# Patient Record
Sex: Male | Born: 1955 | Race: White | Hispanic: No | Smoking: Former smoker
Health system: Southern US, Community
[De-identification: ages and names within clinical notes are randomized; demographics above are authoritative.]

## PROBLEM LIST (undated history)

## (undated) DIAGNOSIS — F419 Anxiety disorder, unspecified: Secondary | ICD-10-CM

## (undated) DIAGNOSIS — I10 Essential (primary) hypertension: Secondary | ICD-10-CM

## (undated) DIAGNOSIS — I82409 Acute embolism and thrombosis of unspecified deep veins of unspecified lower extremity: Secondary | ICD-10-CM

## (undated) DIAGNOSIS — J439 Emphysema, unspecified: Secondary | ICD-10-CM

## (undated) DIAGNOSIS — D696 Thrombocytopenia, unspecified: Secondary | ICD-10-CM

## (undated) DIAGNOSIS — E119 Type 2 diabetes mellitus without complications: Secondary | ICD-10-CM

## (undated) DIAGNOSIS — G629 Polyneuropathy, unspecified: Secondary | ICD-10-CM

## (undated) DIAGNOSIS — R600 Localized edema: Secondary | ICD-10-CM

## (undated) DIAGNOSIS — J449 Chronic obstructive pulmonary disease, unspecified: Secondary | ICD-10-CM

## (undated) DIAGNOSIS — M1990 Unspecified osteoarthritis, unspecified site: Secondary | ICD-10-CM

## (undated) DIAGNOSIS — K529 Noninfective gastroenteritis and colitis, unspecified: Secondary | ICD-10-CM

## (undated) DIAGNOSIS — S5292XA Unspecified fracture of left forearm, initial encounter for closed fracture: Secondary | ICD-10-CM

## (undated) DIAGNOSIS — R918 Other nonspecific abnormal finding of lung field: Secondary | ICD-10-CM

## (undated) DIAGNOSIS — R5383 Other fatigue: Secondary | ICD-10-CM

## (undated) DIAGNOSIS — J40 Bronchitis, not specified as acute or chronic: Secondary | ICD-10-CM

## (undated) HISTORY — DX: Acute embolism and thrombosis of unspecified deep veins of unspecified lower extremity: I82.409

## (undated) HISTORY — DX: Essential (primary) hypertension: I10

## (undated) HISTORY — DX: Type 2 diabetes mellitus without complications: E11.9

## (undated) HISTORY — PX: MOUTH SURGERY: SHX715

## (undated) HISTORY — DX: Other fatigue: R53.83

## (undated) HISTORY — DX: Emphysema, unspecified: J43.9

## (undated) HISTORY — DX: Localized edema: R60.0

## (undated) HISTORY — PX: CHOLECYSTECTOMY: SHX55

## (undated) HISTORY — DX: Thrombocytopenia, unspecified: D69.6

## (undated) HISTORY — DX: Other nonspecific abnormal finding of lung field: R91.8

---

## 2000-11-25 ENCOUNTER — Encounter: Payer: Self-pay | Admitting: Family Medicine

## 2000-11-25 ENCOUNTER — Ambulatory Visit (HOSPITAL_COMMUNITY): Admission: RE | Admit: 2000-11-25 | Discharge: 2000-11-25 | Payer: Self-pay | Admitting: Family Medicine

## 2004-07-18 ENCOUNTER — Ambulatory Visit (HOSPITAL_COMMUNITY): Admission: RE | Admit: 2004-07-18 | Discharge: 2004-07-18 | Payer: Self-pay | Admitting: Family Medicine

## 2006-07-24 ENCOUNTER — Inpatient Hospital Stay (HOSPITAL_COMMUNITY): Admission: RE | Admit: 2006-07-24 | Discharge: 2006-07-25 | Payer: Self-pay | Admitting: Family Medicine

## 2006-07-24 ENCOUNTER — Ambulatory Visit: Payer: Self-pay | Admitting: Gastroenterology

## 2006-07-25 ENCOUNTER — Encounter (INDEPENDENT_AMBULATORY_CARE_PROVIDER_SITE_OTHER): Payer: Self-pay | Admitting: General Surgery

## 2007-07-04 ENCOUNTER — Inpatient Hospital Stay (HOSPITAL_COMMUNITY): Admission: EM | Admit: 2007-07-04 | Discharge: 2007-07-07 | Payer: Self-pay | Admitting: Emergency Medicine

## 2007-07-05 ENCOUNTER — Ambulatory Visit: Payer: Self-pay | Admitting: Internal Medicine

## 2008-11-25 ENCOUNTER — Ambulatory Visit (HOSPITAL_COMMUNITY): Admission: RE | Admit: 2008-11-25 | Discharge: 2008-11-25 | Payer: Self-pay | Admitting: Family Medicine

## 2009-02-18 ENCOUNTER — Ambulatory Visit (HOSPITAL_COMMUNITY): Admission: RE | Admit: 2009-02-18 | Discharge: 2009-02-18 | Payer: Self-pay | Admitting: General Surgery

## 2009-02-24 ENCOUNTER — Ambulatory Visit (HOSPITAL_COMMUNITY): Admission: RE | Admit: 2009-02-24 | Discharge: 2009-02-24 | Payer: Self-pay | Admitting: Internal Medicine

## 2009-02-24 ENCOUNTER — Inpatient Hospital Stay (HOSPITAL_COMMUNITY): Admission: EM | Admit: 2009-02-24 | Discharge: 2009-02-25 | Payer: Self-pay | Admitting: Emergency Medicine

## 2009-06-18 ENCOUNTER — Emergency Department (HOSPITAL_COMMUNITY): Admission: EM | Admit: 2009-06-18 | Discharge: 2009-06-18 | Payer: Self-pay | Admitting: Emergency Medicine

## 2009-08-02 ENCOUNTER — Encounter (HOSPITAL_COMMUNITY): Admission: RE | Admit: 2009-08-02 | Discharge: 2009-09-01 | Payer: Self-pay | Admitting: Oncology

## 2009-08-02 ENCOUNTER — Ambulatory Visit (HOSPITAL_COMMUNITY): Payer: Self-pay | Admitting: Oncology

## 2009-09-12 ENCOUNTER — Encounter (HOSPITAL_COMMUNITY): Admission: RE | Admit: 2009-09-12 | Discharge: 2009-10-12 | Payer: Self-pay | Admitting: Oncology

## 2009-09-19 ENCOUNTER — Ambulatory Visit (HOSPITAL_COMMUNITY): Payer: Self-pay | Admitting: Oncology

## 2009-10-12 ENCOUNTER — Encounter (HOSPITAL_COMMUNITY): Admission: RE | Admit: 2009-10-12 | Discharge: 2009-11-11 | Payer: Self-pay | Admitting: Oncology

## 2009-11-15 ENCOUNTER — Encounter (HOSPITAL_COMMUNITY): Admission: RE | Admit: 2009-11-15 | Discharge: 2009-12-02 | Payer: Self-pay | Admitting: Oncology

## 2009-11-15 ENCOUNTER — Ambulatory Visit (HOSPITAL_COMMUNITY): Payer: Self-pay | Admitting: Oncology

## 2009-12-05 ENCOUNTER — Encounter (HOSPITAL_COMMUNITY)
Admission: RE | Admit: 2009-12-05 | Discharge: 2010-01-04 | Payer: Self-pay | Source: Home / Self Care | Admitting: Oncology

## 2010-01-02 ENCOUNTER — Ambulatory Visit (HOSPITAL_COMMUNITY): Payer: Self-pay | Admitting: Oncology

## 2010-01-06 ENCOUNTER — Encounter (HOSPITAL_COMMUNITY)
Admission: RE | Admit: 2010-01-06 | Discharge: 2010-02-05 | Payer: Self-pay | Source: Home / Self Care | Admitting: Oncology

## 2010-02-07 ENCOUNTER — Encounter (HOSPITAL_COMMUNITY)
Admission: RE | Admit: 2010-02-07 | Discharge: 2010-03-09 | Payer: Self-pay | Source: Home / Self Care | Attending: Oncology | Admitting: Oncology

## 2010-03-14 ENCOUNTER — Ambulatory Visit (HOSPITAL_COMMUNITY)
Admission: RE | Admit: 2010-03-14 | Discharge: 2010-04-04 | Payer: Self-pay | Source: Home / Self Care | Attending: Oncology | Admitting: Oncology

## 2010-03-14 ENCOUNTER — Encounter (HOSPITAL_COMMUNITY)
Admission: RE | Admit: 2010-03-14 | Discharge: 2010-04-04 | Payer: Self-pay | Source: Home / Self Care | Attending: Oncology | Admitting: Oncology

## 2010-03-20 LAB — PROTIME-INR
INR: 1.5 — ABNORMAL HIGH (ref 0.00–1.49)
Prothrombin Time: 18.3 seconds — ABNORMAL HIGH (ref 11.6–15.2)

## 2010-03-26 ENCOUNTER — Encounter (HOSPITAL_COMMUNITY): Payer: Self-pay | Admitting: Oncology

## 2010-04-04 LAB — PROTIME-INR
INR: 2.66 — ABNORMAL HIGH (ref 0.00–1.49)
Prothrombin Time: 28.4 seconds — ABNORMAL HIGH (ref 11.6–15.2)

## 2010-04-05 ENCOUNTER — Encounter (HOSPITAL_COMMUNITY): Payer: Self-pay | Admitting: Oncology

## 2010-04-25 ENCOUNTER — Encounter (HOSPITAL_COMMUNITY): Payer: Medicaid Other | Attending: Oncology

## 2010-04-25 ENCOUNTER — Other Ambulatory Visit (HOSPITAL_COMMUNITY): Payer: Medicaid Other

## 2010-04-25 DIAGNOSIS — Z7901 Long term (current) use of anticoagulants: Secondary | ICD-10-CM | POA: Insufficient documentation

## 2010-04-25 DIAGNOSIS — Z86718 Personal history of other venous thrombosis and embolism: Secondary | ICD-10-CM | POA: Insufficient documentation

## 2010-04-25 DIAGNOSIS — I82409 Acute embolism and thrombosis of unspecified deep veins of unspecified lower extremity: Secondary | ICD-10-CM

## 2010-05-16 LAB — PROTIME-INR
INR: 2.01 — ABNORMAL HIGH (ref 0.00–1.49)
INR: 2.08 — ABNORMAL HIGH (ref 0.00–1.49)
INR: 2.53 — ABNORMAL HIGH (ref 0.00–1.49)
INR: 2.73 — ABNORMAL HIGH (ref 0.00–1.49)
INR: 3.18 — ABNORMAL HIGH (ref 0.00–1.49)
Prothrombin Time: 22.9 seconds — ABNORMAL HIGH (ref 11.6–15.2)
Prothrombin Time: 23.5 seconds — ABNORMAL HIGH (ref 11.6–15.2)
Prothrombin Time: 27.4 seconds — ABNORMAL HIGH (ref 11.6–15.2)
Prothrombin Time: 29 seconds — ABNORMAL HIGH (ref 11.6–15.2)
Prothrombin Time: 32.6 seconds — ABNORMAL HIGH (ref 11.6–15.2)

## 2010-05-17 LAB — PROTIME-INR
INR: 1.82 — ABNORMAL HIGH (ref 0.00–1.49)
INR: 2.5 — ABNORMAL HIGH (ref 0.00–1.49)
INR: 3.64 — ABNORMAL HIGH (ref 0.00–1.49)
Prothrombin Time: 21.2 seconds — ABNORMAL HIGH (ref 11.6–15.2)
Prothrombin Time: 27.1 seconds — ABNORMAL HIGH (ref 11.6–15.2)
Prothrombin Time: 36.2 seconds — ABNORMAL HIGH (ref 11.6–15.2)

## 2010-05-18 LAB — COMPREHENSIVE METABOLIC PANEL
ALT: 17 U/L (ref 0–53)
Albumin: 3.3 g/dL — ABNORMAL LOW (ref 3.5–5.2)
Alkaline Phosphatase: 40 U/L (ref 39–117)
Potassium: 3.7 mEq/L (ref 3.5–5.1)
Sodium: 135 mEq/L (ref 135–145)
Total Protein: 7 g/dL (ref 6.0–8.3)

## 2010-05-18 LAB — PROTIME-INR
INR: 1.55 — ABNORMAL HIGH (ref 0.00–1.49)
INR: 1.62 — ABNORMAL HIGH (ref 0.00–1.49)
INR: 2.46 — ABNORMAL HIGH (ref 0.00–1.49)
Prothrombin Time: 18.8 seconds — ABNORMAL HIGH (ref 11.6–15.2)
Prothrombin Time: 19.4 seconds — ABNORMAL HIGH (ref 11.6–15.2)
Prothrombin Time: 34.8 seconds — ABNORMAL HIGH (ref 11.6–15.2)

## 2010-05-18 LAB — CBC
HCT: 43.7 % (ref 39.0–52.0)
Platelets: 176 10*3/uL (ref 150–400)
RDW: 13.8 % (ref 11.5–15.5)
WBC: 7.1 10*3/uL (ref 4.0–10.5)

## 2010-05-19 LAB — PROTIME-INR
INR: 2.94 — ABNORMAL HIGH (ref 0.00–1.49)
Prothrombin Time: 30.7 seconds — ABNORMAL HIGH (ref 11.6–15.2)

## 2010-05-20 LAB — PROTIME-INR: INR: 1.74 — ABNORMAL HIGH (ref 0.00–1.49)

## 2010-05-21 LAB — PROTIME-INR
INR: 4.03 — ABNORMAL HIGH (ref 0.00–1.49)
Prothrombin Time: 25.3 seconds — ABNORMAL HIGH (ref 11.6–15.2)

## 2010-05-22 ENCOUNTER — Other Ambulatory Visit (HOSPITAL_COMMUNITY): Payer: Medicaid Other

## 2010-05-22 ENCOUNTER — Encounter (HOSPITAL_COMMUNITY): Payer: Medicaid Other | Attending: Oncology

## 2010-05-22 DIAGNOSIS — I82409 Acute embolism and thrombosis of unspecified deep veins of unspecified lower extremity: Secondary | ICD-10-CM

## 2010-05-22 DIAGNOSIS — Z86718 Personal history of other venous thrombosis and embolism: Secondary | ICD-10-CM | POA: Insufficient documentation

## 2010-05-22 DIAGNOSIS — Z7901 Long term (current) use of anticoagulants: Secondary | ICD-10-CM | POA: Insufficient documentation

## 2010-05-22 LAB — LUPUS ANTICOAGULANT PANEL
DRVVT: 58.8 secs — ABNORMAL HIGH (ref 36.2–44.3)
Lupus Anticoagulant: NOT DETECTED

## 2010-05-22 LAB — CARDIOLIPIN ANTIBODIES, IGG, IGM, IGA
Anticardiolipin IgA: 10 APL U/mL — ABNORMAL LOW (ref <22)
Anticardiolipin IgG: 4 GPL U/mL — ABNORMAL LOW (ref <23)

## 2010-05-22 LAB — DIFFERENTIAL
Eosinophils Absolute: 0.1 10*3/uL (ref 0.0–0.7)
Lymphocytes Relative: 20 % (ref 12–46)
Lymphs Abs: 2.9 10*3/uL (ref 0.7–4.0)
Monocytes Relative: 5 % (ref 3–12)
Neutrophils Relative %: 73 % (ref 43–77)

## 2010-05-22 LAB — BETA-2-GLYCOPROTEIN I ABS, IGG/M/A: Beta-2 Glyco I IgG: 0 G Units (ref <20)

## 2010-05-22 LAB — PROTIME-INR: Prothrombin Time: 20.6 seconds — ABNORMAL HIGH (ref 11.6–15.2)

## 2010-05-22 LAB — RETICULOCYTES
RBC.: 4.02 MIL/uL — ABNORMAL LOW (ref 4.22–5.81)
Retic Ct Pct: 2.4 % (ref 0.4–3.1)

## 2010-05-22 LAB — CBC
HCT: 44.1 % (ref 39.0–52.0)
MCV: 109.7 fL — ABNORMAL HIGH (ref 78.0–100.0)
RBC: 4.02 MIL/uL — ABNORMAL LOW (ref 4.22–5.81)
WBC: 14.5 10*3/uL — ABNORMAL HIGH (ref 4.0–10.5)

## 2010-05-22 LAB — IRON AND TIBC
Iron: 116 ug/dL (ref 42–135)
Saturation Ratios: 36 % (ref 20–55)
UIBC: 207 ug/dL

## 2010-05-22 LAB — FERRITIN: Ferritin: 455 ng/mL — ABNORMAL HIGH (ref 22–322)

## 2010-05-22 LAB — PROTEIN S, TOTAL: Protein S Ag, Total: 87 % (ref 70–140)

## 2010-05-22 LAB — PROTEIN C, TOTAL: Protein C, Total: 63 % — ABNORMAL LOW (ref 70–140)

## 2010-05-24 ENCOUNTER — Encounter (HOSPITAL_COMMUNITY): Payer: Medicaid Other

## 2010-05-24 ENCOUNTER — Other Ambulatory Visit (HOSPITAL_COMMUNITY): Payer: Medicaid Other

## 2010-05-24 DIAGNOSIS — I82409 Acute embolism and thrombosis of unspecified deep veins of unspecified lower extremity: Secondary | ICD-10-CM

## 2010-05-30 ENCOUNTER — Other Ambulatory Visit (HOSPITAL_COMMUNITY): Payer: Self-pay | Admitting: Oncology

## 2010-05-30 ENCOUNTER — Encounter (HOSPITAL_COMMUNITY): Payer: Medicaid Other

## 2010-05-30 ENCOUNTER — Ambulatory Visit (HOSPITAL_COMMUNITY): Payer: Medicaid Other | Admitting: Oncology

## 2010-05-30 ENCOUNTER — Other Ambulatory Visit (HOSPITAL_COMMUNITY): Payer: Medicaid Other

## 2010-05-30 DIAGNOSIS — I82409 Acute embolism and thrombosis of unspecified deep veins of unspecified lower extremity: Secondary | ICD-10-CM

## 2010-05-30 DIAGNOSIS — R918 Other nonspecific abnormal finding of lung field: Secondary | ICD-10-CM

## 2010-06-05 ENCOUNTER — Encounter (HOSPITAL_COMMUNITY): Payer: Medicaid Other

## 2010-06-05 ENCOUNTER — Ambulatory Visit (HOSPITAL_COMMUNITY)
Admission: RE | Admit: 2010-06-05 | Discharge: 2010-06-05 | Disposition: A | Discharge status: Home / Self Care | Payer: Medicaid Other | Source: Ambulatory Visit | Attending: Oncology | Admitting: Oncology

## 2010-06-05 ENCOUNTER — Encounter (HOSPITAL_COMMUNITY): Payer: Medicaid Other | Attending: Oncology

## 2010-06-05 DIAGNOSIS — R918 Other nonspecific abnormal finding of lung field: Secondary | ICD-10-CM

## 2010-06-05 DIAGNOSIS — I82409 Acute embolism and thrombosis of unspecified deep veins of unspecified lower extremity: Secondary | ICD-10-CM

## 2010-06-05 DIAGNOSIS — F17200 Nicotine dependence, unspecified, uncomplicated: Secondary | ICD-10-CM | POA: Insufficient documentation

## 2010-06-05 DIAGNOSIS — Z86718 Personal history of other venous thrombosis and embolism: Secondary | ICD-10-CM | POA: Insufficient documentation

## 2010-06-05 DIAGNOSIS — J984 Other disorders of lung: Secondary | ICD-10-CM | POA: Insufficient documentation

## 2010-06-05 DIAGNOSIS — Z7901 Long term (current) use of anticoagulants: Secondary | ICD-10-CM | POA: Insufficient documentation

## 2010-06-05 LAB — DIFFERENTIAL
Basophils Absolute: 0 10*3/uL (ref 0.0–0.1)
Basophils Relative: 0 % (ref 0–1)
Basophils Relative: 1 % (ref 0–1)
Eosinophils Relative: 2 % (ref 0–5)
Lymphocytes Relative: 23 % (ref 12–46)
Lymphs Abs: 2.7 10*3/uL (ref 0.7–4.0)
Monocytes Absolute: 0.5 10*3/uL (ref 0.1–1.0)
Monocytes Relative: 6 % (ref 3–12)
Monocytes Relative: 6 % (ref 3–12)
Neutro Abs: 5.1 10*3/uL (ref 1.7–7.7)
Neutro Abs: 5.1 10*3/uL (ref 1.7–7.7)
Neutrophils Relative %: 68 % (ref 43–77)
Neutrophils Relative %: 72 % (ref 43–77)

## 2010-06-05 LAB — CBC
HCT: 36.8 % — ABNORMAL LOW (ref 39.0–52.0)
Hemoglobin: 15.3 g/dL (ref 13.0–17.0)
MCHC: 32.9 g/dL (ref 30.0–36.0)
Platelets: 125 10*3/uL — ABNORMAL LOW (ref 150–400)
RBC: 4.34 MIL/uL (ref 4.22–5.81)
RBC: 3.6 MIL/uL — ABNORMAL LOW (ref 4.22–5.81)
RDW: 17.5 % — ABNORMAL HIGH (ref 11.5–15.5)
WBC: 7.5 10*3/uL (ref 4.0–10.5)
WBC: 9.1 10*3/uL (ref 4.0–10.5)

## 2010-06-05 LAB — BASIC METABOLIC PANEL
Calcium: 8.8 mg/dL (ref 8.4–10.5)
Chloride: 97 mEq/L (ref 96–112)
Creatinine, Ser: 0.81 mg/dL (ref 0.4–1.5)
GFR calc Af Amer: >60 mL/min (ref >60)

## 2010-06-05 LAB — STOOL CULTURE

## 2010-06-05 LAB — FECAL LACTOFERRIN, QUANT

## 2010-06-05 LAB — COMPREHENSIVE METABOLIC PANEL
AST: 28 U/L (ref 0–37)
Albumin: 3 g/dL — ABNORMAL LOW (ref 3.5–5.2)
Albumin: 2.8 g/dL — ABNORMAL LOW (ref 3.5–5.2)
Alkaline Phosphatase: 39 U/L (ref 39–117)
Alkaline Phosphatase: 36 U/L — ABNORMAL LOW (ref 39–117)
BUN: 5 mg/dL — ABNORMAL LOW (ref 6–23)
BUN: 6 mg/dL (ref 6–23)
GFR calc Af Amer: >60 mL/min (ref >60)
Potassium: 4.1 mEq/L (ref 3.5–5.1)
Potassium: 4 mEq/L (ref 3.5–5.1)
Sodium: 133 mEq/L — ABNORMAL LOW (ref 135–145)
Sodium: 139 mEq/L (ref 135–145)
Total Protein: 6.7 g/dL (ref 6.0–8.3)
Total Protein: 6.1 g/dL (ref 6.0–8.3)

## 2010-06-05 LAB — APTT
aPTT: 29 seconds (ref 24–37)
aPTT: 35 seconds (ref 24–37)

## 2010-06-05 LAB — PROTIME-INR
INR: 2.37 — ABNORMAL HIGH (ref 0.00–1.49)
INR: 0.95 (ref 0.00–1.49)
INR: 0.97 (ref 0.00–1.49)
Prothrombin Time: 26 seconds — ABNORMAL HIGH (ref 11.6–15.2)

## 2010-06-05 LAB — CLOSTRIDIUM DIFFICILE EIA

## 2010-06-05 LAB — IRON AND TIBC: UIBC: 239 ug/dL

## 2010-06-05 LAB — OVA AND PARASITE EXAMINATION

## 2010-06-08 ENCOUNTER — Other Ambulatory Visit (HOSPITAL_COMMUNITY): Payer: Self-pay | Admitting: Oncology

## 2010-06-08 DIAGNOSIS — F17200 Nicotine dependence, unspecified, uncomplicated: Secondary | ICD-10-CM

## 2010-06-08 DIAGNOSIS — R911 Solitary pulmonary nodule: Secondary | ICD-10-CM

## 2010-06-16 ENCOUNTER — Encounter (HOSPITAL_COMMUNITY): Payer: Medicaid Other

## 2010-06-16 DIAGNOSIS — I82409 Acute embolism and thrombosis of unspecified deep veins of unspecified lower extremity: Secondary | ICD-10-CM

## 2010-07-04 ENCOUNTER — Encounter (HOSPITAL_COMMUNITY): Payer: Medicaid Other | Attending: Oncology

## 2010-07-04 ENCOUNTER — Encounter (HOSPITAL_COMMUNITY): Payer: Medicaid Other | Attending: Oncology | Admitting: Oncology

## 2010-07-04 DIAGNOSIS — R911 Solitary pulmonary nodule: Secondary | ICD-10-CM

## 2010-07-04 DIAGNOSIS — Z7901 Long term (current) use of anticoagulants: Secondary | ICD-10-CM | POA: Insufficient documentation

## 2010-07-04 DIAGNOSIS — Z86718 Personal history of other venous thrombosis and embolism: Secondary | ICD-10-CM | POA: Insufficient documentation

## 2010-07-04 DIAGNOSIS — I82409 Acute embolism and thrombosis of unspecified deep veins of unspecified lower extremity: Secondary | ICD-10-CM

## 2010-07-04 DIAGNOSIS — J449 Chronic obstructive pulmonary disease, unspecified: Secondary | ICD-10-CM

## 2010-07-18 ENCOUNTER — Other Ambulatory Visit (HOSPITAL_COMMUNITY): Payer: Self-pay | Admitting: Oncology

## 2010-07-18 ENCOUNTER — Encounter (HOSPITAL_COMMUNITY): Payer: Medicaid Other

## 2010-07-18 DIAGNOSIS — I82409 Acute embolism and thrombosis of unspecified deep veins of unspecified lower extremity: Secondary | ICD-10-CM

## 2010-07-18 LAB — PROTIME-INR: INR: 2.78 — ABNORMAL HIGH (ref 0.00–1.49)

## 2010-07-18 NOTE — Op Note (Signed)
NAME:  Wesley Ho, Wesley Ho NO.:  [id number]   MEDICAL RECORD NO.:  [id number]          PATIENT TYPE:  INP   LOCATION:  A327                          FACILITY:  [facility]   PHYSICIAN:  Dalia Heading, M.D.  DATE OF BIRTH:  06/03/1955   DATE OF PROCEDURE:  DATE OF DISCHARGE:                               OPERATIVE REPORT   PREOPERATIVE DIAGNOSIS:  Cholecystitis, cholelithiasis.   POSTOPERATIVE DIAGNOSIS:  Cholecystitis, cholelithiasis,  choledocholithiasis.   OPERATION PERFORMED:  Laparoscopic cholecystectomy with common bile duct  exploration.   SURGEON:  Dalia Heading, M.D.   ANESTHESIA:  General endotracheal.   INDICATIONS:  The patient is a 55 year old white male who presents with  cholecystitis secondary to cholelithiasis.  He was also noted to have  elevated liver enzyme testes.  The risks and benefits of the procedure  including, bleeding, infection, hepatobiliary injury, the possibility of  an open procedure were fully explained to the patient.  He gave informed  consent.   PROCEDURE:  The patient was placed in supine position.  After induction  of general endotracheal anesthesia, the abdomen was prepped and draped  using the usual sterile technique with  Betadine.  Surgical site  confirmation was performed.   A supraumbilical incision was made down to the fascia.  A Veress needle  was introduced into the abdominal cavity and confirmation of placement  was done using the saline drop test.  The abdomen was then insufflated  to 16 mmHg pressure.  An 11 mm trocar was introduced into the abdominal  cavity under direct visualization without difficulty.  The patient was  placed in reverse Trendelenburg position and additional 11 mm trocar was  placed to the epigastric region. 5 mm trocars were placed in the right  upper quadrant, right flank regions.  The liver was inspected and noted  to be within normal limits.  The gallbladder was retracted superiorly  and  laterally. The dissection was begun around the infundibulum of the  gallbladder.  The cystic duct was first identified.  The junction to the  infundibulum was fully identified.  A single endo cup was placed  proximally on the cystic duct.  An incision was made into the cystic  duct and a cholangiogram was performed under digital fluoroscopy.  The  patient was noted to have a distal common bile duct filling defect.  Laparoscopically, a basket was placed multiple times past this region.  Completion cholangiograms reveals the filling defect to be gone.  Dye  flowed freely into the duodenum.  The system was flushed with saline and  the cholangio catheter removed.  Multiple endoclips were placed distally  on the cystic duct. The cystic duct was divided.  The cystic artery was  likewise ligated and divided.  The gallbladder was freed away from the  gallbladder fossa using Bovie electrocautery.  The gallbladder was  delivered through the upper gastric trocar site using EndoCatch bag.  The gallbladder fossa was inspected and no abnormal bleeding or bile  leakage was noted.  Surgicel was placed in the gallbladder fossa.  All  fluid and  air were then evacuated from the abdominal cavity prior to  removal of the trocars.  All wounds were irrigated with saline.  All  wounds were injected with 0.5% Sensorcaine.  The supraumbilical fascia  as well as epigastric fascia was reapparoximated using 0-Vicryl  interrupted sutures.  All skin incisions were closed using staples.  Betadine ointment dry sterile dressings were applied.  All tape and  needle counts were correct at the end of the procedure.  The patient was  extubated in the operating room and went back to recovery room awake and  in stable condition.   COMPLICATIONS:  None.   SPECIMENS:  Gallbladder with stones.   ESTIMATED BLOOD LOSS:  Minimal.      Dalia Heading, M.D.  Electronically Signed     MAJ/MEDQ  D:  07/25/2006  T:   07/25/2006  Job:  [id number]   cc:   Dalia Heading, M.D.  Fax: [phone]   Kassie Mends, M.D.  [address]  [place] , [place] [address]   Patrica Duel, M.D.  Fax: [phone]

## 2010-07-18 NOTE — Discharge Summary (Signed)
NAMENICOLI, NARDOZZI NO.:  [id number]   MEDICAL RECORD NO.:  [id number]          PATIENT TYPE:  INP   LOCATION:  A202                          FACILITY:  [facility]   PHYSICIAN:  Dorris Singh, DO    DATE OF BIRTH:  [birth year]   DATE OF ADMISSION:  07/04/2007  DATE OF DISCHARGE:  05/04/2009LH                               DISCHARGE SUMMARY   ADMISSION DIAGNOSES:  1. Abdominal pain.  2. Acute pancreatitis.  3. Leukocytosis.  4. Hyperglycemia.   DISCHARGE DIAGNOSES:  1. Acute pancreatitis.  2. Leukocytosis.   PRIMARY CARE PHYSICIAN:  Patrica Duel, M.D.   CONSULTS THAT WERE MADE:  [facility], Dr.  Jena Gauss.   TESTING THAT WAS DONE:  1. On Jul 04, 2007, he had a CT of the abdomen which demonstrated:  1.      Suspect acute pancreatitis involving the head and uncinate process      of the pancreas, no evidence of pancreatic sclerosis, pseudocyst      formation, or abscess.  2.  Status post cholecystectomy.  3.      Pulmonary nodule in the right lung.  Follow-up CT of the chest as      recommended in 3 months.  2. He also had a CT of the pelvis, which demonstrated no acute      findings.   His H&P was done by Dr. Skeet Latch.  Please refer.  To summarize,  the patient is a 55 year old Caucasian male who presented with abdominal  pain.  He was found to have pancreatitis.  He was admitted to the  service of [facility]. The patient was made n.p.o., given IV hydration as  well as IV antibiotics.  He continued to progress.  He was also placed  on pain management.  The patient continued to do well after 24 hours of  this therapy.  At that point in time, it was determined that he could  probably try some clear liquids.  He advanced with clear liquids without  any problem.  He was able then to advance from clear liquids to a solid  diet by Jul 07, 2007.  At that point in time, he has had no abdominal  pain.  We will go ahead and discharge the  patient to home.   MEDICATIONS HE WILL BE DISCHARGED ON:  1. Kapidex 60 mg 1 p.o. daily.  2. Lisinopril, there is no dose, p.o. daily.  3. Xanax 1 mg 3 times a day.   NEW MEDICATIONS:  1. Darvocet-N 100, 1 p.o. q. 4-6 h.  2. Phenergan 12.5 mg p.o. t.i.d.   He will be recommended to stay on a lowfat diet and low-cholesterol diet  as well.  He will be recommended to follow up with Dr. Jena Gauss in 4 weeks,  and also with Dr. Nobie Putnam in 1 week.  He is recommend to have an EUS  in 3 months with Dr. Jena Gauss, and based on a result from his chest x-ray  it is recommend that he has a follow-up chest x-ray for nodules seen.  Recommend a 26-month followup,  to have the PCP to schedule.   CONDITION:  Stable.   DISPOSITION:  Will be to home.      Dorris Singh, DO  Electronically Signed     CB/MEDQ  D:  07/07/2007  T:  07/07/2007  Job:  [id number]

## 2010-07-18 NOTE — Consult Note (Signed)
NAMEYANG, RACK NO.:  [id number]   MEDICAL RECORD NO.:  [id number]          PATIENT TYPE:  INP   LOCATION:  A327                          FACILITY:  [facility]   PHYSICIAN:  Kassie Mends, M.D.      DATE OF BIRTH:  23-May-1955   DATE OF CONSULTATION:  07/24/2006  DATE OF DISCHARGE:                                 CONSULTATION   REASON FOR CONSULTATION:  Right upper quadrant abdominal pain.   HISTORY OF PRESENT ILLNESS:  Mr. Beale is a 55 year old male with right  upper quadrant pain that began on Saturday.  He was working in the yard  and sat down to eat salad and chips.  The abdominal pain persisted on  Sunday but he was able to go to church.  On Monday he went to work but  had to leave because he was having abdominal pain.  On Tuesday he left  work because he felt like he had a fist in his abdomen and he could not  get it out.  On Wednesday he was seen in the office by Dr. Nobie Putnam  because he could not get any relief from his pain.  He tried to burp and  he could not get any relief.  He tried to pass gas from below and he  could not get any relief.  He eventually would not eat anything because  of this feeling of fullness in his abdomen.  He felt like he was  bloated.  He was not having any vomiting.  He was nauseated a couple of  times.  The highest his temperature got was 100.5.  He is not having any  difficulty swallowing.  He is not a person who has a lot of heartburn  and indigestion.  He denies any diarrhea.  He does not have any problem  with constipation or blood in his stool.  The last time he saw any  black, tarry stools was about six months ago.  He does use Alka Seltzer  Plus regularly for sinus problems.  He uses Meloxicam on a regular basis  for osteoarthritis.  He denies any use of aspirin or BC's or Goody  powders.  Currently, he feels only half inflated as compared to before.  He is now starting to feel more hungry.  He tolerated his dinner on  today.  He was seen in the office today and his bilirubin was elevated  and his alkaline phosphatase was normal.   PAST MEDICAL HISTORY:  1. Hypertension.  2. Anxiety.  3. Sinus trouble.  4. Osteoarthritis.   PAST SURGICAL HISTORY:  None.   ALLERGIES:  No known drug allergies.   MEDICATIONS:  1. Xanax.  2. Allegra.  3. Lisinopril.   FAMILY HISTORY:  He denies any history of colon cancer or colon polyps.   SOCIAL HISTORY:  He is married and drinks alcohol two times a month.  He  smokes.  He works for a [employer].   REVIEW OF SYSTEMS:  Per the HPI; otherwise, all systems are negative.   PHYSICAL EXAMINATION:  Temperature 97.8, blood  pressure 131/90, pulse  90, respiratory rate 20.  Generally, he is in no apparent distress,  alert and oriented x4.  His HEENT exam is atraumatic, normocephalic.  Pupils equal and reactive to light.  Mouth:  No oral lesions.  Posterior  pharynx without erythema or exudate.  His neck has full range of motion.  No lymphadenopathy.  His lungs are clear to auscultation bilaterally.  His cardiovascular exam is regular rhythm.  No murmur.  Normal S1 and  S2.  Abdomen:  Bowel sounds are present.  Soft.  Mild tenderness to  palpation in the epigastrium without rebound or guarding.  Other  quadrants are nontender.  His extremities are without cyanosis,  clubbing, or edema.  Neuro:  He has no focal neurologic deficits.   RADIOGRAPHIC STUDIES:  Ultrasound on May 21st reveals gallstones with  tenderness over the gallbladder but no evidence of gallbladder wall  thickening and mild intrahepatic biliary duct dilation with a common  bile duct of 5.5 mm.   ASSESSMENT:  Mr. Iten is a 55 year old male with biliary colic and no  evidence of distal common bile duct stone on ultrasound but with  reportedly mildly elevated bilirubin and transaminases.  It appears that  he probably passed a gallstone.  Thank you for allowing me to see Mr.  Carolyne Fiscal in consultation.   My recommendations follow.   RECOMMENDATIONS:  1. Agree with checking hepatic function panel in the a.m.  2. If his hepatic function panel indicates evidence of biliary      obstruction, then an ERCP will be performed prior to his      laparoscopic cholecystectomy.  Otherwise, agree with laparoscopic      cholecystectomy with intraoperative  cholangiogram if indicated.  3. Agree with continued antibiotics until the results of his morning      hepatic function panel are known.  4. Agree with IV pain control and antiemetics.      Kassie Mends, M.D.  Electronically Signed     SM/MEDQ  D:  07/24/2006  T:  07/25/2006  Job:  [id number]

## 2010-07-18 NOTE — Consult Note (Signed)
NAMEWALLACE, GAPPA                ACCOUNT NO.:  [id number]   MEDICAL RECORD NO.:  [id number]          PATIENT TYPE:  INP   LOCATION:  A202                          FACILITY:  [facility]   PHYSICIAN:  R. Roetta Sessions, M.D. DATE OF BIRTH:  [birth year]   DATE OF CONSULTATION:  07/05/2007  DATE OF DISCHARGE:                                 CONSULTATION   REASON FOR CONSULTATION:  Acute pancreatitis.   HISTORY OF PRESENT ILLNESS:  Mr. Tigges is a 55 year old Caucasian male.  He was admitted with acute pancreatitis.  This is believed to be his  second episode.  He tells me approximately 3 weeks ago he had been out  with his daughter.  He awakened the next morning with having significant  epigastric pain.  This lasted for a couple of days.  He had nausea as  well and was unable to eat just about anything except for some soup.  He  complained of fatigue.  He was then seen as an outpatient by Dr.  Regino Schultze.  He was put on Kapidex for presumed gastritis which did seem to  help.  He gradually recovered until about 3 days ago.  He then developed  severe epigastric pain which was throbbing in nature and did radiate  through to his back.  He describes the pain as 7/10 on pain scale.  He  did have nausea as well as vomiting with the pain as well as low-grade  fever and chills.  He notes that his weight has remained stable.  His  bowel movements have been regular.  He denies any rectal bleeding or  melena.  He denies any previous history of GERD.  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  More recently he only consumes alcohol 4-5 times per year.  He has not  consumed any recently.  He denies any drug use.  He denies any new  medications except for starting lisinopril about 6 months ago and had  his dose increased about 4 months ago.  He had a white blood cell count  of 20.6 upon admission and it is down to 16.9 today.  Amylase is 340 and  lipase 142.  He  had a CT scan of the abdomen and pelvis with contrast  yesterday which showed edema and peripancreatic inflammatory changes  involving the head and uncinate process of the pancreas.  There was no  evidence of necrosis, pseudocyst or bile duct dilatation.  His previous  bout of pancreatitis was in May of 2008 felt to be due to  choledocholithiasis.  He had a laparoscopic cholecystectomy with CBD  exploration which revealed a single stone in the distal CBD which was  removed via laparoscopy.   He has normal LFTs except for an albumin of 2.8 and has a normal met-7.  Hemoglobin is normal.  Hematocrit 37.9.  He denies any history of  hypertriglyceridemia.   PAST MEDICAL AND SURGICAL HISTORY:  See HPI for history of  cholelithiasis, gallstone pancreatitis and laparoscopic cholecystectomy,  hypertension, anxiety, chronic sinus  headaches, osteoarthritis.   MEDICATIONS PRIOR TO ADMISSION:  1. Xanax 1 mg t.i.d.  2. Kapidex 60 mg daily.  3. Lisinopril unknown dose daily.  4. Alka-Seltzer t.i.d. p.r.n.  5. Allegra p.r.n.   ALLERGIES:  No known drug allergies.   FAMILY HISTORY:  There is no known family history of pancreatitis or  colorectal carcinoma or chronic GI problems.  Mother age 57 has history  of diabetes mellitus.  He is unaware of his father's history.  He has  one healthy half-brother.   SOCIAL HISTORY:  Mr. Poythress has been married for 21 years.  He has two  healthy children.  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  He  consumes alcohol about 4-5 times per year in small quantities, previous  history of heavy alcohol as stated above.  He has been a smoker with an  approximate 30+ pack-year history of tobacco abuse.  He denies any drug  use.   REVIEW OF SYSTEMS:  See HPI.  PULMONARY:  He has had a nonproductive  cough which he describes as a smoker's cough.  No acute shortness of  breath.  He also notes a history of possible pulmonary nodule which has  been followed over  many years ago.  Otherwise negative review of  systems, see HPI.   PHYSICAL EXAM:  VITAL SIGNS:  Temperature 98 degrees, pulse 99,  respirations 20, blood pressure 141/80, 88.3 kg, 69 inches, O2 sat 88%  on 2 liters via nasal cannula.  GENERAL:  Mr. Bart is a well-developed, well-nourished Caucasian male  in no acute distress.  He is alert, oriented, pleasant and cooperative.  HEENT.  Sclerae clear, nonicteric.  Conjunctivae pink.  Oropharynx pink  and moist without any lesions.  NECK:  Supple without any mass, thyromegaly.  CHEST:  Heart regular rate and rhythm.  Normal S1-S2.  No murmurs,  clicks, rubs or gallops.  LUNGS:  With emphysematous changes bilaterally but no acute adventitious  sounds.  ABDOMEN:  Abdomen has positive bowel sounds x4.  There are no bruits  auscultated.  His abdomen is relatively soft.  He has very mild  epigastric and upper abdominal tenderness on deep palpation.  There is  no rebound tenderness or guarding.  Unable to palpate  hepatosplenomegaly.  EXTREMITIES:  He does have clubbing.  There is no edema or cyanosis.   LABORATORY STUDIES:  See HPI.  INR is 1, platelet count 215.   IMPRESSION:  Mr. Gunkel is a 55 year old male with a second episode of  pancreatitis.  Previous episode was May of 2008 and felt to be due to  choledocholithiasis.  He is status post laparoscopic cholecystectomy.  He denies any alcohol use, therefore, I do not feel this is the culprit.  There is no given history of hypertriglyceridemia.  He has been started  on lisinopril which rarely can be linked to pancreatitis, has been on  this for about 6 months.  The etiology of his pancreatitis could include  the above as well as a small malignancy or bile duct stricture or  idiopathic pancreatitis.   PLAN:  1. Rest.  2. Aggressive IV fluids, pain control and antiemetics.  3. N.p.o.  4. Will allow his pancreas to cool off then he is going to need      further imaging via either EUS  or pancreatic protocol CT in about      10 weeks.  5. He is going to need followup on his right middle lobe  pulmonary      nodule per primary care physician.  6. He should remain on proton pump inhibitor.   Thank you for allowing [place] to participate in the care of Mr. Carolyne Fiscal.      Lorenza Burton, N.P.      Jonathon Bellows, M.D.  Electronically Signed    KJ/MEDQ  D:  07/05/2007  T:  07/05/2007  Job:  [id number]   cc:   [facility]

## 2010-07-18 NOTE — Group Therapy Note (Signed)
NAMELONDON, TARNOWSKI NO.:  [id number]   MEDICAL RECORD NO.:  [id number]          PATIENT TYPE:  INP   LOCATION:  A202                          FACILITY:  [facility]   PHYSICIAN:  Dorris Singh, DO    DATE OF BIRTH:  1955-04-13   DATE OF PROCEDURE:  07/05/2007  DATE OF DISCHARGE:                                 PROGRESS NOTE   Patient seen, doing well today.  He does not have any problems.  He  states that his pain is actually better.  I explained to him the process  of pancreatitis and answered any questions that he had.   PHYSICAL EXAMINATION:  VITAL SIGNS:  Temperature 98, pulse 91,  respirations 20, blood pressure 141/80.  GENERAL:  The patient is a 55 year old Caucasian male who is well  developed, well nourished, in no acute distress.  HEART:  Regular rate and rhythm.  LUNGS:  Clear to auscultation bilaterally.  ABDOMEN:  There is some epigastric tenderness.  However, it is mild.  EXTREMITIES:  Positive pulses.  No ecchymosis, edema, or cyanosis.   LABORATORIES FOR TODAY:  His white count is 16.9, hemoglobin 13,  hematocrit 37.9, and platelet count of 215.  His INR is 1.  Sodium is  135, potassium is 4, chloride is 100, CO2 is 28, glucose is 107, BUN is  5.  His lipase is 142, and his amylase is 340.   ASSESSMENT AND PLAN:  1. Acute pancreatitis.  2. Leukocytosis, which is resolving.   PLAN:  Will be to continue with current management GI has seen him.  Will go ahead and keep him n.p.o., as well as continue with IV hydration  and pain management and antiemetics, and await for any further  recommendations that GI may have. Will continue to monitor the patient.      Dorris Singh, DO  Electronically Signed     CB/MEDQ  D:  07/05/2007  T:  07/05/2007  Job:  [id number]

## 2010-07-18 NOTE — Discharge Summary (Signed)
NAME:  Wesley Ho, HOEFLE NO.:  [id number]   MEDICAL RECORD NO.:  [id number]          PATIENT TYPE:  INP   LOCATION:  A327                          FACILITY:  [facility]   PHYSICIAN:  Dalia Heading, M.D.  DATE OF BIRTH:  10-22-1955   DATE OF ADMISSION:  07/24/2006  DATE OF DISCHARGE:  05/22/2008LH                               DISCHARGE SUMMARY   HOSPITAL COURSE:  The patient is a 55 year old white male who was  admitted by Dr. Patrica Duel for workup of right upper quadrant  abdominal pain, nausea, vomiting.  Ultrasound of the gallbladder  revealed cholelithiasis.  Liver enzyme tests were elevated, suggestive  of probable or possible choledocholithiasis.  Surgery consultation was  obtained, and the patient went to the operating room on Jul 25, 2006,  and underwent laparoscopic cholecystectomy with common bile duct  exploration.  A single stone was noted in the distal common bile duct  and this was removed laparoscopically.  He tolerated the procedure well.  Postoperative course has been unremarkable.  His diet was advanced  without difficulty.  The patient is being discharged home on Jul 25, 2006, in good improving condition.   DISCHARGE INSTRUCTIONS:  The patient is to follow up Dr. Franky Macho on  Aug 01, 2006.   DISCHARGE MEDICATIONS:  1. Minoxidil for arthritis.  2. Lisinopril 20 mg p.o. daily.  3. Xanax 1 mg p.o. q.6 h p.r.n.  4. Vicodin 1-2 tablets p.o. q.4 h p.r.n. pain   PRINCIPAL DIAGNOSES:  1. Cholecystitis, cholelithiasis.  2. Choledocholithiasis.  3. Hypertension.  4. Arthritis.   PRINCIPAL PROCEDURE:  Laparoscopic cholecystectomy with common bile duct  exploration on Jul 25, 2006.      Dalia Heading, M.D.  Electronically Signed     MAJ/MEDQ  D:  07/25/2006  T:  07/26/2006  Job:  [id number]   cc:   Patrica Duel, M.D.  Fax: [phone]

## 2010-07-18 NOTE — H&P (Signed)
NAMEPATTON, RABINOVICH NO.:  [id number]   MEDICAL RECORD NO.:  [id number]          PATIENT TYPE:  INP   LOCATION:  A327                          FACILITY:  [facility]   PHYSICIAN:  Patrica Duel, M.D.    DATE OF BIRTH:  Jul 25, 1955   DATE OF ADMISSION:  07/24/2006  DATE OF DISCHARGE:  LH                              HISTORY & PHYSICAL   CHIEF COMPLAINT:  Abdominal pain and bloating.   HISTORY OF PRESENT ILLNESS:  This is a 55 year old male with a  longstanding history of hypertension, mild anxiety disorder and tobacco  use.  He has enjoyed good health all his life.   The patient presented to the office with a 4-day history of postprandial  epigastric and right upper quadrant abdominal pain associated with  bloating.  He had no precedent symptoms whatsoever.   PHYSICAL EXAMINATION:  Remarkable for some epigastric and right upper  quadrant tenderness.  There was no icterus apparent.   The patient was sent for a STAT ultrasound of the abdomen as well as lab  work.  He was found to have multiple gallstones and intrahepatic biliary  ductal dilatation.  His lab review was significant for leukocytosis with  a white count of 15,000 with left shift.  Chemistries revealing  hyperbilirubinemia, as well as mild elevation of his transaminases.   Given the above information, it has become apparent the patient has an  acute biliary process, possibly cholecystitis versus a common duct  stone.  He is admitted for further evaluation and probable surgical  intervention of acute gallbladder/biliary disease.   There is no history of headache, neurologic deficits, chest pain,  shortness of breath, nausea, vomiting, diarrhea or vomiting.  He has had  some intermittent nausea.  There has been no melena, hematemesis,  hematochezia or genitourinary symptoms.   CURRENT MEDICATIONS:  1. Xanax 1 mg q.i.d. p.r.n.  2. Allegra p.r.n.  3. Lisinopril 20 mg p.o. daily.   ALLERGIES:  None  known.   PAST HISTORY:  As noted above.   PAST SURGERIES:  No previous surgeries.   FAMILY HISTORY:  Significant for fibromyalgia in his mother and  prostatism in his dad.  No other significant history documented.   SOCIAL HISTORY:  Smokes approximately two packs of cigarettes per day  and rarely uses alcohol.  There is no history of substance abuse.   PHYSICAL EXAMINATION:  GENERAL:  This is a very pleasant alert male who  is in no acute distress at this time.  He is well hydrated.  VITAL SIGNS:  Temperature 98.2, pulse 96 and regular, BP 120/78,  respirations 18 and unlabored.  HEENT:  Normocephalic and atraumatic.  There is no scleral icterus.  Mucous membranes are moist.  NECK:  Supple.  No bruits, thyromegaly or lymphadenopathy.  LUNGS:  Clear to auscultation and percussion.  HEART:  Heart sounds are normal without murmurs, rubs or gallops.  ABDOMEN:  The abdomen reveals mild hypertympani in the epigastrium.  There is right upper quadrant tenderness present.  Murphy sign is  borderline.  Bowel sounds are intact.  EXTREMITIES:  No clubbing, cyanosis or edema.  NEUROLOGIC:  Within normal limits.   ASSESSMENT:  Cholelithiasis with gallbladder syndrome, possible common  duct involvement.   PLAN:  Surgical consultation as soon as possible.  Consider ERCP  preoperatively per Dr. Lovell Sheehan.  Will follow and treat expectantly.      Patrica Duel, M.D.  Electronically Signed     MC/MEDQ  D:  07/24/2006  T:  07/24/2006  Job:  [id number]

## 2010-07-18 NOTE — H&P (Signed)
NAMEMARKEY, DEADY NO.:  [id number]   MEDICAL RECORD NO.:  [id number]          PATIENT TYPE:  INP   LOCATION:  A202                          FACILITY:  [facility]   PHYSICIAN:  Skeet Latch, DO    DATE OF BIRTH:  1955/08/31   DATE OF ADMISSION:  07/04/2007  DATE OF DISCHARGE:  LH                              HISTORY & PHYSICAL   PRIMARY CARE PHYSICIAN:  Dr. Nobie Putnam.   CHIEF COMPLAINT:  Abdominal pain.   HISTORY OF PRESENT ILLNESS:  This is a 55 year old Caucasian male who  presented with abdominal pain.  The patient states that he was out a few  weeks prior with his daughter, the next morning he had significant  abdominal pain.  This pain lasted for a few days, it was associated with  nausea and unable to keep any foods down.  Patient is also complaining  of fatigue.  He went to see his primary care physician who placed him on  medication for gastritis but did not seem to help.  The patient states  that he improved for a few days then began again to have severe  abdominal pain.  He states it was throbbing in nature with no radiation.  Patient denied any hematemesis or bleeding or melena.  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  Patient  was started on lisinopril approximately 6 months ago and had his dose  increased approximately 4 months ago.   PAST MEDICAL HISTORY:  1. Cholelithiasis.  2. Gallstone pancreatitis.  3. Hypertension.  4. Anxiety.  5. Chronic sinusitis.  6. Osteoarthritis.   PAST SURGICAL HISTORY:  Laparoscopic cholecystectomy.   MEDICATIONS:  1. Xanax 1 mg three times a day.  2. Adipex 60mg  daily.  3. Lisinopril unknown dose.  4. Alka Seltzer t.i.d. p.r.n.  5. Allegra p.r.n.   ALLERGIES:  No known drug allergies.   FAMILY HISTORY:  Diabetes mellitus.   SOCIAL HISTORY:  1. He has been married for 21 years.  2. He has 2 children.  3. He states that he drinks approximately 4 to 5 times a year.  4. __________ was a previous heavy drinker.  5. He has a 30-pack year history of tobacco abuse.  6. Denies any illicit drug use.   REVIEW OF SYSTEMS:  RESPIRATORY:  Nonproductive cough, no shortness of  breath.  CARDIOVASCULAR:  No chest pain or palpitations.  GI:  Positive  for abdominal pain.  Otherwise, all other systems were negative.   PHYSICAL EXAM:  GENERAL:  He is well-developed, well-hydrated, well-  nourished, no acute distress.  VITAL SIGNS:  Temperature 98.0, pulse 96, respirations 20, blood  pressure 149/85, he is sating 92% on room air.  HEART:  Regular rate and rhythm, no murmurs, rubs or gallops.  LUNGS:  Clear to auscultation bilaterally, no rales, rhonchi or  wheezing.  ABDOMEN:  He has some epigastric tenderness with mild positive bowel  sounds, no rigidity or guarding.  EXTREMITIES:  No clubbing, cyanosis or edema, positive pulses.   LABS:  White count  of 16.9, hemoglobin 13, hematocrit 37.9, platelet  count 615,000, INR 1.  Sodium 135, potassium 4, chloride 100, CO2 28,  glucose is 107, BUN is 5, lipase is 142, amylase 340.  A CT of his  abdomen suspect for acute pancreatitis all in the head and also in the  process of the pancreas.  No evidence of pancreatic necrosis or  pseudocyst or abscess.  Next, pulmonary nodule of the right middle lobe.  Followup CT of his chest is recommended in 3 months.  A CT of his pelvis  showed no acute findings.   ASSESSMENT:  1. Abdominal pain.  2. Acute pancreatitis.  3. Leukocytosis.  4. Hyperglycemia.   PLAN:  1. The patient will be admitted to the service of [facility].  2. For his abdominal pain, IV pain medication and IV antiemetics will      be instituted.  3. For his pancreatitis, Gastroenterology has been consulted, I      appreciate their recommendations.  Patient will be kept n.p.o. and      his diet will be increased as tolerated.  4. Patient will be continued on IV fluids.  It appears that the      patient  might need EUS, __________ CT in about 2 weeks per      Gastroenterology.  5. Leukocytosis.  Patient is on Unasyn, will continue that at this      time and continue to follow his white blood cell count.  6. Patient will be continued on GI and DVT prophylaxis at this time.      Skeet Latch, DO  Electronically Signed    SM/MEDQ  D:  07/05/2007  T:  07/05/2007  Job:  [id number]   cc:   Patrica Duel, M.D.  Fax: [phone]

## 2010-08-01 ENCOUNTER — Encounter (HOSPITAL_COMMUNITY): Payer: Medicaid Other

## 2010-08-01 ENCOUNTER — Other Ambulatory Visit (HOSPITAL_COMMUNITY): Payer: Self-pay | Admitting: Oncology

## 2010-08-01 DIAGNOSIS — I82409 Acute embolism and thrombosis of unspecified deep veins of unspecified lower extremity: Secondary | ICD-10-CM

## 2010-08-01 LAB — PROTIME-INR: Prothrombin Time: 20.4 seconds — ABNORMAL HIGH (ref 11.6–15.2)

## 2010-08-08 ENCOUNTER — Other Ambulatory Visit (HOSPITAL_COMMUNITY): Payer: Self-pay | Admitting: Oncology

## 2010-08-08 ENCOUNTER — Encounter (HOSPITAL_COMMUNITY): Payer: Medicaid Other | Attending: Oncology

## 2010-08-08 DIAGNOSIS — Z86718 Personal history of other venous thrombosis and embolism: Secondary | ICD-10-CM | POA: Insufficient documentation

## 2010-08-08 DIAGNOSIS — I82409 Acute embolism and thrombosis of unspecified deep veins of unspecified lower extremity: Secondary | ICD-10-CM

## 2010-08-08 DIAGNOSIS — Z7901 Long term (current) use of anticoagulants: Secondary | ICD-10-CM | POA: Insufficient documentation

## 2010-08-08 LAB — PROTIME-INR
INR: 1.37 (ref 0.00–1.49)
Prothrombin Time: 17.1 seconds — ABNORMAL HIGH (ref 11.6–15.2)

## 2010-08-15 ENCOUNTER — Encounter (HOSPITAL_COMMUNITY): Payer: Medicaid Other

## 2010-08-15 ENCOUNTER — Other Ambulatory Visit (HOSPITAL_COMMUNITY): Payer: Self-pay | Admitting: Oncology

## 2010-08-15 DIAGNOSIS — I82409 Acute embolism and thrombosis of unspecified deep veins of unspecified lower extremity: Secondary | ICD-10-CM

## 2010-08-18 ENCOUNTER — Encounter (HOSPITAL_COMMUNITY): Payer: Medicaid Other

## 2010-08-18 ENCOUNTER — Other Ambulatory Visit (HOSPITAL_COMMUNITY): Payer: Self-pay | Admitting: Oncology

## 2010-08-18 DIAGNOSIS — I82409 Acute embolism and thrombosis of unspecified deep veins of unspecified lower extremity: Secondary | ICD-10-CM

## 2010-08-18 LAB — BASIC METABOLIC PANEL
CO2: 26 mEq/L (ref 19–32)
Chloride: 90 mEq/L — ABNORMAL LOW (ref 96–112)
Creatinine, Ser: 0.71 mg/dL (ref 0.50–1.35)
Potassium: 3.7 mEq/L (ref 3.5–5.1)

## 2010-08-18 LAB — PROTIME-INR: INR: 3.33 — ABNORMAL HIGH (ref 0.00–1.49)

## 2010-08-21 ENCOUNTER — Other Ambulatory Visit (HOSPITAL_COMMUNITY): Payer: Medicaid Other

## 2010-09-07 ENCOUNTER — Encounter (HOSPITAL_COMMUNITY): Payer: Medicaid Other | Attending: Oncology

## 2010-09-07 ENCOUNTER — Other Ambulatory Visit (HOSPITAL_COMMUNITY): Payer: Self-pay | Admitting: Oncology

## 2010-09-07 DIAGNOSIS — Z7901 Long term (current) use of anticoagulants: Secondary | ICD-10-CM | POA: Insufficient documentation

## 2010-09-07 DIAGNOSIS — Z86718 Personal history of other venous thrombosis and embolism: Secondary | ICD-10-CM | POA: Insufficient documentation

## 2010-09-07 DIAGNOSIS — I82409 Acute embolism and thrombosis of unspecified deep veins of unspecified lower extremity: Secondary | ICD-10-CM

## 2010-09-07 LAB — BASIC METABOLIC PANEL
BUN: 9 mg/dL (ref 6–23)
CO2: 25 mEq/L (ref 19–32)
Chloride: 90 mEq/L — ABNORMAL LOW (ref 96–112)
GFR calc Af Amer: >60 mL/min (ref >60)
Glucose, Bld: 135 mg/dL — ABNORMAL HIGH (ref 70–99)
Potassium: 4 mEq/L (ref 3.5–5.1)
Sodium: 128 mEq/L — ABNORMAL LOW (ref 135–145)

## 2010-09-07 LAB — PROTIME-INR: Prothrombin Time: 27.2 seconds — ABNORMAL HIGH (ref 11.6–15.2)

## 2010-09-28 ENCOUNTER — Encounter (HOSPITAL_BASED_OUTPATIENT_CLINIC_OR_DEPARTMENT_OTHER): Payer: Medicaid Other | Admitting: Oncology

## 2010-09-28 ENCOUNTER — Encounter (HOSPITAL_COMMUNITY): Payer: Self-pay | Admitting: Oncology

## 2010-09-28 ENCOUNTER — Other Ambulatory Visit (HOSPITAL_COMMUNITY): Payer: Self-pay | Admitting: Oncology

## 2010-09-28 ENCOUNTER — Encounter (HOSPITAL_COMMUNITY): Payer: Medicaid Other

## 2010-09-28 DIAGNOSIS — I82409 Acute embolism and thrombosis of unspecified deep veins of unspecified lower extremity: Secondary | ICD-10-CM

## 2010-09-28 DIAGNOSIS — D696 Thrombocytopenia, unspecified: Secondary | ICD-10-CM

## 2010-09-28 DIAGNOSIS — R600 Localized edema: Secondary | ICD-10-CM

## 2010-09-28 DIAGNOSIS — R609 Edema, unspecified: Secondary | ICD-10-CM

## 2010-09-28 DIAGNOSIS — R918 Other nonspecific abnormal finding of lung field: Secondary | ICD-10-CM

## 2010-09-28 HISTORY — DX: Acute embolism and thrombosis of unspecified deep veins of unspecified lower extremity: I82.409

## 2010-09-28 HISTORY — DX: Other nonspecific abnormal finding of lung field: R91.8

## 2010-09-28 HISTORY — DX: Thrombocytopenia, unspecified: D69.6

## 2010-09-28 HISTORY — DX: Localized edema: R60.0

## 2010-09-28 LAB — PROTIME-INR
INR: 1.91 — ABNORMAL HIGH (ref 0.00–1.49)
Prothrombin Time: 22.2 seconds — ABNORMAL HIGH (ref 11.6–15.2)

## 2010-09-28 MED ORDER — SPIRONOLACTONE 25 MG PO TABS: 25.0000 mg | ORAL_TABLET | Freq: Every day | ORAL | Status: DC

## 2010-09-28 MED ORDER — FUROSEMIDE 20 MG PO TABS: 20.0000 mg | ORAL_TABLET | Freq: Every day | ORAL | Status: DC

## 2010-09-28 NOTE — Progress Notes (Signed)
Labs drawn today for Pt.  Patient is on 4mg  coud.  Call patient at [phone]

## 2010-09-28 NOTE — Progress Notes (Signed)
Left msg on patient's home phone stating that he is to continue same dose coumadin and gave him his next appt time. The first time I called someone hung up on me.

## 2010-09-28 NOTE — Progress Notes (Signed)
Patient called and stated that he wants to come every 4 weeks due to not being able to pay out of pocket for medical expenses. He is only approved for 22 visits per year. Tom notified and said ok.

## 2010-09-28 NOTE — Progress Notes (Signed)
Wesley Ruths, MD [address] [place] [address]  1. Pedal edema   2. DVT (deep venous thrombosis)   3. H/O Thrombocytopenia   4. Chronic edema of lower extremity   5. Multiple lung nodules     CURRENT THERAPY: On Coumadin anticoagulation  INTERVAL HISTORY: Wesley Ho 55 y.o. male seen as a walk-in today. He is here today for a PT/INR level. In the past he has been complaining about lower extremity edema. As a result, in the past we gave him Dyazide which did not seem to help. Therefore we have tried Lasix and Aldactone which has provided good results. In the past, the patient continued to ask for these 2 prescriptions. However I felt uncomfortable continuing these medications without seeing him physically. As result, I asked the patient to be seen on his next PT/INR level. In examination room, the patient showed me his pedal edema and edema of the lower extremities. Obvious pitting edema was identified. The patient reports that his edema is resolved in the morning when he awakes. As the day continues his pedal edema it gets worse. He does have compression stockings at home but he says he refuses to wear them because they cause him discomfort.  Past Medical History  Diagnosis Date  . DVT (deep venous thrombosis) 09/28/2010  . H/O Thrombocytopenia 09/28/2010  . Chronic edema of lower extremity 09/28/2010  . Multiple lung nodules 09/28/2010    has DVT (deep venous thrombosis); H/O Thrombocytopenia; Chronic edema of lower extremity; and Multiple lung nodules on his problem list.      has no known allergies.  Wesley Ho does not currently have medications on file.  No past surgical history on file.    PHYSICAL EXAMINATION  There were no vitals filed for this visit.  GENERAL:alert, healthy, no distress, well nourished, well developed, anxious, comfortable and cooperative SKIN: skin color, texture, turgor are normal, no rashes or significant  lesions HEAD: Normocephalic, No masses, lesions, tenderness or abnormalities EYES: normal EARS: External ears normal OROPHARYNX:not examined  NECK: trachea midline LYMPH:  not examined BREAST:not examined LUNGS: not examined HEART: not examined ABDOMEN:not examied BACK: not examined EXTREMITIES:less then 2 second capillary refill, no joint deformities, effusion, or inflammation, no skin discoloration, no cyanosis, positive findings:  edema of the lower extremities B/L.  2-3+ pre tibial pitting edema B/L  NEURO: alert & oriented x 3 with fluent speech, no focal motor/sensory deficits, gait normal    LABORATORY DATA: Lab Results  Component Value Date   INR 1.91* 09/28/2010   INR 2.47* 09/07/2010   INR 3.33* 08/18/2010      ASSESSMENT:  1. 2-3+ B/L LE pitting edema 2. DVT 3. Emphysema 4. Multiple lung nodules, being followed by CT scans   PLAN:  1. Lasix 20 mg #15 and Spironolactone 25 mg #15 e-prescribed to the patient without refills 2. Patient education regarding the pathophysiology of his LE edema. 3. Patient education about keeping his LE elevated when at home resting. 4. Return as scheduled.   All questions were answered. The patient knows to call the clinic with any problems, questions or concerns. We can certainly see the patient much sooner if necessary.  I spent 10 minutes counseling the patient face to face. The total time spent in the appointment was 15 minutes.  Wesley Ho

## 2010-10-12 ENCOUNTER — Other Ambulatory Visit (HOSPITAL_COMMUNITY): Payer: Medicaid Other

## 2010-10-16 ENCOUNTER — Other Ambulatory Visit (HOSPITAL_COMMUNITY): Payer: Self-pay | Admitting: Oncology

## 2010-10-16 DIAGNOSIS — I82409 Acute embolism and thrombosis of unspecified deep veins of unspecified lower extremity: Secondary | ICD-10-CM

## 2010-10-16 MED ORDER — WARFARIN SODIUM 4 MG PO TABS: 4.0000 mg | ORAL_TABLET | Freq: Every day | ORAL | Status: DC

## 2010-10-19 ENCOUNTER — Other Ambulatory Visit (HOSPITAL_COMMUNITY): Payer: Medicaid Other

## 2010-10-26 ENCOUNTER — Encounter (HOSPITAL_COMMUNITY): Payer: Medicaid Other | Attending: Oncology

## 2010-10-26 DIAGNOSIS — I82409 Acute embolism and thrombosis of unspecified deep veins of unspecified lower extremity: Secondary | ICD-10-CM | POA: Insufficient documentation

## 2010-10-26 LAB — PROTIME-INR
INR: 1.58 — ABNORMAL HIGH (ref 0.00–1.49)
Prothrombin Time: 19.2 seconds — ABNORMAL HIGH (ref 11.6–15.2)

## 2010-10-26 NOTE — Progress Notes (Signed)
Labs drawn today for pt.  Patient takes 4mg . Call patient at [phone]

## 2010-10-27 ENCOUNTER — Telehealth (HOSPITAL_COMMUNITY): Payer: Self-pay

## 2010-10-27 NOTE — Progress Notes (Signed)
Lasix 20 mg po daily # 30 and Spironolactone 50 mg 1 bid # 60 called in to [facility] per request of Dr. Mariel Sleet.  Patient notified.  Patient to call us in 2 weeks to let us know how the swelling in his legs is doing.

## 2010-10-27 NOTE — Telephone Encounter (Signed)
PT/INR IN 10 DAYS AND INCREASE COUMADIN TO 5MG  DAILY.

## 2010-10-31 ENCOUNTER — Other Ambulatory Visit (HOSPITAL_COMMUNITY): Payer: Self-pay | Admitting: Oncology

## 2010-10-31 DIAGNOSIS — R600 Localized edema: Secondary | ICD-10-CM

## 2010-10-31 MED ORDER — SPIRONOLACTONE 25 MG PO TABS: 50.0000 mg | ORAL_TABLET | Freq: Two times a day (BID) | ORAL | Status: DC

## 2010-10-31 MED ORDER — FUROSEMIDE 20 MG PO TABS: 20.0000 mg | ORAL_TABLET | Freq: Every day | ORAL | Status: DC

## 2010-11-07 ENCOUNTER — Other Ambulatory Visit (HOSPITAL_COMMUNITY): Payer: Medicaid Other

## 2010-11-08 ENCOUNTER — Encounter (HOSPITAL_COMMUNITY): Payer: Medicaid Other | Attending: Oncology

## 2010-11-08 DIAGNOSIS — I82409 Acute embolism and thrombosis of unspecified deep veins of unspecified lower extremity: Secondary | ICD-10-CM | POA: Insufficient documentation

## 2010-11-08 NOTE — Progress Notes (Signed)
Labs drawn today for pt.  Patient on 5mg  coud a day.  Call patient at [phone]

## 2010-11-09 ENCOUNTER — Other Ambulatory Visit (HOSPITAL_COMMUNITY): Payer: Self-pay | Admitting: *Deleted

## 2010-11-09 ENCOUNTER — Telehealth (HOSPITAL_COMMUNITY): Payer: Self-pay

## 2010-11-09 DIAGNOSIS — I82409 Acute embolism and thrombosis of unspecified deep veins of unspecified lower extremity: Secondary | ICD-10-CM

## 2010-11-09 NOTE — Progress Notes (Signed)
Pt. Called to hold coumadin on 9/5 and will start coumadin 4 mg on 9/6. PT level on 9/11

## 2010-11-09 NOTE — Telephone Encounter (Signed)
Spoke with Mr. Carolyne Fiscal.  To start coumadin 4mg  daily and RTC on 9/11 for PT/INR.  Waiting on appeal to insurance to allow for more frequent visits.  Encouraged to talk to Artist regarding  financial assistance programs through [employer].

## 2010-11-14 ENCOUNTER — Other Ambulatory Visit (HOSPITAL_COMMUNITY): Payer: Self-pay | Admitting: Oncology

## 2010-11-14 ENCOUNTER — Encounter (HOSPITAL_COMMUNITY): Payer: Medicaid Other

## 2010-11-14 DIAGNOSIS — I82409 Acute embolism and thrombosis of unspecified deep veins of unspecified lower extremity: Secondary | ICD-10-CM

## 2010-11-14 LAB — PROTIME-INR: Prothrombin Time: 23.7 seconds — ABNORMAL HIGH (ref 11.6–15.2)

## 2010-11-14 NOTE — Progress Notes (Signed)
Labs drawn today for pt.  Patient on 4mg  coud.  Call patient at [phone]

## 2010-11-15 ENCOUNTER — Telehealth (HOSPITAL_COMMUNITY): Payer: Self-pay

## 2010-11-15 NOTE — Telephone Encounter (Signed)
Patient notified plan of care by Edythe Lynn, RN.

## 2010-11-22 ENCOUNTER — Ambulatory Visit (HOSPITAL_COMMUNITY): Payer: Medicaid Other | Admitting: Oncology

## 2010-11-28 ENCOUNTER — Other Ambulatory Visit (HOSPITAL_COMMUNITY): Payer: Self-pay | Admitting: Oncology

## 2010-11-28 ENCOUNTER — Telehealth (HOSPITAL_COMMUNITY): Payer: Self-pay | Admitting: *Deleted

## 2010-11-28 ENCOUNTER — Encounter (HOSPITAL_BASED_OUTPATIENT_CLINIC_OR_DEPARTMENT_OTHER): Payer: Medicaid Other

## 2010-11-28 DIAGNOSIS — I82409 Acute embolism and thrombosis of unspecified deep veins of unspecified lower extremity: Secondary | ICD-10-CM

## 2010-11-28 NOTE — Progress Notes (Signed)
Labs drawn today for pt.  Patient on 4mg   Of coud. Call patient at [phone]

## 2010-11-28 NOTE — Telephone Encounter (Signed)
Message copied by Adelene Amas on Tue Nov 28, 2010  3:50 PM ------      Message from: Ellouise Newer III      Created: Tue Nov 28, 2010  1:01 PM       Same dose            PT/INR in 2 weeks

## 2010-11-28 NOTE — Telephone Encounter (Signed)
Pt notified to continue same dose coumadin. PT level in 2 weeks. He was advised that he can go to [facility] to have PT levels done and not count against his visits. He wants to know how much the visits there will cost and if he is ok with this he will go there in 2 weeks.   Will ask financial counselor to contact [facility] to investigate cost.

## 2010-12-12 ENCOUNTER — Other Ambulatory Visit (HOSPITAL_COMMUNITY): Payer: Medicaid Other

## 2010-12-12 LAB — PROTIME-INR

## 2010-12-14 ENCOUNTER — Telehealth (HOSPITAL_COMMUNITY): Payer: Self-pay | Admitting: *Deleted

## 2010-12-14 NOTE — Telephone Encounter (Signed)
Patient notified to continue same dose of Coumadin. Patient will be going to [facility] for future labs. I am faxing the order to [facility] now for PT/INR in 4 weeks. Patient instructed to go Nov 6, 7, or 8th for INR. He verbalized understanding of all instructions.

## 2011-01-09 ENCOUNTER — Encounter (HOSPITAL_COMMUNITY): Payer: Medicaid Other | Attending: Oncology | Admitting: Oncology

## 2011-01-09 ENCOUNTER — Encounter (HOSPITAL_COMMUNITY): Payer: Self-pay | Admitting: Oncology

## 2011-01-09 ENCOUNTER — Telehealth (HOSPITAL_COMMUNITY): Payer: Self-pay

## 2011-01-09 DIAGNOSIS — I82409 Acute embolism and thrombosis of unspecified deep veins of unspecified lower extremity: Secondary | ICD-10-CM | POA: Insufficient documentation

## 2011-01-09 DIAGNOSIS — Z7901 Long term (current) use of anticoagulants: Secondary | ICD-10-CM

## 2011-01-09 LAB — PROTIME-INR
INR: 3.03 — ABNORMAL HIGH (ref 0.00–1.49)
Prothrombin Time: 31.9 seconds — ABNORMAL HIGH (ref 11.6–15.2)

## 2011-01-09 NOTE — Telephone Encounter (Signed)
Notes Recorded by Randall An, MD on 01/09/2011 at 11:33 AM Change to 2mg (1/2 tab) alternating with 4 mg until pills all gone.   1627 Patient notified and verbalizes understanding.  Terisa Starr, RN

## 2011-01-09 NOTE — Progress Notes (Signed)
CC:   Wesley Ho, M.D.  DIAGNOSES: 1. Deep venous thrombosis of the left leg diagnosed in 02/2009 with a     negative hypercoagulable workup.  His symptoms however date back to     07/2008.  He has been on Coumadin and has been more therapeutic in     the last year than before but he is doing well and I plan to stop     the Coumadin as of the end of this month which is when his     prescription runs out. 2. Thrombocytopenia 02/2009 which resolved. 3. Chronic lower leg edema, now 1+ on the left, 1+ on the right, and     it is clearly better on his Dyazide Mondays, Wednesdays and     Fridays. 4. Longstanding smoking history with chronic obstructive pulmonary     disease and emphysema with pulmonary nodules seen on his chest CT     and a followup one in December is scheduled. 5. Obesity. 6. Pancreatitis in 2009 thought to be due to a stone. 7. Colonoscopy in 2011 by Dr. Lovell Sheehan. 8. Cholecystectomy in 2008. 9. Bilateral gynecomastia that is asymptomatic.  Wesley Ho is not wearing his support hose which has been recommended to him.  He elevates his legs on the couch usually above his belly button but when he is in a chair they are not elevated that much at all.  He does not have a recliner.  He is not active.  He is still very sedentary, not walking as recommended.  He is still smoking a pack a day at least.  He does not want to go smoking cessation classes but we are going to give him some dates.  He is having his blood work at [facility] across the street and an INR in October was perfect.  His Coumadin dose; he states he is taking 4 mg a day.  He is also on spironolactone 25 mg b.i.d., and he takes Lasix 20 mg once a day on those dates.  He is on Xanax, he states, as well.  We are not typically refilling that, that is from Dr. Regino Schultze.  His Dyazide is actually not listed though that is what we have refilled in the past.  He has just 1+ edema on the legs today in the  pretibial areas.  The ankles are 1+ at best as well.  He is not having any change in his breathing.  He just is very apathetic looking; he does not admit to depression very much, he has a little bit he states, does not want to see a counselor even though that is offered to him today as well.  PHYSICAL EXAMINATION:  Vital signs:  His vital signs are basically unchanged compared to May. Blood pressure is 110/76, weight is 211 pounds.  He is 5 feet 9 inches tall.  They did not get a height on him today so BMI is not calculated but he is definitely overweight for his height.  His pulse is right around 100-112 according to the nurse, respirations 18 and unlabored.  General:  He is certainly in no acute distress and does not look acutely ill.  What we are going to do is see him in December after the CT scan but I do not think I can truly justify lifelong Coumadin just with 1 episode of a DVT.  I think his leg swelling is a result of possible valve damage from the DVT but I think we need  to treat that independent.  So we are going to stop the Coumadin and I talked to him about the fact that we are not going to go with lifelong Coumadin, unless he has a recurrence or relapse and he understands that.  I wish he would do the things that are best for him such as quitting smoking, walking more, exercising more, elevating his legs and wearing the stockings but he just does not want to participate in his care at times.  So we will see him back in December and we will go from there. We will have to see him problem on a regular basis, perhaps every 6 months after that.    ______________________________ Ladona Horns. Mariel Sleet, MD ESN/MEDQ  D:  01/09/2011  T:  01/09/2011  Job:  [id number]

## 2011-01-09 NOTE — Progress Notes (Signed)
This office note has been dictated.

## 2011-01-09 NOTE — Patient Instructions (Signed)
[facility]  Discharge Instructions  RECOMMENDATIONS MADE BY THE CONSULTANT AND ANY TEST RESULTS WILL BE SENT TO YOUR REFERRING DOCTOR.   EXAM FINDINGS BY MD TODAY AND SIGNS AND SYMPTOMS TO REPORT TO CLINIC OR PRIMARY MD: Will stop coumadin after you finish the bottle that you have. Will do PT level today.  Need to attend smoking cessation classes and you need to stop smoking.  MEDICATIONS PRESCRIBED: none    INSTRUCTIONS GIVEN AND DISCUSSED: Report any new swelling of extremities.  SPECIAL INSTRUCTIONS/FOLLOW-UP: Xray Studies Needed CT of chest in December and Return to Clinic after CT Scans to see Tom.   I acknowledge that I have been informed and understand all the instructions given to me and received a copy. I do not have any more questions at this time, but understand that I may call the Specialty Clinic at [facility] at [phone] during business hours should I have any further questions or need assistance in obtaining follow-up care.    __________________________________________  _____________  __________ Signature of Patient or Authorized Representative            Date                   Time    __________________________________________ Nurse's Signature

## 2011-01-15 ENCOUNTER — Encounter: Payer: Self-pay | Admitting: Oncology

## 2011-01-23 ENCOUNTER — Telehealth (HOSPITAL_COMMUNITY): Payer: Self-pay | Admitting: *Deleted

## 2011-01-23 NOTE — Telephone Encounter (Signed)
Message copied by Dennie Maizes on Tue Jan 23, 2011  4:49 PM ------      Message from: Mariel Sleet, ERIC S      Created: Tue Jan 23, 2011  1:20 PM       Aldactone and lasix if I remember the note well??!!!      ----- Message -----         From: Valentina Shaggy Zacherie Honeyman, RN         Sent: 01/23/2011   1:05 PM           To: Randall An, MD            Need more information, what medication to refill?      ----- Message -----         From: Randall An, MD         Sent: 01/23/2011  12:23 PM           To: Nanci Pina Nurse Ap            Okay to refill X 3 but check my note to see if I changed the recommended directions.

## 2011-01-23 NOTE — Telephone Encounter (Signed)
Refills x 3 for Aldactone and Lasix called to [facility]. Pt is aware.

## 2011-02-06 ENCOUNTER — Other Ambulatory Visit (HOSPITAL_COMMUNITY): Payer: Self-pay | Admitting: Oncology

## 2011-02-06 DIAGNOSIS — R918 Other nonspecific abnormal finding of lung field: Secondary | ICD-10-CM

## 2011-02-07 ENCOUNTER — Other Ambulatory Visit (HOSPITAL_COMMUNITY): Payer: Self-pay | Admitting: Oncology

## 2011-02-07 ENCOUNTER — Ambulatory Visit (HOSPITAL_COMMUNITY)
Admission: RE | Admit: 2011-02-07 | Discharge: 2011-02-07 | Disposition: A | Discharge status: Home / Self Care | Payer: Medicaid Other | Source: Ambulatory Visit | Attending: Oncology | Admitting: Oncology

## 2011-02-07 ENCOUNTER — Ambulatory Visit (HOSPITAL_COMMUNITY): Payer: Medicaid Other

## 2011-02-07 DIAGNOSIS — R918 Other nonspecific abnormal finding of lung field: Secondary | ICD-10-CM

## 2011-02-07 DIAGNOSIS — J984 Other disorders of lung: Secondary | ICD-10-CM | POA: Insufficient documentation

## 2011-03-09 ENCOUNTER — Ambulatory Visit (HOSPITAL_COMMUNITY): Payer: Medicaid Other | Admitting: Oncology

## 2011-08-23 ENCOUNTER — Telehealth (HOSPITAL_COMMUNITY): Payer: Self-pay | Admitting: Oncology

## 2012-02-06 ENCOUNTER — Telehealth (HOSPITAL_COMMUNITY): Payer: Self-pay | Admitting: Oncology

## 2012-02-06 NOTE — Telephone Encounter (Signed)
I spoke with Mr Wesley Ho at [place].  I informed him that Mr. Wesley Ho was being followed for pulmonary nodules.  I will print out all of his CT of chest reports and fax to Mr. Wesley Ho, [place].    Wesley Ho is due for a CT of chest tomorrow, but he contacted the clinic and reported he cannot go to that appointment.   Since we has released Mr. Wesley Ho from the clinic and he only needs to return on as needed basis, I will defer rescheduling of this and follow-up to his PCP.   Wesley Ho

## 2012-02-07 ENCOUNTER — Ambulatory Visit (HOSPITAL_COMMUNITY): Payer: Medicaid Other

## 2012-03-16 ENCOUNTER — Inpatient Hospital Stay (HOSPITAL_COMMUNITY)
Admission: EM | Admit: 2012-03-16 | Discharge: 2012-03-18 | DRG: 191 | Disposition: A | Discharge status: Home / Self Care | Payer: Medicaid Other | Attending: Internal Medicine | Admitting: Internal Medicine

## 2012-03-16 ENCOUNTER — Emergency Department (HOSPITAL_COMMUNITY): Payer: Medicaid Other

## 2012-03-16 ENCOUNTER — Encounter (HOSPITAL_COMMUNITY): Payer: Self-pay | Admitting: *Deleted

## 2012-03-16 DIAGNOSIS — F1010 Alcohol abuse, uncomplicated: Secondary | ICD-10-CM

## 2012-03-16 DIAGNOSIS — J019 Acute sinusitis, unspecified: Secondary | ICD-10-CM | POA: Diagnosis present

## 2012-03-16 DIAGNOSIS — F17200 Nicotine dependence, unspecified, uncomplicated: Secondary | ICD-10-CM | POA: Diagnosis present

## 2012-03-16 DIAGNOSIS — Z789 Other specified health status: Secondary | ICD-10-CM | POA: Diagnosis present

## 2012-03-16 DIAGNOSIS — R51 Headache: Secondary | ICD-10-CM | POA: Diagnosis present

## 2012-03-16 DIAGNOSIS — J441 Chronic obstructive pulmonary disease with (acute) exacerbation: Secondary | ICD-10-CM | POA: Diagnosis present

## 2012-03-16 DIAGNOSIS — J440 Chronic obstructive pulmonary disease with acute lower respiratory infection: Principal | ICD-10-CM | POA: Diagnosis present

## 2012-03-16 DIAGNOSIS — M79605 Pain in left leg: Secondary | ICD-10-CM | POA: Diagnosis present

## 2012-03-16 DIAGNOSIS — M79609 Pain in unspecified limb: Secondary | ICD-10-CM | POA: Diagnosis present

## 2012-03-16 DIAGNOSIS — Z86718 Personal history of other venous thrombosis and embolism: Secondary | ICD-10-CM

## 2012-03-16 DIAGNOSIS — E871 Hypo-osmolality and hyponatremia: Secondary | ICD-10-CM | POA: Diagnosis present

## 2012-03-16 DIAGNOSIS — J209 Acute bronchitis, unspecified: Principal | ICD-10-CM | POA: Diagnosis present

## 2012-03-16 DIAGNOSIS — Z720 Tobacco use: Secondary | ICD-10-CM | POA: Diagnosis present

## 2012-03-16 DIAGNOSIS — R609 Edema, unspecified: Secondary | ICD-10-CM | POA: Diagnosis present

## 2012-03-16 DIAGNOSIS — D7589 Other specified diseases of blood and blood-forming organs: Secondary | ICD-10-CM | POA: Diagnosis present

## 2012-03-16 DIAGNOSIS — M79604 Pain in right leg: Secondary | ICD-10-CM | POA: Diagnosis present

## 2012-03-16 DIAGNOSIS — R600 Localized edema: Secondary | ICD-10-CM | POA: Diagnosis present

## 2012-03-16 DIAGNOSIS — R918 Other nonspecific abnormal finding of lung field: Secondary | ICD-10-CM | POA: Diagnosis present

## 2012-03-16 HISTORY — DX: Bronchitis, not specified as acute or chronic: J40

## 2012-03-16 LAB — COMPREHENSIVE METABOLIC PANEL
Albumin: 3.4 g/dL — ABNORMAL LOW (ref 3.5–5.2)
BUN: 7 mg/dL (ref 6–23)
Chloride: 91 mEq/L — ABNORMAL LOW (ref 96–112)
Creatinine, Ser: 0.67 mg/dL (ref 0.50–1.35)
GFR calc Af Amer: >90 mL/min (ref >90)
GFR calc non Af Amer: >90 mL/min (ref >90)
Glucose, Bld: 151 mg/dL — ABNORMAL HIGH (ref 70–99)
Total Bilirubin: 0.3 mg/dL (ref 0.3–1.2)

## 2012-03-16 LAB — CBC WITH DIFFERENTIAL/PLATELET
Basophils Relative: 0 % (ref 0–1)
HCT: 44.3 % (ref 39.0–52.0)
Hemoglobin: 15 g/dL (ref 13.0–17.0)
MCH: 36.4 pg — ABNORMAL HIGH (ref 26.0–34.0)
MCHC: 33.9 g/dL (ref 30.0–36.0)
Monocytes Absolute: 0.6 10*3/uL (ref 0.1–1.0)
Monocytes Relative: 7 % (ref 3–12)
Neutro Abs: 6.3 10*3/uL (ref 1.7–7.7)

## 2012-03-16 LAB — TROPONIN I: Troponin I: <0.3 ng/mL (ref <0.30)

## 2012-03-16 MED ORDER — VITAMIN B-1 100 MG PO TABS
100.0000 mg | ORAL_TABLET | Freq: Every day | ORAL | Status: DC
  Administered 2012-03-16 – 2012-03-18 (×3): 100 mg via ORAL
  Filled 2012-03-16 (×3): qty 1

## 2012-03-16 MED ORDER — DM-GUAIFENESIN ER 30-600 MG PO TB12
1.0000 | ORAL_TABLET | Freq: Two times a day (BID) | ORAL | Status: DC
  Administered 2012-03-16 – 2012-03-17 (×2): 1 via ORAL
  Filled 2012-03-16 (×2): qty 1

## 2012-03-16 MED ORDER — ALBUTEROL SULFATE (5 MG/ML) 0.5% IN NEBU
2.5000 mg | INHALATION_SOLUTION | Freq: Once | RESPIRATORY_TRACT | Status: AC
  Administered 2012-03-16: 2.5 mg via RESPIRATORY_TRACT
  Filled 2012-03-16: qty 0.5

## 2012-03-16 MED ORDER — ALUM & MAG HYDROXIDE-SIMETH 200-200-20 MG/5ML PO SUSP: 30.0000 mL | Freq: Four times a day (QID) | ORAL | Status: DC | PRN

## 2012-03-16 MED ORDER — SODIUM CHLORIDE 0.9 % IV BOLUS (SEPSIS)
500.0000 mL | Freq: Once | INTRAVENOUS | Status: AC
  Administered 2012-03-16: 500 mL via INTRAVENOUS

## 2012-03-16 MED ORDER — ALBUTEROL SULFATE (5 MG/ML) 0.5% IN NEBU
2.5000 mg | INHALATION_SOLUTION | RESPIRATORY_TRACT | Status: DC
  Administered 2012-03-16 – 2012-03-17 (×3): 2.5 mg via RESPIRATORY_TRACT
  Filled 2012-03-16 (×3): qty 0.5

## 2012-03-16 MED ORDER — ALBUTEROL SULFATE (5 MG/ML) 0.5% IN NEBU
5.0000 mg | INHALATION_SOLUTION | Freq: Once | RESPIRATORY_TRACT | Status: AC
  Administered 2012-03-16: 5 mg via RESPIRATORY_TRACT
  Filled 2012-03-16: qty 1

## 2012-03-16 MED ORDER — POTASSIUM CHLORIDE CRYS ER 20 MEQ PO TBCR
40.0000 meq | EXTENDED_RELEASE_TABLET | Freq: Once | ORAL | Status: AC
  Administered 2012-03-16: 40 meq via ORAL
  Filled 2012-03-16: qty 2

## 2012-03-16 MED ORDER — PREDNISONE 20 MG PO TABS
40.0000 mg | ORAL_TABLET | Freq: Two times a day (BID) | ORAL | Status: DC
  Administered 2012-03-16 – 2012-03-17 (×2): 40 mg via ORAL
  Filled 2012-03-16 (×2): qty 2

## 2012-03-16 MED ORDER — IPRATROPIUM BROMIDE 0.02 % IN SOLN
0.5000 mg | Freq: Once | RESPIRATORY_TRACT | Status: AC
Start: 2012-03-16 — End: 2012-03-16
  Administered 2012-03-16: 0.5 mg via RESPIRATORY_TRACT
  Filled 2012-03-16: qty 2.5

## 2012-03-16 MED ORDER — HYDROCODONE-ACETAMINOPHEN 5-325 MG PO TABS
1.0000 | ORAL_TABLET | ORAL | Status: DC | PRN
  Administered 2012-03-16 – 2012-03-18 (×6): 2 via ORAL
  Filled 2012-03-16 (×6): qty 2

## 2012-03-16 MED ORDER — NICOTINE 21 MG/24HR TD PT24
21.0000 mg | MEDICATED_PATCH | Freq: Every day | TRANSDERMAL | Status: DC
  Administered 2012-03-16 – 2012-03-18 (×3): 21 mg via TRANSDERMAL
  Filled 2012-03-16 (×3): qty 1

## 2012-03-16 MED ORDER — DEXTROSE 5 % IV SOLN
500.0000 mg | Freq: Once | INTRAVENOUS | Status: AC
  Administered 2012-03-16: 500 mg via INTRAVENOUS
  Filled 2012-03-16 (×2): qty 500

## 2012-03-16 MED ORDER — ALPRAZOLAM 1 MG PO TABS
2.0000 mg | ORAL_TABLET | Freq: Four times a day (QID) | ORAL | Status: DC | PRN
  Administered 2012-03-16 – 2012-03-17 (×2): 2 mg via ORAL
  Filled 2012-03-16 (×2): qty 2

## 2012-03-16 MED ORDER — ACETAMINOPHEN 325 MG PO TABS: 650.0000 mg | ORAL_TABLET | Freq: Four times a day (QID) | ORAL | Status: DC | PRN

## 2012-03-16 MED ORDER — ACETAMINOPHEN 650 MG RE SUPP: 650.0000 mg | Freq: Four times a day (QID) | RECTAL | Status: DC | PRN

## 2012-03-16 MED ORDER — IPRATROPIUM BROMIDE 0.02 % IN SOLN
0.5000 mg | Freq: Four times a day (QID) | RESPIRATORY_TRACT | Status: DC
  Administered 2012-03-16 – 2012-03-18 (×6): 0.5 mg via RESPIRATORY_TRACT
  Filled 2012-03-16 (×7): qty 2.5

## 2012-03-16 MED ORDER — METHYLPREDNISOLONE SODIUM SUCC 125 MG IJ SOLR
125.0000 mg | Freq: Once | INTRAMUSCULAR | Status: AC
  Administered 2012-03-16: 125 mg via INTRAVENOUS
  Filled 2012-03-16: qty 2

## 2012-03-16 MED ORDER — IPRATROPIUM BROMIDE 0.02 % IN SOLN
0.5000 mg | Freq: Once | RESPIRATORY_TRACT | Status: AC
  Administered 2012-03-16: 0.5 mg via RESPIRATORY_TRACT
  Filled 2012-03-16: qty 2.5

## 2012-03-16 MED ORDER — ONDANSETRON HCL 4 MG PO TABS: 4.0000 mg | ORAL_TABLET | Freq: Four times a day (QID) | ORAL | Status: DC | PRN

## 2012-03-16 MED ORDER — OXYMETAZOLINE HCL 0.05 % NA SOLN
2.0000 | Freq: Two times a day (BID) | NASAL | Status: DC
  Administered 2012-03-16 – 2012-03-18 (×3): 2 via NASAL
  Filled 2012-03-16: qty 15

## 2012-03-16 MED ORDER — ALBUTEROL SULFATE (5 MG/ML) 0.5% IN NEBU
5.0000 mg | INHALATION_SOLUTION | Freq: Once | RESPIRATORY_TRACT | Status: AC
Start: 2012-03-16 — End: 2012-03-16
  Administered 2012-03-16: 5 mg via RESPIRATORY_TRACT
  Filled 2012-03-16: qty 1

## 2012-03-16 MED ORDER — ENOXAPARIN SODIUM 40 MG/0.4ML ~~LOC~~ SOLN
40.0000 mg | SUBCUTANEOUS | Status: DC
  Administered 2012-03-16 – 2012-03-17 (×2): 40 mg via SUBCUTANEOUS
  Filled 2012-03-16 (×2): qty 0.4

## 2012-03-16 MED ORDER — LEVOFLOXACIN 500 MG PO TABS
500.0000 mg | ORAL_TABLET | Freq: Every day | ORAL | Status: DC
  Administered 2012-03-16 – 2012-03-18 (×3): 500 mg via ORAL
  Filled 2012-03-16 (×3): qty 1

## 2012-03-16 MED ORDER — ONDANSETRON HCL 4 MG/2ML IJ SOLN: 4.0000 mg | Freq: Four times a day (QID) | INTRAMUSCULAR | Status: DC | PRN

## 2012-03-16 MED ORDER — DEXTROSE 5 % IV SOLN
1.0000 g | Freq: Once | INTRAVENOUS | Status: AC
  Administered 2012-03-16: 1 g via INTRAVENOUS
  Filled 2012-03-16 (×2): qty 10

## 2012-03-16 NOTE — ED Notes (Signed)
Room air sats when patient arrived to room were 80%. Placed on 2 L [place] and sats increased to 88%. RT called for stat breathing treatment. Expiratory wheezing noted in all lung fields.

## 2012-03-16 NOTE — ED Notes (Signed)
Pt c/o sob that has increased over the past three days, cough that is productive with clear sputum production, denies any fever or chills.

## 2012-03-16 NOTE — ED Provider Notes (Addendum)
History     CSN: [id number]  Arrival date & time 03/16/12  1225   First MD Initiated Contact with Patient 03/16/12 1255      Chief Complaint  Patient presents with  . Shortness of Breath    (Consider location/radiation/quality/duration/timing/severity/associated sxs/prior treatment) HPI 57 y.o. Male with history of copd with increased cough and increased sputum for 2-3 days with fatigue, dyspnea, weakness, diarrhea, nausea, and some vomiting.  Patient with some headache, denies chest pain, or abdominal pain.  Patient with chronic leg pain.  Patient is smoker and continues to smoke.  NPO today.  PMD-McGough.   Past Medical History  Diagnosis Date  . DVT (deep venous thrombosis) 09/28/2010  . H/O Thrombocytopenia 09/28/2010  . Chronic edema of lower extremity 09/28/2010  . Multiple lung nodules 09/28/2010  . Fatigue   . Emphysema of lung   . Bronchitis     Past Surgical History  Procedure Date  . Cholecystectomy   . Mouth surgery     Family History  Problem Relation Age of Onset  . Diabetes Mother   . Clotting disorder Father     History  Substance Use Topics  . Smoking status: Current Every Day Smoker -- 1.0 packs/day for 40 years  . Smokeless tobacco: Not on file  . Alcohol Use: 14.4 oz/week    24 Cans of beer per week      Review of Systems  Constitutional: Positive for appetite change and fatigue. Negative for fever and chills.  HENT: Positive for congestion, rhinorrhea and sneezing.   Eyes: Negative.   Respiratory: Positive for cough, shortness of breath and wheezing.   Cardiovascular: Negative for chest pain, palpitations and leg swelling.  Gastrointestinal: Positive for vomiting and diarrhea.  Musculoskeletal: Negative.   Skin: Negative.   Neurological: Negative.   Hematological: Negative.   Psychiatric/Behavioral: Negative.     Allergies  Review of patient's allergies indicates no known allergies.  Home Medications   Current Outpatient Rx  Name   Route  Sig  Dispense  Refill  . ALPRAZOLAM 2 MG PO TABS   Oral   Take 2 mg by mouth 4 (four) times daily. Takes every 6 hours as needed for anxiety         . FUROSEMIDE 40 MG PO TABS   Oral   Take 40 mg by mouth 2 (two) times daily.         Marland Kitchen SPIRONOLACTONE 50 MG PO TABS   Oral   Take 50 mg by mouth daily.           BP 122/83  Pulse 117  Temp 98 F (36.7 C)  Resp 36  SpO2 94%  Physical Exam  Nursing note and vitals reviewed. Constitutional: He is oriented to person, place, and time. He appears well-developed and well-nourished.       Uncomfortable appearing  HENT:  Head: Normocephalic and atraumatic.  Right Ear: External ear normal.  Left Ear: External ear normal.       Mucous membranes dry  Eyes: Conjunctivae normal and EOM are normal. Pupils are equal, round, and reactive to light.  Neck: Normal range of motion.  Cardiovascular: Regular rhythm.  Tachycardia present.   Pulmonary/Chest:       Coughing on exam, decreased air movement throughout, tachypneic on oxygen.    Abdominal: Soft. Bowel sounds are normal.  Musculoskeletal: Normal range of motion.  Neurological: He is alert and oriented to person, place, and time.  Skin: Skin is warm and dry.  No pallor.  Psychiatric: He has a normal mood and affect. His behavior is normal. Judgment and thought content normal.    ED Course  Procedures (including critical care time)  Labs Reviewed - No data to display No results found.   No diagnosis found.    Date: 03/16/2012  Rate: 104  Rhythm: sinus tachycardia  QRS Axis: normal  Intervals: normal  ST/T Wave abnormalities: normal  Conduction Disutrbances: none  Narrative Interpretation: unremarkable Results for orders placed during the hospital encounter of 03/16/12  CBC WITH DIFFERENTIAL      Component Value Range   WBC 8.3  4.0 - 10.5 K/uL   RBC 4.12 (*) 4.22 - 5.81 MIL/uL   Hemoglobin 15.0  13.0 - 17.0 g/dL   HCT 16.1  09.6 - 04.5 %   MCV 107.5 (*)  78.0 - 100.0 fL   MCH 36.4 (*) 26.0 - 34.0 pg   MCHC 33.9  30.0 - 36.0 g/dL   RDW 40.9  81.1 - 91.4 %   Platelets 235  150 - 400 K/uL   Neutrophils Relative 76  43 - 77 %   Neutro Abs 6.3  1.7 - 7.7 K/uL   Lymphocytes Relative 17  12 - 46 %   Lymphs Abs 1.4  0.7 - 4.0 K/uL   Monocytes Relative 7  3 - 12 %   Monocytes Absolute 0.6  0.1 - 1.0 K/uL   Eosinophils Relative 0  0 - 5 %   Eosinophils Absolute 0.0  0.0 - 0.7 K/uL   Basophils Relative 0  0 - 1 %   Basophils Absolute 0.0  0.0 - 0.1 K/uL  COMPREHENSIVE METABOLIC PANEL      Component Value Range   Sodium 132 (*) 135 - 145 mEq/L   Potassium 3.4 (*) 3.5 - 5.1 mEq/L   Chloride 91 (*) 96 - 112 mEq/L   CO2 23  19 - 32 mEq/L   Glucose, Bld 151 (*) 70 - 99 mg/dL   BUN 7  6 - 23 mg/dL   Creatinine, Ser 7.82  0.50 - 1.35 mg/dL   Calcium 9.0  8.4 - 95.6 mg/dL   Total Protein 8.3  6.0 - 8.3 g/dL   Albumin 3.4 (*) 3.5 - 5.2 g/dL   AST 44 (*) 0 - 37 U/L   ALT 34  0 - 53 U/L   Alkaline Phosphatase 48  39 - 117 U/L   Total Bilirubin 0.3  0.3 - 1.2 mg/dL   GFR calc non Af Amer >90  >90 mL/min   GFR calc Af Amer >90  >90 mL/min  TROPONIN I      Component Value Range   Troponin I <0.30  <0.30 ng/mL    CRITICAL CARE Performed by: Hilario Quarry   Total critical care time: 35  Critical care time was exclusive of separately billable procedures and treating other patients.  Critical care was necessary to treat or prevent imminent or life-threatening deterioration.  Critical care was time spent personally by me on the following activities: development of treatment plan with patient and/or surrogate as well as nursing, discussions with consultants, evaluation of patient's response to treatment, examination of patient, obtaining history from patient or surrogate, ordering and performing treatments and interventions, ordering and review of laboratory studies, ordering and review of radiographic studies, pulse oximetry and re-evaluation of  patient's condition.    MDM  Patient with continued expiratory wheezing but feels somewhat improved.  Zithromax infusing.  Sats 97% on 2  liters n. C.  Repeat neb ordered.  HR decreased to 90s and continues sinus on monitor.      Discussed with Dr. Lendell Caprice.  Patient appears more stable from a respiratory stand point than on presentation and sats have been stable, appears improving, we agree that patient appears acceptable for med surg bed.    Hilario Quarry, MD 03/16/12 1525  Hilario Quarry, MD 03/16/12 [phone]

## 2012-03-16 NOTE — H&P (Signed)
Hospital Admission Note Date: 03/16/2012  Patient name: GERALD KUEHL Medical record number: [id number] Date of birth: 10-31-1955 Age: 57 y.o. Gender: male PCP: Kirk Ruths, MD  Attending physician: Christiane Ha, MD  Chief Complaint: Cough and shortness of breath  History of Present Illness:  FAITH BRANAN is an 57 y.o. male who presents with a five-day history of worsening cough and shortness of breath. The cough is nonproductive. She also has a headache and sinus congestion. Postnasal drip. Low-grade fever here in the emergency room. He has been unable to ambulate much due to to the shortness of breath. He smokes about 2 packs of cigarettes a day. He also drinks heavily. He has a history of DVT and is worried that he may have recurrence do to return of leg pains. Chest x-ray in the emergency room shows no pneumonia. Initial oxygen saturation on room air was 90. His respiratory rate initially was 36. After bronchodilators oxygen and steroids, his respiratory rate has come down and his oxygenation has improved. Initially, his heart rate also was 117. He had a flu vaccine this year.  Past Medical History  Diagnosis Date  . DVT (deep venous thrombosis) 09/28/2010  . H/O Thrombocytopenia 09/28/2010  . Chronic edema of lower extremity 09/28/2010  . Multiple lung nodules 09/28/2010  . Fatigue   . Emphysema of lung   . Bronchitis     Meds: See home medication list  Allergies: Review of patient's allergies indicates no known allergies. History   Social History  . Marital Status: Legally Separated    Spouse Name: N/A    Number of Children: N/A  . Years of Education: N/A   Occupational History  . Not on file.   Social History Main Topics  . Smoking status: Current Every Day Smoker -- 1.0 packs/day for 40 years  . Smokeless tobacco: Not on file  . Alcohol Use: 14.4 oz/week    24 Cans of beer per week  . Drug Use: No  . Sexually Active:    Other Topics Concern  . Not  on file   Social History Narrative  . No narrative on file   Family History  Problem Relation Age of Onset  . Diabetes Mother   . Clotting disorder Father    Past Surgical History  Procedure Date  . Cholecystectomy   . Mouth surgery     Review of Systems: Systems reviewed and as per HPI, otherwise negative.  Physical Exam: Blood pressure 127/87, pulse 94, temperature 100.1 F (37.8 C), temperature source Rectal, resp. rate 22, SpO2 100.00%. BP 132/86  Pulse 97  Temp 97.9 F (36.6 C) (Rectal)  Resp 20  SpO2 93%  General Appearance:    Alert, cooperative, no distress, appears stated age. unkempt   Head:    Normocephalic, without obvious abnormality, atraumatic  Eyes:    PERRL, conjunctiva/corneas clear, EOM's intact, fundi    benign, both eyes       Ears:    Normal TM's and external ear canals, both ears  Nose:   Nares normal, septum midline, mucosa normal, no drainage    mild frontal sinus tenderness   Throat:   moist mucous membranes. Erythema and mucous noted in the posterior oropharynx   Neck:   Supple, symmetrical, trachea midline, no adenopathy;       thyroid:  No enlargement/tenderness/nodules; no carotid   bruit or JVD  Back:     Symmetric, no curvature, ROM normal, no CVA tenderness  Lungs:  diminished throughout. Breathing nonlabored. No rhonchi or rales   Chest wall:    No tenderness or deformity  Heart:    Regular rate and rhythm, S1 and S2 normal, no murmur, rub   or gallop  Abdomen:     Soft, non-tender, bowel sounds active all four quadrants,    no masses, no organomegaly  Genitalia:   deferred   Rectal:   deferred  Extremities:   Extremities normal, atraumatic, no cyanosis or edema  Pulses:   2+ and symmetric all extremities  Skin:   Skin color, texture, turgor normal, no rashes or lesions  Lymph nodes:   Cervical, supraclavicular, and axillary nodes normal  Neurologic:   CNII-XII intact. Normal strength, sensation and reflexes      Throughout.   fine tremor present. No asterixis     Psychiatric: Normal affect. Calm and cooperative.  Lab results: Basic Metabolic Panel:  Basename 03/16/12 1327  NA 132*  K 3.4*  CL 91*  CO2 23  GLUCOSE 151*  BUN 7  CREATININE 0.67  CALCIUM 9.0  MG --  PHOS --   Liver Function Tests:  Basename 03/16/12 1327  AST 44*  ALT 34  ALKPHOS 48  BILITOT 0.3  PROT 8.3  ALBUMIN 3.4*   No results found for this basename: LIPASE:2,AMYLASE:2 in the last 72 hours No results found for this basename: AMMONIA:2 in the last 72 hours CBC:  Basename 03/16/12 1327  WBC 8.3  NEUTROABS 6.3  HGB 15.0  HCT 44.3  MCV 107.5*  PLT 235   Cardiac Enzymes:  Basename 03/16/12 1330  CKTOTAL --  CKMB --  CKMBINDEX --  TROPONINI <0.30   Imaging results:  Dg Chest 2 View  03/16/2012  *RADIOLOGY REPORT*  Clinical Data: Cough.  Shortness of breath.  Emphysema.  CHEST - 2 VIEW  Comparison: Chest x-ray dated 06/18/2009 and 07/24/2006  Findings: Heart size and pulmonary vascularity are normal.  No infiltrates or effusions.  Increased bronchitic changes bilaterally which could be acute or chronic.  No acute osseous abnormality.  IMPRESSION: Increased bronchitic changes.   Original Report Authenticated By: Francene Boyers, M.D.     Assessment & Plan: Active Problems:  Acute bronchitis  COPD exacerbation  Acute sinusitis  Alcohol consumption heavy  Leg pain, bilateral  Chronic edema of lower extremity  Multiple lung nodules  Tobacco abuse  Macrocytosis without anemia  Personal history of DVT (deep vein thrombosis)  Hyponatremia  Patient will get antibiotics, steroids, bronchodilators, nicotine replacement. Monitor for alcohol withdrawal. Give thiamine. Tobacco cessation counseling. Social work consult to evaluate alcohol use and provide information for treatment if appropriate. Check ultrasound oflegs for DVT. Afrin nasal spray. Check B12 and folate. Replete potassium. Admit to MedSurg.  Ayomikun Starling  L 03/16/2012, 4:11 PM

## 2012-03-16 NOTE — ED Notes (Signed)
Report given to Erica, RN unit 300. Ready to receive patient. 

## 2012-03-17 ENCOUNTER — Inpatient Hospital Stay (HOSPITAL_COMMUNITY): Payer: Medicaid Other

## 2012-03-17 LAB — FOLATE RBC: RBC Folate: 697 ng/mL — ABNORMAL HIGH (ref >=366)

## 2012-03-17 MED ORDER — PREDNISONE 20 MG PO TABS
40.0000 mg | ORAL_TABLET | Freq: Every day | ORAL | Status: DC
  Administered 2012-03-18: 40 mg via ORAL
  Filled 2012-03-17: qty 2

## 2012-03-17 MED ORDER — ALPRAZOLAM 1 MG PO TABS
2.0000 mg | ORAL_TABLET | Freq: Three times a day (TID) | ORAL | Status: DC
Start: 2012-03-17 — End: 2012-03-18
  Administered 2012-03-17 – 2012-03-18 (×5): 2 mg via ORAL
  Filled 2012-03-17 (×5): qty 2

## 2012-03-17 MED ORDER — LEVALBUTEROL HCL 0.63 MG/3ML IN NEBU
0.6300 mg | INHALATION_SOLUTION | Freq: Four times a day (QID) | RESPIRATORY_TRACT | Status: DC
  Administered 2012-03-17 – 2012-03-18 (×3): 0.63 mg via RESPIRATORY_TRACT
  Filled 2012-03-17 (×4): qty 3

## 2012-03-17 NOTE — Clinical Social Work Psychosocial (Signed)
    Clinical Social Work Department BRIEF PSYCHOSOCIAL ASSESSMENT 03/17/2012  Patient:  Wesley Ho, Wesley Ho     Account Number:  [id number]     Admit date:  03/16/2012  Clinical Social Worker:  Santa Genera, CLINICAL SOCIAL WORKER  Date/Time:  03/17/2012 12:00 N  Referred by:  Physician  Date Referred:  03/17/2012 Referred for  Substance Abuse   Other Referral:   Interview type:  Patient Other interview type:    PSYCHOSOCIAL DATA Living Status:  FAMILY Admitted from facility:   Level of care:   Primary support name:  [name] Primary support relationship to patient:  CHILD, ADULT Degree of support available:   Adequate    CURRENT CONCERNS Current Concerns  Substance Abuse   Other Concerns:    SOCIAL WORK ASSESSMENT / PLAN CSW reviewed chart, noted patient reported consumption of 24 cans beer/week.  Met w patient at bedside, patient alert, oriented and willing to discuss alcohol use. Completed SBIRT w patient.  Scored total of 9, w consumption score of 5, dependence score of 0 and alcohol related problem score of 4.    Patient states he drinks 2 -3 beers/day at his home.  Began drinking as a teenager, quit for 20+ years after birth of first child.  Resumed drinking after divorce in approx 2009.  Is currently single parent of14 year old high school senior son.  Has daughter who lives out of state.  Son lives in the home.  Patient unemployed and seeking disabliity due to physical limitations from COPD.  Has not worked in several years, severely fatigued.    Patient does not feel his alcohol use is problematic, has been told by PA that he should cut down but patient feels that his smoking is much more of a health problem and wants to address that first.  Worked w patient to discuss various alternatives for smoking cessation, encouraged patient to have realistic game plan for remaining abstinent from tobacco upon discharge.  Has not been smoking while hospitalized.  Patient wants  to try electronic cigarettes.    Patient given several alternatives for cutting down on alcohol use, including drink monitoring and drink spacing. Encouraged patient to cut down and follow medical advice regarding alcohol.  However, patient is clear that he wishes to address cigarette smoking first and sees quitting smoking as more clearly related to his health problems.   Assessment/plan status:  No Further Intervention Required Other assessment/ plan:   Information/referral to community resources:   Patient declined information on alcohol cessation and would prefer information on smoking cessation.    PATIENT'S/FAMILY'S RESPONSE TO PLAN OF CARE: Patient appreciative.   Santa Genera, LCSW Clinical Social Worker [phone])

## 2012-03-17 NOTE — Care Management Note (Signed)
    Page 1 of 1   03/18/2012     2:41:51 PM   CARE MANAGEMENT NOTE 03/18/2012  Patient:  Wesley Ho, Wesley Ho   Account Number:  [id number]  Date Initiated:  03/17/2012  Documentation initiated by:  Rosemary Holms  Subjective/Objective Assessment:   Pt states he lives at home with 59 yo son. Does not anticipate any HH/DME needs     Action/Plan:   Anticipated DC Date:  03/18/2012   Anticipated DC Plan:  HOME/SELF CARE      DC Planning Services  CM consult      Choice offered to / List presented to:     DME arranged  [facility]      DME agency  [facility]        Status of service:  Completed, signed off Medicare Important Message given?   (If response is "NO", the following Medicare IM given date fields will be blank) Date Medicare IM given:   Date Additional Medicare IM given:    Discharge Disposition:  HOME W HOME HEALTH SERVICES  Per UR Regulation:    If discussed at Long Length of Stay Meetings, dates discussed:    Comments:  03/18/12 Rosemary Holms RN BSN CM Pt needs to be DC'd with O2 due to COPD exacerbation. Pt sats dropped to 86% and rebounded to 96% when O2 reapplied. Son to pick pt up when dc'd  03/17/12 Rosemary Holms RN BSN CM Pt stated he is alone too much and may want a counselor. Per CSW, Santa Genera as already left pt resources.

## 2012-03-17 NOTE — Progress Notes (Signed)
Subjective: Breathing easier today.  Objective: Vital signs in last 24 hours: Filed Vitals:   03/17/12 0500 03/17/12 0710 03/17/12 0800 03/17/12 1139  BP: 154/85  140/80   Pulse: 79  80   Temp: 97.1 F (36.2 C)  97.1 F (36.2 C)   TempSrc: Oral  Oral   Resp: 18  24   Height:   5\' 9"  (1.753 m)   Weight:   102.059 kg (225 lb)   SpO2: 93% 92% 94% 97%   Weight change:   Intake/Output Summary (Last 24 hours) at 03/17/12 1357 Last data filed at 03/17/12 0800  Gross per 24 hour  Intake    240 ml  Output    850 ml  Net   -610 ml   General: Slightly more confused than yesterday. Cooperative. Very tremulous. Lungs: Clear to auscultation bilaterally without wheeze rhonchi or rales Cardiovascular regular rate rhythm without murmurs gallops rubs Abdomen soft nontender nondistended Extremities no clubbing cyanosis or edema  Lab Results: Basic Metabolic Panel:  Lab 03/16/12 1610  NA 132*  K 3.4*  CL 91*  CO2 23  GLUCOSE 151*  BUN 7  CREATININE 0.67  CALCIUM 9.0  MG --  PHOS --   Liver Function Tests:  Lab 03/16/12 1327  AST 44*  ALT 34  ALKPHOS 48  BILITOT 0.3  PROT 8.3  ALBUMIN 3.4*   No results found for this basename: LIPASE:2,AMYLASE:2 in the last 168 hours No results found for this basename: AMMONIA:2 in the last 168 hours CBC:  Lab 03/16/12 1327  WBC 8.3  NEUTROABS 6.3  HGB 15.0  HCT 44.3  MCV 107.5*  PLT 235   Cardiac Enzymes:  Lab 03/16/12 1330  CKTOTAL --  CKMB --  CKMBINDEX --  TROPONINI <0.30   BNP: No results found for this basename: PROBNP:3 in the last 168 hours D-Dimer: No results found for this basename: DDIMER:2 in the last 168 hours CBG: No results found for this basename: GLUCAP:6 in the last 168 hours Hemoglobin A1C: No results found for this basename: HGBA1C in the last 168 hours Fasting Lipid Panel: No results found for this basename: CHOL,HDL,LDLCALC,TRIG,CHOLHDL,LDLDIRECT in the last 960 hours Thyroid Function Tests: No  results found for this basename: TSH,T4TOTAL,FREET4,T3FREE,THYROIDAB in the last 168 hours Coagulation: No results found for this basename: LABPROT:4,INR:4 in the last 168 hours Anemia Panel:  Lab 03/16/12 1550  VITAMINB12 396  FOLATE --  FERRITIN --  TIBC --  IRON --  RETICCTPCT --   Micro Results: No results found for this or any previous visit (from the past 240 hour(s)). Studies/Results: Dg Chest 2 View  03/16/2012  *RADIOLOGY REPORT*  Clinical Data: Cough.  Shortness of breath.  Emphysema.  CHEST - 2 VIEW  Comparison: Chest x-ray dated 06/18/2009 and 07/24/2006  Findings: Heart size and pulmonary vascularity are normal.  No infiltrates or effusions.  Increased bronchitic changes bilaterally which could be acute or chronic.  No acute osseous abnormality.  IMPRESSION: Increased bronchitic changes.   Original Report Authenticated By: Francene Boyers, M.D.    Scheduled Meds:   . albuterol  2.5 mg Nebulization Q4H  . dextromethorphan-guaiFENesin  1 tablet Oral BID  . enoxaparin (LOVENOX) injection  40 mg Subcutaneous Q24H  . ipratropium  0.5 mg Nebulization QID  . levofloxacin  500 mg Oral Daily  . nicotine  21 mg Transdermal Daily  . oxymetazoline  2 spray Each Nare BID  . predniSONE  40 mg Oral BID  . thiamine  100  mg Oral Daily   Continuous Infusions:  PRN Meds:.acetaminophen, acetaminophen, alprazolam, alum & mag hydroxide-simeth, HYDROcodone-acetaminophen, ondansetron (ZOFRAN) IV, ondansetron Assessment/Plan: Active Problems:  Acute bronchitis  COPD exacerbation  Acute sinusitis  Alcohol consumption heavy  Leg pain, bilateral  Chronic edema of lower extremity  Multiple lung nodules  Tobacco abuse  Macrocytosis without anemia  Personal history of DVT (deep vein thrombosis)  Hyponatremia  Patient's breathing is improving, but appears very tremulous today. We'll schedule Xanax 4 times a day. Does not desire alcohol treatment or moderation. Doppler of the legs negative.  Will change albuterol to Xopenex and decrease frequency. Taper steroids. Home tomorrow if stable.   LOS: 1 day   Wesley Ho 03/17/2012, 1:57 PM

## 2012-03-18 MED ORDER — LEVOFLOXACIN 500 MG PO TABS: 500.0000 mg | ORAL_TABLET | Freq: Every day | ORAL | Status: DC

## 2012-03-18 MED ORDER — BENZONATATE 200 MG PO CAPS: 200.0000 mg | ORAL_CAPSULE | Freq: Three times a day (TID) | ORAL | Status: DC | PRN

## 2012-03-18 MED ORDER — IPRATROPIUM-ALBUTEROL 18-103 MCG/ACT IN AERO: 2.0000 | INHALATION_SPRAY | RESPIRATORY_TRACT | Status: DC | PRN

## 2012-03-18 MED ORDER — PREDNISONE 10 MG PO TABS: ORAL_TABLET | ORAL | Status: DC

## 2012-03-18 NOTE — Discharge Summary (Signed)
Physician Discharge Summary  Wesley Ho:[id number] DOB: 01/30/1956 DOA: 03/16/2012  PCP: Kirk Ruths, MD  Admit date: 03/16/2012 Discharge date: 03/18/2012  Time spent: 45 minutes  Recommendations for Outpatient Follow-up:  1. Patient will follow up with primary care doctor in 2 weeks 2. He will be discharged home on oxygen to be re evaluated by pcp  Discharge Diagnoses:  Active Problems:  Chronic edema of lower extremity  Multiple lung nodules  Acute bronchitis  Acute sinusitis  COPD exacerbation  Alcohol consumption heavy  Tobacco abuse  Macrocytosis without anemia  Leg pain, bilateral  Personal history of DVT (deep vein thrombosis)  Hyponatremia   Discharge Condition: improved  Diet recommendation: low salt  Filed Weights   03/17/12 0800  Weight: 102.059 kg (225 lb)    History of present illness:  Wesley Ho is an 57 y.o. male who presents with a five-day history of worsening cough and shortness of breath. The cough is nonproductive. She also has a headache and sinus congestion. Postnasal drip. Low-grade fever here in the emergency room. He has been unable to ambulate much due to to the shortness of breath. He smokes about 2 packs of cigarettes a day. He also drinks heavily. He has a history of DVT and is worried that he may have recurrence do to return of leg pains. Chest x-ray in the emergency room shows no pneumonia. Initial oxygen saturation on room air was 90. His respiratory rate initially was 36. After bronchodilators oxygen and steroids, his respiratory rate has come down and his oxygenation has improved. Initially, his heart rate also was 117. He had a flu vaccine this year.   Hospital Course:  This gentleman was admitted to the hospital with shortness of breath and cough. He was found to have an acute exacerbation of copd, likely due to acute bronchitis.  He was started on standard therapy with antibiotics, steroids and neb treatments.  He has  significantly improved and is only requiring minimal oxygen on ambulation.  He is no longer wheezing.  I had an extensive conversation regarding the importance of tobacco cessation. Patient is not forthcoming about alcohol consumption.  He did have some tremors in the hospital and was started on CIWA protocol.  They have since improved and he does not have any evidence of delirium tremens at this time.  He has been given information from social work regarding treatment options in the community for alcoholism.  He is felt to be approaching his baseline and is appropriate for discharge home today.  Procedures:  none  Consultations:  none  Discharge Exam: Filed Vitals:   03/17/12 2158 03/18/12 0605 03/18/12 0737 03/18/12 1208  BP: 131/98 102/65    Pulse: 79 92    Temp: 97.3 F (36.3 C) 97.6 F (36.4 C)    TempSrc: Oral Oral    Resp: 20 20    Height:      Weight:      SpO2: 96% 97% 99% 92%    General: NAD, very mild tremor in hands which patient reports as chronic Cardiovascular: S1, s2 RRR Respiratory: CTA B, without wheezes.  Diminished breath sounds b/l  Discharge Instructions  Discharge Orders    Future Orders Please Complete By Expires   Diet - low sodium heart healthy      Increase activity slowly      Call MD for:  temperature >100.4      Call MD for:  difficulty breathing, headache or visual disturbances  Medication List     As of 03/18/2012  3:03 PM    TAKE these medications         albuterol-ipratropium 18-103 MCG/ACT inhaler   Commonly known as: COMBIVENT   Inhale 2 puffs into the lungs every 4 (four) hours as needed for wheezing or shortness of breath.      alprazolam 2 MG tablet   Commonly known as: XANAX   Take 2 mg by mouth 4 (four) times daily. Takes every 6 hours as needed for anxiety      benzonatate 200 MG capsule   Commonly known as: TESSALON   Take 1 capsule (200 mg total) by mouth 3 (three) times daily as needed for cough.       furosemide 40 MG tablet   Commonly known as: LASIX   Take 40 mg by mouth 2 (two) times daily.      levofloxacin 500 MG tablet   Commonly known as: LEVAQUIN   Take 1 tablet (500 mg total) by mouth daily.      predniSONE 10 MG tablet   Commonly known as: DELTASONE   Take 40mg  po daily for 2 days then 30mg  po daily for 2 days then 20mg  po daily for 2 days, then 10mg  po daily for 2 days then stop      spironolactone 50 MG tablet   Commonly known as: ALDACTONE   Take 50 mg by mouth daily.           Follow-up Information    Follow up with Kirk Ruths, MD. Schedule an appointment as soon as possible for a visit in 2 weeks.   Contact information:   [address] [place] [address] [phone]           The results of significant diagnostics from this hospitalization (including imaging, microbiology, ancillary and laboratory) are listed below for reference.    Significant Diagnostic Studies: Dg Chest 2 View  03/16/2012  *RADIOLOGY REPORT*  Clinical Data: Cough.  Shortness of breath.  Emphysema.  CHEST - 2 VIEW  Comparison: Chest x-ray dated 06/18/2009 and 07/24/2006  Findings: Heart size and pulmonary vascularity are normal.  No infiltrates or effusions.  Increased bronchitic changes bilaterally which could be acute or chronic.  No acute osseous abnormality.  IMPRESSION: Increased bronchitic changes.   Original Report Authenticated By: Francene Boyers, M.D.    US Venous Img Lower Bilateral  03/17/2012  *RADIOLOGY REPORT*  Clinical Data: History of DVT, bilateral leg pain evaluate for recurrent DVT  BILATERAL LOWER EXTREMITY VENOUS DUPLEX ULTRASOUND  Technique:  Gray-scale sonography with graded compression, as well as color Doppler and duplex ultrasound, were performed to evaluate the deep venous system of both lower extremities from the level of the common femoral vein through the popliteal and proximal calf veins.  Spectral Doppler was utilized to  evaluate flow at rest and with distal augmentation maneuvers.  Comparison:  Prior duplex ultrasound 08/09/2009  Findings:  Normal compressibility of bilateral common femoral, superficial femoral, and popliteal veins is demonstrated, as well as the visualized proximal calf veins.  No filling defects to suggest DVT on grayscale or color Doppler imaging.  Doppler waveforms show normal direction of venous flow, normal respiratory phasicity and response to augmentation. The great saphenous veins are identified and are compressible with normal vascular flow.  No superficial vein thrombosis.  IMPRESSION: No evidence of deep vein thrombosis in either lower extremity.   Original Report Authenticated By: Malachy Moan, M.D.  Microbiology: No results found for this or any previous visit (from the past 240 hour(s)).   Labs: Basic Metabolic Panel:  Lab 03/16/12 1610  NA 132*  K 3.4*  CL 91*  CO2 23  GLUCOSE 151*  BUN 7  CREATININE 0.67  CALCIUM 9.0  MG --  PHOS --   Liver Function Tests:  Lab 03/16/12 1327  AST 44*  ALT 34  ALKPHOS 48  BILITOT 0.3  PROT 8.3  ALBUMIN 3.4*   No results found for this basename: LIPASE:5,AMYLASE:5 in the last 168 hours No results found for this basename: AMMONIA:5 in the last 168 hours CBC:  Lab 03/16/12 1327  WBC 8.3  NEUTROABS 6.3  HGB 15.0  HCT 44.3  MCV 107.5*  PLT 235   Cardiac Enzymes:  Lab 03/16/12 1330  CKTOTAL --  CKMB --  CKMBINDEX --  TROPONINI <0.30   BNP: BNP (last 3 results) No results found for this basename: PROBNP:3 in the last 8760 hours CBG: No results found for this basename: GLUCAP:5 in the last 168 hours     Signed:  Advait Buice  [facility] 03/18/2012, 3:03 PM

## 2012-03-18 NOTE — Clinical Social Work Note (Signed)
Per RN CM, patient requested information and referral to outpatient counseling.  CSW gave patient list of community resources in [facility] and highlighted resources for outpatient counseling.  Santa Genera, LCSW Clinical Social Worker [phone])

## 2012-03-18 NOTE — Progress Notes (Signed)
Corrected O2 level: SaO2 ambulating without O2-86%

## 2012-03-18 NOTE — Progress Notes (Signed)
Pt discharged via w/c to private vehicle driven by family member. O2 supply. Rx's, and discharge instructions with pt.  Recently medicated, and voices no c/o pain or discomfort.

## 2012-03-18 NOTE — Progress Notes (Signed)
O2 sat levels for oxygen qualification: Bedrest no O2- 92% Bedrest O2 2L/m via N/C- 92% Ambulating O2 2L/min via N/C 92-96% Ambulating without O2- 89%

## 2014-04-22 DIAGNOSIS — R609 Edema, unspecified: Secondary | ICD-10-CM | POA: Diagnosis not present

## 2014-04-22 DIAGNOSIS — F419 Anxiety disorder, unspecified: Secondary | ICD-10-CM | POA: Diagnosis not present

## 2014-04-22 DIAGNOSIS — I10 Essential (primary) hypertension: Secondary | ICD-10-CM | POA: Diagnosis not present

## 2014-04-22 DIAGNOSIS — Z6841 Body Mass Index (BMI) 40.0 and over, adult: Secondary | ICD-10-CM | POA: Diagnosis not present

## 2014-06-23 ENCOUNTER — Emergency Department (HOSPITAL_COMMUNITY): Payer: Medicare Other

## 2014-06-23 ENCOUNTER — Encounter (HOSPITAL_COMMUNITY): Payer: Self-pay

## 2014-06-23 ENCOUNTER — Emergency Department (HOSPITAL_COMMUNITY)
Admission: EM | Admit: 2014-06-23 | Discharge: 2014-06-23 | Disposition: A | Discharge status: Home / Self Care | Payer: Medicare Other | Attending: Emergency Medicine | Admitting: Emergency Medicine

## 2014-06-23 DIAGNOSIS — Y9289 Other specified places as the place of occurrence of the external cause: Secondary | ICD-10-CM | POA: Insufficient documentation

## 2014-06-23 DIAGNOSIS — Y998 Other external cause status: Secondary | ICD-10-CM | POA: Insufficient documentation

## 2014-06-23 DIAGNOSIS — Z87891 Personal history of nicotine dependence: Secondary | ICD-10-CM | POA: Insufficient documentation

## 2014-06-23 DIAGNOSIS — R296 Repeated falls: Secondary | ICD-10-CM | POA: Diagnosis not present

## 2014-06-23 DIAGNOSIS — W010XXA Fall on same level from slipping, tripping and stumbling without subsequent striking against object, initial encounter: Secondary | ICD-10-CM | POA: Insufficient documentation

## 2014-06-23 DIAGNOSIS — Z862 Personal history of diseases of the blood and blood-forming organs and certain disorders involving the immune mechanism: Secondary | ICD-10-CM | POA: Diagnosis not present

## 2014-06-23 DIAGNOSIS — S5292XA Unspecified fracture of left forearm, initial encounter for closed fracture: Secondary | ICD-10-CM

## 2014-06-23 DIAGNOSIS — S6992XA Unspecified injury of left wrist, hand and finger(s), initial encounter: Secondary | ICD-10-CM | POA: Diagnosis present

## 2014-06-23 DIAGNOSIS — S52592A Other fractures of lower end of left radius, initial encounter for closed fracture: Secondary | ICD-10-CM | POA: Insufficient documentation

## 2014-06-23 DIAGNOSIS — Z79899 Other long term (current) drug therapy: Secondary | ICD-10-CM | POA: Diagnosis not present

## 2014-06-23 DIAGNOSIS — Y9389 Activity, other specified: Secondary | ICD-10-CM | POA: Insufficient documentation

## 2014-06-23 DIAGNOSIS — J439 Emphysema, unspecified: Secondary | ICD-10-CM | POA: Diagnosis not present

## 2014-06-23 DIAGNOSIS — Z86718 Personal history of other venous thrombosis and embolism: Secondary | ICD-10-CM | POA: Diagnosis not present

## 2014-06-23 DIAGNOSIS — S52502A Unspecified fracture of the lower end of left radius, initial encounter for closed fracture: Secondary | ICD-10-CM | POA: Diagnosis not present

## 2014-06-23 LAB — CBC WITH DIFFERENTIAL/PLATELET
BASOS PCT: 0 % (ref 0–1)
Basophils Absolute: 0 10*3/uL (ref 0.0–0.1)
EOS ABS: 0.3 10*3/uL (ref 0.0–0.7)
Eosinophils Relative: 6 % — ABNORMAL HIGH (ref 0–5)
HEMATOCRIT: 36.2 % — AB (ref 39.0–52.0)
HEMOGLOBIN: 12.2 g/dL — AB (ref 13.0–17.0)
LYMPHS ABS: 1.8 10*3/uL (ref 0.7–4.0)
Lymphocytes Relative: 32 % (ref 12–46)
MCH: 37.4 pg — AB (ref 26.0–34.0)
MCHC: 33.7 g/dL (ref 30.0–36.0)
MCV: 111 fL — ABNORMAL HIGH (ref 78.0–100.0)
MONO ABS: 0.4 10*3/uL (ref 0.1–1.0)
MONOS PCT: 7 % (ref 3–12)
NEUTROS PCT: 55 % (ref 43–77)
Neutro Abs: 3.1 10*3/uL (ref 1.7–7.7)
Platelets: 111 10*3/uL — ABNORMAL LOW (ref 150–400)
RBC: 3.26 MIL/uL — AB (ref 4.22–5.81)
RDW: 14.5 % (ref 11.5–15.5)
Smear Review: DECREASED
WBC: 5.6 10*3/uL (ref 4.0–10.5)

## 2014-06-23 LAB — BASIC METABOLIC PANEL
Anion gap: 11 (ref 5–15)
BUN: 7 mg/dL (ref 6–23)
CALCIUM: 8.3 mg/dL — AB (ref 8.4–10.5)
CO2: 24 mmol/L (ref 19–32)
CREATININE: 0.65 mg/dL (ref 0.50–1.35)
Chloride: 104 mmol/L (ref 96–112)
GFR calc Af Amer: >90 mL/min (ref >90)
GFR calc non Af Amer: >90 mL/min (ref >90)
GLUCOSE: 136 mg/dL — AB (ref 70–99)
Potassium: 3.5 mmol/L (ref 3.5–5.1)
Sodium: 139 mmol/L (ref 135–145)

## 2014-06-23 MED ORDER — OXYCODONE-ACETAMINOPHEN 5-325 MG PO TABS
2.0000 | ORAL_TABLET | Freq: Once | ORAL | Status: AC
  Administered 2014-06-23: 2 via ORAL
  Filled 2014-06-23: qty 2

## 2014-06-23 MED ORDER — OXYCODONE-ACETAMINOPHEN 5-325 MG PO TABS: 1.0000 | ORAL_TABLET | ORAL | Status: DC | PRN

## 2014-06-23 NOTE — ED Notes (Signed)
Pt reports has had frequent falls for " some time now."  Pt says loses his balance easily.  Pt states " I lose my attention easily."  Pt reports tripped over something in the floor today and c/o pain to left hand.  Hand very swollen and painful to touch.  Pt can wiggle fingers but unable to make a fist.  Radial pulse present, hand warm to touch, capillary refill WNL.

## 2014-06-23 NOTE — Discharge Instructions (Signed)
You have fractured your left radius bone. Ice, elevate, wrist splint. Follow-up with orthopedic surgeon next week. Phone number given. Pain medication

## 2014-06-23 NOTE — ED Provider Notes (Signed)
CSN: [id number]     Arrival date & time 06/23/14  1658 History   First MD Initiated Contact with Patient 06/23/14 1807     Chief Complaint  Patient presents with  . frequent falls   . Hand Pain     (Consider location/radiation/quality/duration/timing/severity/associated sxs/prior Treatment) HPI...... patient reports intermittent falls secondary to loss of balance. Recently he fell and hyperextended his left wrist. Now complains of pain in same. No true syncope. No loss of consciousness or neurological deficits. No chest pain or dyspnea. Pain is moderate in intensity. Positioning and palpation make pain worse  Past Medical History  Diagnosis Date  . DVT (deep venous thrombosis) 09/28/2010  . H/O Thrombocytopenia 09/28/2010  . Chronic edema of lower extremity 09/28/2010  . Multiple lung nodules 09/28/2010  . Fatigue   . Emphysema of lung   . Bronchitis    Past Surgical History  Procedure Laterality Date  . Cholecystectomy    . Mouth surgery     Family History  Problem Relation Age of Onset  . Diabetes Mother   . Clotting disorder Father    History  Substance Use Topics  . Smoking status: Former Smoker -- 1.00 packs/day for 40 years  . Smokeless tobacco: Not on file  . Alcohol Use: 14.4 oz/week    24 Cans of beer per week     Comment: daily- 2 cans of beer    Review of Systems  All other systems reviewed and are negative.     Allergies  Review of patient's allergies indicates no known allergies.  Home Medications   Prior to Admission medications   Medication Sig Start Date End Date Taking? Authorizing Provider  lisinopril (PRINIVIL,ZESTRIL) 5 MG tablet Take 5 mg by mouth daily. 06/20/14  Yes Historical Provider, MD  albuterol-ipratropium (COMBIVENT) 18-103 MCG/ACT inhaler Inhale 2 puffs into the lungs every 4 (four) hours as needed for wheezing or shortness of breath. Patient not taking: Reported on 06/23/2014 03/18/12   Erick Blinks, MD  alprazolam Prudy Feeler) 2 MG  tablet Take 2 mg by mouth every 6 (six) hours as needed for anxiety.     Historical Provider, MD  benzonatate (TESSALON) 200 MG capsule Take 1 capsule (200 mg total) by mouth 3 (three) times daily as needed for cough. Patient not taking: Reported on 06/23/2014 03/18/12   Erick Blinks, MD  levofloxacin (LEVAQUIN) 500 MG tablet Take 1 tablet (500 mg total) by mouth daily. Patient not taking: Reported on 06/23/2014 03/18/12   Erick Blinks, MD  oxyCODONE-acetaminophen (PERCOCET) 5-325 MG per tablet Take 1-2 tablets by mouth every 4 (four) hours as needed. 06/23/14   Donnetta Hutching, MD  predniSONE (DELTASONE) 10 MG tablet Take  po daily for 2 days then  po daily for 2 days then  po daily for 2 days, then  po daily for 2 days then stop Patient not taking: Reported on 06/23/2014 03/18/12   Erick Blinks, MD   BP 143/79 mmHg  Pulse 80  Temp(Src) 97.8 F (36.6 C) (Oral)  Resp 11  Ht  (1.753 m)  Wt 235 lb (106.595 kg)  BMI 34.69 kg/m2  SpO2 95% Physical Exam  Constitutional: He is oriented to person, place, and time.  Obese  HENT:  Head: Normocephalic and atraumatic.  Eyes: Conjunctivae and EOM are normal. Pupils are equal, round, and reactive to light.  Neck: Normal range of motion. Neck supple.  Cardiovascular: Normal rate and regular rhythm.   Pulmonary/Chest: Effort normal and breath sounds normal.  Abdominal: Soft. Bowel sounds are normal.  Musculoskeletal:  Left upper extremity: Tenderness surrounding the left wrist with associated edema especially on radial side  Neurological: He is alert and oriented to person, place, and time.  Skin: Skin is warm and dry.  Psychiatric: He has a normal mood and affect. His behavior is normal.  Nursing note and vitals reviewed.   ED Course  Procedures (including critical care time) Labs Review Labs Reviewed  BASIC METABOLIC PANEL - Abnormal; Notable for the following:    Glucose, Bld 136 (*)    Calcium 8.3 (*)    All other  components within normal limits  CBC WITH DIFFERENTIAL/PLATELET - Abnormal; Notable for the following:    RBC 3.26 (*)    Hemoglobin 12.2 (*)    HCT 36.2 (*)    MCV 111.0 (*)    MCH 37.4 (*)    Platelets 111 (*)    Eosinophils Relative 6 (*)    All other components within normal limits    Imaging Review Dg Wrist Complete Left  06/23/2014   CLINICAL DATA:  59 year old male with left wrist pain and multiple recent falls  EXAM: LEFT WRIST - COMPLETE 3+ VIEW  COMPARISON:  Concurrently obtained radiographs of the left hand  FINDINGS: Impacted and mildly comminuted distal radius fracture with dorsal tilt of the distal fracture fragment. There is associated surrounding soft tissue swelling. The carpus remains intact and congruent. No evidence of scaphoid fracture. 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 Soft tissue swelling extends into the hand and up the forearm.  IMPRESSION: Impacted and mildly comminuted distal radius fracture with dorsal tilt of the fracture fragments.  Extensive associated soft tissue swelling.   Electronically Signed   By: Malachy MoanHeath  McCullough M.D.   On: 06/23/2014 19:54   Dg Hand Complete Left  06/23/2014   CLINICAL DATA:  Left hand pain following trip and fall with difficulty moving fingers, initial encounter  EXAM: LEFT HAND - COMPLETE 3+ VIEW  COMPARISON:  None.  FINDINGS: There is an impacted comminuted fracture of the distal radius identified. Considerable soft tissue swelling in the hand and wrist is noted. Mild posterior angulation at the fracture site is seen. No distal ulnar fracture is seen. No other fractures are noted.  IMPRESSION: Distal radial fracture with impaction and angulation at the fracture site.   Electronically Signed   By: Alcide CleverMark  Lukens M.D.   On: 06/23/2014 18:03     EKG Interpretation   Date/Time:  Wednesday June 23 2014 18:51:25 EDT Ventricular Rate:  86 PR Interval:  157 QRS Duration: 92 QT Interval:  401 QTC Calculation: 480 R  Axis:   78 Text Interpretation:  Sinus rhythm Low voltage, precordial leads  Borderline prolonged QT interval Baseline wander in lead(s) I II aVR  Confirmed by Adriana SimasOOK  MD, Mirella Gueye ([id number]) on 06/23/2014 9:02:00 PM      MDM   Final diagnoses:  Fracture of left radius, closed, initial encounter    Plain films show a distal radial fracture with impaction and angulation. I will mobilize wrist. Pain management. Follow-up with Dr. Hilda LiasKeeling. Discharge medications Percocet.  No evidence of stroke or MI    Donnetta HutchingBrian Abrielle Finck, MD 06/23/14 2118

## 2014-07-01 DIAGNOSIS — S52592A Other fractures of lower end of left radius, initial encounter for closed fracture: Secondary | ICD-10-CM | POA: Diagnosis not present

## 2014-07-01 DIAGNOSIS — Z6841 Body Mass Index (BMI) 40.0 and over, adult: Secondary | ICD-10-CM | POA: Diagnosis not present

## 2014-07-01 DIAGNOSIS — Z5181 Encounter for therapeutic drug level monitoring: Secondary | ICD-10-CM | POA: Diagnosis not present

## 2014-07-01 DIAGNOSIS — M25531 Pain in right wrist: Secondary | ICD-10-CM | POA: Diagnosis not present

## 2014-07-07 ENCOUNTER — Encounter (HOSPITAL_COMMUNITY): Payer: Self-pay

## 2014-07-07 ENCOUNTER — Observation Stay (HOSPITAL_COMMUNITY)
Admission: EM | Admit: 2014-07-07 | Discharge: 2014-07-09 | Disposition: A | Discharge status: Home / Self Care | Payer: Medicare Other | Attending: Internal Medicine | Admitting: Internal Medicine

## 2014-07-07 ENCOUNTER — Emergency Department (HOSPITAL_COMMUNITY): Payer: Medicare Other

## 2014-07-07 DIAGNOSIS — S5292XA Unspecified fracture of left forearm, initial encounter for closed fracture: Secondary | ICD-10-CM | POA: Diagnosis present

## 2014-07-07 DIAGNOSIS — R531 Weakness: Secondary | ICD-10-CM | POA: Diagnosis not present

## 2014-07-07 DIAGNOSIS — D696 Thrombocytopenia, unspecified: Secondary | ICD-10-CM | POA: Insufficient documentation

## 2014-07-07 DIAGNOSIS — Z87891 Personal history of nicotine dependence: Secondary | ICD-10-CM | POA: Insufficient documentation

## 2014-07-07 DIAGNOSIS — R42 Dizziness and giddiness: Secondary | ICD-10-CM | POA: Diagnosis not present

## 2014-07-07 DIAGNOSIS — R600 Localized edema: Secondary | ICD-10-CM | POA: Diagnosis present

## 2014-07-07 DIAGNOSIS — R110 Nausea: Secondary | ICD-10-CM | POA: Diagnosis not present

## 2014-07-07 DIAGNOSIS — R269 Unspecified abnormalities of gait and mobility: Principal | ICD-10-CM | POA: Insufficient documentation

## 2014-07-07 DIAGNOSIS — R296 Repeated falls: Secondary | ICD-10-CM | POA: Diagnosis not present

## 2014-07-07 DIAGNOSIS — F419 Anxiety disorder, unspecified: Secondary | ICD-10-CM | POA: Diagnosis not present

## 2014-07-07 DIAGNOSIS — R2681 Unsteadiness on feet: Secondary | ICD-10-CM

## 2014-07-07 DIAGNOSIS — Z86718 Personal history of other venous thrombosis and embolism: Secondary | ICD-10-CM | POA: Insufficient documentation

## 2014-07-07 DIAGNOSIS — Z9181 History of falling: Secondary | ICD-10-CM | POA: Diagnosis not present

## 2014-07-07 DIAGNOSIS — R197 Diarrhea, unspecified: Secondary | ICD-10-CM | POA: Insufficient documentation

## 2014-07-07 DIAGNOSIS — K529 Noninfective gastroenteritis and colitis, unspecified: Secondary | ICD-10-CM | POA: Insufficient documentation

## 2014-07-07 DIAGNOSIS — Z79899 Other long term (current) drug therapy: Secondary | ICD-10-CM | POA: Insufficient documentation

## 2014-07-07 DIAGNOSIS — R739 Hyperglycemia, unspecified: Secondary | ICD-10-CM | POA: Diagnosis present

## 2014-07-07 DIAGNOSIS — Z87828 Personal history of other (healed) physical injury and trauma: Secondary | ICD-10-CM | POA: Insufficient documentation

## 2014-07-07 DIAGNOSIS — J439 Emphysema, unspecified: Secondary | ICD-10-CM | POA: Diagnosis not present

## 2014-07-07 DIAGNOSIS — F1010 Alcohol abuse, uncomplicated: Secondary | ICD-10-CM | POA: Diagnosis present

## 2014-07-07 DIAGNOSIS — J449 Chronic obstructive pulmonary disease, unspecified: Secondary | ICD-10-CM | POA: Insufficient documentation

## 2014-07-07 DIAGNOSIS — Z792 Long term (current) use of antibiotics: Secondary | ICD-10-CM | POA: Insufficient documentation

## 2014-07-07 DIAGNOSIS — D539 Nutritional anemia, unspecified: Secondary | ICD-10-CM | POA: Diagnosis present

## 2014-07-07 DIAGNOSIS — R404 Transient alteration of awareness: Secondary | ICD-10-CM | POA: Diagnosis not present

## 2014-07-07 HISTORY — DX: Noninfective gastroenteritis and colitis, unspecified: K52.9

## 2014-07-07 HISTORY — DX: Unspecified fracture of left forearm, initial encounter for closed fracture: S52.92XA

## 2014-07-07 HISTORY — DX: Chronic obstructive pulmonary disease, unspecified: J44.9

## 2014-07-07 LAB — CBC WITH DIFFERENTIAL/PLATELET
BASOS ABS: 0 10*3/uL (ref 0.0–0.1)
BASOS PCT: 1 % (ref 0–1)
EOS PCT: 1 % (ref 0–5)
Eosinophils Absolute: 0.1 10*3/uL (ref 0.0–0.7)
HEMATOCRIT: 35 % — AB (ref 39.0–52.0)
Hemoglobin: 11.5 g/dL — ABNORMAL LOW (ref 13.0–17.0)
LYMPHS ABS: 1.1 10*3/uL (ref 0.7–4.0)
LYMPHS PCT: 18 % (ref 12–46)
MCH: 37 pg — ABNORMAL HIGH (ref 26.0–34.0)
MCHC: 32.9 g/dL (ref 30.0–36.0)
MCV: 112.5 fL — AB (ref 78.0–100.0)
MONO ABS: 0.6 10*3/uL (ref 0.1–1.0)
Monocytes Relative: 9 % (ref 3–12)
Neutro Abs: 4.4 10*3/uL (ref 1.7–7.7)
Neutrophils Relative %: 72 % (ref 43–77)
PLATELETS: 122 10*3/uL — AB (ref 150–400)
RBC: 3.11 MIL/uL — AB (ref 4.22–5.81)
RDW: 15.3 % (ref 11.5–15.5)
WBC: 6.1 10*3/uL (ref 4.0–10.5)

## 2014-07-07 LAB — PROTIME-INR
INR: 1.31 (ref 0.00–1.49)
PROTHROMBIN TIME: 16.4 s — AB (ref 11.6–15.2)

## 2014-07-07 LAB — COMPREHENSIVE METABOLIC PANEL
ALT: 40 U/L (ref 17–63)
ANION GAP: 10 (ref 5–15)
AST: 99 U/L — ABNORMAL HIGH (ref 15–41)
Albumin: 3.1 g/dL — ABNORMAL LOW (ref 3.5–5.0)
Alkaline Phosphatase: 73 U/L (ref 38–126)
BUN: <5 mg/dL — ABNORMAL LOW (ref 6–20)
CALCIUM: 8.3 mg/dL — AB (ref 8.9–10.3)
CO2: 28 mmol/L (ref 22–32)
CREATININE: 0.7 mg/dL (ref 0.61–1.24)
Chloride: 98 mmol/L — ABNORMAL LOW (ref 101–111)
GLUCOSE: 159 mg/dL — AB (ref 70–99)
Potassium: 3.8 mmol/L (ref 3.5–5.1)
SODIUM: 136 mmol/L (ref 135–145)
TOTAL PROTEIN: 7.2 g/dL (ref 6.5–8.1)
Total Bilirubin: 1.2 mg/dL (ref 0.3–1.2)

## 2014-07-07 LAB — VITAMIN B12: VITAMIN B 12: 236 pg/mL (ref 180–914)

## 2014-07-07 LAB — RETICULOCYTES
RBC.: 2.92 MIL/uL — ABNORMAL LOW (ref 4.22–5.81)
Retic Count, Absolute: 73 10*3/uL (ref 19.0–186.0)
Retic Ct Pct: 2.5 % (ref 0.4–3.1)

## 2014-07-07 LAB — FOLATE: FOLATE: 15.1 ng/mL (ref >5.9)

## 2014-07-07 LAB — AMMONIA: Ammonia: 43 umol/L — ABNORMAL HIGH (ref 9–35)

## 2014-07-07 LAB — IRON AND TIBC
IRON: 158 ug/dL (ref 45–182)
Saturation Ratios: 69 % — ABNORMAL HIGH (ref 17.9–39.5)
TIBC: 230 ug/dL — AB (ref 250–450)
UIBC: 72 ug/dL

## 2014-07-07 LAB — FERRITIN: FERRITIN: 581 ng/mL — AB (ref 24–336)

## 2014-07-07 MED ORDER — VITAMIN B-1 100 MG PO TABS
100.0000 mg | ORAL_TABLET | Freq: Once | ORAL | Status: AC
  Administered 2014-07-07: 100 mg via ORAL
  Filled 2014-07-07: qty 1

## 2014-07-07 MED ORDER — SODIUM CHLORIDE 0.9 % IV SOLN
INTRAVENOUS | Status: DC
  Administered 2014-07-07 – 2014-07-09 (×3): via INTRAVENOUS

## 2014-07-07 MED ORDER — ALPRAZOLAM 1 MG PO TABS: 2.0000 mg | ORAL_TABLET | Freq: Four times a day (QID) | ORAL | Status: DC | PRN

## 2014-07-07 MED ORDER — SODIUM CHLORIDE 0.9 % IV BOLUS (SEPSIS)
500.0000 mL | Freq: Once | INTRAVENOUS | Status: AC
  Administered 2014-07-07: 500 mL via INTRAVENOUS

## 2014-07-07 MED ORDER — FOLIC ACID 1 MG PO TABS
1.0000 mg | ORAL_TABLET | Freq: Once | ORAL | Status: AC
  Administered 2014-07-07: 1 mg via ORAL
  Filled 2014-07-07: qty 1

## 2014-07-07 MED ORDER — LORAZEPAM 1 MG PO TABS
1.0000 mg | ORAL_TABLET | Freq: Four times a day (QID) | ORAL | Status: DC | PRN
  Administered 2014-07-08: 1 mg via ORAL
  Filled 2014-07-07: qty 1

## 2014-07-07 MED ORDER — ONDANSETRON HCL 4 MG PO TABS
4.0000 mg | ORAL_TABLET | Freq: Four times a day (QID) | ORAL | Status: DC | PRN
  Administered 2014-07-08: 4 mg via ORAL
  Filled 2014-07-07: qty 1

## 2014-07-07 MED ORDER — LORAZEPAM 2 MG/ML IJ SOLN: 1.0000 mg | Freq: Four times a day (QID) | INTRAMUSCULAR | Status: DC | PRN

## 2014-07-07 MED ORDER — FOLIC ACID 1 MG PO TABS
1.0000 mg | ORAL_TABLET | Freq: Every day | ORAL | Status: DC
  Administered 2014-07-08 – 2014-07-09 (×2): 1 mg via ORAL
  Filled 2014-07-07 (×2): qty 1

## 2014-07-07 MED ORDER — THIAMINE HCL 100 MG/ML IJ SOLN: 100.0000 mg | Freq: Every day | INTRAMUSCULAR | Status: DC

## 2014-07-07 MED ORDER — OXYCODONE HCL 5 MG PO TABS
5.0000 mg | ORAL_TABLET | ORAL | Status: DC | PRN
  Administered 2014-07-07 – 2014-07-08 (×2): 5 mg via ORAL
  Filled 2014-07-07 (×2): qty 1

## 2014-07-07 MED ORDER — ALPRAZOLAM 1 MG PO TABS
2.0000 mg | ORAL_TABLET | Freq: Four times a day (QID) | ORAL | Status: DC | PRN
  Administered 2014-07-07 – 2014-07-08 (×2): 2 mg via ORAL
  Filled 2014-07-07 (×2): qty 2

## 2014-07-07 MED ORDER — HEPARIN SODIUM (PORCINE) 5000 UNIT/ML IJ SOLN
5000.0000 [IU] | Freq: Three times a day (TID) | INTRAMUSCULAR | Status: DC
  Administered 2014-07-07 – 2014-07-09 (×6): 5000 [IU] via SUBCUTANEOUS
  Filled 2014-07-07 (×6): qty 1

## 2014-07-07 MED ORDER — ALUM & MAG HYDROXIDE-SIMETH 200-200-20 MG/5ML PO SUSP: 30.0000 mL | Freq: Four times a day (QID) | ORAL | Status: DC | PRN

## 2014-07-07 MED ORDER — ADULT MULTIVITAMIN W/MINERALS CH
1.0000 | ORAL_TABLET | Freq: Every day | ORAL | Status: DC
  Administered 2014-07-08 – 2014-07-09 (×2): 1 via ORAL
  Filled 2014-07-07 (×2): qty 1

## 2014-07-07 MED ORDER — VITAMIN B-1 100 MG PO TABS
100.0000 mg | ORAL_TABLET | Freq: Every day | ORAL | Status: DC
  Administered 2014-07-08 – 2014-07-09 (×2): 100 mg via ORAL
  Filled 2014-07-07 (×2): qty 1

## 2014-07-07 MED ORDER — ONDANSETRON HCL 4 MG/2ML IJ SOLN
4.0000 mg | Freq: Four times a day (QID) | INTRAMUSCULAR | Status: DC | PRN
  Administered 2014-07-08 (×2): 4 mg via INTRAVENOUS
  Filled 2014-07-07: qty 2

## 2014-07-07 NOTE — ED Notes (Signed)
Unable to ambulate pt. Pt states, he is "too weak to stand on his feet"

## 2014-07-07 NOTE — ED Notes (Signed)
Unable to do standing portion of orthostatic VS, patient not bearing any weight.

## 2014-07-07 NOTE — ED Notes (Signed)
Patient c/o of diarrhea times one week. And frequent falls.

## 2014-07-07 NOTE — ED Notes (Signed)
Report given to Lida, all questions answerred.

## 2014-07-07 NOTE — H&P (Signed)
[facility] History and Physical  Wesley Ho ZOX:[id number] DOB: 06-Oct-1955 DOA: 07/07/2014  Referring physician: Dr. Pecola Leisure - [facility] PCP: Cassell Smiles., MD   Chief Complaint: generalized weakness  HPI: Wesley Ho is a 59 y.o. male  Progressive and constant generalized weakness. Pt reports multiple falls at home. Seen a few weeks ago in ED after falling and sustaining a L arm fracture. Lives at home by self.  Poor sleep at night. Denies unintentional weight loss, fevers, night sweats, chest pain, shortness of breath, palpitations, focal weakness. Diarrhea for past 1 week. Approximately 3-6 episodes daily. Chronic ongoing issue for pt. Episodes of diarrhea typically last 2-3 days. Associated w/ "dry heaving."  2-4 cans of beer daily.   LE swelling at baseline. No home O2  Disabled from COPD  Review of Systems:  Constitutional:  No weight loss, night sweats, Fevers, chills,  HEENT:  No headaches, Difficulty swallowing,Tooth/dental problems,Sore throat,  No sneezing, itching, ear ache, nasal congestion, post nasal drip,  Cardio-vascular:  No chest pain, Orthopnea, PND, anasarca, palpitations  GI: Per HPI Resp:   No shortness of breath with exertion or at rest. No excess mucus, no productive cough, No non-productive cough, No coughing up of blood.No change in color of mucus.No wheezing.No chest wall deformity  Skin:  no rash or lesions.  GU:  no dysuria, change in color of urine, no urgency or frequency. No flank pain.  Musculoskeletal:   No joint pain or swelling. No decreased range of motion. No back pain.  Psych:  No change in mood or affect. No depression or anxiety. No memory loss.   Past Medical History  Diagnosis Date  . DVT (deep venous thrombosis) 09/28/2010  . H/O Thrombocytopenia 09/28/2010  . Chronic edema of lower extremity 09/28/2010  . Multiple lung nodules 09/28/2010  . Fatigue   . Emphysema of lung   . Bronchitis   . COPD (chronic obstructive  pulmonary disease)    Past Surgical History  Procedure Laterality Date  . Cholecystectomy    . Mouth surgery     Social History:  reports that he has quit smoking. He does not have any smokeless tobacco history on file. He reports that he drinks about 14.4 oz of alcohol per week. He reports that he does not use illicit drugs.  No Known Allergies  Family History  Problem Relation Age of Onset  . Diabetes Mother   . Clotting disorder Father      Prior to Admission medications   Medication Sig Start Date End Date Taking? Authorizing Provider  acetaminophen (TYLENOL) 500 MG tablet Take 500-1,000 mg by mouth every 6 (six) hours as needed for mild pain.   Yes Historical Provider, MD  alprazolam Prudy Feeler) 2 MG tablet Take 2 mg by mouth every 6 (six) hours as needed for anxiety.    Yes Historical Provider, MD  oxyCODONE-acetaminophen (PERCOCET) 5-325 MG per tablet Take 1-2 tablets by mouth every 4 (four) hours as needed. 06/23/14  Yes Donnetta Hutching, MD  albuterol-ipratropium (COMBIVENT) 18-103 MCG/ACT inhaler Inhale 2 puffs into the lungs every 4 (four) hours as needed for wheezing or shortness of breath. Patient not taking: Reported on 06/23/2014 03/18/12   Erick Blinks, MD  benzonatate (TESSALON) 200 MG capsule Take 1 capsule (200 mg total) by mouth 3 (three) times daily as needed for cough. Patient not taking: Reported on 06/23/2014 03/18/12   Erick Blinks, MD  levofloxacin (LEVAQUIN) 500 MG tablet Take 1 tablet (500 mg total) by mouth daily.  Patient not taking: Reported on 06/23/2014 03/18/12   Erick BlinksJehanzeb Memon, MD  predniSONE (DELTASONE) 10 MG tablet Take 40mg  po daily for 2 days then 30mg  po daily for 2 days then 20mg  po daily for 2 days, then 10mg  po daily for 2 days then stop Patient not taking: Reported on 06/23/2014 03/18/12   Erick BlinksJehanzeb Memon, MD   Physical Exam: Filed Vitals:   07/07/14 1511 07/07/14 1530 07/07/14 1600 07/07/14 1652  BP: 139/76 134/61 144/73 141/74  Pulse: 100 97 97 97    Temp:    98.3 F (36.8 C)  TempSrc:    Oral  Resp: 18 16 18 18   Height:      Weight:      SpO2: 95% 94% 93% 95%    Wt Readings from Last 3 Encounters:  07/07/14 104.327 kg (230 lb)  06/23/14 106.595 kg (235 lb)  03/17/12 102.059 kg (225 lb)    General:  Appears calm and comfortable Eyes:  PERRL, normal lids, irises & conjunctiva ENT:  grossly normal hearing, lips & tongue Neck:  no LAD, masses or thyromegaly Cardiovascular:  RRR, no m/r/g. 2+ bilateral lotion edema. Telemetry:  SR, no arrhythmias  Respiratory:  CTA bilaterally, no w/r/r. Normal respiratory effort. Abdomen:  soft, nontender, distended but non-ascitic  Skin:  no rash or induration seen on limited exam Musculoskeletal:  grossly normal tone BUE/BLE Psychiatric:  grossly normal mood and affect, speech fluent and appropriate Neurologic:  Cranial nerves II through XII grossly intact, moves all charities and coronary fashion, no asterixis. Intermittent resting tremor.           Labs on Admission:  Basic Metabolic Panel:  Recent Labs Lab 07/07/14 1243  NA 136  K 3.8  CL 98*  CO2 28  GLUCOSE 159*  BUN <5*  CREATININE 0.70  CALCIUM 8.3*   Liver Function Tests:  Recent Labs Lab 07/07/14 1243  AST 99*  ALT 40  ALKPHOS 73  BILITOT 1.2  PROT 7.2  ALBUMIN 3.1*   No results for input(s): LIPASE, AMYLASE in the last 168 hours.  Recent Labs Lab 07/07/14 1351  AMMONIA 43*   CBC:  Recent Labs Lab 07/07/14 1243  WBC 6.1  NEUTROABS 4.4  HGB 11.5*  HCT 35.0*  MCV 112.5*  PLT 122*   Cardiac Enzymes: No results for input(s): CKTOTAL, CKMB, CKMBINDEX, TROPONINI in the last 168 hours.  BNP (last 3 results) No results for input(s): BNP in the last 8760 hours.  ProBNP (last 3 results) No results for input(s): PROBNP in the last 8760 hours.  CBG: No results for input(s): GLUCAP in the last 168 hours.  Radiological Exams on Admission: Dg Chest 1 View  07/07/2014   CLINICAL DATA:  Multiple  falls today.  Initial encounter.  EXAM: CHEST  1 VIEW  COMPARISON:  PA and lateral chest 03/16/2012 and CT chest 02/07/2011.  FINDINGS: The lungs are emphysematous but appear clear. Heart size is normal. No pneumothorax or pleural effusion is identified. No focal bony abnormality is seen.  IMPRESSION: Emphysema without acute disease.   Electronically Signed   By: Drusilla Kannerhomas  Dalessio M.D.   On: 07/07/2014 14:39   Ct Head Wo Contrast  07/07/2014   CLINICAL DATA:  59 year old male with diarrhea and frequent falls. Initial encounter.  EXAM: CT HEAD WITHOUT CONTRAST  TECHNIQUE: Contiguous axial images were obtained from the base of the skull through the vertex without intravenous contrast.  COMPARISON:  None.  FINDINGS: Visualized paranasal sinuses and mastoids are clear.  Visualized orbits and scalp soft tissues are within normal limits. No acute osseous abnormality identified.  Calcified atherosclerosis at the skull base. Cerebral volume is within normal limits for age. No suspicious intracranial vascular hyperdensity. No midline shift, ventriculomegaly, mass effect, evidence of mass lesion, intracranial hemorrhage or evidence of cortically based acute infarction. Gray-white matter differentiation is within normal limits throughout the brain.  IMPRESSION: Normal for age non contrast CT appearance of the brain.   Electronically Signed   By: Odessa FlemingH  Hall M.D.   On: 07/07/2014 14:34    EKG: Independently reviewed. Sinus, no sign of ACS  Assessment/Plan Principal Problem:   Generalized weakness Active Problems:   Thrombocytopenia   Chronic edema of lower extremity   Personal history of DVT (deep vein thrombosis)   ETOH abuse   Macrocytic anemia   Left radial fracture   Anxiety   Generalized Weakness: Likely multifactorial including malnutrition from recent GI illness, probable cirrhosis from alcohol abuse, physical deconditioning from decreased physical activity secondary to COPD. AST 99, ALT 40, albumin 3.1,  ammonia 43, PLT 122. - med surge - PT/OT - Hepatitis panel - Outpatient follow-up with GI - HIV - IVF - Cdiff - zofran - Clear liquid diet, ADAT  EtOH abuse: Baseline of 4 beers per day. Patient states that he doesn't feel like his drinking is out-of-control. Reviewed labs with patient and discussed the likelihood of chronic hepatic injury from alcohol use. - CIWA - Folate, Thiamine, IVF  Left radial fracture: Sustained injury on 06/23/2014 after fall. Being followed and managed by orthopedics outpatient. - Continue with current left arm cast and follow-up with or throat outpatient  COPD: At baseline. No home O2. Patient states that his COPD is the reason for his disability though condition does not seem to be terribly advanced. - Continue Combivent PRN - O2 PRN  Anemia and Thrombocytopenia: Hgb 11.5 Macrocytic. PLT 122. Likely secondary to EtOH use and cirrhosis. - Anemia panel  Anxiety: - Continue Xanax when necessary  Code Status: FULL  DVT Prophylaxis: Hep Family Communication: None Disposition Plan: Pending improvemetn   Karinna Beadles Shela CommonsJ, MD Family Medicine [facility] [email or web address] Password [email or web address]

## 2014-07-07 NOTE — ED Notes (Signed)
No episodes of diarrhea today, states hes "holding it",. Last BM last night.

## 2014-07-07 NOTE — ED Provider Notes (Signed)
CSN: [id number]     Arrival date & time 07/07/14  1119 History   First MD Initiated Contact with Patient 07/07/14 1248     Chief Complaint  Patient presents with  . Diarrhea      Patient is a 59 y.o. male presenting with diarrhea. The history is provided by the patient. No language interpreter was used.  Diarrhea  Wesley Ho presents for evaluation of dizziness.  He reports intermittent dizziness for the last few months with frequent falls.  He fell about 2 weeks ago and injured his hand at that time.  Today he felt too dizzy/off balance to even stand or walk. He reports nausea, decreased appetite recently.  He has chronic lower extremity edema - unchanged from baseline.  He reports one week of diarrhea, described as diffuse (more the 5 episodes daily), nonbloody, nonmelanotic.  He has a hx/o recurrent intermittent diarrhea for the last year.  Sxs are moderate, constant, worsening.  He is a regular drinker and consumes 4-5 beers nightly.  He denies any hx/o EtOH withdrawal.    Past Medical History  Diagnosis Date  . DVT (deep venous thrombosis) 09/28/2010  . H/O Thrombocytopenia 09/28/2010  . Chronic edema of lower extremity 09/28/2010  . Multiple lung nodules 09/28/2010  . Fatigue   . Emphysema of lung   . Bronchitis    Past Surgical History  Procedure Laterality Date  . Cholecystectomy    . Mouth surgery     Family History  Problem Relation Age of Onset  . Diabetes Mother   . Clotting disorder Father    History  Substance Use Topics  . Smoking status: Former Smoker -- 1.00 packs/day for 40 years  . Smokeless tobacco: Not on file  . Alcohol Use: 14.4 oz/week    24 Cans of beer per week     Comment: daily- 2 cans of beer    Review of Systems  Gastrointestinal: Positive for diarrhea.  All other systems reviewed and are negative.     Allergies  Review of patient's allergies indicates no known allergies.  Home Medications   Prior to Admission medications   Medication  Sig Start Date End Date Taking? Authorizing Provider  albuterol-ipratropium (COMBIVENT) 18-103 MCG/ACT inhaler Inhale 2 puffs into the lungs every 4 (four) hours as needed for wheezing or shortness of breath. Patient not taking: Reported on 06/23/2014 03/18/12   Erick Blinks, MD  alprazolam Prudy Feeler) 2 MG tablet Take 2 mg by mouth every 6 (six) hours as needed for anxiety.     Historical Provider, MD  benzonatate (TESSALON) 200 MG capsule Take 1 capsule (200 mg total) by mouth 3 (three) times daily as needed for cough. Patient not taking: Reported on 06/23/2014 03/18/12   Erick Blinks, MD  levofloxacin (LEVAQUIN) 500 MG tablet Take 1 tablet (500 mg total) by mouth daily. Patient not taking: Reported on 06/23/2014 03/18/12   Erick Blinks, MD  lisinopril (PRINIVIL,ZESTRIL) 5 MG tablet Take 5 mg by mouth daily. 06/20/14   Historical Provider, MD  oxyCODONE-acetaminophen (PERCOCET) 5-325 MG per tablet Take 1-2 tablets by mouth every 4 (four) hours as needed. 06/23/14   Donnetta Hutching, MD  predniSONE (DELTASONE) 10 MG tablet Take  po daily for 2 days then  po daily for 2 days then  po daily for 2 days, then  po daily for 2 days then stop Patient not taking: Reported on 06/23/2014 03/18/12   Erick Blinks, MD   BP 141/67 mmHg  Pulse 107  Temp(Src)  98.3 F (36.8 C) (Oral)  Resp 16  Ht 5\' 9"  (1.753 m)  Wt 230 lb (104.327 kg)  BMI 33.95 kg/m2 Physical Exam  Constitutional: He is oriented to person, place, and time. He appears well-developed and well-nourished.  HENT:  Head: Normocephalic and atraumatic.  Seborrhea of face  Cardiovascular: Normal rate and regular rhythm.   No murmur heard. Pulmonary/Chest: Effort normal and breath sounds normal. No respiratory distress.  Abdominal: Soft. There is no tenderness. There is no rebound and no guarding.  Musculoskeletal: He exhibits no tenderness.  Splint on left wrist with mild diffuse swelling of digits.  2+ pitting edema of BLE.  Palmar  erythema.    Neurological: He is alert and oriented to person, place, and time. No cranial nerve deficit.  Generalized weakness.  Resting tremor of RUE.  No asterixis.    Skin: Skin is warm and dry.  Psychiatric: He has a normal mood and affect. His behavior is normal.  Nursing note and vitals reviewed.   ED Course  Procedures (including critical care time) Labs Review Labs Reviewed  CBC WITH DIFFERENTIAL/PLATELET - Abnormal; Notable for the following:    RBC 3.11 (*)    Hemoglobin 11.5 (*)    HCT 35.0 (*)    MCV 112.5 (*)    MCH 37.0 (*)    Platelets 122 (*)    All other components within normal limits  COMPREHENSIVE METABOLIC PANEL - Abnormal; Notable for the following:    Chloride 98 (*)    Glucose, Bld 159 (*)    BUN <5 (*)    Calcium 8.3 (*)    Albumin 3.1 (*)    AST 99 (*)    All other components within normal limits  PROTIME-INR - Abnormal; Notable for the following:    Prothrombin Time 16.4 (*)    All other components within normal limits  AMMONIA - Abnormal; Notable for the following:    Ammonia 43 (*)    All other components within normal limits  URINALYSIS, ROUTINE W REFLEX MICROSCOPIC    Imaging Review Dg Chest 1 View  07/07/2014   CLINICAL DATA:  Multiple falls today.  Initial encounter.  EXAM: CHEST  1 VIEW  COMPARISON:  PA and lateral chest 03/16/2012 and CT chest 02/07/2011.  FINDINGS: The lungs are emphysematous but appear clear. Heart size is normal. No pneumothorax or pleural effusion is identified. No focal bony abnormality is seen.  IMPRESSION: Emphysema without acute disease.   Electronically Signed   By: Drusilla Kannerhomas  Dalessio M.D.   On: 07/07/2014 14:39   Ct Head Wo Contrast  07/07/2014   CLINICAL DATA:  59 year old male with diarrhea and frequent falls. Initial encounter.  EXAM: CT HEAD WITHOUT CONTRAST  TECHNIQUE: Contiguous axial images were obtained from the base of the skull through the vertex without intravenous contrast.  COMPARISON:  None.  FINDINGS:  Visualized paranasal sinuses and mastoids are clear. Visualized orbits and scalp soft tissues are within normal limits. No acute osseous abnormality identified.  Calcified atherosclerosis at the skull base. Cerebral volume is within normal limits for age. No suspicious intracranial vascular hyperdensity. No midline shift, ventriculomegaly, mass effect, evidence of mass lesion, intracranial hemorrhage or evidence of cortically based acute infarction. Gray-white matter differentiation is within normal limits throughout the brain.  IMPRESSION: Normal for age non contrast CT appearance of the brain.   Electronically Signed   By: Odessa FlemingH  Hall M.D.   On: 07/07/2014 14:34     EKG Interpretation   Date/Time:  Wednesday Jul 07 2014 13:30:52 EDT Ventricular Rate:  99 PR Interval:  143 QRS Duration: 92 QT Interval:  360 QTC Calculation: 462 R Axis:   72 Text Interpretation:  Sinus rhythm Baseline wander in lead(s) V3 V4  Confirmed by Wesley Ho, Wesley [phone]([id number]) on 07/07/2014 1:32:45 PM      MDM   Final diagnoses:  Weakness  Dizziness  Gait instability    Patient here for evaluation of dizziness and gait instability. Patient does have a history of EtOH abuse, frequent falls and lives alone. Patient does have a nonfocal neurologic examination but he does have a tremor at rest. CBC demonstrates a macrocytic anemia. Suspect some degree of vitamin deficiency that may be concerning symptoms giving his ongoing EtOH abuse. Patient is unable to stand in the emergency department. Discussed with hospitalist regarding observation admission.    Wesley FossaElizabeth Keashia Haskins, MD 07/07/14 1626

## 2014-07-08 ENCOUNTER — Observation Stay (HOSPITAL_COMMUNITY): Payer: Medicare Other

## 2014-07-08 ENCOUNTER — Encounter (HOSPITAL_COMMUNITY): Payer: Self-pay | Admitting: Internal Medicine

## 2014-07-08 DIAGNOSIS — J449 Chronic obstructive pulmonary disease, unspecified: Secondary | ICD-10-CM | POA: Diagnosis present

## 2014-07-08 DIAGNOSIS — R600 Localized edema: Secondary | ICD-10-CM | POA: Diagnosis not present

## 2014-07-08 DIAGNOSIS — R531 Weakness: Secondary | ICD-10-CM | POA: Diagnosis not present

## 2014-07-08 DIAGNOSIS — R197 Diarrhea, unspecified: Secondary | ICD-10-CM | POA: Diagnosis not present

## 2014-07-08 DIAGNOSIS — R739 Hyperglycemia, unspecified: Secondary | ICD-10-CM | POA: Diagnosis present

## 2014-07-08 LAB — HEPATITIS PANEL, ACUTE
HCV Ab: NEGATIVE
Hep A IgM: NONREACTIVE
Hep B C IgM: NONREACTIVE
Hepatitis B Surface Ag: NEGATIVE

## 2014-07-08 LAB — COMPREHENSIVE METABOLIC PANEL WITH GFR
ALT: 33 U/L (ref 17–63)
AST: 72 U/L — ABNORMAL HIGH (ref 15–41)
Albumin: 2.8 g/dL — ABNORMAL LOW (ref 3.5–5.0)
Alkaline Phosphatase: 63 U/L (ref 38–126)
Anion gap: 8 (ref 5–15)
BUN: <5 mg/dL — ABNORMAL LOW (ref 6–20)
CO2: 30 mmol/L (ref 22–32)
Calcium: 8 mg/dL — ABNORMAL LOW (ref 8.9–10.3)
Chloride: 101 mmol/L (ref 101–111)
Creatinine, Ser: 0.72 mg/dL (ref 0.61–1.24)
GFR calc Af Amer: >60 mL/min
GFR calc non Af Amer: >60 mL/min
Glucose, Bld: 156 mg/dL — ABNORMAL HIGH (ref 70–99)
Potassium: 3.2 mmol/L — ABNORMAL LOW (ref 3.5–5.1)
Sodium: 139 mmol/L (ref 135–145)
Total Bilirubin: 1.5 mg/dL — ABNORMAL HIGH (ref 0.3–1.2)
Total Protein: 6.7 g/dL (ref 6.5–8.1)

## 2014-07-08 LAB — URINALYSIS, ROUTINE W REFLEX MICROSCOPIC
GLUCOSE, UA: NEGATIVE mg/dL
HGB URINE DIPSTICK: NEGATIVE
LEUKOCYTES UA: NEGATIVE
Nitrite: POSITIVE — AB
Specific Gravity, Urine: 1.025 (ref 1.005–1.030)
Urobilinogen, UA: 0.2 mg/dL (ref 0.0–1.0)
pH: 6 (ref 5.0–8.0)

## 2014-07-08 LAB — URINE MICROSCOPIC-ADD ON

## 2014-07-08 LAB — GLUCOSE, CAPILLARY
Glucose-Capillary: 167 mg/dL — ABNORMAL HIGH (ref 70–99)
Glucose-Capillary: 110 mg/dL — ABNORMAL HIGH (ref 70–99)

## 2014-07-08 LAB — CBC
HCT: 33.4 % — ABNORMAL LOW (ref 39.0–52.0)
Hemoglobin: 11 g/dL — ABNORMAL LOW (ref 13.0–17.0)
MCH: 37.9 pg — ABNORMAL HIGH (ref 26.0–34.0)
MCHC: 32.9 g/dL (ref 30.0–36.0)
MCV: 115.2 fL — ABNORMAL HIGH (ref 78.0–100.0)
Platelets: 112 K/uL — ABNORMAL LOW (ref 150–400)
RBC: 2.9 MIL/uL — ABNORMAL LOW (ref 4.22–5.81)
RDW: 15.9 % — ABNORMAL HIGH (ref 11.5–15.5)
WBC: 4.5 K/uL (ref 4.0–10.5)

## 2014-07-08 LAB — CLOSTRIDIUM DIFFICILE BY PCR: Toxigenic C. Difficile by PCR: NEGATIVE

## 2014-07-08 LAB — TSH: TSH: 2.28 u[IU]/mL (ref 0.350–4.500)

## 2014-07-08 LAB — VITAMIN B12: Vitamin B-12: 227 pg/mL (ref 180–914)

## 2014-07-08 MED ORDER — INSULIN ASPART 100 UNIT/ML ~~LOC~~ SOLN
0.0000 [IU] | Freq: Three times a day (TID) | SUBCUTANEOUS | Status: DC
  Administered 2014-07-08: 2 [IU] via SUBCUTANEOUS
  Administered 2014-07-09: 1 [IU] via SUBCUTANEOUS
  Administered 2014-07-09: 2 [IU] via SUBCUTANEOUS

## 2014-07-08 MED ORDER — OXYCODONE HCL 5 MG PO TABS
5.0000 mg | ORAL_TABLET | Freq: Four times a day (QID) | ORAL | Status: DC | PRN
  Administered 2014-07-08 – 2014-07-09 (×4): 5 mg via ORAL
  Filled 2014-07-08 (×4): qty 1

## 2014-07-08 MED ORDER — INSULIN ASPART 100 UNIT/ML ~~LOC~~ SOLN: 0.0000 [IU] | Freq: Every day | SUBCUTANEOUS | Status: DC

## 2014-07-08 MED ORDER — ALPRAZOLAM 1 MG PO TABS
1.0000 mg | ORAL_TABLET | Freq: Four times a day (QID) | ORAL | Status: DC | PRN
Start: 2014-07-08 — End: 2014-07-09
  Administered 2014-07-08 – 2014-07-09 (×4): 1 mg via ORAL
  Filled 2014-07-08 (×4): qty 1

## 2014-07-08 MED ORDER — POTASSIUM CHLORIDE CRYS ER 20 MEQ PO TBCR
40.0000 meq | EXTENDED_RELEASE_TABLET | ORAL | Status: AC
  Administered 2014-07-08 (×2): 40 meq via ORAL
  Filled 2014-07-08 (×2): qty 2

## 2014-07-08 NOTE — Evaluation (Signed)
Physical Therapy Evaluation Patient Details Name: Wesley Ho MRN: [id number] DOB: 06-Jun-1955 Today's Date: 07/08/2014   History of Present Illness  Pt is a 59 y/o male progressive and constant generalized weakness. Pt reports multiple falls at home. Seen a few weeks ago in ED after falling and sustaining a Ho arm fracture. Lives at home by self. Poor sleep at night. Denies unintentional weight loss, fevers, night sweats, chest pain, shortness of breath, palpitations, focal weakness.Diarrhea for past 1 week. Approximately 3-6 episodes daily. Chronic ongoing issue for pt. Episodes of diarrhea typically last 2-3 days. Associated w/ "dry heaving." 2-4 cans of beer daily. LE swelling at baseline. No home O2. Disabled from COPD.  Clinical Impression   Pt was seen for the initiation of evaluation.  He reports severe weakness and now is unable to stand or walk.  He acknowledges being very sedentary at home "because I can be".  He is on disability but cannot remember the reason for this.  He normally ambulates with no assistive device but has recently had falls at home.  He states that his legs just won't hold him up.  Muscle test reveals strength to be  4-/5 but I was not able to test endurance.  He needed to have a BM.  I waited on him to complete this but after a while I could no longer wait due to a very heavy patient load.  I will try to return later today to assess mobility.  He will probably need to use a left platform walker.    Follow Up Recommendations  (to be determined)    Equipment Recommendations   (to be determined)    Recommendations for Other Services       Precautions / Restrictions Precautions Precautions: Fall Restrictions Weight Bearing Restrictions: No      Mobility  Bed Mobility               General bed mobility comments: not yet assessed...pt realized that he needed to have a BM and wanted the bedpan prior to getting up...  Transfers                     Ambulation/Gait                Stairs            Wheelchair Mobility    Modified Rankin (Stroke Patients Only)       Balance                                             Pertinent Vitals/Pain Pain Assessment: No/denies pain Pain Score: 5  Pain Location: left arm Pain Descriptors / Indicators: Aching Pain Intervention(s): Limited activity within patient's tolerance;Monitored during session    Home Living Family/patient expects to be discharged to:: Private residence Living Arrangements: Alone Available Help at Discharge: Family;Neighbor Type of Home: House       Home Layout: One level Home Equipment: None      Prior Function Level of Independence: Needs assistance      ADL's / Homemaking Assistance Needed: Pt reports his neighbor comes and helps him with some B/IADL tasks        Hand Dominance   Dominant Hand: Right    Extremity/Trunk Assessment   Upper Extremity Assessment: Defer to OT evaluation       LUE  Deficits / Details: left arm is casted/splinted from fx/break   Lower Extremity Assessment: Overall WFL for tasks assessed (pt describes weakness but is able to take resistance in all muscle groups--strength generally 4-/5)      Cervical / Trunk Assessment: Normal  Communication   Communication: No difficulties  Cognition Arousal/Alertness: Awake/alert Behavior During Therapy: WFL for tasks assessed/performed Overall Cognitive Status: Within Functional Limits for tasks assessed       Memory: Decreased short-term memory (per pt report)              General Comments      Exercises        Assessment/Plan    PT Assessment    PT Diagnosis Difficulty walking   PT Problem List    PT Treatment Interventions     PT Goals (Current goals can be found in the Care Plan section) Acute Rehab PT Goals Patient Stated Goal: To go home    Frequency     Barriers to discharge        Co-evaluation                End of Session   Activity Tolerance: Patient tolerated treatment well Patient left: in bed Nurse Communication: Mobility status         Time: 1030-1100 PT Time Calculation (min) (ACUTE ONLY): 30 min   Charges:   PT Evaluation $Initial PT Evaluation Tier I: 1 Procedure     PT G CodesMyrlene Ho:        Wesley Ho 07/08/2014, 12:26 PM

## 2014-07-08 NOTE — Progress Notes (Signed)
Physical Therapy Treatment Patient Details Name: Chuck Hintdward W Kuhlmann MRN: [id number] DOB: 1955/05/03 Today's Date: 07/08/2014    History of Present Illness Pt is a 59 y/o male progressive and constant generalized weakness. Pt reports multiple falls at home. Seen a few weeks ago in ED after falling and sustaining a L arm fracture. Lives at home by self. Poor sleep at night. Denies unintentional weight loss, fevers, night sweats, chest pain, shortness of breath, palpitations, focal weakness.Diarrhea for past 1 week. Approximately 3-6 episodes daily. Chronic ongoing issue for pt. Episodes of diarrhea typically last 2-3 days. Associated w/ "dry heaving." 2-4 cans of beer daily. LE swelling at baseline. No home O2. Disabled from COPD.    PT Comments    Pt was seen for evaluation of mobility, gait training.  He voiced no c/o this afternoon.   He was instructed in transfer OOB.  He was able to sit independently and able to come to standing with guarding.  Orthostatic vitals were taken and recorded in flowsheets.  He did not demonstrate any orthostatic hypotension and had no symptoms of such.  He was instructed in gait with a left platform walker and was able to ambulate 25' with no instability.  He is very afraid of falling and needed a lot of encouragement.  We discussed his situation and our concern about him being able to manage at home.  He now is in agreement to SN[facility]  Follow Up Recommendations  SNF (pt is now agreeable)     Equipment Recommendations  Rolling walker with 5" wheels (with left platform[facility]    Recommendations for Other Services  OT     Precautions / Restrictions Precautions Precautions: Fall Restrictions Weight Bearing Restrictions: No    Mobility  Bed Mobility Overal bed mobility: Modified Independent             General bed mobility comments: HOB flat  Transfers Overall transfer level: Needs assistance Equipment used: Left  platform walker Transfers: Sit to/from Stand Sit to Stand: Min guard         General transfer comment: instruction for hand placement  Ambulation/Gait Ambulation/Gait assistance: Min guard Ambulation Distance (Feet): 26 Feet Assistive device: Left platform walker Gait Pattern/deviations: WFL(Within Functional Limits)   Gait velocity interpretation: Below normal speed for age/gender General Gait Details: gait with walker is stable                       Balance Overall balance assessment: Needs assistance Sitting-balance support: No upper extremity supported;Feet supported Sitting balance-Leahy Scale: Good     Standing balance support: No upper extremity supported Standing balance-Leahy Scale: Fair Standing balance comment: pt very afraid of falling                    Cognition Arousal/Alertness: Awake/alert Behavior During Therapy: WFL for tasks assessed/performed Overall Cognitive Status: Within Functional Limits for tasks assessed                                    Pertinent Vitals/Pain Pain Assessment: No/denies pain                                      PT Goals (current goals can now be found in the care plan section) Progress  towards PT goals: Progressing toward goals    Frequency  Min 3X/week    PT Plan Current plan remains appropriate (plan to see pt min of 3x/week)                 End of Session Equipment Utilized During Treatment: Gait belt Activity Tolerance: Patient tolerated treatment well Patient left: in bed;with call bell/phone within reach;with nursing/sitter in room     Time: [id number] PT Time Calculation (min) (ACUTE ONLY): 29 min  Charges:  $Gait Training: 8-22 mins                    G Codes:      Konrad PentaBrown, Elissia Spiewak L 07/08/2014, 4:47 PM

## 2014-07-08 NOTE — Evaluation (Signed)
Occupational Therapy Evaluation Patient Details Name: Wesley Ho MRN: [id number] DOB: 1955-05-15 Today's Date: 07/08/2014    History of Present Illness Pt is a 59 y/o male progressive and constant generalized weakness. Pt reports multiple falls at home. Seen a few weeks ago in ED after falling and sustaining a L arm fracture. Lives at home by self. Poor sleep at night. Denies unintentional weight loss, fevers, night sweats, chest pain, shortness of breath, palpitations, focal weakness.Diarrhea for past 1 week. Approximately 3-6 episodes daily. Chronic ongoing issue for pt. Episodes of diarrhea typically last 2-3 days. Associated w/ "dry heaving." 2-4 cans of beer daily. LE swelling at baseline. No home O2. Disabled from COPD.   Clinical Impression   PTA pt lived at home alone and received assistance from neighbor "Boyd Kerbsenny" with dressing and housekeeping tasks. Pt reports he stays on the couch most of the time and doesn't move around much due to feeling dizzy, lightheaded, and falling frequently. Pt is unable to take a shower because he can't get to the bathroom, however is able to get to the bathroom most of the time for toileting needs. Pt is deconditioned and is not safe during functional mobility tasks at home. Strongly recommend SNF on discharge, however [facility]. Educated pt on benefits of SNF and 24 hr supervision provided at this type of rehabilitation facility. Pt reports his son can help him, however pt does not have his son's phone number. Pt will benefit from continued OT services to increase independence in B/IADL tasks and functional mobility tasks.     Follow Up Recommendations  SNF;Supervision/Assistance - 24 hour    Equipment Recommendations  3 in 1 bedside comode;Tub/shower seat       Precautions / Restrictions Precautions Precautions: Fall Restrictions Weight Bearing Restrictions: No              ADL Overall ADL's : Needs assistance/impaired                                        General ADL Comments: Pt requires assistance during lower body dressing, and reports that he is unable to shower but sponge bathes. Pt has neigbor that does housekeeping.      Vision Vision Assessment?: Yes Eye Alignment: Within Functional Limits Ocular Range of Motion: Within Functional Limits Alignment/Gaze Preference: Within Defined Limits Tracking/Visual Pursuits: Able to track stimulus in all quads without difficulty Saccades: Within functional limits Convergence: Within functional limits Visual Fields: No apparent deficits          Pertinent Vitals/Pain Pain Assessment: 0-10 Pain Score: 5  Pain Location: left arm Pain Descriptors / Indicators: Aching Pain Intervention(s): Limited activity within patient's tolerance;Monitored during session     Hand Dominance Right   Extremity/Trunk Assessment Upper Extremity Assessment Upper Extremity Assessment: LUE deficits/detail;Generalized weakness LUE Deficits / Details: left arm is casted/splinted from fx/break LUE: Unable to fully assess due to immobilization   Lower Extremity Assessment Lower Extremity Assessment: Defer to PT evaluation       Communication Communication Communication: No difficulties   Cognition Arousal/Alertness: Awake/alert Behavior During Therapy: WFL for tasks assessed/performed Overall Cognitive Status: Within Functional Limits for tasks assessed       Memory: Decreased short-term memory (per pt report)                        Home Living Family/patient  expects to be discharged to:: Private residence Living Arrangements: Alone Available Help at Discharge: Family;Neighbor Type of Home: House             Bathroom Shower/Tub: Walk-in Soil scientistshower   Bathroom Toilet: Standard     Home Equipment: None          Prior Functioning/Environment Level of Independence: Needs assistance    ADL's / Homemaking Assistance Needed: Pt reports  his neighbor comes and helps him with some B/IADL tasks        OT Diagnosis: Generalized weakness;Acute pain   OT Problem List: Decreased strength;Decreased activity tolerance;Impaired balance (sitting and/or standing);Decreased knowledge of use of DME or AE;Cardiopulmonary status limiting activity;Obesity;Impaired UE functional use;Pain   OT Treatment/Interventions: Self-care/ADL training;Therapeutic exercise;Energy conservation;Therapeutic activities;Patient/family education    OT Goals(Current goals can be found in the care plan section) Acute Rehab OT Goals Patient Stated Goal: To go home OT Goal Formulation: With patient Time For Goal Achievement: 07/22/14 Potential to Achieve Goals: Good  OT Frequency: Min 2X/week    End of Session    Activity Tolerance: Patient tolerated treatment well Patient left: in bed;with call bell/phone within reach;with bed alarm set   Time: 0820-0859 OT Time Calculation (min): 39 min Charges:  OT General Charges $OT Visit: 1 Procedure OT Evaluation $Initial OT Evaluation Tier I: 1 Procedure G-Codes: OT G-codes **NOT FOR INPATIENT CLASS** Functional Assessment Tool Used: Clinical judgement Functional Limitation: Self care Self Care Current Status (U7253(G8987): At least 60 percent but less than 80 percent impaired, limited or restricted Self Care Goal Status (G6440(G8988): At least 20 percent but less than 40 percent impaired, limited or restricted   Ezra SitesLeslie Troxler, OTR/L  [phone]  07/08/2014, 9:59 AM

## 2014-07-08 NOTE — Progress Notes (Signed)
[facility] PROGRESS NOTE  Wesley Ho ZOX:[id number] DOB: 03/14/1955 DOA: 07/07/2014 PCP: Wesley SmilesFUSCO,Wesley J., MD  Assessment/Plan: Generalized Weakness/ frequent fall: Likely multifactorial including malnutrition from recent GI illness and reported chronic diarrhea, physical deconditioning from decreased physical activity secondary to COPD, chronic IDA and polypharmacy. Stool negative cdiff, hepatitis panel pending, B12 within limits of normal, tsh 2.2, anemia panel with feritin 581, TIBC 230, retic 2.92 otherwise unremarkable. Will continue IV fluids and reduce narcotic dose. Advance diet. Unable to participate with PT today due to weakness, OT recommending snf.   EtOH abuse: reports 6 beers daily. Concern for chronic hepatic injury from alcohol use. am[place] level slightly elevated. ast and total bili somewhat elevated. Hep panel pending. Continue  CIWA. No sign withdrawals.   Left radial fracture: Sustained injury on 06/23/2014 after fall. Being followed and managed by orthopedics outpatient. - Continue with current left arm cast and follow-up with ortho outpatient  COPD: appears at  baseline. No home O2. Patient states that his COPD is the reason for his disability though condition does not seem to be terribly advanced. Denies using inhalers as they "make me jittery". sats 92% on room air.  Continue home O2 PRN  Anemia and Thrombocytopenia: Hgb 11.5 Macrocytic. PLT 122. Likely secondary to EtOH use and cirrhosis. Anemia panel as above.  Anxiety:appears stable at baseline.  Xanax when necessary at lower dose  Hyperglycemia: no hx diabetes. Will check A1c. Use SSI as needed. Chart review indicated 2 year hx hyperglycemia  Code Status: full Family Communication: none present Disposition Plan: home when ready   Consultants:  none  Procedures:  none  Antibiotics:  none  HPI/Subjective: Awake slightly lethargic denies pain  Objective: Filed Vitals:   07/08/14 0656  BP:  141/61  Pulse: 87  Temp: 98 F (36.7 C)  Resp: 20    Intake/Output Summary (Last 24 hours) at 07/08/14 1250 Last data filed at 07/08/14 1113  Gross per 24 hour  Intake    650 ml  Output    825 ml  Net   -175 ml   Filed Weights   07/07/14 1143 07/07/14 1828  Weight: 104.327 kg (230 lb) 110.133 kg (242 lb 12.8 oz)    Exam:   General:  Obese appears comfortable  Cardiovascular: RRR No MGR 1+ LE edema bilaterally   Respiratory: normal effort BS slightly distant but clear  Abdomen: obese soft +BS non-tender to palpation  Musculoskeletal: no clubbing or cyanosis   Data Reviewed: Basic Metabolic Panel:  Recent Labs Lab 07/07/14 1243 07/08/14 0709  NA 136 139  K 3.8 3.2*  CL 98* 101  CO2 28 30  GLUCOSE 159* 156*  BUN <5* <5*  CREATININE 0.70 0.72  CALCIUM 8.3* 8.0*   Liver Function Tests:  Recent Labs Lab 07/07/14 1243 07/08/14 0709  AST 99* 72*  ALT 40 33  ALKPHOS 73 63  BILITOT 1.2 1.5*  PROT 7.2 6.7  ALBUMIN 3.1* 2.8*   No results for input(s): LIPASE, AMYLASE in the last 168 hours.  Recent Labs Lab 07/07/14 1351  AMMONIA 43*   CBC:  Recent Labs Lab 07/07/14 1243 07/08/14 0709  WBC 6.1 4.5  NEUTROABS 4.4  --   HGB 11.5* 11.0*  HCT 35.0* 33.4*  MCV 112.5* 115.2*  PLT 122* 112*   Cardiac Enzymes: No results for input(s): CKTOTAL, CKMB, CKMBINDEX, TROPONINI in the last 168 hours. BNP (last 3 results) No results for input(s): BNP in the last 8760 hours.  ProBNP (last 3  results) No results for input(s): PROBNP in the last 8760 hours.  CBG: No results for input(s): GLUCAP in the last 168 hours.  Recent Results (from the past 240 hour(s))  Clostridium Difficile by PCR     Status: None   Collection Time: 07/07/14  9:45 PM  Result Value Ref Range Status   C difficile by pcr NEGATIVE NEGATIVE Final     Studies: Dg Chest 1 View  07/07/2014   CLINICAL DATA:  Multiple falls today.  Initial encounter.  EXAM: CHEST  1 VIEW  COMPARISON:   PA and lateral chest 03/16/2012 and CT chest 02/07/2011.  FINDINGS: The lungs are emphysematous but appear clear. Heart size is normal. No pneumothorax or pleural effusion is identified. No focal bony abnormality is seen.  IMPRESSION: Emphysema without acute disease.   Electronically Signed   By: Drusilla Kannerhomas  Dalessio M.D.   On: 07/07/2014 14:39   Ct Head Wo Contrast  07/07/2014   CLINICAL DATA:  59 year old male with diarrhea and frequent falls. Initial encounter.  EXAM: CT HEAD WITHOUT CONTRAST  TECHNIQUE: Contiguous axial images were obtained from the base of the skull through the vertex without intravenous contrast.  COMPARISON:  None.  FINDINGS: Visualized paranasal sinuses and mastoids are clear. Visualized orbits and scalp soft tissues are within normal limits. No acute osseous abnormality identified.  Calcified atherosclerosis at the skull base. Cerebral volume is within normal limits for age. No suspicious intracranial vascular hyperdensity. No midline shift, ventriculomegaly, mass effect, evidence of mass lesion, intracranial hemorrhage or evidence of cortically based acute infarction. Gray-white matter differentiation is within normal limits throughout the brain.  IMPRESSION: Normal for age non contrast CT appearance of the brain.   Electronically Signed   By: Odessa FlemingH  Hall M.D.   On: 07/07/2014 14:34    Scheduled Meds: . folic acid  1 mg Oral Daily  . heparin  5,000 Units Subcutaneous 3 times per day  . multivitamin with minerals  1 tablet Oral Daily  . potassium chloride  40 mEq Oral Q4H  . thiamine  100 mg Oral Daily   Or  . thiamine  100 mg Intravenous Daily   Continuous Infusions: . sodium chloride 125 mL/hr at 07/08/14 0703    Principal Problem:   Generalized weakness Active Problems:   Thrombocytopenia   Chronic edema of lower extremity   Personal history of DVT (deep vein thrombosis)   ETOH abuse   Macrocytic anemia   Left radial fracture   Anxiety   COPD (chronic obstructive  pulmonary disease)   Hyperglycemia    Time spent: 30 minutes    [facility] CenterBLACK,Wesley Gary M  [facility] Pager [phone]. If 7PM-7AM, please contact night-coverage at [email or web address], password [email or web address] 07/08/2014, 12:50 PM

## 2014-07-09 ENCOUNTER — Inpatient Hospital Stay
Admission: RE | Admit: 2014-07-09 | Discharge: 2014-07-28 | Disposition: A | Discharge status: Home / Self Care | Payer: Medicare Other | Source: Ambulatory Visit | Attending: Internal Medicine | Admitting: Internal Medicine

## 2014-07-09 DIAGNOSIS — R262 Difficulty in walking, not elsewhere classified: Secondary | ICD-10-CM | POA: Diagnosis not present

## 2014-07-09 DIAGNOSIS — Z87891 Personal history of nicotine dependence: Secondary | ICD-10-CM | POA: Diagnosis not present

## 2014-07-09 DIAGNOSIS — J441 Chronic obstructive pulmonary disease with (acute) exacerbation: Secondary | ICD-10-CM | POA: Diagnosis not present

## 2014-07-09 DIAGNOSIS — G609 Hereditary and idiopathic neuropathy, unspecified: Secondary | ICD-10-CM | POA: Diagnosis not present

## 2014-07-09 DIAGNOSIS — Z4789 Encounter for other orthopedic aftercare: Secondary | ICD-10-CM | POA: Diagnosis not present

## 2014-07-09 DIAGNOSIS — R600 Localized edema: Secondary | ICD-10-CM | POA: Diagnosis not present

## 2014-07-09 DIAGNOSIS — R531 Weakness: Secondary | ICD-10-CM | POA: Diagnosis not present

## 2014-07-09 DIAGNOSIS — R269 Unspecified abnormalities of gait and mobility: Secondary | ICD-10-CM | POA: Diagnosis not present

## 2014-07-09 DIAGNOSIS — R110 Nausea: Secondary | ICD-10-CM | POA: Diagnosis not present

## 2014-07-09 DIAGNOSIS — S52502A Unspecified fracture of the lower end of left radius, initial encounter for closed fracture: Secondary | ICD-10-CM | POA: Diagnosis not present

## 2014-07-09 DIAGNOSIS — J449 Chronic obstructive pulmonary disease, unspecified: Secondary | ICD-10-CM | POA: Diagnosis not present

## 2014-07-09 DIAGNOSIS — D649 Anemia, unspecified: Secondary | ICD-10-CM | POA: Diagnosis not present

## 2014-07-09 DIAGNOSIS — R296 Repeated falls: Secondary | ICD-10-CM | POA: Diagnosis not present

## 2014-07-09 DIAGNOSIS — S5292XD Unspecified fracture of left forearm, subsequent encounter for closed fracture with routine healing: Secondary | ICD-10-CM | POA: Diagnosis not present

## 2014-07-09 DIAGNOSIS — Z86718 Personal history of other venous thrombosis and embolism: Secondary | ICD-10-CM | POA: Diagnosis not present

## 2014-07-09 DIAGNOSIS — E1142 Type 2 diabetes mellitus with diabetic polyneuropathy: Secondary | ICD-10-CM | POA: Diagnosis not present

## 2014-07-09 DIAGNOSIS — F419 Anxiety disorder, unspecified: Secondary | ICD-10-CM | POA: Diagnosis not present

## 2014-07-09 DIAGNOSIS — D696 Thrombocytopenia, unspecified: Secondary | ICD-10-CM | POA: Diagnosis not present

## 2014-07-09 DIAGNOSIS — R42 Dizziness and giddiness: Secondary | ICD-10-CM | POA: Diagnosis not present

## 2014-07-09 DIAGNOSIS — D539 Nutritional anemia, unspecified: Secondary | ICD-10-CM | POA: Diagnosis not present

## 2014-07-09 DIAGNOSIS — Z720 Tobacco use: Secondary | ICD-10-CM | POA: Diagnosis not present

## 2014-07-09 DIAGNOSIS — J439 Emphysema, unspecified: Secondary | ICD-10-CM | POA: Diagnosis not present

## 2014-07-09 DIAGNOSIS — F1010 Alcohol abuse, uncomplicated: Secondary | ICD-10-CM | POA: Diagnosis not present

## 2014-07-09 DIAGNOSIS — Z79899 Other long term (current) drug therapy: Secondary | ICD-10-CM | POA: Diagnosis not present

## 2014-07-09 DIAGNOSIS — M6281 Muscle weakness (generalized): Secondary | ICD-10-CM | POA: Diagnosis not present

## 2014-07-09 DIAGNOSIS — Z9181 History of falling: Secondary | ICD-10-CM | POA: Diagnosis not present

## 2014-07-09 DIAGNOSIS — R488 Other symbolic dysfunctions: Secondary | ICD-10-CM | POA: Diagnosis not present

## 2014-07-09 DIAGNOSIS — R270 Ataxia, unspecified: Secondary | ICD-10-CM | POA: Diagnosis not present

## 2014-07-09 DIAGNOSIS — E46 Unspecified protein-calorie malnutrition: Secondary | ICD-10-CM | POA: Diagnosis not present

## 2014-07-09 DIAGNOSIS — Z87828 Personal history of other (healed) physical injury and trauma: Secondary | ICD-10-CM | POA: Diagnosis not present

## 2014-07-09 DIAGNOSIS — K529 Noninfective gastroenteritis and colitis, unspecified: Secondary | ICD-10-CM | POA: Diagnosis not present

## 2014-07-09 DIAGNOSIS — R278 Other lack of coordination: Secondary | ICD-10-CM | POA: Diagnosis not present

## 2014-07-09 DIAGNOSIS — Z792 Long term (current) use of antibiotics: Secondary | ICD-10-CM | POA: Diagnosis not present

## 2014-07-09 LAB — HEMOGLOBIN A1C
HEMOGLOBIN A1C: 6.1 % — AB (ref 4.8–5.6)
MEAN PLASMA GLUCOSE: 128 mg/dL

## 2014-07-09 LAB — CBC
HCT: 33.4 % — ABNORMAL LOW (ref 39.0–52.0)
HEMOGLOBIN: 10.9 g/dL — AB (ref 13.0–17.0)
MCH: 38.5 pg — AB (ref 26.0–34.0)
MCHC: 32.6 g/dL (ref 30.0–36.0)
MCV: 118 fL — ABNORMAL HIGH (ref 78.0–100.0)
PLATELETS: 118 10*3/uL — AB (ref 150–400)
RBC: 2.83 MIL/uL — AB (ref 4.22–5.81)
RDW: 16.1 % — ABNORMAL HIGH (ref 11.5–15.5)
WBC: 5.7 10*3/uL (ref 4.0–10.5)

## 2014-07-09 LAB — BASIC METABOLIC PANEL
Anion gap: 7 (ref 5–15)
CALCIUM: 7.7 mg/dL — AB (ref 8.9–10.3)
CHLORIDE: 102 mmol/L (ref 101–111)
CO2: 28 mmol/L (ref 22–32)
Creatinine, Ser: 0.73 mg/dL (ref 0.61–1.24)
GFR calc Af Amer: >60 mL/min (ref >60)
GFR calc non Af Amer: >60 mL/min (ref >60)
Glucose, Bld: 130 mg/dL — ABNORMAL HIGH (ref 70–99)
Potassium: 3.7 mmol/L (ref 3.5–5.1)
Sodium: 137 mmol/L (ref 135–145)

## 2014-07-09 LAB — GLUCOSE, CAPILLARY
GLUCOSE-CAPILLARY: 156 mg/dL — AB (ref 70–99)
Glucose-Capillary: 128 mg/dL — ABNORMAL HIGH (ref 70–99)

## 2014-07-09 LAB — HIV ANTIBODY (ROUTINE TESTING W REFLEX): HIV Screen 4th Generation wRfx: NONREACTIVE

## 2014-07-09 MED ORDER — FOLIC ACID 1 MG PO TABS: 1.0000 mg | ORAL_TABLET | Freq: Every day | ORAL | Status: DC

## 2014-07-09 MED ORDER — OXYCODONE HCL 5 MG PO TABS: 5.0000 mg | ORAL_TABLET | Freq: Four times a day (QID) | ORAL | Status: DC | PRN

## 2014-07-09 MED ORDER — ADULT MULTIVITAMIN W/MINERALS CH: 1.0000 | ORAL_TABLET | Freq: Every day | ORAL | Status: DC

## 2014-07-09 MED ORDER — ALPRAZOLAM 1 MG PO TABS: 1.0000 mg | ORAL_TABLET | Freq: Four times a day (QID) | ORAL | Status: DC | PRN

## 2014-07-09 MED ORDER — ALUM & MAG HYDROXIDE-SIMETH 200-200-20 MG/5ML PO SUSP: 30.0000 mL | Freq: Four times a day (QID) | ORAL | Status: DC | PRN

## 2014-07-09 NOTE — Clinical Social Work Placement (Signed)
   CLINICAL SOCIAL WORK PLACEMENT  NOTE  Date:  07/09/2014  Patient Details  Name: Wesley Ho MRN: [id number] Date of Birth: 1956/01/14  Clinical Social Work is seeking post-discharge placement for this patient at the Skilled  Nursing Facility level of care (*CSW will initial, date and re-position this form in  chart as items are completed):  Yes   Patient/family provided with [facility] Clinical Social Work Department's list of facilities offering this level of care within the geographic area requested by the patient (or if unable, by the patient's family).  Yes   Patient/family informed of their freedom to choose among providers that offer the needed level of care, that participate in Medicare, Medicaid or managed care program needed by the patient, have an available bed and are willing to accept the patient.  Yes   Patient/family informed of [facility]'s ownership interest in [facility] and [facility], as well as of the fact that they are under no obligation to receive care at these facilities.  PASRR submitted to EDS on 07/09/14     PASRR number received on 07/09/14     Existing PASRR number confirmed on       FL2 transmitted to all facilities in geographic area requested by pt/family on 07/09/14     FL2 transmitted to all facilities within larger geographic area on       Patient informed that his/her managed care company has contracts with or will negotiate with certain facilities, including the following:            Patient/family informed of bed offers received.  Patient chooses bed at       Physician recommends and patient chooses bed at      Patient to be transferred to   on  .  Patient to be transferred to facility by       Patient family notified on   of transfer.  Name of family member notified:        PHYSICIAN Please sign FL2     Additional Comment:    _______________________________________________ Liliana Clinealdwell, Jeannetta Cerutti Parks, LCSW 07/09/2014,  11:00 AM

## 2014-07-09 NOTE — Progress Notes (Signed)
[facility]  She verbalized understanding and was told to call me for any questions once the patient arrives.  The patient was transferred via stretcher with packet in stable condition.

## 2014-07-09 NOTE — Clinical Social Work Note (Signed)
Clinical Social Work Assessment  Patient Details  Name: Wesley Ho MRN: [id number] Date of Birth: 18-Nov-1955  Date of referral:  07/09/14               Reason for consult:  Discharge Planning, Facility Placement                Permission sought to share information with:  Family Supports, Chartered certified accountant granted to share information::  Yes, Verbal Permission Granted  Name::      (son and daughter)  Chief Strategy Officer::   (SNF list)  Relationship::     Contact Information:     Housing/Transportation Living arrangements for the past 2 months:  Single Family Home Source of Information:  Patient Patient Interpreter Needed:  None Criminal Activity/Legal Involvement Pertinent to Current Situation/Hospitalization:  No - Comment as needed Significant Relationships:  Adult Children Lives with:    Do you feel safe going back to the place where you live?  Yes Need for family participation in patient care:  No (Coment)  Care giving concerns:  PT recommending SNF at dc.   Social Worker assessment / plan:  CSW met with patient and introduced self and role. Patient sitting in bed; states, "I've had some falls at home". He reports having a "son who is local, a daughter in [place] and and ex in [place]". He appears to understand the SNF option and is agreeable- wants to stay in [place] if possible.  CSW advised him it will determine on availability and insurance.    Employment status:  Disabled (Comment on whether or not currently receiving Disability) Insurance information:  Managed Medicare PT Recommendations:  [place] / Referral to community resources:  [place]  Patient/Family's Response to care:  Patient is concerned about falling- "I'm afraid to go home with all the falling". CSW educated him   Patient/Family's Understanding of and Emotional Response to Diagnosis, Current Treatment, and Prognosis:  Patient understands and  agrees with SNF search and that he will likely be ready today. He is anxious to get rehabilitated and home but also is anxious about going home and falling. He is optimistic and appreciative of CSW assistance.  Emotional Assessment Appearance:  Appears older than stated age Attitude/Demeanor/Rapport:  Other (appropriate) Affect (typically observed):  Accepting, Calm, Appropriate Orientation:  Oriented to Situation, Oriented to  Time, Oriented to Place, Oriented to Self Alcohol / Substance use:  Alcohol Use Psych involvement (Current and /or in the community):     Discharge Needs  Concerns to be addressed:  Discharge Planning Concerns Readmission within the last 30 days:  No Current discharge risk:  Physical Impairment Barriers to Discharge:  Other (awaiting SNF offers)   Ludwig Clarks, LCSW 07/09/2014, 10:48 AM

## 2014-07-09 NOTE — Discharge Summary (Signed)
Physician Discharge Summary  Wesley Ho MVH:[id number] DOB: 09/15/1955 DOA: 07/07/2014  PCP: Cassell SmilesFUSCO,LAWRENCE J., MD  Admit date: 07/07/2014 Discharge date: 07/09/2014  Time spent: 40 minutes  Recommendations for Outpatient Follow-up:  1. Follow up with PCP 2 weeks for evaluation of polypharmacy, ETOH use and hyperglycemia. Recommend LFT 2. Being discharged to [facility]  Discharge Diagnoses:  Principal Problem:   Generalized weakness Active Problems:   Thrombocytopenia   Chronic edema of lower extremity   Personal history of DVT (deep vein thrombosis)   ETOH abuse   Macrocytic anemia   Left radial fracture   Anxiety   COPD (chronic obstructive pulmonary disease)   Hyperglycemia   Discharge Condition: stable  Diet recommendation: regular  Filed Weights   07/07/14 1143 07/07/14 1828  Weight: 104.327 kg (230 lb) 110.133 kg (242 lb 12.8 oz)    History of present illness:   Presented to ED on 07/07/14 with progressive and constant generalized weakness. Pt reported multiple falls at home. Seen a few weeks prior in ED after falling and sustaining a L arm fracture. Lives at home by self.  Poor sleep at night. Denied unintentional weight loss, fevers, night sweats, chest pain, shortness of breath, palpitations, focal weakness. Diarrhea for previous 1 week. Approximately 3-6 episodes daily. Chronic ongoing issue for pt. Episodes of diarrhea typically last 2-3 days. Associated w/ "dry heaving."  2-4 cans of beer daily.   LE swelling at baseline. No home O2  Disabled from COPD   Hospital Course:  Generalized Weakness/ frequent fall: Likely multifactorial including malnutrition from recent GI illness and reported chronic diarrhea, physical deconditioning from decreased physical activity secondary to COPD, chronic IDA and polypharmacy. No diarrhea during hospitalization.  Stool negative cdiff, hepatitis panel non-reactive, B12 within limits of normal, tsh 2.2, anemia panel  with feritin 581, TIBC 230, retic 2.92 otherwise unremarkable. He was provided with IV fluids and slight decrease in home benzos and narcotics. Evaluated by PT who recommend snf   EtOH abuse: reports 6 beers daily. Concern for chronic hepatic injury from alcohol use. amonia level only slightly elevated. ast and total bili somewhat elevated. Hepatitis panel non-reactive. Recommend OP follow up for trending of LFT.  No sign withdrawals.   Left radial fracture: Sustained injury on 06/23/2014 after fall. Being followed and managed by orthopedics outpatient. - Continue with current left arm cast and follow-up with ortho outpatient  COPD: stable at baseline during hospitalizatoin. No home O2. Patient stated that his COPD is the reason for his disability though condition does not seem to be terribly advanced. Denied using inhalers as they "make me jittery". sats 93% on room air.  Anemia and Thrombocytopenia: Hgb 11.5 Macrocytic. PLT 122. Likely secondary to EtOH use and cirrhosis. Anemia panel as above. Recommend OP tracking  Anxiety:remained stable at baseline. Xanax when necessary at lower dose  Hyperglycemia: no hx diabetes. A1c 6.1.  hx hyperglycemia. Recommend OP follow up  Procedures:  none  Consultations:  none  Discharge Exam: Filed Vitals:   07/09/14 1100  BP: 114/62  Pulse: 84  Temp: 98.4 F (36.9 C)  Resp: 20    General: well nourished appears comfortable Cardiovascular: RRR no MGR trace LE edema Respiratory: normal effort BS diminished but clear MS cast left arm intact. Fingers left hand warm and dry  Discharge Instructions    Current Discharge Medication List    START taking these medications   Details  alum & mag hydroxide-simeth (MAALOX/MYLANTA) 200-200-20 MG/5ML suspension Take 30 mLs  by mouth every 6 (six) hours as needed for indigestion or heartburn (dyspepsia). Qty: 355 mL, Refills: 0    folic acid (FOLVITE) 1 MG tablet Take 1 tablet (1 mg total) by  mouth daily.    Multiple Vitamin (MULTIVITAMIN WITH MINERALS) TABS tablet Take 1 tablet by mouth daily.    oxyCODONE (OXY IR/ROXICODONE) 5 MG immediate release tablet Take 1 tablet (5 mg total) by mouth every 6 (six) hours as needed for moderate pain. Qty: 30 tablet, Refills: 0      CONTINUE these medications which have CHANGED   Details  ALPRAZolam (XANAX) 1 MG tablet Take 1 tablet (1 mg total) by mouth every 6 (six) hours as needed for anxiety. Qty: 30 tablet, Refills: 0      CONTINUE these medications which have NOT CHANGED   Details  acetaminophen (TYLENOL) 500 MG tablet Take 500-1,000 mg by mouth every 6 (six) hours as needed for mild pain.    albuterol-ipratropium (COMBIVENT) 18-103 MCG/ACT inhaler Inhale 2 puffs into the lungs every 4 (four) hours as needed for wheezing or shortness of breath. Qty: 1 Inhaler, Refills: 1      STOP taking these medications     oxyCODONE-acetaminophen (PERCOCET) 5-325 MG per tablet      benzonatate (TESSALON) 200 MG capsule      levofloxacin (LEVAQUIN) 500 MG tablet      predniSONE (DELTASONE) 10 MG tablet        No Known Allergies Follow-up Information    Follow up with Cassell SmilesFUSCO,LAWRENCE J., MD. Schedule an appointment as soon as possible for a visit in 2 weeks.   Specialty:  Internal Medicine   Why:  for evaluation of medications for polypharmacy and ETOH consumption   Contact information:   [address] [place] [address] [phone]        The results of significant diagnostics from this hospitalization (including imaging, microbiology, ancillary and laboratory) are listed below for reference.    Significant Diagnostic Studies: Dg Chest 1 View  07/07/2014   CLINICAL DATA:  Multiple falls today.  Initial encounter.  EXAM: CHEST  1 VIEW  COMPARISON:  PA and lateral chest 03/16/2012 and CT chest 02/07/2011.  FINDINGS: The lungs are emphysematous but appear clear. Heart size is normal. No pneumothorax or pleural effusion  is identified. No focal bony abnormality is seen.  IMPRESSION: Emphysema without acute disease.   Electronically Signed   By: Drusilla Kannerhomas  Dalessio M.D.   On: 07/07/2014 14:39   Dg Wrist Complete Left  06/23/2014   CLINICAL DATA:  59 year old male with left wrist pain and multiple recent falls  EXAM: LEFT WRIST - COMPLETE 3+ VIEW  COMPARISON:  Concurrently obtained radiographs of the left hand  FINDINGS: Impacted and mildly comminuted distal radius fracture with dorsal tilt of the distal fracture fragment. There is associated surrounding soft tissue swelling. The carpus remains intact and congruent. No evidence of scaphoid fracture. 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 Soft tissue swelling extends into the hand and up the forearm.  IMPRESSION: Impacted and mildly comminuted distal radius fracture with dorsal tilt of the fracture fragments.  Extensive associated soft tissue swelling.   Electronically Signed   By: Malachy MoanHeath  McCullough M.D.   On: 06/23/2014 19:54   Ct Head Wo Contrast  07/07/2014   CLINICAL DATA:  59 year old male with diarrhea and frequent falls. Initial encounter.  EXAM: CT HEAD WITHOUT CONTRAST  TECHNIQUE: Contiguous axial images were obtained from the base of the skull through the  vertex without intravenous contrast.  COMPARISON:  None.  FINDINGS: Visualized paranasal sinuses and mastoids are clear. Visualized orbits and scalp soft tissues are within normal limits. No acute osseous abnormality identified.  Calcified atherosclerosis at the skull base. Cerebral volume is within normal limits for age. No suspicious intracranial vascular hyperdensity. No midline shift, ventriculomegaly, mass effect, evidence of mass lesion, intracranial hemorrhage or evidence of cortically based acute infarction. Gray-white matter differentiation is within normal limits throughout the brain.  IMPRESSION: Normal for age non contrast CT appearance of the brain.   Electronically Signed   By: Odessa Fleming M.D.    On: 07/07/2014 14:34   Dg Hand Complete Left  06/23/2014   CLINICAL DATA:  Left hand pain following trip and fall with difficulty moving fingers, initial encounter  EXAM: LEFT HAND - COMPLETE 3+ VIEW  COMPARISON:  None.  FINDINGS: There is an impacted comminuted fracture of the distal radius identified. Considerable soft tissue swelling in the hand and wrist is noted. Mild posterior angulation at the fracture site is seen. No distal ulnar fracture is seen. No other fractures are noted.  IMPRESSION: Distal radial fracture with impaction and angulation at the fracture site.   Electronically Signed   By: Alcide Clever M.D.   On: 06/23/2014 18:03    Microbiology: Recent Results (from the past 240 hour(s))  Clostridium Difficile by PCR     Status: None   Collection Time: 07/07/14  9:45 PM  Result Value Ref Range Status   C difficile by pcr NEGATIVE NEGATIVE Final     Labs: Basic Metabolic Panel:  Recent Labs Lab 07/07/14 1243 07/08/14 0709 07/09/14 0619  NA 136 139 137  K 3.8 3.2* 3.7  CL 98* 101 102  CO2 GLUCOSE 159* 156* 130*  BUN <5* <5* <5*  CREATININE 0.70 0.72 0.73  CALCIUM 8.3* 8.0* 7.7*   Liver Function Tests:  Recent Labs Lab 07/07/14 1243 07/08/14 0709  AST 99* 72*  ALT 40 33  ALKPHOS 73 63  BILITOT 1.2 1.5*  PROT 7.2 6.7  ALBUMIN 3.1* 2.8*   No results for input(s): LIPASE, AMYLASE in the last 168 hours.  Recent Labs Lab 07/07/14 1351  AMMONIA 43*   CBC:  Recent Labs Lab 07/07/14 1243 07/08/14 0709 07/09/14 0619  WBC 6.1 4.5 5.7  NEUTROABS 4.4  --   --   HGB 11.5* 11.0* 10.9*  HCT 35.0* 33.4* 33.4*  MCV 112.5* 115.2* 118.0*  PLT 122* 112* 118*   Cardiac Enzymes: No results for input(s): CKTOTAL, CKMB, CKMBINDEX, TROPONINI in the last 168 hours. BNP: BNP (last 3 results) No results for input(s): BNP in the last 8760 hours.  ProBNP (last 3 results) No results for input(s): PROBNP in the last 8760 hours.  CBG:  Recent Labs Lab  07/08/14 1719 07/08/14 2146 07/09/14 0808  GLUCAP 167* 110* 128*       Signed:  Toya Smothers M  [facility] 07/09/2014, 11:33 AM

## 2014-07-09 NOTE — Care Management Note (Signed)
Case Management Note  Patient Details  Name: Wesley Ho MRN: [id number] Date of Birth: 06-19-1955  Subjective/Objective:                  Pt admitted from home with weakness and diarrhea. Pt having frequent falls and failure to thrive at home.  Action/Plan: PT recommends SNF at discharge. [facility]. CSW to arrange discharge to facility today.  Expected Discharge Date:  07/11/14               Expected Discharge Plan:  Skilled Nursing Facility  In-House Referral:  Clinical Social Work  Discharge planning Services  CM Consult  Post Acute Care Choice:  NA Choice offered to:  NA  DME Arranged:    DME Agency:     HH Arranged:    HH Agency:     Status of Service:  Completed, signed off  Medicare Important Message Given:  No Date Medicare IM Given:    Medicare IM give by:    Date Additional Medicare IM Given:    Additional Medicare Important Message give by:     If discussed at Long Length of Stay Meetings, dates discussed:    Additional Comments:  Cheryl FlashBlackwell, Graceanne Guin Crowder, RN 07/09/2014, 12:25 PM

## 2014-07-09 NOTE — Clinical Social Work Placement (Signed)
   CLINICAL SOCIAL WORK PLACEMENT  NOTE  Date:  07/09/2014  Patient Details  Name: Wesley Ho MRN: [id number] Date of Birth: 1955/12/12  Clinical Social Work is seeking post-discharge placement for this patient at the Skilled  Nursing Facility level of care (*CSW will initial, date and re-position this form in  chart as items are completed):  Yes   Patient/family provided with [place] Clinical Social Work Department's list of facilities offering this level of care within the geographic area requested by the patient (or if unable, by the patient's family).  Yes   Patient/family informed of their freedom to choose among providers that offer the needed level of care, that participate in Medicare, Medicaid or managed care program needed by the patient, have an available bed and are willing to accept the patient.  Yes   Patient/family informed of [facility]'s ownership interest in [facility] and [facility], as well as of the fact that they are under no obligation to receive care at these facilities.  PASRR submitted to EDS on 07/09/14     PASRR number received on 07/09/14     Existing PASRR number confirmed on       FL2 transmitted to all facilities in geographic area requested by pt/family on 07/09/14     FL2 transmitted to all facilities within larger geographic area on       Patient informed that his/her managed care company has contracts with or will negotiate with certain facilities, including the following:        Yes   Patient/family informed of bed offers received.  [facility]     Physician recommends and patient chooses bed at      [facility] to be [facility]  [facility] on 07[facility].  Patient to be transferred to facility by  (RN)     Patient family notified on 07/09/14 of transfer.  Name of family member notified:   (daughter Chari ManningBecca)     PHYSICIAN Please sign FL2     Additional Comment:     _______________________________________________ Liliana Clinealdwell, Ayshia Gramlich Parks, LCSW 07/09/2014, 3:01 PM

## 2014-07-11 ENCOUNTER — Non-Acute Institutional Stay (SKILLED_NURSING_FACILITY): Payer: Medicare Other | Admitting: Internal Medicine

## 2014-07-11 DIAGNOSIS — J449 Chronic obstructive pulmonary disease, unspecified: Secondary | ICD-10-CM

## 2014-07-11 DIAGNOSIS — R270 Ataxia, unspecified: Secondary | ICD-10-CM | POA: Diagnosis not present

## 2014-07-11 DIAGNOSIS — R296 Repeated falls: Secondary | ICD-10-CM | POA: Diagnosis not present

## 2014-07-11 DIAGNOSIS — G609 Hereditary and idiopathic neuropathy, unspecified: Secondary | ICD-10-CM | POA: Diagnosis not present

## 2014-07-11 DIAGNOSIS — D539 Nutritional anemia, unspecified: Secondary | ICD-10-CM

## 2014-07-11 DIAGNOSIS — J4489 Other specified chronic obstructive pulmonary disease: Secondary | ICD-10-CM

## 2014-07-12 ENCOUNTER — Non-Acute Institutional Stay (SKILLED_NURSING_FACILITY): Payer: Medicare Other | Admitting: Internal Medicine

## 2014-07-12 ENCOUNTER — Other Ambulatory Visit: Payer: Self-pay | Admitting: *Deleted

## 2014-07-12 ENCOUNTER — Encounter (HOSPITAL_COMMUNITY)
Admission: RE | Admit: 2014-07-12 | Discharge: 2014-07-12 | Disposition: A | Discharge status: Home / Self Care | Payer: Medicare Other | Source: Skilled Nursing Facility | Attending: Internal Medicine | Admitting: Internal Medicine

## 2014-07-12 DIAGNOSIS — J449 Chronic obstructive pulmonary disease, unspecified: Secondary | ICD-10-CM | POA: Diagnosis not present

## 2014-07-12 DIAGNOSIS — R296 Repeated falls: Secondary | ICD-10-CM

## 2014-07-12 DIAGNOSIS — D539 Nutritional anemia, unspecified: Secondary | ICD-10-CM

## 2014-07-12 DIAGNOSIS — E1142 Type 2 diabetes mellitus with diabetic polyneuropathy: Secondary | ICD-10-CM

## 2014-07-12 DIAGNOSIS — D696 Thrombocytopenia, unspecified: Secondary | ICD-10-CM | POA: Diagnosis not present

## 2014-07-12 LAB — CK: CK TOTAL: 133 U/L (ref 49–397)

## 2014-07-12 MED ORDER — OXYCODONE HCL 5 MG PO TABS: 5.0000 mg | ORAL_TABLET | Freq: Four times a day (QID) | ORAL | Status: DC | PRN

## 2014-07-12 MED ORDER — ALPRAZOLAM 1 MG PO TABS: ORAL_TABLET | ORAL | Status: DC

## 2014-07-12 NOTE — Telephone Encounter (Signed)
[facility] 

## 2014-07-12 NOTE — Progress Notes (Addendum)
Patient ID: Wesley Ho, male   DOB: 07/28/1955, 59 y.o.   MRN: [id number]                 HISTORY & PHYSICAL  DATE:  07/11/2014         FACILITY: [facility]                     LEVEL OF CARE:   SNF   CHIEF COMPLAINT:  Admission to SNF, post stay at [facility], 07/07/2014 through 07/09/2014.        HISTORY OF PRESENT ILLNESS:  This is a 59 year-old man who lives [place] own [place].      He was admitted to hospital with complaints of persistent progressive generalized weakness and multiple falls at home, perhaps 5-10 in the last 2-3 months.     He also complains of a 2-3 week history of disabling liquid stools. He drinks between four and six cans of beer a day.    It was felt that his generalized weakness was likely multifactorial secondary to chronic diarrhea, physical deconditioning, COPD.  He did not have any diarrhea during the hospitalization.  Stool for C.diff was negative.    B12 level was borderline at 236.    I note he has an MCV of 115.2, a hemoglobin of 11.  RDW was not particularly impressive at 15.9.  His reticulocyte percentage was only 2.5.    His hemoglobin A1c was 6.1.      There were concerns for alcohol toxicity/chronic hepatic injury.  His ammonia level was only slightly elevated.    He had sustained a left radial fracture after a fall and had been to see Dr. Hilda LiasKeeling on 06/23/2014.  He is in a left arm cast.    LABORATORY DATA:  Relevant lab work includes an anemia panel showing an iron of 158, TIBC of 230, saturation of 69%, ferritin of 581, folate level of 15.1, and a B12 level of 236.   Hemoglobin was 11, MCV of 115.2, MCH of 37.9, and a platelet count of 112,000.  I note that he had B12 levels done in 2014, as well.    Potassium was 3.2.  This was corrected to 3.7.    His albumin was 2.8.    Liver function tests were essentially normal other than a total bilirubin of 1.5.    PAST MEDICAL HISTORY/PROBLEM LIST:                Gait ataxia with multiple falls.     Macrocytic anemia plus thrombocytopenia.    Chronic edema of the lower extremities on the background of a history of DVT.   The patient states he has only had one DVT.    History of alcohol use.  Labeled as abuse in the hospital.     Macrocytic anemia.    Recent left radial fracture.    COPD.  Not on home oxygen.    Hyperglycemia, with a hemoglobin A1c of 6.1.    CURRENT MEDICATIONS:  Discharge medications include:    Folic acid 1 mg a day.    Multivitamin daily.    Oxycodone 5 mg q.6 p.r.n.      Xanax 1 mg every 6 hours as needed for anxiety.    Tylenol [phone] q.6 p.r.n.      Combivent inhaler 2 puffs q.4 p.r.n.         SOCIAL HISTORY:  HOUSING:  The patient tells me he lives on his own.   Has two steps to get into his house.  Otherwise, he is on a single level.     FUNCTIONAL STATUS:  He is independent.  Does not use ambulatory assist devices.   ALCOHOL:  Drinks 4-6 beers per day.   No other alcohol history.   ILLICIT DRUGS:  No drug use history.    FAMILY HISTORY:                 MOTHER:  Diabetes in his mother.   FATHER:  Clotting disorder in his father.    REVIEW OF SYSTEMS:            CHEST/RESPIRATORY:  No shortness of breath.     CARDIAC:  No chest pain.    GI:  Diarrhea for 2-3 weeks prior to his hospitalization, although this has stopped.  No abdominal pain.   MUSCULOSKELETAL:  Extremities:  Pain in his left arm and a hematoma over the triceps area of his right arm near the elbow.      PHYSICAL EXAMINATION:   VITAL SIGNS:     PULSE:  86 and regular.       RESPIRATIONS:  18 and unlabored.   02 SATURATIONS:  96% on room air.     GENERAL APPEARANCE:  The patient is not in any distress.   HEENT:   MOUTH/THROAT:    Oral exam is normal.   CHEST/RESPIRATORY:  Shallow, but otherwise clear air entry.      CARDIOVASCULAR:   CARDIAC:  Heart sounds are normal.  There are no murmurs.  He appears to be  euvolemic.   GASTROINTESTINAL:   ABDOMEN:  Distended.     LIVER/SPLEEN/KIDNEY:   No liver, no spleen.  No stigmata.  No shifting dullness is noted.      GENITOURINARY:   BLADDER:  No bladder distention.  No CVA tenderness.   CIRCULATION:   EDEMA/VARICOSITIES:  Extremities:  Mild edema to just above the knee.   VASCULAR:   ARTERIAL:  Peripheral pulses are palpable.   NEUROLOGICAL:   CRANIAL NERVES:   SENSATION/STRENGTH:  He has antigravity strength in his lower extremities at 4/5.  Position sense is intact.    DEEP TENDON REFLEXES:  Reflexes are normal at the knee jerks, absent at the ankle jerks.  There was no response at the plantar to the Babinski test.  Reflexes are normal in his right arm.  Left arm is in a cast.   CEREBELLAR:  Normal finger to nose.     BALANCE/GAIT:  He is able to bring himself to a standing position with assistance.  However, his gait is wide-based and unsteady.  He is markedly fearful of falling.   PSYCHIATRIC:   MENTAL STATUS:    I see no cognitive issues here.    ASSESSMENT/PLAN:                     Gait ataxia with multiple falls.  This is going to be problematic.  He has had a recent left radial fracture and his arm is in a cast.  The patient states he has numbness in his feet and he may have a peripheral neuropathy.  He has not been diagnosed with diabetes.  He will need an aggressive attempt at physical therapy and occupational therapy.  I would wonder about a work-up for peripheral neuropathy.      Macrocytic anemia with thrombocytopenia and a borderline  B12 level.   I think this should be further evaluated with a methylmalonic acid level.  I note that he had a B12 level done two years ago, which suggests this is not new; and at least the macrocytosis could be due to alcohol consumption.   Nevertheless, with his constellation of findings, I think excluding B12 deficiency as best as possible is in order.    History of COPD.  This does not seem all that bad at  the bedside.  Nevertheless, he is mostly disabled from this.  He does not require oxygen.    History of multiple lung nodules, which I see in [facility] diagnosis [facility] from [facility].  I will need to look through this.    Profound diarrhea before he came into hospital, although this seems to have abated since his admission.  We will be vigilant about this for now.

## 2014-07-13 LAB — PATHOLOGIST SMEAR REVIEW

## 2014-07-14 LAB — METHYLMALONIC ACID, SERUM: METHYLMALONIC ACID, QUANTITATIVE: 232 nmol/L (ref 0–378)

## 2014-07-14 NOTE — Progress Notes (Addendum)
Patient ID: Wesley Ho, male   DOB: 24-Apr-1955, 59 y.o.   MRN: [id number]                PROGRESS NOTE  DATE:  07/12/2014            FACILITY: [facility]                         LEVEL OF CARE:   SNF   Acute Visit                        CHIEF COMPLAINT:  Gait analysis, review of medical records.      HISTORY OF PRESENT ILLNESS:  I admitted this patient to the building yesterday.    He has had a series of falls with complaints of generalized weakness.  A recent fall resulted in a left radial fracture for which he is in a cast.  He was actually admitted to hospital with generalized weakness.  It was felt that alcohol abuse played a role in a lot of the medical issues.    The patient is not a known diabetic.  His hemoglobin A1c was 6.1, although looking back through his lab work shows glucoses recently that consistently are above 126.  Dating as far back as April 2012, he had lab work showing a glucose of 130.    Also of interest, he has been fairly consistently macrocytic with both an elevated MCV and an elevated MCH dating back to at least 2012.  He has had two different B12 levels that I can see which were both in the 300 range.  In January 2014, this was 396, at which time his hemoglobin was not low.  More recently, he has had anemia with hemoglobins in the 11 range and more recently in the 10.9 range.  His MCV is very high with an MCV of 18, an MCH also elevated at 38.5.  His platelet count has been low in the 1-teens range.  However, that has only been in recent months.    Finally, some of his lab work seems to relate to having a history of HIV.  This was checked with a fourth generation test during this hospitalization and this was negative.  I think this is probably a misprint.    PHYSICAL EXAMINATION:   NEUROLOGICAL:    DEEP TENDON REFLEXES:  His knee jerks are actually quite brisk, right greater than left.  His ankle jerks are absent.  His Babinski response is  almost unobtainable.  Reflexes are present in his hands and arms, especially on the right (cast on the left).     BALANCE/GAIT:  He is able to bring himself up with some assistance.  His gait is wide-based and he almost feels for the floor.    ASSESSMENT/PLAN:                             Gait ataxia.  By definition, this man is a diabetic.  He complains of a numb feeling in his ankles and feet, and he probably has a neuropathy.    Macrocytic anemia with low platelets.  Although his B12 is in the normal range, most recently at 236, his folic acid level is 15.1.  I would classify his B12 level as "borderline".  I have, therefore, done a methylmalonic acid level on him.    HIV status.  This was tested and is negative.  I am not sure why this appears on some parts of this man's record.  I do not believe he has HIV.    History of a DVT.  He is not on anticoagulants.    History of COPD.  I do not see this as a major issue, either; certainly not that limiting in terms of his rehab.  History of alcohol abuse.  This certainly could be accounting for a lot of the issues here.  He has not had a CT scan of the liver since 2011, at which time he had minimal fatty infiltration.    Type 2 diabetes, by definition.  This man is diabetic and, simply based on likelihood, he probably has diabetic neuropathy.  I am not exactly sure why his hemoglobin A1c is discordant, although he was unwell recently with complaints of diarrhea which seems to have settled down.

## 2014-07-27 ENCOUNTER — Non-Acute Institutional Stay (SKILLED_NURSING_FACILITY): Payer: Medicare Other | Admitting: Internal Medicine

## 2014-07-27 ENCOUNTER — Encounter: Payer: Self-pay | Admitting: Internal Medicine

## 2014-07-27 DIAGNOSIS — R269 Unspecified abnormalities of gait and mobility: Secondary | ICD-10-CM

## 2014-07-27 DIAGNOSIS — J441 Chronic obstructive pulmonary disease with (acute) exacerbation: Secondary | ICD-10-CM

## 2014-07-27 DIAGNOSIS — S5292XD Unspecified fracture of left forearm, subsequent encounter for closed fracture with routine healing: Secondary | ICD-10-CM

## 2014-07-27 DIAGNOSIS — D649 Anemia, unspecified: Secondary | ICD-10-CM | POA: Diagnosis not present

## 2014-07-27 NOTE — Progress Notes (Signed)
Patient ID: Wesley Ho, male   DOB: Sep 28, 1955, 59 y.o.   MRN: [id number]   ,    ,              L This is a discharge note         FACILITY: [facility]                     LEVEL OF CARE:   SNF   CHIEF COMPLAINT: Discharge note        HISTORY OF PRESENT ILLNESS:  This is a 59 year-old man who lives in his own home in [place].      He was admitted to hospital with complaints of persistent progressive generalized weakness and multiple falls at home, perhaps 5-10 in the last 2-3 months.     He also complained of a 2-3 week history of disabling liquid stools. He drankbetween four and six cans of beer a day.    It was felt that his generalized weakness was likely multifactorial secondary to chronic diarrhea, physical deconditioning, COPD.  He did not have any diarrhea during the hospitalization.  Stool for C.diff was negative.    B12 level was borderline at 236.--The methylmalonic acid level was normal this was obtained in the facility       His hemoglobin A1c was 6.1.      There were concerns for alcohol toxicity/chronic hepatic injury.  His ammonia level was only slightly elevated.    He had sustained a left radial fracture after a fall and had been to see Dr. Hilda Lias on 06/23/2014.  He is in a left arm cast.  His stay here actually is been quite unremarkable he will be going home by himself he will need nursing support-as well as continued PT and OT with his history of fracture-he is using a walker with a left platform and appears to be doing fairly well with this.  Today he has no acute complaints.  He does have a history of diabetes but his blood sugars a been quite stable ranging from 98-2 171 area  He has lost weight about 25 pounds since admission although I suspect this is probably desired.      LABORATORY DATA: 07/09/2014 the VBAC was 5.7 hemoglobin 10.9 platelets 118. Sodium was 137 potassium 3.7 BUN 25 creatinine 0.73.       Relevant  lab work includes an anemia panel showing an iron of 158, TIBC of 230, saturation of 69%, ferritin of 581, folate level of 15.1, and a B12 level of 236.   Hemoglobin was 11, MCV of 115.2, MCH of 37.9, and a platelet count of 112,000.  I note that he had B12 levels done in 2014, as well.    Potassium was 3.2.  This was corrected to 3.7.    His albumin was 2.8.    Liver function tests were essentially normal other than a total bilirubin of 1.5.    PAST MEDICAL HISTORY/PROBLEM LIST:               Gait ataxia with multiple falls.     Macrocytic anemia plus thrombocytopenia.    Chronic edema of the lower extremities on the background of a history of DVT.   The patient states he has only had one DVT.    History of alcohol use.  Labeled as abuse in the hospital.     Macrocytic anemia.    Recent left radial fracture.    COPD.  Not on home oxygen.    Hyperglycemia, with a hemoglobin A1c of 6.1.    CURRENT MEDICATIONS:  Discharge medications include:    Folic acid 1 mg a day.    Multivitamin daily.    Oxycodone 5 mg q.6 p.r.n.      Xanax 1 mg every 6 hours as needed for anxiety.    Tylenol [id number] q.6 p.r.n.      Combivent inhaler 2 puffs q.4 p.r.n.         SOCIAL HISTORY:                   HOUSING:  The patient tells me he lives on his own.   Has two steps to get into his house.  Otherwise, he is on a single level.     FUNCTIONAL STATUS:  He is independent.  Does not use ambulatory assist devices.   ALCOHOL:  Drank 4-6 beers per day.   No other alcohol history.   ILLICIT DRUGS:  No drug use history.    FAMILY HISTORY:                 MOTHER:  Diabetes in his mother.   FATHER:  Clotting disorder in his father.    REVIEW OF SYSTEMS: Neuro no complaints of fever or chills.  Skin does not complain of any rashes or itching.  Head ears eyes nose mouth and throat does not clear visual changes or sore throat            CHEST/RESPIRATORY:  No shortness of breath.     CARDIAC:   No chest pain.    GI:  Diarrhea for 2-3 weeks prior to his hospitalization, although this has stopped.  No abdominal pain.   MUSCULOSKELETAL:  Extremities:  Pain in his left arm peers to be improved he is receiving oxycodone 5 mg every hours when necessary.      PHYSICAL EXAMINATION:   VITAL SIGNS:      Temperature is 98.1 pulse 68 respirations 20 blood pressures are somewhat variable running from 116-160 systolically 60s to 80s diastolically reduced to 33.6 this is down about 25 pounds since admission earlier this month.     GENERAL APPEARANCE:  The patient is not in any distress Skin is warm and dry.   HEENT:   MOUTH/THROAT:    Oral exam is normal.   CHEST/RESPIRATORY:  Shallow, but otherwise clear air entry.--No labored breathing      CARDIOVASCULAR:   CARDIAC:  Heart sounds are normal.  There are no murmurs. Has chronic lower extremity edema compression hose are in place bilaterally.   GASTROINTESTINAL:   ABDOMEN:  Distended. Onontender with positive bowel sounds      .   CIRCULATION:   EDEMA/VARICOSITIES:  Extremities:  Mild edema to just above the knee. Has compression hose on   VASCULAR:   ARTERIAL:  Peripheral pulses are palpable.   NEUROLOGICAL:   CRANIAL NERVES:    1 are grossly intact to speech is clear.     BALANCE/GAIT:  He is able to bring himself to a standing position and ambulate with his walker this appears to have improved fairly significantly during his stay here    PSYCHIATRIC:   MENTAL STATUS:    I see no cognitive issues here.    ASSESSMENT/PLAN:                     Gait ataxia with multiple falls--history of left arm fracture.  He appears to have improved here is ambulating better with his walker there is some suspicion he may have peripheral neuropathy-this would warrant follow up by primary care provider-he would benefit from continued PT and OT   So would benefit from nursing support for his multiple medical issues he does live alone-.                                                                                         He will need a rolling walker with liftt platform for ambulation significant fall risk     Macrocytic anemia with thrombocytopenia and a borderline B12 level.--Will update a CBC before discharge  Again methylmalonic acid level was 232-blood smear showed mild thrombocytopenia.    History of COPD.  T  His has not really been an issue during his stay here he is not oxygen dependent.    History of multiple lung nodules,--blood primary care provider.    Profound diarrhea   This appears to have resolved --will update a metabolic panel before discharge.    [id number] note greater than 30 minutes spent on this discharge summary

## 2014-07-28 ENCOUNTER — Encounter (HOSPITAL_COMMUNITY)
Admission: AD | Admit: 2014-07-28 | Discharge: 2014-07-28 | Disposition: A | Discharge status: Home / Self Care | Payer: Medicare Other | Source: Skilled Nursing Facility | Attending: Internal Medicine | Admitting: Internal Medicine

## 2014-07-28 LAB — COMPREHENSIVE METABOLIC PANEL
ALK PHOS: 55 U/L (ref 38–126)
ALT: 45 U/L (ref 17–63)
AST: 49 U/L — ABNORMAL HIGH (ref 15–41)
Albumin: 3.6 g/dL (ref 3.5–5.0)
Anion gap: 9 (ref 5–15)
BUN: 10 mg/dL (ref 6–20)
CO2: 29 mmol/L (ref 22–32)
Calcium: 9.2 mg/dL (ref 8.9–10.3)
Chloride: 99 mmol/L — ABNORMAL LOW (ref 101–111)
Creatinine, Ser: 0.72 mg/dL (ref 0.61–1.24)
GFR calc non Af Amer: >60 mL/min (ref >60)
Glucose, Bld: 149 mg/dL — ABNORMAL HIGH (ref 65–99)
Potassium: 3.7 mmol/L (ref 3.5–5.1)
Sodium: 137 mmol/L (ref 135–145)
TOTAL PROTEIN: 8.4 g/dL — AB (ref 6.5–8.1)
Total Bilirubin: 0.8 mg/dL (ref 0.3–1.2)

## 2014-07-28 LAB — CBC
HCT: 38 % — ABNORMAL LOW (ref 39.0–52.0)
Hemoglobin: 12.2 g/dL — ABNORMAL LOW (ref 13.0–17.0)
MCH: 36.3 pg — ABNORMAL HIGH (ref 26.0–34.0)
MCHC: 32.1 g/dL (ref 30.0–36.0)
MCV: 113.1 fL — ABNORMAL HIGH (ref 78.0–100.0)
Platelets: 217 10*3/uL (ref 150–400)
RBC: 3.36 MIL/uL — ABNORMAL LOW (ref 4.22–5.81)
RDW: 14.4 % (ref 11.5–15.5)
WBC: 5.8 10*3/uL (ref 4.0–10.5)

## 2014-07-29 DIAGNOSIS — F1010 Alcohol abuse, uncomplicated: Secondary | ICD-10-CM | POA: Diagnosis not present

## 2014-07-29 DIAGNOSIS — R600 Localized edema: Secondary | ICD-10-CM | POA: Diagnosis not present

## 2014-07-29 DIAGNOSIS — F419 Anxiety disorder, unspecified: Secondary | ICD-10-CM | POA: Diagnosis not present

## 2014-07-29 DIAGNOSIS — E46 Unspecified protein-calorie malnutrition: Secondary | ICD-10-CM | POA: Diagnosis not present

## 2014-07-29 DIAGNOSIS — S6292XD Unspecified fracture of left wrist and hand, subsequent encounter for fracture with routine healing: Secondary | ICD-10-CM | POA: Diagnosis not present

## 2014-07-29 DIAGNOSIS — Z9181 History of falling: Secondary | ICD-10-CM | POA: Diagnosis not present

## 2014-07-29 DIAGNOSIS — J449 Chronic obstructive pulmonary disease, unspecified: Secondary | ICD-10-CM | POA: Diagnosis not present

## 2014-07-29 DIAGNOSIS — Z720 Tobacco use: Secondary | ICD-10-CM | POA: Diagnosis not present

## 2014-07-30 DIAGNOSIS — E46 Unspecified protein-calorie malnutrition: Secondary | ICD-10-CM | POA: Diagnosis not present

## 2014-07-30 DIAGNOSIS — Z9181 History of falling: Secondary | ICD-10-CM | POA: Diagnosis not present

## 2014-07-30 DIAGNOSIS — Z720 Tobacco use: Secondary | ICD-10-CM | POA: Diagnosis not present

## 2014-07-30 DIAGNOSIS — F1010 Alcohol abuse, uncomplicated: Secondary | ICD-10-CM | POA: Diagnosis not present

## 2014-07-30 DIAGNOSIS — F419 Anxiety disorder, unspecified: Secondary | ICD-10-CM | POA: Diagnosis not present

## 2014-07-30 DIAGNOSIS — J449 Chronic obstructive pulmonary disease, unspecified: Secondary | ICD-10-CM | POA: Diagnosis not present

## 2014-07-30 DIAGNOSIS — S6292XD Unspecified fracture of left wrist and hand, subsequent encounter for fracture with routine healing: Secondary | ICD-10-CM | POA: Diagnosis not present

## 2014-07-30 DIAGNOSIS — R600 Localized edema: Secondary | ICD-10-CM | POA: Diagnosis not present

## 2014-08-05 DIAGNOSIS — R600 Localized edema: Secondary | ICD-10-CM | POA: Diagnosis not present

## 2014-08-05 DIAGNOSIS — Z720 Tobacco use: Secondary | ICD-10-CM | POA: Diagnosis not present

## 2014-08-05 DIAGNOSIS — Z9181 History of falling: Secondary | ICD-10-CM | POA: Diagnosis not present

## 2014-08-05 DIAGNOSIS — E46 Unspecified protein-calorie malnutrition: Secondary | ICD-10-CM | POA: Diagnosis not present

## 2014-08-05 DIAGNOSIS — J449 Chronic obstructive pulmonary disease, unspecified: Secondary | ICD-10-CM | POA: Diagnosis not present

## 2014-08-05 DIAGNOSIS — S6292XD Unspecified fracture of left wrist and hand, subsequent encounter for fracture with routine healing: Secondary | ICD-10-CM | POA: Diagnosis not present

## 2014-08-05 DIAGNOSIS — F419 Anxiety disorder, unspecified: Secondary | ICD-10-CM | POA: Diagnosis not present

## 2014-08-05 DIAGNOSIS — F1010 Alcohol abuse, uncomplicated: Secondary | ICD-10-CM | POA: Diagnosis not present

## 2014-08-11 DIAGNOSIS — S52599A Other fractures of lower end of unspecified radius, initial encounter for closed fracture: Secondary | ICD-10-CM | POA: Diagnosis not present

## 2014-08-12 DIAGNOSIS — Z9181 History of falling: Secondary | ICD-10-CM | POA: Diagnosis not present

## 2014-08-12 DIAGNOSIS — Z6838 Body mass index (BMI) 38.0-38.9, adult: Secondary | ICD-10-CM | POA: Diagnosis not present

## 2014-08-12 DIAGNOSIS — J449 Chronic obstructive pulmonary disease, unspecified: Secondary | ICD-10-CM | POA: Diagnosis not present

## 2014-08-12 DIAGNOSIS — S6292XD Unspecified fracture of left wrist and hand, subsequent encounter for fracture with routine healing: Secondary | ICD-10-CM | POA: Diagnosis not present

## 2014-08-12 DIAGNOSIS — F419 Anxiety disorder, unspecified: Secondary | ICD-10-CM | POA: Diagnosis not present

## 2014-08-12 DIAGNOSIS — L608 Other nail disorders: Secondary | ICD-10-CM | POA: Diagnosis not present

## 2014-08-12 DIAGNOSIS — Z720 Tobacco use: Secondary | ICD-10-CM | POA: Diagnosis not present

## 2014-08-12 DIAGNOSIS — F1010 Alcohol abuse, uncomplicated: Secondary | ICD-10-CM | POA: Diagnosis not present

## 2014-08-12 DIAGNOSIS — B351 Tinea unguium: Secondary | ICD-10-CM | POA: Diagnosis not present

## 2014-08-12 DIAGNOSIS — R600 Localized edema: Secondary | ICD-10-CM | POA: Diagnosis not present

## 2014-08-12 DIAGNOSIS — E46 Unspecified protein-calorie malnutrition: Secondary | ICD-10-CM | POA: Diagnosis not present

## 2014-08-13 DIAGNOSIS — F419 Anxiety disorder, unspecified: Secondary | ICD-10-CM | POA: Diagnosis not present

## 2014-08-13 DIAGNOSIS — J449 Chronic obstructive pulmonary disease, unspecified: Secondary | ICD-10-CM | POA: Diagnosis not present

## 2014-08-13 DIAGNOSIS — Z9181 History of falling: Secondary | ICD-10-CM | POA: Diagnosis not present

## 2014-08-13 DIAGNOSIS — F1010 Alcohol abuse, uncomplicated: Secondary | ICD-10-CM | POA: Diagnosis not present

## 2014-08-13 DIAGNOSIS — Z720 Tobacco use: Secondary | ICD-10-CM | POA: Diagnosis not present

## 2014-08-13 DIAGNOSIS — E46 Unspecified protein-calorie malnutrition: Secondary | ICD-10-CM | POA: Diagnosis not present

## 2014-08-13 DIAGNOSIS — S6292XD Unspecified fracture of left wrist and hand, subsequent encounter for fracture with routine healing: Secondary | ICD-10-CM | POA: Diagnosis not present

## 2014-08-13 DIAGNOSIS — R600 Localized edema: Secondary | ICD-10-CM | POA: Diagnosis not present

## 2014-08-17 ENCOUNTER — Ambulatory Visit (HOSPITAL_COMMUNITY): Payer: Medicare Other | Admitting: Specialist

## 2014-08-18 DIAGNOSIS — R600 Localized edema: Secondary | ICD-10-CM | POA: Diagnosis not present

## 2014-08-18 DIAGNOSIS — Z9181 History of falling: Secondary | ICD-10-CM | POA: Diagnosis not present

## 2014-08-18 DIAGNOSIS — J449 Chronic obstructive pulmonary disease, unspecified: Secondary | ICD-10-CM | POA: Diagnosis not present

## 2014-08-18 DIAGNOSIS — S6292XD Unspecified fracture of left wrist and hand, subsequent encounter for fracture with routine healing: Secondary | ICD-10-CM | POA: Diagnosis not present

## 2014-08-18 DIAGNOSIS — F1010 Alcohol abuse, uncomplicated: Secondary | ICD-10-CM | POA: Diagnosis not present

## 2014-08-18 DIAGNOSIS — E46 Unspecified protein-calorie malnutrition: Secondary | ICD-10-CM | POA: Diagnosis not present

## 2014-08-18 DIAGNOSIS — F419 Anxiety disorder, unspecified: Secondary | ICD-10-CM | POA: Diagnosis not present

## 2014-08-18 DIAGNOSIS — Z720 Tobacco use: Secondary | ICD-10-CM | POA: Diagnosis not present

## 2014-08-19 ENCOUNTER — Ambulatory Visit (HOSPITAL_COMMUNITY): Payer: Medicare Other | Attending: Orthopaedic Surgery | Admitting: Occupational Therapy

## 2014-08-19 ENCOUNTER — Encounter (HOSPITAL_COMMUNITY): Payer: Self-pay | Admitting: Occupational Therapy

## 2014-08-19 DIAGNOSIS — Z720 Tobacco use: Secondary | ICD-10-CM | POA: Diagnosis not present

## 2014-08-19 DIAGNOSIS — F419 Anxiety disorder, unspecified: Secondary | ICD-10-CM | POA: Diagnosis not present

## 2014-08-19 DIAGNOSIS — R600 Localized edema: Secondary | ICD-10-CM | POA: Diagnosis not present

## 2014-08-19 DIAGNOSIS — M629 Disorder of muscle, unspecified: Secondary | ICD-10-CM | POA: Diagnosis not present

## 2014-08-19 DIAGNOSIS — M25532 Pain in left wrist: Secondary | ICD-10-CM | POA: Diagnosis not present

## 2014-08-19 DIAGNOSIS — Z9181 History of falling: Secondary | ICD-10-CM | POA: Diagnosis not present

## 2014-08-19 DIAGNOSIS — R29898 Other symptoms and signs involving the musculoskeletal system: Secondary | ICD-10-CM | POA: Diagnosis not present

## 2014-08-19 DIAGNOSIS — S62102A Fracture of unspecified carpal bone, left wrist, initial encounter for closed fracture: Secondary | ICD-10-CM | POA: Diagnosis not present

## 2014-08-19 DIAGNOSIS — M6281 Muscle weakness (generalized): Secondary | ICD-10-CM | POA: Insufficient documentation

## 2014-08-19 DIAGNOSIS — E46 Unspecified protein-calorie malnutrition: Secondary | ICD-10-CM | POA: Diagnosis not present

## 2014-08-19 DIAGNOSIS — S6292XD Unspecified fracture of left wrist and hand, subsequent encounter for fracture with routine healing: Secondary | ICD-10-CM | POA: Diagnosis not present

## 2014-08-19 DIAGNOSIS — J449 Chronic obstructive pulmonary disease, unspecified: Secondary | ICD-10-CM | POA: Diagnosis not present

## 2014-08-19 DIAGNOSIS — M25639 Stiffness of unspecified wrist, not elsewhere classified: Secondary | ICD-10-CM

## 2014-08-19 DIAGNOSIS — F1010 Alcohol abuse, uncomplicated: Secondary | ICD-10-CM | POA: Diagnosis not present

## 2014-08-19 DIAGNOSIS — M6289 Other specified disorders of muscle: Secondary | ICD-10-CM

## 2014-08-19 NOTE — Therapy (Signed)
[facility] [address] [place], [place], [address] Phone: [phone]   Fax:  [phone]  Occupational Therapy Evaluation  Patient Details  Name: Wesley Ho MRN: [id number] Date of Birth: 1956/02/23 Referring Provider:  Valeria Batman, MD  Encounter Date: 08/19/2014      OT End of Session - 08/19/14 1404    Visit Number 1   Number of Visits 8   Date for OT Re-Evaluation 10/18/14  mini reassess 09/16/2014   Authorization Type [employer] Medicare   Authorization Time Period Before 10th visit   Authorization - Visit Number 1   Authorization - Number of Visits 10   OT Start Time 1301   OT Stop Time 1346   OT Time Calculation (min) 45 min   Activity Tolerance Patient tolerated treatment well   Behavior During Therapy [facility] for tasks assessed/performed      Past Medical History  Diagnosis Date  . DVT (deep venous thrombosis) 09/28/2010  . H/O Thrombocytopenia 09/28/2010  . Chronic edema of lower extremity 09/28/2010  . Multiple lung nodules 09/28/2010  . Fatigue   . Emphysema of lung   . Bronchitis   . COPD (chronic obstructive pulmonary disease)   . Chronic diarrhea   . Left radial fracture     Past Surgical History  Procedure Laterality Date  . Cholecystectomy    . Mouth surgery      There were no vitals filed for this visit.  Visit Diagnosis:  Left wrist fracture, closed, initial encounter  Left wrist pain  Tight fascia  Decreased range of motion of wrist  Decreased grip strength of left hand  Decreased pinch strength      Subjective Assessment - 08/19/14 1356    Subjective  S: I just got my cast off recently, maybe a week to 10 days ago.    Pertinent History Pt is a 59 y/o male s/p left wrist fracture due to a fall around 06/23/2014. Pt is using platform walker for mobility and has been at [facility] recently for short-term rehab. Pt was referred to occupational therapy for evaluation and treatment by  Dr. Cleophas Dunker.    Patient Stated Goals To be able to use my hand like normal.    Currently in Pain? No/denies           [facility] OT Assessment - 08/19/14 1310    Assessment   Diagnosis left wrist fracture   Onset Date 06/23/14   Prior Therapy None   Precautions   Precautions None   Balance Screen   Has the patient fallen in the past 6 months Yes   How many times? Several   Has the patient had a decrease in activity level because of a fear of falling?  Yes   Is the patient reluctant to leave their home because of a fear of falling?  Yes   Home  Environment   Family/patient expects to be discharged to: Private residence   Living Arrangements Alone   Available Help at Discharge Family   Type of Home House   Prior Function   Level of Independence Independent with basic ADLs   Vocation On disability   ADL   ADL comments Pt is having difficulty with grooming & hygiene tasks, using hairdryer, lifting lightweight objects.    Written Expression   Dominant Hand Right   Vision - History   Baseline Vision Wears glasses all the time   Cognition   Overall Cognitive Status Within  Functional Limits for tasks assessed   Coordination   9 Hole Peg Test Left;Right   Right 9 Hole Peg Test 36"   Left 9 Hole Peg Test    Edema   Edema MCP: L: 23, R: 22  Wrist: R: 20.5, L: 19   ROM / Strength   AROM / PROM / Strength AROM;PROM;Strength   Palpation   Palpation comment Moderate fascial restrictions in left dorsal hand and forearm   AROM   Overall AROM Comments Assessed seated, forearm and hand resting on table in neutral    AROM Assessment Site Wrist;Forearm   Right/Left Forearm Left   Left Forearm Pronation 78 Degrees   Left Forearm Supination 35 Degrees   Right/Left Wrist Left   Left Wrist Extension 0 Degrees   Left Wrist Flexion 25 Degrees   PROM   Overall PROM Comments Assessed seated with forearm resting on table   PROM Assessment Site Wrist   Right/Left Forearm --    Right/Left Wrist Left   Left Wrist Extension 30 Degrees   Left Wrist Flexion 30 Degrees   Strength   Overall Strength Comments Wrist not tested   Strength Assessment Site Wrist;Hand   Right/Left Wrist Left   Right/Left hand Left;Right   Right Hand Grip (lbs) 66   Right Hand Lateral Pinch 15 lbs   Right Hand 3 Point Pinch 12 lbs   Left Hand Gross Grasp Impaired  90%    Left Hand Grip (lbs) 14   Left Hand Lateral Pinch 5 lbs   Left Hand 3 Point Pinch 3 lbs           OT Education - 08/19/14 1403    Education provided Yes   Education Details self-arom exercises for wrist and forearm; retrograde massage   Person(s) Educated Patient   Methods Explanation;Demonstration;Handout   Comprehension Verbalized understanding;Returned demonstration          OT Short Term Goals - 08/19/14 1412    OT SHORT TERM GOAL #1   Title Pt will be educated on HEP.    Time 4   Period Weeks   Status New   OT SHORT TERM GOAL #2   Title Pt will decrease fascial restrictions from mod to min amount.    Time 4   Period Weeks   Status New   OT SHORT TERM GOAL #3   Title Pt will decrease pain to 3/10 or less during daily tasks.    Time 4   Period Weeks   Status New   OT SHORT TERM GOAL #4   Title Pt will increase wrist  AROM to [facility] to increase ability to reach for small items.    Time 4   Period Weeks   Status New   OT SHORT TERM GOAL #5   Title Pt will increase coordination by completing 9 hole peg test in under 45".    Time 4   Period Weeks   Status New   Additional Short Term Goals   Additional Short Term Goals Yes   OT SHORT TERM GOAL #6   Title Pt will increase grip strength by 10# and pinch strength by 5# to increase ability to grasp and hold small objects.    Time 4   Period Weeks   Status New           OT Long Term Goals - 08/19/14 1414    OT LONG TERM GOAL #1   Title Pt will return to highest level of  functioning and independence in daily activities.    Time 8   Period  Weeks   Status New   OT LONG TERM GOAL #2   Title Pt will decrease pain to 1/10 or less during daily tasks.    Time 8   Period Weeks   Status New   OT LONG TERM GOAL #3   Title Pt will decrease fascial restrictions to min amounts or less.    Time 8   Period Weeks   Status New   OT LONG TERM GOAL #4   Title Pt will increase AROM of left wrist to WNL to increase ability to complete daily tasks using LUE.    Time 8   Period Weeks   Status New   OT LONG TERM GOAL #5   Title Pt will increase grip strength by 25# and pinch strength by 8# to increase ability to hold hair dryer.    Time 8   Period Weeks   Long Term Additional Goals   Additional Long Term Goals Yes   OT LONG TERM GOAL #6   Title Pt will increase coordination by completing 9 hole peg test in 30" or less.    Time 8   Period Weeks   Status New               Plan - Sep 09, 2014 1405    Clinical Impression Statement A: Pt is a 59 y/o male s/p left wrist fracture around 06/23/2014, causing increased pain & fascial restrictions, decreased range of motion and strength limiting ability to complete B/IADL tasks. Pt is able to come only one time per week due to financial status. Dr. Cleophas Dunker has referred pt to occupational therapy for evaluation and treatment.Pt does not have protocol from MD. Provided pt with and educated pt on self-aarom exercises HEP.     Pt will benefit from skilled therapeutic intervention in order to improve on the following deficits (Retired) Decreased strength;Pain;Impaired UE functional use;Increased edema;Decreased range of motion;Increased fascial restricitons;Impaired flexibility   Rehab Potential Good   OT Frequency 1x / week   OT Duration 8 weeks   OT Treatment/Interventions Self-care/ADL training;Passive range of motion;Patient/family education;Cryotherapy;Electrical Stimulation;Therapeutic exercises;Manual Therapy;Moist Heat;Therapeutic activities   Plan P: Patient will benefit from skilled OT  services to increase functional performance using LUE during daily tasks. Treatment Plan: No protocol sent with pt. Myofascial release, edema management, passive stretching, PROM, AAROM, AROM, progressing to general strengthening.    OT Home Exercise Plan self-aarom   Consulted and Agree with Plan of Care Patient          G-Codes - 2014-09-09 1419    Functional Assessment Tool Used Functional grip strength: R: 66 L: 14-79% impairment   Functional Limitation Carrying, moving and handling objects   Carrying, Moving and Handling Objects Current Status (B1478) At least 60 percent but less than 80 percent impaired, limited or restricted   Carrying, Moving and Handling Objects Goal Status (G9562) At least 20 percent but less than 40 percent impaired, limited or restricted      Problem List Patient Active Problem List   Diagnosis Date Noted  . Hyperglycemia 07/08/2014  . COPD (chronic obstructive pulmonary disease)   . Dizziness 07/07/2014  . Generalized weakness 07/07/2014  . ETOH abuse 07/07/2014  . Macrocytic anemia 07/07/2014  . Left radial fracture 07/07/2014  . Anxiety 07/07/2014  . Diarrhea   . Acute bronchitis 03/16/2012  . Acute sinusitis 03/16/2012  . COPD exacerbation 03/16/2012  . Alcohol consumption  heavy 03/16/2012  . Tobacco abuse 03/16/2012  . Macrocytosis without anemia 03/16/2012  . Leg pain, bilateral 03/16/2012  . Personal history of DVT (deep vein thrombosis) 03/16/2012  . Hyponatremia 03/16/2012  . DVT (deep venous thrombosis) 09/28/2010  . Thrombocytopenia 09/28/2010  . Chronic edema of lower extremity 09/28/2010  . Multiple lung nodules 09/28/2010    Ezra Sites, OTR/L  [phone]  08/19/2014, 2:23 PM  [facility] [address] [place], [place], [address] Phone: [phone]   Fax:  [phone]

## 2014-08-25 ENCOUNTER — Encounter (HOSPITAL_COMMUNITY): Payer: Self-pay | Admitting: Occupational Therapy

## 2014-08-25 ENCOUNTER — Ambulatory Visit (HOSPITAL_COMMUNITY): Payer: Medicare Other | Admitting: Occupational Therapy

## 2014-08-25 DIAGNOSIS — M25639 Stiffness of unspecified wrist, not elsewhere classified: Secondary | ICD-10-CM

## 2014-08-25 DIAGNOSIS — R29898 Other symptoms and signs involving the musculoskeletal system: Secondary | ICD-10-CM

## 2014-08-25 DIAGNOSIS — M6281 Muscle weakness (generalized): Secondary | ICD-10-CM | POA: Diagnosis not present

## 2014-08-25 DIAGNOSIS — M629 Disorder of muscle, unspecified: Secondary | ICD-10-CM

## 2014-08-25 DIAGNOSIS — M25532 Pain in left wrist: Secondary | ICD-10-CM

## 2014-08-25 DIAGNOSIS — M6289 Other specified disorders of muscle: Secondary | ICD-10-CM

## 2014-08-25 DIAGNOSIS — S62102A Fracture of unspecified carpal bone, left wrist, initial encounter for closed fracture: Secondary | ICD-10-CM | POA: Diagnosis not present

## 2014-08-25 NOTE — Therapy (Signed)
[facility] [address] [place], [place], [address] Phone: [phone]   Fax:  [phone]  Occupational Therapy Treatment  Patient Details  Name: Wesley Ho MRN: [id number] Date of Birth: Mar 12, 1955 Referring Provider:  Elfredia Nevins, MD  Encounter Date: 08/25/2014      OT End of Session - 08/25/14 1416    Visit Number 2   Number of Visits 8   Date for OT Re-Evaluation 10/18/14  mini reassess 09/16/2014   Authorization Type [employer] Medicare   Authorization Time Period Before 10th visit   Authorization - Visit Number 2   Authorization - Number of Visits 10   OT Start Time 1320   OT Stop Time 1409   OT Time Calculation (min) 49 min   Activity Tolerance Patient tolerated treatment well   Behavior During Therapy [facility] for tasks assessed/performed      Past Medical History  Diagnosis Date  . DVT (deep venous thrombosis) 09/28/2010  . H/O Thrombocytopenia 09/28/2010  . Chronic edema of lower extremity 09/28/2010  . Multiple lung nodules 09/28/2010  . Fatigue   . Emphysema of lung   . Bronchitis   . COPD (chronic obstructive pulmonary disease)   . Chronic diarrhea   . Left radial fracture     Past Surgical History  Procedure Laterality Date  . Cholecystectomy    . Mouth surgery      There were no vitals filed for this visit.  Visit Diagnosis:  Left wrist pain  Tight fascia  Decreased range of motion of wrist  Decreased grip strength of left hand  Decreased pinch strength      Subjective Assessment - 08/25/14 1324    Subjective  S: I've been so busy I haven't done my exercises, I'll be honest.    Currently in Pain? Yes   Pain Score 3    Pain Location Wrist   Pain Orientation Left   Pain Descriptors / Indicators Aching   Pain Type Acute pain            [facility] OT Assessment - 08/25/14 1323    Assessment   Diagnosis left wrist fracture   Precautions   Precautions None           OT  Treatments/Exercises (OP) - 08/25/14 1324    Exercises   Exercises Elbow;Wrist;Hand   Elbow Exercises   Forearm Supination PROM;AROM;10 reps   Forearm Pronation PROM;AROM;10 reps   Wrist Flexion PROM;AROM;10 reps   Wrist Extension PROM;AROM;5 reps   Additional Elbow Exercises   Theraputty - Flatten yellow   Theraputty - Roll yellow   Theraputty - Grip yellow   Wrist Exercises   Wrist Radial Deviation PROM;AROM   Wrist Ulnar Deviation PROM;AROM;10 reps   Additional Wrist Exercises   Sponges 9, 10   Theraputty - Pinch yellow 3pt and lateral    Manual Therapy   Manual Therapy Myofascial release   Myofascial Release Myofascial release to left dorsal wrist and forearm to decrease pain and fascial restrictions and increase joint mobility.            OT Education - 08/25/14 1416    Education provided Yes   Education Details yellow theraputty   Person(s) Educated Patient   Methods Explanation;Demonstration;Handout   Comprehension Verbalized understanding;Returned demonstration          OT Short Term Goals - 08/25/14 1418    OT SHORT TERM GOAL #1   Title Pt will be educated on HEP.  Time 4   Period Weeks   Status On-going   OT SHORT TERM GOAL #2   Title Pt will decrease fascial restrictions from mod to min amount.    Time 4   Period Weeks   Status On-going   OT SHORT TERM GOAL #3   Title Pt will decrease pain to 3/10 or less during daily tasks.    Time 4   Period Weeks   Status On-going   OT SHORT TERM GOAL #4   Title Pt will increase wrist  AROM to [facility] to increase ability to reach for small items.    Time 4   Period Weeks   Status On-going   OT SHORT TERM GOAL #5   Title Pt will increase coordination by completing 9 hole peg test in under 45".    Time 4   Period Weeks   Status On-going   OT SHORT TERM GOAL #6   Title Pt will increase grip strength by 10# and pinch strength by 5# to increase ability to grasp and hold small objects.    Time 4   Period Weeks    Status On-going           OT Long Term Goals - 08/25/14 1418    OT LONG TERM GOAL #1   Title Pt will return to highest level of functioning and independence in daily activities.    Time 8   Period Weeks   Status On-going   OT LONG TERM GOAL #2   Title Pt will decrease pain to 1/10 or less during daily tasks.    Time 8   Period Weeks   Status On-going   OT LONG TERM GOAL #3   Title Pt will decrease fascial restrictions to min amounts or less.    Time 8   Period Weeks   Status On-going   OT LONG TERM GOAL #4   Title Pt will increase AROM of left wrist to WNL to increase ability to complete daily tasks using LUE.    Time 8   Period Weeks   Status On-going   OT LONG TERM GOAL #5   Title Pt will increase grip strength by 25# and pinch strength by 8# to increase ability to hold hair dryer.    Time 8   Period Weeks   Status On-going   OT LONG TERM GOAL #6   Title Pt will increase coordination by completing 9 hole peg test in 30" or less.    Time 8   Period Weeks   Status On-going               Plan - 08/25/14 1416    Clinical Impression Statement A: Inititated myofascial release, PROM, AROM, and theraputty exercises. Pt tolerated treatment well. Pt reports he has not completed his HEP since evaluation. Pt provided with yellow theraputty HEP and instructed to complete along with self-aarom HEP.    Plan P: Follow-up on HEPs. Add weighted towel stretch.         Problem List Patient Active Problem List   Diagnosis Date Noted  . Hyperglycemia 07/08/2014  . COPD (chronic obstructive pulmonary disease)   . Dizziness 07/07/2014  . Generalized weakness 07/07/2014  . ETOH abuse 07/07/2014  . Macrocytic anemia 07/07/2014  . Left radial fracture 07/07/2014  . Anxiety 07/07/2014  . Diarrhea   . Acute bronchitis 03/16/2012  . Acute sinusitis 03/16/2012  . COPD exacerbation 03/16/2012  . Alcohol consumption heavy 03/16/2012  . Tobacco abuse  03/16/2012  .  Macrocytosis without anemia 03/16/2012  . Leg pain, bilateral 03/16/2012  . Personal history of DVT (deep vein thrombosis) 03/16/2012  . Hyponatremia 03/16/2012  . DVT (deep venous thrombosis) 09/28/2010  . Thrombocytopenia 09/28/2010  . Chronic edema of lower extremity 09/28/2010  . Multiple lung nodules 09/28/2010    Ezra Sites, OTR/L  [phone]  08/25/2014, 2:19 PM  [facility] [address] [place], [place], [address] Phone: [phone]   Fax:  [phone]

## 2014-08-25 NOTE — Patient Instructions (Signed)
Home Exercises Program Theraputty Exercises  Do the following exercises 2 times a day using your affected hand.  1. Roll putty into a ball.  2. Make into a pancake.  3. Roll putty into a roll.  4. Pinch along log with first finger and thumb.   5. Make into a ball.  6. Roll it back into a log.   7. Pinch using thumb and side of first finger.  8. Roll into a ball, then flatten into a pancake.  9. Using your fingers, make putty into a mountain.   

## 2014-08-31 ENCOUNTER — Encounter (HOSPITAL_COMMUNITY): Payer: Medicare Other

## 2014-08-31 DIAGNOSIS — I10 Essential (primary) hypertension: Secondary | ICD-10-CM | POA: Diagnosis not present

## 2014-08-31 DIAGNOSIS — Z6838 Body mass index (BMI) 38.0-38.9, adult: Secondary | ICD-10-CM | POA: Diagnosis not present

## 2014-08-31 DIAGNOSIS — J449 Chronic obstructive pulmonary disease, unspecified: Secondary | ICD-10-CM | POA: Diagnosis not present

## 2014-08-31 DIAGNOSIS — Z1389 Encounter for screening for other disorder: Secondary | ICD-10-CM | POA: Diagnosis not present

## 2014-09-03 ENCOUNTER — Encounter (HOSPITAL_COMMUNITY): Payer: Self-pay

## 2014-09-03 ENCOUNTER — Ambulatory Visit (HOSPITAL_COMMUNITY): Payer: Medicare Other | Attending: Orthopaedic Surgery

## 2014-09-03 DIAGNOSIS — M25532 Pain in left wrist: Secondary | ICD-10-CM | POA: Diagnosis not present

## 2014-09-03 DIAGNOSIS — M629 Disorder of muscle, unspecified: Secondary | ICD-10-CM

## 2014-09-03 DIAGNOSIS — M6281 Muscle weakness (generalized): Secondary | ICD-10-CM | POA: Insufficient documentation

## 2014-09-03 DIAGNOSIS — R29898 Other symptoms and signs involving the musculoskeletal system: Secondary | ICD-10-CM | POA: Insufficient documentation

## 2014-09-03 DIAGNOSIS — M25632 Stiffness of left wrist, not elsewhere classified: Secondary | ICD-10-CM | POA: Diagnosis not present

## 2014-09-03 DIAGNOSIS — M6289 Other specified disorders of muscle: Secondary | ICD-10-CM

## 2014-09-03 NOTE — Patient Instructions (Addendum)
Composite Extension (Passive Flexor Stretch)   Sitting with elbows on table and palms together, slowly lower wrists toward table until stretch is felt. Be sure to keep palms together throughout stretch. Hold __10__ seconds. Relax. Repeat _3___ times. Do ____ sessions per day.  Copyright  VHI. All rights reserved.   For the stretches below: Hold 10 seconds and repeat 3 times.  WRIST EXTENSION STRETCH - TABLE  Place boths hand on a table as shown and gently lean forward until a stretch is felt.   Wrist Flexor Stretch  Use your unaffected hand to bend the affected wrist up as shown.   Keep the elbow straight on the affected side the entire time.      Wrist Flexor Stretch  Use your unaffected hand to bend the affected wrist up as shown.   Keep the elbow straight on the affected side the entire time.

## 2014-09-03 NOTE — Therapy (Signed)
[facility] [address] [place], [place], [address] Phone: [phone]   Fax:  [phone]  Occupational Therapy Treatment  Patient Details  Name: Wesley Ho MRN: [id number] Date of Birth: [birth year] Referring Provider:  Valeria BatmanWhitfield, Peter W, MD  Encounter Date: 09/03/2014      OT End of Session - 09/03/14 1412    Visit Number 3   Number of Visits 8   Date for OT Re-Evaluation 10/18/14  mini reassess 09/16/2014   Authorization Type [employer] Medicare   Authorization Time Period Before 10th visit   Authorization - Visit Number 3   Authorization - Number of Visits 10   OT Start Time 1300   OT Stop Time 1347   OT Time Calculation (min) 47 min   Activity Tolerance Patient tolerated treatment well   Behavior [facility]/performed      Past Medical History  Diagnosis Date  . DVT (deep venous thrombosis) 09/28/2010  . H/O Thrombocytopenia 09/28/2010  . Chronic edema of lower extremity 09/28/2010  . Multiple lung nodules 09/28/2010  . Fatigue   . Emphysema of lung   . Bronchitis   . COPD (chronic obstructive pulmonary disease)   . Chronic diarrhea   . Left radial fracture     Past Surgical History  Procedure Laterality Date  . Cholecystectomy    . Mouth surgery      There were no vitals filed for this visit.  Visit Diagnosis:  Tight fascia  Left wrist pain  Stiffness of wrist joint, left      Subjective Assessment - 09/03/14 1328    Subjective  S: I am unable to do the putty at home. It is just too painful.   Currently in Pain? Yes   Pain Score 5    Pain Location Wrist   Pain Orientation Left   Pain Descriptors / Indicators Aching   Pain Type Acute pain            [facility] OT Assessment - 09/03/14 1329    Assessment   Diagnosis left wrist fracture   Precautions   Precautions None                  OT Treatments/Exercises (OP) - 09/03/14 1329    Exercises   Exercises  Elbow;Wrist;Hand   Elbow Exercises   Forearm Supination PROM;AROM;10 reps   Forearm Pronation PROM;AROM;10 reps   Wrist Flexion PROM;AROM;10 reps   Wrist Extension PROM;AROM;5 reps   Weighted Stretch Over Towel Roll   Supination - Weighted Stretch 1 pound;60 seconds   Pronation - Weighted Stretch 1 pound;60 seconds   Wrist Flexion - Weighted Stretch 1 pound;60 seconds   Wrist Extension - Weighted Stretch 1 pound;60 seconds   Radial Deviation - Weighted Stretch 1 pound;60 seconds   Manual Therapy   Manual Therapy Myofascial release;Muscle Energy Technique   Myofascial Release Myofascial release to left dorsal wrist and forearm to decrease pain and fascial restrictions and increase joint mobility.    Muscle Energy Technique Muscle energy technique to left wrist flexors and extensors and forearm pronators and supinators to relax tone and muscle spasm and improve range of motion.                OT Education - 09/03/14 1411    Education provided Yes   Education Details wrist stretches   Person(s) Educated Patient   Methods Explanation;Demonstration;Handout   Comprehension Verbalized understanding;Returned demonstration  OT Short Term Goals - 08/25/14 1418    OT SHORT TERM GOAL #1   Title Pt will be educated on HEP.    Time 4   Period Weeks   Status On-going   OT SHORT TERM GOAL #2   Title Pt will decrease fascial restrictions from mod to min amount.    Time 4   Period Weeks   Status On-going   OT SHORT TERM GOAL #3   Title Pt will decrease pain to 3/10 or less during daily tasks.    Time 4   Period Weeks   Status On-going   OT SHORT TERM GOAL #4   Title Pt will increase wrist  AROM to [facility] to increase ability to reach for small items.    Time 4   Period Weeks   Status On-going   OT SHORT TERM GOAL #5   Title Pt will increase coordination by completing 9 hole peg test in under 45".    Time 4   Period Weeks   Status On-going   OT SHORT TERM GOAL #6    Title Pt will increase grip strength by 10# and pinch strength by 5# to increase ability to grasp and hold small objects.    Time 4   Period Weeks   Status On-going           OT Long Term Goals - 08/25/14 1418    OT LONG TERM GOAL #1   Title Pt will return to highest level of functioning and independence in daily activities.    Time 8   Period Weeks   Status On-going   OT LONG TERM GOAL #2   Title Pt will decrease pain to 1/10 or less during daily tasks.    Time 8   Period Weeks   Status On-going   OT LONG TERM GOAL #3   Title Pt will decrease fascial restrictions to min amounts or less.    Time 8   Period Weeks   Status On-going   OT LONG TERM GOAL #4   Title Pt will increase AROM of left wrist to WNL to increase ability to complete daily tasks using LUE.    Time 8   Period Weeks   Status On-going   OT LONG TERM GOAL #5   Title Pt will increase grip strength by 25# and pinch strength by 8# to increase ability to hold hair dryer.    Time 8   Period Weeks   Status On-going   OT LONG TERM GOAL #6   Title Pt will increase coordination by completing 9 hole peg test in 30" or less.    Time 8   Period Weeks   Status On-going               Plan - 09/03/14 1412    Clinical Impression Statement A: Added muscle energy technique and weighted wrist stretch with 1#. Patient tolerated well. Added wrist stretches for HEP. Pt reports that the theraputty is extremely painful and he is not able to do it at home. Informed patient to hold off on theraputty at this time if it causes increased pain. We will address theraputty pain at next session.    Plan P: Cont with theraputty to assess pain during task.         Problem List Patient Active Problem List   Diagnosis Date Noted  . Hyperglycemia 07/08/2014  . COPD (chronic obstructive pulmonary disease)   . Dizziness 07/07/2014  . Generalized  weakness 07/07/2014  . ETOH abuse 07/07/2014  . Macrocytic anemia 07/07/2014  .  Left radial fracture 07/07/2014  . Anxiety 07/07/2014  . Diarrhea   . Acute bronchitis 03/16/2012  . Acute sinusitis 03/16/2012  . COPD exacerbation 03/16/2012  . Alcohol consumption heavy 03/16/2012  . Tobacco abuse 03/16/2012  . Macrocytosis without anemia 03/16/2012  . Leg pain, bilateral 03/16/2012  . Personal history of DVT (deep vein thrombosis) 03/16/2012  . Hyponatremia 03/16/2012  . DVT (deep venous thrombosis) 09/28/2010  . Thrombocytopenia 09/28/2010  . Chronic edema of lower extremity 09/28/2010  . Multiple lung nodules 09/28/2010    Limmie Patricia, OTR/L,CBIS  [phone]  09/03/2014, 2:15 PM  [facility] [address] [place], [place], [address] Phone: [phone]   Fax:  [phone]

## 2014-09-07 ENCOUNTER — Encounter (HOSPITAL_COMMUNITY): Payer: Medicare Other | Admitting: Occupational Therapy

## 2014-09-09 ENCOUNTER — Ambulatory Visit (HOSPITAL_COMMUNITY): Payer: Medicare Other | Attending: Orthopaedic Surgery

## 2014-09-09 ENCOUNTER — Encounter (HOSPITAL_COMMUNITY): Payer: Medicare Other

## 2014-09-09 ENCOUNTER — Encounter (HOSPITAL_COMMUNITY): Payer: Self-pay

## 2014-09-09 DIAGNOSIS — J449 Chronic obstructive pulmonary disease, unspecified: Secondary | ICD-10-CM | POA: Diagnosis not present

## 2014-09-09 DIAGNOSIS — R29898 Other symptoms and signs involving the musculoskeletal system: Secondary | ICD-10-CM

## 2014-09-09 DIAGNOSIS — F419 Anxiety disorder, unspecified: Secondary | ICD-10-CM | POA: Diagnosis not present

## 2014-09-09 DIAGNOSIS — M6289 Other specified disorders of muscle: Secondary | ICD-10-CM

## 2014-09-09 DIAGNOSIS — H2513 Age-related nuclear cataract, bilateral: Secondary | ICD-10-CM | POA: Diagnosis not present

## 2014-09-09 DIAGNOSIS — M25532 Pain in left wrist: Secondary | ICD-10-CM | POA: Diagnosis not present

## 2014-09-09 DIAGNOSIS — M25632 Stiffness of left wrist, not elsewhere classified: Secondary | ICD-10-CM

## 2014-09-09 DIAGNOSIS — M629 Disorder of muscle, unspecified: Secondary | ICD-10-CM

## 2014-09-09 NOTE — Therapy (Signed)
[facility] [address] [place], [place], [address] Phone: [phone]   Fax:  [phone]  Occupational Therapy Treatment  Patient Details  Name: Wesley Ho MRN: [id number] Date of Birth: 07/05/1955 Referring Provider:  Valeria Batman, MD  Encounter Date: 09/09/2014      OT End of Session - 09/09/14 1006    Visit Number 4   Number of Visits 8   Date for OT Re-Evaluation 10/18/14  mini reassess 09/16/2014   Authorization Type [employer] Medicare   Authorization Time Period Before 10th visit   Authorization - Visit Number 4   Authorization - Number of Visits 10   OT Start Time [id number]   OT Stop Time 0930   OT Time Calculation (min) 38 min   Activity Tolerance Patient tolerated treatment well   Behavior During Therapy [facility] for tasks assessed/performed      Past Medical History  Diagnosis Date  . DVT (deep venous thrombosis) 09/28/2010  . H/O Thrombocytopenia 09/28/2010  . Chronic edema of lower extremity 09/28/2010  . Multiple lung nodules 09/28/2010  . Fatigue   . Emphysema of lung   . Bronchitis   . COPD (chronic obstructive pulmonary disease)   . Chronic diarrhea   . Left radial fracture     Past Surgical History  Procedure Laterality Date  . Cholecystectomy    . Mouth surgery      There were no vitals filed for this visit.  Visit Diagnosis:  Left wrist pain  Tight fascia  Stiffness of wrist joint, left  Decreased pinch strength      Subjective Assessment - 09/09/14 1005    Subjective  S: I haven't done any exercises or stretches. I haven't had time.    Currently in Pain? Yes   Pain Score 4    Pain Location Wrist   Pain Orientation Left   Pain Descriptors / Indicators Aching   Pain Type Acute pain            [facility] OT Assessment - 09/09/14 0908    Assessment   Diagnosis left wrist fracture   Precautions   Precautions None                  OT Treatments/Exercises (OP) - 09/09/14 0908    Exercises   Exercises Elbow;Wrist;Hand   Elbow Exercises   Forearm Supination PROM;AROM;10 reps   Forearm Pronation PROM;AROM;10 reps   Wrist Flexion PROM;AROM;10 reps   Wrist Extension PROM;AROM;5 reps   Additional Elbow Exercises   Theraputty - Flatten yellow - standning   Theraputty - Roll yellow   Theraputty - Grip yellow   Wrist Exercises   Wrist Radial Deviation PROM;AROM;10 reps   Wrist Ulnar Deviation PROM;AROM;10 reps   Other wrist exercises Wrist flexion/extension stretch; 3 reps; 10" hold   Additional Wrist Exercises   Theraputty Flatten;Roll;Pinch   Manual Therapy   Manual Therapy Myofascial release;Muscle Energy Technique   Myofascial Release Myofascial release to left dorsal wrist and forearm to decrease pain and fascial restrictions and increase joint mobility.    Muscle Energy Technique Muscle energy technique to left wrist flexors and extensors and forearm pronators and supinators to relax tone and muscle spasm and improve range of motion.                  OT Short Term Goals - 08/25/14 1418    OT SHORT TERM GOAL #1   Title Pt will be educated on HEP.  Time 4   Period Weeks   Status On-going   OT SHORT TERM GOAL #2   Title Pt will decrease fascial restrictions from mod to min amount.    Time 4   Period Weeks   Status On-going   OT SHORT TERM GOAL #3   Title Pt will decrease pain to 3/10 or less during daily tasks.    Time 4   Period Weeks   Status On-going   OT SHORT TERM GOAL #4   Title Pt will increase wrist  AROM to [facility] to increase ability to reach for small items.    Time 4   Period Weeks   Status On-going   OT SHORT TERM GOAL #5   Title Pt will increase coordination by completing 9 hole peg test in under 45".    Time 4   Period Weeks   Status On-going   OT SHORT TERM GOAL #6   Title Pt will increase grip strength by 10# and pinch strength by 5# to increase ability to grasp and hold small objects.    Time 4   Period Weeks   Status  On-going           OT Long Term Goals - 08/25/14 1418    OT LONG TERM GOAL #1   Title Pt will return to highest level of functioning and independence in daily activities.    Time 8   Period Weeks   Status On-going   OT LONG TERM GOAL #2   Title Pt will decrease pain to 1/10 or less during daily tasks.    Time 8   Period Weeks   Status On-going   OT LONG TERM GOAL #3   Title Pt will decrease fascial restrictions to min amounts or less.    Time 8   Period Weeks   Status On-going   OT LONG TERM GOAL #4   Title Pt will increase AROM of left wrist to WNL to increase ability to complete daily tasks using LUE.    Time 8   Period Weeks   Status On-going   OT LONG TERM GOAL #5   Title Pt will increase grip strength by 25# and pinch strength by 8# to increase ability to hold hair dryer.    Time 8   Period Weeks   Status On-going   OT LONG TERM GOAL #6   Title Pt will increase coordination by completing 9 hole peg test in 30" or less.    Time 8   Period Weeks   Status On-going               Plan - 09/09/14 1006    Clinical Impression Statement A: Pt reports that he doesn't have time to do his HEP at home. Discussed the importance of completing his HEP at home especially since he is only coming to therapy 1x a week and we only spend 45 minutes with him. Pt verbailzed understanding. Reviewed HEP stretches and theraputty exercises. Pt tolerated theraputty this date without issues.    Plan P: Cont with handgripper tasks. Add tweezer task.        Problem List Patient Active Problem List   Diagnosis Date Noted  . Hyperglycemia 07/08/2014  . COPD (chronic obstructive pulmonary disease)   . Dizziness 07/07/2014  . Generalized weakness 07/07/2014  . ETOH abuse 07/07/2014  . Macrocytic anemia 07/07/2014  . Left radial fracture 07/07/2014  . Anxiety 07/07/2014  . Diarrhea   . Acute bronchitis 03/16/2012  .  Acute sinusitis 03/16/2012  . COPD exacerbation 03/16/2012  .  Alcohol consumption heavy 03/16/2012  . Tobacco abuse 03/16/2012  . Macrocytosis without anemia 03/16/2012  . Leg pain, bilateral 03/16/2012  . Personal history of DVT (deep vein thrombosis) 03/16/2012  . Hyponatremia 03/16/2012  . DVT (deep venous thrombosis) 09/28/2010  . Thrombocytopenia 09/28/2010  . Chronic edema of lower extremity 09/28/2010  . Multiple lung nodules 09/28/2010    Limmie PatriciaLaura Cesiah Westley, OTR/L,CBIS  [phone]  09/09/2014, 10:36 AM  [facility] [address] [place], [place], [address] Phone: [phone]   Fax:  [phone]

## 2014-09-14 ENCOUNTER — Encounter (HOSPITAL_COMMUNITY): Payer: Medicare Other | Admitting: Occupational Therapy

## 2014-09-16 ENCOUNTER — Encounter (HOSPITAL_COMMUNITY): Payer: Self-pay

## 2014-09-16 ENCOUNTER — Ambulatory Visit (HOSPITAL_COMMUNITY): Payer: Medicare Other

## 2014-09-16 DIAGNOSIS — M629 Disorder of muscle, unspecified: Secondary | ICD-10-CM | POA: Diagnosis not present

## 2014-09-16 DIAGNOSIS — M25532 Pain in left wrist: Secondary | ICD-10-CM

## 2014-09-16 DIAGNOSIS — M25632 Stiffness of left wrist, not elsewhere classified: Secondary | ICD-10-CM | POA: Diagnosis not present

## 2014-09-16 DIAGNOSIS — R29898 Other symptoms and signs involving the musculoskeletal system: Secondary | ICD-10-CM | POA: Diagnosis not present

## 2014-09-16 DIAGNOSIS — M6281 Muscle weakness (generalized): Secondary | ICD-10-CM | POA: Diagnosis not present

## 2014-09-16 NOTE — Therapy (Signed)
[place], [place], [address] Phone: [phone]   Fax:  [phone]  Occupational Therapy Treatment & Reassessment  Patient Details  Name: Wesley Ho MRN: [id number] Date of Birth: 02/03/1956 Referring Provider:  Garald Balding, MD  Encounter Date: 09/16/2014      OT End of Session - 09/16/14 1334    Visit Number 5   Number of Visits 8   Date for OT Re-Evaluation 10/18/14   Authorization Type [employer] Medicare   Authorization Time Period Before 10th visit   Authorization - Visit Number 5   Authorization - Number of Visits 10   OT Start Time 1300   OT Stop Time 1346   OT Time Calculation (min) 46 min   Activity Tolerance Patient tolerated treatment well   Behavior During Therapy [facility] for tasks assessed/performed      Past Medical History  Diagnosis Date  . DVT (deep venous thrombosis) 09/28/2010  . H/O Thrombocytopenia 09/28/2010  . Chronic edema of lower extremity 09/28/2010  . Multiple lung nodules 09/28/2010  . Fatigue   . Emphysema of lung   . Bronchitis   . COPD (chronic obstructive pulmonary disease)   . Chronic diarrhea   . Left radial fracture     Past Surgical History  Procedure Laterality Date  . Cholecystectomy    . Mouth surgery      There were no vitals filed for this visit.  Visit Diagnosis:  Left wrist pain  Decreased pinch strength  Decreased grip strength of left hand  Stiffness of wrist joint, left      Subjective Assessment - 09/16/14 1613    Subjective  S: To be honest I haven't done much with my wrist in the sense of the exercises. I have noticed that I'm using it more every day.    Currently in Pain? Yes   Pain Score 2    Pain Location Wrist   Pain Orientation Left   Pain Descriptors / Indicators Aching   Pain Type Acute pain            [facility] OT Assessment - 09/16/14 1308    Assessment   Diagnosis left wrist fracture   Precautions   Precautions None    Coordination   Left 9 Hole Peg Test 42.2"  on eval: 1'02"   Edema   Edema Wrist: 18.8 cm (on eval: 19 cm) MCP: 21 cm (on eval: 23 cm)   AROM   Overall AROM Comments Assessed seated, forearm and hand resting on table in neutral    Right/Left Forearm Left   Left Forearm Pronation 90 Degrees  on eval: 78   Left Forearm Supination 66 Degrees  on eval: 35   Right/Left Wrist Left   Left Wrist Extension 56 Degrees  on eval: 0   Left Wrist Flexion 40 Degrees  on eval: 25   Strength   Left Hand Grip (lbs) 40  on eval: 14   Left Hand Lateral Pinch 17 lbs  on eval: 5   Left Hand 3 Point Pinch 12 lbs  on eval: 3                  OT Treatments/Exercises (OP) - 09/16/14 1333    Exercises   Exercises Elbow;Wrist;Hand   Additional Elbow Exercises   Hand Gripper with Large Beads 6/6 beads with gripper set at 25#   Hand Gripper with Medium Beads 13/13 beads with gripper set at 25#  Hand Gripper with Small Beads 16/16 beads with gripper set at 20#   Additional Wrist Exercises   Sponges 21, 23                  OT Short Term Goals - 09/16/14 1321    OT SHORT TERM GOAL #1   Title Pt will be educated on HEP.    Time 4   Period Weeks   Status On-going   OT SHORT TERM GOAL #2   Title Pt will decrease fascial restrictions from mod to min amount.    Time 4   Period Weeks   Status Achieved   OT SHORT TERM GOAL #3   Title Pt will decrease pain to 3/10 or less during daily tasks.    Time 4   Period Weeks   Status Achieved   OT SHORT TERM GOAL #4   Title Pt will increase wrist  AROM to [facility] to increase ability to reach for small items.    Time 4   Period Weeks   Status Achieved   OT SHORT TERM GOAL #5   Title Pt will increase coordination by completing 9 hole peg test in under 45".    Time 4   Period Weeks   Status Achieved   OT SHORT TERM GOAL #6   Title Pt will increase grip strength by 10# and pinch strength by 5# to increase ability to grasp and hold  small objects.    Time 4   Period Weeks   Status Achieved           OT Long Term Goals - 09/16/14 1324    OT LONG TERM GOAL #1   Title Pt will return to highest level of functioning and independence in daily activities.    Time 8   Period Weeks   Status On-going   OT LONG TERM GOAL #2   Title Pt will decrease pain to 1/10 or less during daily tasks.    Time 8   Period Weeks   Status On-going   OT LONG TERM GOAL #3   Title Pt will decrease fascial restrictions to min amounts or less.    Time 8   Period Weeks   Status On-going   OT LONG TERM GOAL #4   Title Pt will increase AROM of left wrist to WNL to increase ability to complete daily tasks using LUE.    Time 8   Period Weeks   Status On-going   OT LONG TERM GOAL #5   Title Pt will increase grip strength by 25# and pinch strength by 8# to increase ability to hold hair dryer.    Time 8   Period Weeks   Status Achieved   OT LONG TERM GOAL #6   Title Pt will increase coordination by completing 9 hole peg test in 30" or less.    Time 8   Period Weeks   Status On-going               Plan - 09/16/14 1617    Clinical Impression Statement A: Mini reassessment completed this date. patient has met all STGs and 1 LTG. Pt is making great progress towards therapy goals. Pt reports that he has noticed that he's using his LUE more for daily tasks. He continues to have difficulty with buttons on jeans and pinch strength during every day tasks.    Plan P: Add tweezer activity. Cont with gripper tasks.  G-Codes - 09/16/14 1615    Functional Assessment Tool Used Functional grip strength: R: 66 L: 40 (40% impaired)   Functional Limitation Carrying, moving and handling objects   Carrying, Moving and Handling Objects Current Status (Z5638) At least 40 percent but less than 60 percent impaired, limited or restricted   Carrying, Moving and Handling Objects Goal Status (V5643) At least 20 percent but less than 40  percent impaired, limited or restricted      Problem List Patient Active Problem List   Diagnosis Date Noted  . Hyperglycemia 07/08/2014  . COPD (chronic obstructive pulmonary disease)   . Dizziness 07/07/2014  . Generalized weakness 07/07/2014  . ETOH abuse 07/07/2014  . Macrocytic anemia 07/07/2014  . Left radial fracture 07/07/2014  . Anxiety 07/07/2014  . Diarrhea   . Acute bronchitis 03/16/2012  . Acute sinusitis 03/16/2012  . COPD exacerbation 03/16/2012  . Alcohol consumption heavy 03/16/2012  . Tobacco abuse 03/16/2012  . Macrocytosis without anemia 03/16/2012  . Leg pain, bilateral 03/16/2012  . Personal history of DVT (deep vein thrombosis) 03/16/2012  . Hyponatremia 03/16/2012  . DVT (deep venous thrombosis) 09/28/2010  . Thrombocytopenia 09/28/2010  . Chronic edema of lower extremity 09/28/2010  . Multiple lung nodules 09/28/2010   Occupational Therapy Progress Note  Dates of Reporting Period:08/19/14  to 09/16/14  Objective Reports of Subjective Statement: See clinical impression statement above  Objective Measurements: See OT assessment for measurements.  Goal Update: No updates needed. Patient has met all short term goals and is progressing towards LTGs.  Plan: Cont therapy 1x a week to focus on increasing strength, FMC, and AROM needed to complete daily tasks.  Reason Skilled Services are Required: Pt will benefit from OT services to increase functional use of LUE during daily tasks. Patient continues to have deficits with strength, FMC, and ROM.   Ailene Ravel, OTR/L,CBIS  [phone]  09/16/2014, 4:20 PM  [place] [address] [place], [place], [address] Phone: [phone]   Fax:  [phone]

## 2014-09-21 ENCOUNTER — Encounter (HOSPITAL_COMMUNITY): Payer: Medicare Other | Admitting: Occupational Therapy

## 2014-09-23 ENCOUNTER — Ambulatory Visit (HOSPITAL_COMMUNITY): Payer: Medicare Other | Admitting: Occupational Therapy

## 2014-09-23 ENCOUNTER — Encounter (HOSPITAL_COMMUNITY): Payer: Self-pay | Admitting: Occupational Therapy

## 2014-09-23 DIAGNOSIS — M629 Disorder of muscle, unspecified: Secondary | ICD-10-CM | POA: Diagnosis not present

## 2014-09-23 DIAGNOSIS — M25632 Stiffness of left wrist, not elsewhere classified: Secondary | ICD-10-CM | POA: Diagnosis not present

## 2014-09-23 DIAGNOSIS — M25639 Stiffness of unspecified wrist, not elsewhere classified: Secondary | ICD-10-CM

## 2014-09-23 DIAGNOSIS — M25532 Pain in left wrist: Secondary | ICD-10-CM

## 2014-09-23 DIAGNOSIS — R29898 Other symptoms and signs involving the musculoskeletal system: Secondary | ICD-10-CM | POA: Diagnosis not present

## 2014-09-23 DIAGNOSIS — M6281 Muscle weakness (generalized): Secondary | ICD-10-CM | POA: Diagnosis not present

## 2014-09-23 NOTE — Therapy (Signed)
[place] [place] [facility] [address] [place], [place], [address] Phone: [phone]   Fax:  [phone]  Occupational Therapy Treatment  Patient Details  Name: Wesley Ho MRN: [id number] Date of Birth: 01-16-1956 Referring Provider:  Valeria Batman, MD  Encounter Date: 09/23/2014      OT End of Session - 09/23/14 1350    Visit Number 6   Number of Visits 8   Date for OT Re-Evaluation 10/18/14   Authorization Type [employer] Medicare   Authorization Time Period Before 10th visit   Authorization - Visit Number 6   Authorization - Number of Visits 10   OT Start Time 1302   OT Stop Time 1346   OT Time Calculation (min) 44 min   Activity Tolerance Patient tolerated treatment well   Behavior During Therapy [facility] for tasks assessed/performed      Past Medical History  Diagnosis Date  . DVT (deep venous thrombosis) 09/28/2010  . H/O Thrombocytopenia 09/28/2010  . Chronic edema of lower extremity 09/28/2010  . Multiple lung nodules 09/28/2010  . Fatigue   . Emphysema of lung   . Bronchitis   . COPD (chronic obstructive pulmonary disease)   . Chronic diarrhea   . Left radial fracture     Past Surgical History  Procedure Laterality Date  . Cholecystectomy    . Mouth surgery      There were no vitals filed for this visit.  Visit Diagnosis:  Left wrist pain  Decreased pinch strength  Decreased grip strength of left hand  Stiffness of wrist joint, left  Decreased range of motion of wrist      Subjective Assessment - 09/23/14 1258    Subjective  S: I carried a pack of water from my porch to my kitchen yesterday.    Currently in Pain? Yes   Pain Score 1    Pain Location Wrist   Pain Orientation Left   Pain Descriptors / Indicators Aching   Pain Type Acute pain            [facility] OT Assessment - 09/23/14 1257    Assessment   Diagnosis left wrist fracture   Precautions   Precautions None          OT Treatments/Exercises  (OP) - 09/23/14 1305    Exercises   Exercises Elbow;Wrist;Hand   Additional Elbow Exercises   Hand Gripper with Large Beads 6/6 beads with gripper set at 25#   Hand Gripper with Medium Beads 13/13 beads with gripper set at 25#   Hand Gripper with Small Beads 2/17 with gripper set at 25#; 15/17 with gripper set at 22#   Fine Motor Coordination   Fine Motor Coordination Small Pegboard   Small Pegboard Pt completed small pegboard pattern using tweezers to place pegs. Pt had mod difficulty grasping and manipulating pegs with tweezers. Pt completed pattern with extended time.             OT Short Term Goals - 09/16/14 1321    OT SHORT TERM GOAL #1   Title Pt will be educated on HEP.    Time 4   Period Weeks   Status On-going   OT SHORT TERM GOAL #2   Title Pt will decrease fascial restrictions from mod to min amount.    Time 4   Period Weeks   Status Achieved   OT SHORT TERM GOAL #3   Title Pt will decrease pain to 3/10 or less during daily tasks.  Time 4   Period Weeks   Status Achieved   OT SHORT TERM GOAL #4   Title Pt will increase wrist  AROM to [facility] to increase ability to reach for small items.    Time 4   Period Weeks   Status Achieved   OT SHORT TERM GOAL #5   Title Pt will increase coordination by completing 9 hole peg test in under 45".    Time 4   Period Weeks   Status Achieved   OT SHORT TERM GOAL #6   Title Pt will increase grip strength by 10# and pinch strength by 5# to increase ability to grasp and hold small objects.    Time 4   Period Weeks   Status Achieved           OT Long Term Goals - 09/16/14 1324    OT LONG TERM GOAL #1   Title Pt will return to highest level of functioning and independence in daily activities.    Time 8   Period Weeks   Status On-going   OT LONG TERM GOAL #2   Title Pt will decrease pain to 1/10 or less during daily tasks.    Time 8   Period Weeks   Status On-going   OT LONG TERM GOAL #3   Title Pt will decrease  fascial restrictions to min amounts or less.    Time 8   Period Weeks   Status On-going   OT LONG TERM GOAL #4   Title Pt will increase AROM of left wrist to WNL to increase ability to complete daily tasks using LUE.    Time 8   Period Weeks   Status On-going   OT LONG TERM GOAL #5   Title Pt will increase grip strength by 25# and pinch strength by 8# to increase ability to hold hair dryer.    Time 8   Period Weeks   Status Achieved   OT LONG TERM GOAL #6   Title Pt will increase coordination by completing 9 hole peg test in 30" or less.    Time 8   Period Weeks   Status On-going               Plan - 09/23/14 1350    Clinical Impression Statement A: Added pegboard task using tweezers, continued with grip strengthening. Pt required extended time to complete pegboard task with tweezers, min-mod difficulty using tweezers. Pt reports he is unconsciously using his LUE more and feels that it is getting stronger.    Plan P: Add weights to AROM wrist exercises. Grooved pegboard task. Update HEP for wrist strengthening if appropriate.         Problem List Patient Active Problem List   Diagnosis Date Noted  . Hyperglycemia 07/08/2014  . COPD (chronic obstructive pulmonary disease)   . Dizziness 07/07/2014  . Generalized weakness 07/07/2014  . ETOH abuse 07/07/2014  . Macrocytic anemia 07/07/2014  . Left radial fracture 07/07/2014  . Anxiety 07/07/2014  . Diarrhea   . Acute bronchitis 03/16/2012  . Acute sinusitis 03/16/2012  . COPD exacerbation 03/16/2012  . Alcohol consumption heavy 03/16/2012  . Tobacco abuse 03/16/2012  . Macrocytosis without anemia 03/16/2012  . Leg pain, bilateral 03/16/2012  . Personal history of DVT (deep vein thrombosis) 03/16/2012  . Hyponatremia 03/16/2012  . DVT (deep venous thrombosis) 09/28/2010  . Thrombocytopenia 09/28/2010  . Chronic edema of lower extremity 09/28/2010  . Multiple lung nodules 09/28/2010  Ezra Sites, OTR/L   [phone]  09/23/2014, 1:53 PM  [place] [facility] [address] [place], [place], [address] Phone: [phone]   Fax:  [phone]

## 2014-09-30 ENCOUNTER — Encounter (HOSPITAL_COMMUNITY): Payer: Self-pay | Admitting: Occupational Therapy

## 2014-09-30 ENCOUNTER — Ambulatory Visit (HOSPITAL_COMMUNITY): Payer: Medicare Other | Admitting: Occupational Therapy

## 2014-09-30 DIAGNOSIS — M25532 Pain in left wrist: Secondary | ICD-10-CM

## 2014-09-30 DIAGNOSIS — M629 Disorder of muscle, unspecified: Secondary | ICD-10-CM | POA: Diagnosis not present

## 2014-09-30 DIAGNOSIS — M25632 Stiffness of left wrist, not elsewhere classified: Secondary | ICD-10-CM

## 2014-09-30 DIAGNOSIS — M6281 Muscle weakness (generalized): Secondary | ICD-10-CM | POA: Diagnosis not present

## 2014-09-30 DIAGNOSIS — R29898 Other symptoms and signs involving the musculoskeletal system: Secondary | ICD-10-CM | POA: Diagnosis not present

## 2014-09-30 DIAGNOSIS — M25639 Stiffness of unspecified wrist, not elsewhere classified: Secondary | ICD-10-CM

## 2014-09-30 NOTE — Therapy (Signed)
[name] [address] [place], [place], [address] Phone: [phone]   Fax:  [phone]  Occupational Therapy Treatment  Patient Details  Name: Wesley Ho MRN: [id number] Date of Birth: 13-Apr-1955 Referring Provider:  Valeria Batman, MD  Encounter Date: 09/30/2014      OT End of Session - 09/30/14 1340    Visit Number 7   Number of Visits 8   Date for OT Re-Evaluation 10/18/14   Authorization Type [employer] Medicare   Authorization Time Period Before 10th visit   Authorization - Visit Number 7   Authorization - Number of Visits 10   OT Start Time 1300   OT Stop Time 1347   OT Time Calculation (min) 47 min   Activity Tolerance Patient tolerated treatment well   Behavior During Therapy [facility] for tasks assessed/performed      Past Medical History  Diagnosis Date  . DVT (deep venous thrombosis) 09/28/2010  . H/O Thrombocytopenia 09/28/2010  . Chronic edema of lower extremity 09/28/2010  . Multiple lung nodules 09/28/2010  . Fatigue   . Emphysema of lung   . Bronchitis   . COPD (chronic obstructive pulmonary disease)   . Chronic diarrhea   . Left radial fracture     Past Surgical History  Procedure Laterality Date  . Cholecystectomy    . Mouth surgery      There were no vitals filed for this visit.  Visit Diagnosis:  Left wrist pain  Decreased pinch strength  Decreased grip strength of left hand  Stiffness of wrist joint, left  Decreased range of motion of wrist      Subjective Assessment - 09/30/14 1255    Subjective  S: I did way to much the other day, I pulled a heavy trashcan to the curb.    Currently in Pain? Yes   Pain Score 3    Pain Location Shoulder   Pain Orientation Left   Pain Descriptors / Indicators Aching   Pain Type Acute pain            [facility] OT Assessment - 09/30/14 1254    Assessment   Diagnosis left wrist fracture   Precautions   Precautions None                   OT Treatments/Exercises (OP) - 09/30/14 1303    Exercises   Exercises Elbow;Wrist;Hand   Elbow Exercises   Forearm Supination PROM;Strengthening;10 reps  1#   Forearm Pronation PROM;Strengthening;10 reps  1#   Wrist Flexion PROM;Strengthening;10 reps   Bar Weights/Barbell (Wrist Flexion) 1 lb   Wrist Extension PROM;Strengthening;10 reps   Bar Weights/Barbell (Wrist Extension) 1 lb   Weighted Stretch Over Towel Roll   Wrist Extension - Weighted Stretch 1 pound;60 seconds   Wrist Exercises   Wrist Radial Deviation Strengthening;10 reps   Bar Weights/Barbell (Radial Deviation) 1 lb   Wrist Ulnar Deviation Strengthening;10 reps   Bar Weights/Barbell (Ulnar Deviation) 1 lb   Fine Motor Coordination   Fine Motor Coordination Grooved pegs   Grooved pegs Pt completed 5 pegs using digits only, no diffculty. Pt completed remainder of grooved pegboard using tweezers to place pegs, mod difficulty. Pt completed pegboard using tweezers with extended time.                 OT Education - 09/30/14 1338    Education provided Yes   Education Details wrist strengthening exercises   Person(s) Educated Patient  Methods Explanation;Demonstration;Handout   Comprehension Verbalized understanding;Returned demonstration          OT Short Term Goals - 09/16/14 1321    OT SHORT TERM GOAL #1   Title Pt will be educated on HEP.    Time 4   Period Weeks   Status On-going   OT SHORT TERM GOAL #2   Title Pt will decrease fascial restrictions from mod to min amount.    Time 4   Period Weeks   Status Achieved   OT SHORT TERM GOAL #3   Title Pt will decrease pain to 3/10 or less during daily tasks.    Time 4   Period Weeks   Status Achieved   OT SHORT TERM GOAL #4   Title Pt will increase wrist  AROM to [facility] to increase ability to reach for small items.    Time 4   Period Weeks   Status Achieved   OT SHORT TERM GOAL #5   Title Pt will increase coordination by completing 9 hole peg test  in under 45".    Time 4   Period Weeks   Status Achieved   OT SHORT TERM GOAL #6   Title Pt will increase grip strength by 10# and pinch strength by 5# to increase ability to grasp and hold small objects.    Time 4   Period Weeks   Status Achieved           OT Long Term Goals - 09/16/14 1324    OT LONG TERM GOAL #1   Title Pt will return to highest level of functioning and independence in daily activities.    Time 8   Period Weeks   Status On-going   OT LONG TERM GOAL #2   Title Pt will decrease pain to 1/10 or less during daily tasks.    Time 8   Period Weeks   Status On-going   OT LONG TERM GOAL #3   Title Pt will decrease fascial restrictions to min amounts or less.    Time 8   Period Weeks   Status On-going   OT LONG TERM GOAL #4   Title Pt will increase AROM of left wrist to WNL to increase ability to complete daily tasks using LUE.    Time 8   Period Weeks   Status On-going   OT LONG TERM GOAL #5   Title Pt will increase grip strength by 25# and pinch strength by 8# to increase ability to hold hair dryer.    Time 8   Period Weeks   Status Achieved   OT LONG TERM GOAL #6   Title Pt will increase coordination by completing 9 hole peg test in 30" or less.    Time 8   Period Weeks   Status On-going               Plan - 09/30/14 1340    Clinical Impression Statement A: Added wrist strengthening exercises with 1# weight, grooved pegboard task using tweezers. Pt required increased time for tweezer task.  Pt reports increased soreness today from dragging his trashcan to the road yesterday using LUE. Provided pt with and educated pt on wrist strengthening HEP.    Plan P: Follow-up on wrist strengthening HEP. Increase wrist ext stretch to 2' hold. Cont working on grip & pinch strengthening.         Problem List Patient Active Problem List   Diagnosis Date Noted  . Hyperglycemia 07/08/2014  .  COPD (chronic obstructive pulmonary disease)   . Dizziness  07/07/2014  . Generalized weakness 07/07/2014  . ETOH abuse 07/07/2014  . Macrocytic anemia 07/07/2014  . Left radial fracture 07/07/2014  . Anxiety 07/07/2014  . Diarrhea   . Acute bronchitis 03/16/2012  . Acute sinusitis 03/16/2012  . COPD exacerbation 03/16/2012  . Alcohol consumption heavy 03/16/2012  . Tobacco abuse 03/16/2012  . Macrocytosis without anemia 03/16/2012  . Leg pain, bilateral 03/16/2012  . Personal history of DVT (deep vein thrombosis) 03/16/2012  . Hyponatremia 03/16/2012  . DVT (deep venous thrombosis) 09/28/2010  . Thrombocytopenia 09/28/2010  . Chronic edema of lower extremity 09/28/2010  . Multiple lung nodules 09/28/2010    Ezra Sites, OTR/L  [phone]  09/30/2014, 2:14 PM  [place] [facility] [address] [place], [place], [address] Phone: [phone]   Fax:  [phone]

## 2014-09-30 NOTE — Patient Instructions (Signed)
Strengthening Exercises  1) WRIST EXTENSION CURLS - TABLE  Hold a small free weight, rest your forearm on a table and bend your wrist up and down with your palm face down as shown.      2) WRIST FLEXION CURLS - TABLE  Hold a small free weight, rest your forearm on a table and bend your wrist up and down with your palm face up as shown.     3) FREE WEIGHT RADIAL/ULNAR DEVIATION - TABLE  Hold a small free weight, rest your forearm on a table and bend your wrist up and down with your palm facing towards the side as shown.     4) Pronation  Forearm supported on table with wrist in neutral position. Using a weight, roll wrist so that palm faces downward. Hold for 2 seconds and return to starting position.     5) Supination  Forearm supported on table with wrist in neutral position. Using a weight, roll wrist so that palm is now facing upward. Hold for 2 seconds and return to starting position.      *Complete exercises using _1___ pound weight, _10___times each, __2__times per day*

## 2014-10-07 ENCOUNTER — Encounter (HOSPITAL_COMMUNITY): Payer: Self-pay | Admitting: Occupational Therapy

## 2014-10-07 ENCOUNTER — Ambulatory Visit (HOSPITAL_COMMUNITY): Payer: Medicare Other | Attending: Orthopaedic Surgery | Admitting: Occupational Therapy

## 2014-10-07 DIAGNOSIS — M25532 Pain in left wrist: Secondary | ICD-10-CM | POA: Insufficient documentation

## 2014-10-07 DIAGNOSIS — M25639 Stiffness of unspecified wrist, not elsewhere classified: Secondary | ICD-10-CM

## 2014-10-07 DIAGNOSIS — M6281 Muscle weakness (generalized): Secondary | ICD-10-CM | POA: Diagnosis not present

## 2014-10-07 DIAGNOSIS — M25632 Stiffness of left wrist, not elsewhere classified: Secondary | ICD-10-CM | POA: Diagnosis not present

## 2014-10-07 DIAGNOSIS — R29898 Other symptoms and signs involving the musculoskeletal system: Secondary | ICD-10-CM | POA: Diagnosis not present

## 2014-10-07 DIAGNOSIS — M629 Disorder of muscle, unspecified: Secondary | ICD-10-CM

## 2014-10-07 DIAGNOSIS — M6289 Other specified disorders of muscle: Secondary | ICD-10-CM

## 2014-10-07 NOTE — Therapy (Signed)
[place], [place], [address] Phone: [phone]   Fax:  [phone]  Occupational Therapy Reassessment & Treatment  Patient Details  Name: Wesley Ho MRN: [id number] Date of Birth: 11/14/1955 Referring Provider:  Garald Balding, MD  Encounter Date: 10/07/2014      OT End of Session - 10/07/14 1527    Visit Number 7   Number of Visits 12   Date for OT Re-Evaluation 12/06/14  mini reassessment 11/05/2014    Authorization Type [employer] Medicare   Authorization Time Period Before 17th visit   Authorization - Visit Number 7   Authorization - Number of Visits 17   OT Start Time 1300   OT Stop Time 1345   OT Time Calculation (min) 45 min   Activity Tolerance Patient tolerated treatment well   Behavior During Therapy [facility] for tasks assessed/performed      Past Medical History  Diagnosis Date  . DVT (deep venous thrombosis) 09/28/2010  . H/O Thrombocytopenia 09/28/2010  . Chronic edema of lower extremity 09/28/2010  . Multiple lung nodules 09/28/2010  . Fatigue   . Emphysema of lung   . Bronchitis   . COPD (chronic obstructive pulmonary disease)   . Chronic diarrhea   . Left radial fracture     Past Surgical History  Procedure Laterality Date  . Cholecystectomy    . Mouth surgery      There were no vitals filed for this visit.  Visit Diagnosis:  Left wrist pain  Decreased pinch strength  Decreased grip strength of left hand  Stiffness of wrist joint, left  Decreased range of motion of wrist  Tight fascia      Subjective Assessment - 10/07/14 1300    Subjective  S: I've been working it out a lot lately.    Currently in Pain? Yes   Pain Score 3    Pain Location Wrist   Pain Orientation Left   Pain Descriptors / Indicators Aching   Pain Type Acute pain           [facility] OT Assessment - 10/07/14 1259    Assessment   Diagnosis left wrist fracture   Precautions   Precautions None    Coordination   Left 9 Hole Peg Test 38.51  previous 42.2"   Palpation   Palpation comment Min fascial restrictions in left dorsal hand and forearm   AROM   Overall AROM Comments Assessed seated, forearm and hand resting on table in neutral    Right/Left Forearm Left   Left Forearm Pronation 90 Degrees  same as previous   Left Forearm Supination 70 Degrees  66 previous   Right/Left Wrist Left   Left Wrist Extension 57 Degrees  56 previous   Left Wrist Flexion 50 Degrees  40 previous   Strength   Left Hand Grip (lbs) 40  same as previous   Left Hand Lateral Pinch 15 lbs  17 previous   Left Hand 3 Point Pinch 14 lbs  12 previous                  OT Treatments/Exercises (OP) - 10/07/14 1306    Exercises   Exercises Elbow;Wrist;Hand   Elbow Exercises   Forearm Supination PROM;Strengthening;10 reps  1   Forearm Pronation PROM;Strengthening;10 reps  1#   Wrist Flexion PROM;Strengthening;10 reps   Bar Weights/Barbell (Wrist Flexion) 1 lb   Wrist Extension PROM;Strengthening;10 reps   Bar Weights/Barbell (Wrist Extension) 1 lb  Additional Elbow Exercises   Hand Gripper with Large Beads 6/6 beads with gripper set at 29#   Hand Gripper with Medium Beads 13/13 beads with gripper set at 29#   Hand Gripper with Small Beads 17/17 with gripper set at 29#   Weighted Stretch Over Towel Roll   Supination - Weighted Stretch 1 pound;60 seconds   Wrist Exercises   Wrist Radial Deviation Strengthening;10 reps   Bar Weights/Barbell (Radial Deviation) 1 lb   Wrist Ulnar Deviation Strengthening;10 reps   Bar Weights/Barbell (Ulnar Deviation) 1 lb                 OT Short Term Goals - 09/16/14 1321    OT SHORT TERM GOAL #1   Title Pt will be educated on HEP.    Time 4   Period Weeks   Status On-going   OT SHORT TERM GOAL #2   Title Pt will decrease fascial restrictions from mod to min amount.    Time 4   Period Weeks   Status Achieved   OT SHORT TERM GOAL #3    Title Pt will decrease pain to 3/10 or less during daily tasks.    Time 4   Period Weeks   Status Achieved   OT SHORT TERM GOAL #4   Title Pt will increase wrist  AROM to [facility] to increase ability to reach for small items.    Time 4   Period Weeks   Status Achieved   OT SHORT TERM GOAL #5   Title Pt will increase coordination by completing 9 hole peg test in under 45".    Time 4   Period Weeks   Status Achieved   OT SHORT TERM GOAL #6   Title Pt will increase grip strength by 10# and pinch strength by 5# to increase ability to grasp and hold small objects.    Time 4   Period Weeks   Status Achieved           OT Long Term Goals - 10/07/14 1341    OT LONG TERM GOAL #1   Title Pt will return to highest level of functioning and independence in daily activities.    Time 8   Period Weeks   Status On-going   OT LONG TERM GOAL #2   Title Pt will decrease pain to 1/10 or less during daily tasks.    Time 8   Period Weeks   Status On-going   OT LONG TERM GOAL #3   Title Pt will decrease fascial restrictions to min amounts or less.    Time 8   Period Weeks   Status Achieved   OT LONG TERM GOAL #4   Title Pt will increase AROM of left wrist to WNL to increase ability to complete daily tasks using LUE.    Time 8   Period Weeks   Status On-going   OT LONG TERM GOAL #5   Title Pt will increase grip strength by 35# and pinch strength by 8# to increase ability to hold hair dryer.    Time 8   Period Weeks   Status Revised   OT LONG TERM GOAL #6   Title Pt will increase coordination by completing 9 hole peg test in 30" or less.    Time 8   Period Weeks   Status On-going               Plan - 10/07/14 1528    Clinical Impression Statement A:  Reassessment completed this date. Pt has met 4/5 STGs and 1/5 LTGs, 1 additional LTG has been revised this date. Pt is progressing towards remaining goals.  Pt reports he was able to button a dress shirt for the first time this week  using BUE. Pt reports he has minimal pain, however is continuing to take prescription pain medication once per day in the mornings. Pt is self-reportedly noncompliant in HEP.     Plan P: Pt would benefit from continued skilled OT services 1x/week for 4 additional weeks to increase ability to use LUE during functional tasks, increase wrist and grip/pinch strength, and continue to increase fine motor coordination. Next Session: Increase wrist strengthening reps to 15.           G-Codes - 25-Oct-2014 1531    Functional Assessment Tool Used Functional grip strength: R: 66 L: 40 (40% impaired)   Functional Limitation Carrying, moving and handling objects   Carrying, Moving and Handling Objects Current Status (W4665) At least 40 percent but less than 60 percent impaired, limited or restricted   Carrying, Moving and Handling Objects Goal Status (L9357) At least 20 percent but less than 40 percent impaired, limited or restricted      Problem List Patient Active Problem List   Diagnosis Date Noted  . Hyperglycemia 07/08/2014  . COPD (chronic obstructive pulmonary disease)   . Dizziness 07/07/2014  . Generalized weakness 07/07/2014  . ETOH abuse 07/07/2014  . Macrocytic anemia 07/07/2014  . Left radial fracture 07/07/2014  . Anxiety 07/07/2014  . Diarrhea   . Acute bronchitis 03/16/2012  . Acute sinusitis 03/16/2012  . COPD exacerbation 03/16/2012  . Alcohol consumption heavy 03/16/2012  . Tobacco abuse 03/16/2012  . Macrocytosis without anemia 03/16/2012  . Leg pain, bilateral 03/16/2012  . Personal history of DVT (deep vein thrombosis) 03/16/2012  . Hyponatremia 03/16/2012  . DVT (deep venous thrombosis) 09/28/2010  . Thrombocytopenia 09/28/2010  . Chronic edema of lower extremity 09/28/2010  . Multiple lung nodules 09/28/2010    Guadelupe Sabin, OTR/L  [phone]  25-Oct-2014, 3:34 PM  [place] [address] [place], [place],  [address] Phone: [phone]   Fax:  [phone]

## 2014-10-14 ENCOUNTER — Encounter (HOSPITAL_COMMUNITY): Payer: Self-pay

## 2014-10-14 ENCOUNTER — Ambulatory Visit (HOSPITAL_COMMUNITY): Payer: Medicare Other

## 2014-10-14 DIAGNOSIS — M25532 Pain in left wrist: Secondary | ICD-10-CM | POA: Diagnosis not present

## 2014-10-14 DIAGNOSIS — R29898 Other symptoms and signs involving the musculoskeletal system: Secondary | ICD-10-CM

## 2014-10-14 DIAGNOSIS — M6281 Muscle weakness (generalized): Secondary | ICD-10-CM | POA: Diagnosis not present

## 2014-10-14 DIAGNOSIS — M629 Disorder of muscle, unspecified: Secondary | ICD-10-CM | POA: Diagnosis not present

## 2014-10-14 DIAGNOSIS — M25632 Stiffness of left wrist, not elsewhere classified: Secondary | ICD-10-CM

## 2014-10-14 NOTE — Therapy (Signed)
[facility] [address] [place], [place], [address] Phone: [phone]   Fax:  [phone]  Occupational Therapy Treatment  Patient Details  Name: Wesley Ho MRN: [id number] Date of Birth: 12/22/1955 Referring Provider:  Valeria Batman, MD  Encounter Date: 10/14/2014      OT End of Session - 10/14/14 1416    Visit Number 8   Number of Visits 12   Date for OT Re-Evaluation 12/06/14  mini reassessment 11/05/2014    Authorization Type [employer] Medicare   Authorization Time Period Before 17th visit   Authorization - Visit Number 8   Authorization - Number of Visits 17   OT Start Time 1300   OT Stop Time 1347   OT Time Calculation (min) 47 min   Activity Tolerance Patient tolerated treatment well   Behavior During Therapy [facility] for tasks assessed/performed      Past Medical History  Diagnosis Date  . DVT (deep venous thrombosis) 09/28/2010  . H/O Thrombocytopenia 09/28/2010  . Chronic edema of lower extremity 09/28/2010  . Multiple lung nodules 09/28/2010  . Fatigue   . Emphysema of lung   . Bronchitis   . COPD (chronic obstructive pulmonary disease)   . Chronic diarrhea   . Left radial fracture     Past Surgical History  Procedure Laterality Date  . Cholecystectomy    . Mouth surgery      There were no vitals filed for this visit.  Visit Diagnosis:  Decreased pinch strength  Left wrist pain  Decreased grip strength of left hand  Stiffness of wrist joint, left      Subjective Assessment - 10/14/14 1308    Subjective  S: I had a boo boo. I burned my finger on the pizza pan.    Currently in Pain? Yes   Pain Score 2    Pain Location Wrist   Pain Orientation Left   Pain Descriptors / Indicators Aching   Pain Type Acute pain                      OT Treatments/Exercises (OP) - 10/14/14 1310    Exercises   Exercises Elbow;Wrist;Hand   Elbow Exercises   Forearm Supination Strengthening;15 reps  2#    Forearm Pronation Strengthening;15 reps  2#   Wrist Flexion Strengthening;15 reps   Bar Weights/Barbell (Wrist Flexion) 2 lbs   Wrist Extension Strengthening;15 reps   Bar Weights/Barbell (Wrist Extension) 2 lbs   Additional Elbow Exercises   Hand Gripper with Large Beads 6/6 beads with gripper set at 29#   Hand Gripper with Medium Beads 13/13 beads with gripper set at 29#   Hand Gripper with Small Beads 17/17 with gripper set at 29#   Wrist Exercises   Wrist Radial Deviation Strengthening;15 reps   Bar Weights/Barbell (Radial Deviation) 2 lbs   Wrist Ulnar Deviation Strengthening;15 reps   Bar Weights/Barbell (Ulnar Deviation) 2 lbs   Hand Exercises   Other Hand Exercises Resistive clothespins; all colors used; 3 point pinch to place; lateral pinch to remove.   Other Hand Exercises Firm Eggsercizer; fingertip pinch 10X                  OT Short Term Goals - 10/14/14 1329    OT SHORT TERM GOAL #1   Title Pt will be educated on HEP.    Time 4   Period Weeks   Status On-going   OT SHORT TERM GOAL #  2   Title Pt will decrease fascial restrictions from mod to min amount.    Time 4   Period Weeks   OT SHORT TERM GOAL #3   Title Pt will decrease pain to 3/10 or less during daily tasks.    Time 4   Period Weeks   OT SHORT TERM GOAL #4   Title Pt will increase wrist  AROM to [facility] to increase ability to reach for small items.    Time 4   Period Weeks   OT SHORT TERM GOAL #5   Title Pt will increase coordination by completing 9 hole peg test in under 45".    Time 4   Period Weeks   OT SHORT TERM GOAL #6   Title Pt will increase grip strength by 10# and pinch strength by 5# to increase ability to grasp and hold small objects.    Time 4   Period Weeks   Status Achieved           OT Long Term Goals - 10/14/14 1329    OT LONG TERM GOAL #1   Title Pt will return to highest level of functioning and independence in daily activities.    Time 8   Period Weeks    Status On-going   OT LONG TERM GOAL #2   Title Pt will decrease pain to 1/10 or less during daily tasks.    Time 8   Period Weeks   Status On-going   OT LONG TERM GOAL #3   Title Pt will decrease fascial restrictions to min amounts or less.    Time 8   Period Weeks   OT LONG TERM GOAL #4   Title Pt will increase AROM of left wrist to WNL to increase ability to complete daily tasks using LUE.    Time 8   Period Weeks   Status On-going   OT LONG TERM GOAL #5   Title Pt will increase grip strength by 35# and pinch strength by 8# to increase ability to hold hair dryer.    Time 8   Period Weeks   Status Revised   OT LONG TERM GOAL #6   Title Pt will increase coordination by completing 9 hole peg test in 30" or less.    Time 8   Period Weeks   Status On-going               Plan - 10/14/14 1416    Clinical Impression Statement A: Focused on grip and pinch strengthening this session. Patient was able to complete all activities with rest breaks when needed.    Plan P: Attempt to increase handgripper resistance. Cont with coordination activity (grooved pegboard with tweezers)        Problem List Patient Active Problem List   Diagnosis Date Noted  . Hyperglycemia 07/08/2014  . COPD (chronic obstructive pulmonary disease)   . Dizziness 07/07/2014  . Generalized weakness 07/07/2014  . ETOH abuse 07/07/2014  . Macrocytic anemia 07/07/2014  . Left radial fracture 07/07/2014  . Anxiety 07/07/2014  . Diarrhea   . Acute bronchitis 03/16/2012  . Acute sinusitis 03/16/2012  . COPD exacerbation 03/16/2012  . Alcohol consumption heavy 03/16/2012  . Tobacco abuse 03/16/2012  . Macrocytosis without anemia 03/16/2012  . Leg pain, bilateral 03/16/2012  . Personal history of DVT (deep vein thrombosis) 03/16/2012  . Hyponatremia 03/16/2012  . DVT (deep venous thrombosis) 09/28/2010  . Thrombocytopenia 09/28/2010  . Chronic edema of lower extremity 09/28/2010  .  Multiple lung  nodules 09/28/2010   Limmie Patricia, OTR/L,CBIS  [phone]  10/14/2014, 2:24 PM  [facility] [address] [place], [place], [address] Phone: [phone]   Fax:  [phone]

## 2014-10-21 ENCOUNTER — Encounter (HOSPITAL_COMMUNITY): Payer: Self-pay | Admitting: Occupational Therapy

## 2014-10-21 ENCOUNTER — Ambulatory Visit (HOSPITAL_COMMUNITY): Payer: Medicare Other | Admitting: Occupational Therapy

## 2014-10-21 DIAGNOSIS — M25632 Stiffness of left wrist, not elsewhere classified: Secondary | ICD-10-CM | POA: Diagnosis not present

## 2014-10-21 DIAGNOSIS — M25532 Pain in left wrist: Secondary | ICD-10-CM | POA: Diagnosis not present

## 2014-10-21 DIAGNOSIS — R29898 Other symptoms and signs involving the musculoskeletal system: Secondary | ICD-10-CM | POA: Diagnosis not present

## 2014-10-21 DIAGNOSIS — M6281 Muscle weakness (generalized): Secondary | ICD-10-CM | POA: Diagnosis not present

## 2014-10-21 DIAGNOSIS — M629 Disorder of muscle, unspecified: Secondary | ICD-10-CM | POA: Diagnosis not present

## 2014-10-21 DIAGNOSIS — M25639 Stiffness of unspecified wrist, not elsewhere classified: Secondary | ICD-10-CM

## 2014-10-21 NOTE — Therapy (Signed)
[facility] [address] [place], [place], [address] Phone: [phone]   Fax:  [phone]  Occupational Therapy Treatment  Patient Details  Name: Wesley Ho MRN: [id number] Date of Birth: Jun 09, 1955 Referring Provider:  Valeria Batman, MD  Encounter Date: 10/21/2014      OT End of Session - 10/21/14 1355    Visit Number 9   Number of Visits 12   Date for OT Re-Evaluation 12/06/14  mini reassessment 11/05/2014    Authorization Type [employer] Medicare   Authorization Time Period Before 17th visit   Authorization - Visit Number 9   Authorization - Number of Visits 17   OT Start Time 1300   OT Stop Time 1350   OT Time Calculation (min) 50 min   Activity Tolerance Patient tolerated treatment well   Behavior During Therapy [facility] for tasks assessed/performed      Past Medical History  Diagnosis Date  . DVT (deep venous thrombosis) 09/28/2010  . H/O Thrombocytopenia 09/28/2010  . Chronic edema of lower extremity 09/28/2010  . Multiple lung nodules 09/28/2010  . Fatigue   . Emphysema of lung   . Bronchitis   . COPD (chronic obstructive pulmonary disease)   . Chronic diarrhea   . Left radial fracture     Past Surgical History  Procedure Laterality Date  . Cholecystectomy    . Mouth surgery      There were no vitals filed for this visit.  Visit Diagnosis:  Decreased pinch strength  Left wrist pain  Decreased grip strength of left hand  Stiffness of wrist joint, left  Decreased range of motion of wrist      Subjective Assessment - 10/21/14 1249    Subjective  S: I've pushed my wrist this week.    Currently in Pain? Yes   Pain Score 4    Pain Location Wrist   Pain Orientation Left   Pain Descriptors / Indicators Aching   Pain Type Acute pain            [facility] OT Assessment - 10/21/14 1248    Assessment   Diagnosis left wrist fracture   Precautions   Precautions None                  OT  Treatments/Exercises (OP) - 10/21/14 1303    Exercises   Exercises Elbow;Wrist;Hand   Additional Elbow Exercises   Hand Gripper with Large Beads 6/6 beads with gripper set at 35#   Hand Gripper with Medium Beads 13/13 beads with gripper set at 35#   Hand Gripper with Small Beads 17/17 with gripper set at 35#   Fine Motor Coordination   Fine Motor Coordination Grooved pegs   Grooved pegs Pt completed grooved pegboard task with left hand using tweezers in a 3 point pinch grasp. Pt had mod difficulty with task, requiring increased time to complete board. Pt focused on fine motor coordination while incorporating wrist flexion and extension to manipulate pegs into pegboard holes. Pt hand became fatigued at end of session, used left hand digits to place final 4 pegs.                    OT Short Term Goals - 10/14/14 1329    OT SHORT TERM GOAL #1   Title Pt will be educated on HEP.    Time 4   Period Weeks   Status On-going   OT SHORT TERM GOAL #2   Title  Pt will decrease fascial restrictions from mod to min amount.    Time 4   Period Weeks   OT SHORT TERM GOAL #3   Title Pt will decrease pain to 3/10 or less during daily tasks.    Time 4   Period Weeks   OT SHORT TERM GOAL #4   Title Pt will increase wrist  AROM to [facility] to increase ability to reach for small items.    Time 4   Period Weeks   OT SHORT TERM GOAL #5   Title Pt will increase coordination by completing 9 hole peg test in under 45".    Time 4   Period Weeks   OT SHORT TERM GOAL #6   Title Pt will increase grip strength by 10# and pinch strength by 5# to increase ability to grasp and hold small objects.    Time 4   Period Weeks   Status Achieved           OT Long Term Goals - 10/14/14 1329    OT LONG TERM GOAL #1   Title Pt will return to highest level of functioning and independence in daily activities.    Time 8   Period Weeks   Status On-going   OT LONG TERM GOAL #2   Title Pt will decrease pain to  1/10 or less during daily tasks.    Time 8   Period Weeks   Status On-going   OT LONG TERM GOAL #3   Title Pt will decrease fascial restrictions to min amounts or less.    Time 8   Period Weeks   OT LONG TERM GOAL #4   Title Pt will increase AROM of left wrist to WNL to increase ability to complete daily tasks using LUE.    Time 8   Period Weeks   Status On-going   OT LONG TERM GOAL #5   Title Pt will increase grip strength by 35# and pinch strength by 8# to increase ability to hold hair dryer.    Time 8   Period Weeks   Status Revised   OT LONG TERM GOAL #6   Title Pt will increase coordination by completing 9 hole peg test in 30" or less.    Time 8   Period Weeks   Status On-going               Plan - 10/21/14 1355    Clinical Impression Statement A: Increased hand gripper to 35# of resistance this session, pt took 1 rest break during medium beads, 2 rest breaks during small beads. Pt completed grooved pegboard activity using tweezers, mod difficulty, intermittant rest breaks. Pt reports fatigue in left hand at end of session, however states that wrist feels looser than when he arrived.    Plan P: Attempt to increase handgripper for large & medium beads. Domino activity to work on fine motor coordination.         Problem List Patient Active Problem List   Diagnosis Date Noted  . Hyperglycemia 07/08/2014  . COPD (chronic obstructive pulmonary disease)   . Dizziness 07/07/2014  . Generalized weakness 07/07/2014  . ETOH abuse 07/07/2014  . Macrocytic anemia 07/07/2014  . Left radial fracture 07/07/2014  . Anxiety 07/07/2014  . Diarrhea   . Acute bronchitis 03/16/2012  . Acute sinusitis 03/16/2012  . COPD exacerbation 03/16/2012  . Alcohol consumption heavy 03/16/2012  . Tobacco abuse 03/16/2012  . Macrocytosis without anemia 03/16/2012  . Leg pain,  bilateral 03/16/2012  . Personal history of DVT (deep vein thrombosis) 03/16/2012  . Hyponatremia 03/16/2012   . DVT (deep venous thrombosis) 09/28/2010  . Thrombocytopenia 09/28/2010  . Chronic edema of lower extremity 09/28/2010  . Multiple lung nodules 09/28/2010    Ezra Sites, OTR/L  [phone]  10/21/2014, 1:58 PM  [facility] [address] [place], [place], [address] Phone: [phone]   Fax:  [phone]

## 2014-10-27 DIAGNOSIS — Z6834 Body mass index (BMI) 34.0-34.9, adult: Secondary | ICD-10-CM | POA: Diagnosis not present

## 2014-10-27 DIAGNOSIS — R7309 Other abnormal glucose: Secondary | ICD-10-CM | POA: Diagnosis not present

## 2014-10-27 DIAGNOSIS — Z1389 Encounter for screening for other disorder: Secondary | ICD-10-CM | POA: Diagnosis not present

## 2014-10-27 DIAGNOSIS — N529 Male erectile dysfunction, unspecified: Secondary | ICD-10-CM | POA: Diagnosis not present

## 2014-10-27 DIAGNOSIS — G894 Chronic pain syndrome: Secondary | ICD-10-CM | POA: Diagnosis not present

## 2014-10-27 DIAGNOSIS — Z23 Encounter for immunization: Secondary | ICD-10-CM | POA: Diagnosis not present

## 2014-10-27 DIAGNOSIS — I10 Essential (primary) hypertension: Secondary | ICD-10-CM | POA: Diagnosis not present

## 2014-10-28 ENCOUNTER — Ambulatory Visit (HOSPITAL_COMMUNITY): Payer: Medicare Other

## 2014-10-28 ENCOUNTER — Encounter (HOSPITAL_COMMUNITY): Payer: Self-pay

## 2014-10-28 DIAGNOSIS — R29898 Other symptoms and signs involving the musculoskeletal system: Secondary | ICD-10-CM

## 2014-10-28 DIAGNOSIS — M25532 Pain in left wrist: Secondary | ICD-10-CM | POA: Diagnosis not present

## 2014-10-28 DIAGNOSIS — M629 Disorder of muscle, unspecified: Secondary | ICD-10-CM | POA: Diagnosis not present

## 2014-10-28 DIAGNOSIS — M25632 Stiffness of left wrist, not elsewhere classified: Secondary | ICD-10-CM | POA: Diagnosis not present

## 2014-10-28 DIAGNOSIS — M6281 Muscle weakness (generalized): Secondary | ICD-10-CM | POA: Diagnosis not present

## 2014-10-28 NOTE — Therapy (Signed)
[facility] [address] [place], [place], [address] Phone: [phone]   Fax:  [phone]  Occupational Therapy Treatment  Patient Details  Name: Wesley Ho MRN: [id number] Date of Birth: 12-Dec-1955 Referring Provider:  Valeria Batman, MD  Encounter Date: 10/28/2014      OT End of Session - 10/28/14 1410    Visit Number 10   Number of Visits 12   Date for OT Re-Evaluation 12/06/14  mini reassessment 11/05/2014    Authorization Type [employer] Medicare   Authorization Time Period Before 17th visit   Authorization - Visit Number 10   Authorization - Number of Visits 17   OT Start Time 1305   OT Stop Time 1345   OT Time Calculation (min) 40 min   Activity Tolerance Patient tolerated treatment well   Behavior During Therapy [facility] for tasks assessed/performed      Past Medical History  Diagnosis Date  . DVT (deep venous thrombosis) 09/28/2010  . H/O Thrombocytopenia 09/28/2010  . Chronic edema of lower extremity 09/28/2010  . Multiple lung nodules 09/28/2010  . Fatigue   . Emphysema of lung   . Bronchitis   . COPD (chronic obstructive pulmonary disease)   . Chronic diarrhea   . Left radial fracture     Past Surgical History  Procedure Laterality Date  . Cholecystectomy    . Mouth surgery      There were no vitals filed for this visit.  Visit Diagnosis:  Decreased pinch strength  Decreased grip strength of left hand      Subjective Assessment - 10/28/14 1314    Subjective  S: I'm using his hand for all of my daily activities now. I make a point of using for everything.    Currently in Pain? Yes   Pain Score 2    Pain Location Wrist   Pain Orientation Left   Pain Descriptors / Indicators Aching   Pain Type Acute pain            [facility] OT Assessment - 10/28/14 1316    Assessment   Diagnosis left wrist fracture   Precautions   Precautions None                  OT Treatments/Exercises (OP) -  10/28/14 1316    Exercises   Exercises Elbow;Wrist;Hand   Additional Elbow Exercises   Hand Gripper with Large Beads 6/6 beads with gripper set at 37#   Hand Gripper with Medium Beads 13/13 beads with gripper set at 37#   Hand Gripper with Small Beads 1/17 beads with gripper set at 35#   Fine Motor Coordination   Other Fine Motor Exercises Pt completed Domino activity for fine motor activity. Pt stacked Dominos in straight lines in order to topple. Pt knocked down Domino one time due to decrease coordination. Otherwise patient has min difficulty .                  OT Short Term Goals - 10/14/14 1329    OT SHORT TERM GOAL #1   Title Pt will be educated on HEP.    Time 4   Period Weeks   Status On-going   OT SHORT TERM GOAL #2   Title Pt will decrease fascial restrictions from mod to min amount.    Time 4   Period Weeks   OT SHORT TERM GOAL #3   Title Pt will decrease pain to 3/10 or less during daily  tasks.    Time 4   Period Weeks   OT SHORT TERM GOAL #4   Title Pt will increase wrist  AROM to [facility] to increase ability to reach for small items.    Time 4   Period Weeks   OT SHORT TERM GOAL #5   Title Pt will increase coordination by completing 9 hole peg test in under 45".    Time 4   Period Weeks   OT SHORT TERM GOAL #6   Title Pt will increase grip strength by 10# and pinch strength by 5# to increase ability to grasp and hold small objects.    Time 4   Period Weeks   Status Achieved           OT Long Term Goals - 10/14/14 1329    OT LONG TERM GOAL #1   Title Pt will return to highest level of functioning and independence in daily activities.    Time 8   Period Weeks   Status On-going   OT LONG TERM GOAL #2   Title Pt will decrease pain to 1/10 or less during daily tasks.    Time 8   Period Weeks   Status On-going   OT LONG TERM GOAL #3   Title Pt will decrease fascial restrictions to min amounts or less.    Time 8   Period Weeks   OT LONG TERM  GOAL #4   Title Pt will increase AROM of left wrist to WNL to increase ability to complete daily tasks using LUE.    Time 8   Period Weeks   Status On-going   OT LONG TERM GOAL #5   Title Pt will increase grip strength by 35# and pinch strength by 8# to increase ability to hold hair dryer.    Time 8   Period Weeks   Status Revised   OT LONG TERM GOAL #6   Title Pt will increase coordination by completing 9 hole peg test in 30" or less.    Time 8   Period Weeks   Status On-going               Plan - 10/28/14 1411    Clinical Impression Statement A: Pt was able to tolerate increase in handgripper to 37# to pick up large and medium beads. Pt attempted to pick up small beads with gripper set at 35#. Pt was only able to pick up one due to hand fatigue.   Plan P: Reassessment        Problem List Patient Active Problem List   Diagnosis Date Noted  . Hyperglycemia 07/08/2014  . COPD (chronic obstructive pulmonary disease)   . Dizziness 07/07/2014  . Generalized weakness 07/07/2014  . ETOH abuse 07/07/2014  . Macrocytic anemia 07/07/2014  . Left radial fracture 07/07/2014  . Anxiety 07/07/2014  . Diarrhea   . Acute bronchitis 03/16/2012  . Acute sinusitis 03/16/2012  . COPD exacerbation 03/16/2012  . Alcohol consumption heavy 03/16/2012  . Tobacco abuse 03/16/2012  . Macrocytosis without anemia 03/16/2012  . Leg pain, bilateral 03/16/2012  . Personal history of DVT (deep vein thrombosis) 03/16/2012  . Hyponatremia 03/16/2012  . DVT (deep venous thrombosis) 09/28/2010  . Thrombocytopenia 09/28/2010  . Chronic edema of lower extremity 09/28/2010  . Multiple lung nodules 09/28/2010    Limmie Patricia, OTR/L,CBIS  [phone]  10/28/2014, 2:13 PM  [facility] [address] [place], [place], [address] Phone: [phone]  Fax:  [phone]

## 2014-11-04 ENCOUNTER — Ambulatory Visit (HOSPITAL_COMMUNITY): Payer: Medicare Other | Attending: Orthopaedic Surgery | Admitting: Occupational Therapy

## 2014-11-04 DIAGNOSIS — M25532 Pain in left wrist: Secondary | ICD-10-CM | POA: Diagnosis not present

## 2014-11-04 DIAGNOSIS — R29898 Other symptoms and signs involving the musculoskeletal system: Secondary | ICD-10-CM | POA: Diagnosis not present

## 2014-11-04 DIAGNOSIS — M25632 Stiffness of left wrist, not elsewhere classified: Secondary | ICD-10-CM | POA: Insufficient documentation

## 2014-11-04 DIAGNOSIS — M6281 Muscle weakness (generalized): Secondary | ICD-10-CM | POA: Insufficient documentation

## 2014-11-04 NOTE — Therapy (Signed)
[facility] [address] [place], [place], [address] Phone: [phone]   Fax:  [phone]  Occupational Therapy Reassessment & Treatment  Patient Details  Name: Wesley Ho MRN: [id number] Date of Birth: 1955-08-15 Referring Provider:  Valeria Batman, MD  Encounter Date: 11/04/2014      OT End of Session - 11/04/14 1705    Visit Number 11   Number of Visits 12   Date for OT Re-Evaluation 12/06/14  mini reassessment 11/05/2014    Authorization Type [employer]   Authorization Time Period Before 17th visit   Authorization - Visit Number 11   Authorization - Number of Visits 17   OT Start Time 1301   OT Stop Time 1349   OT Time Calculation (min) 48 min   Activity Tolerance Patient tolerated treatment well   Behavior During Therapy [facility] for tasks assessed/performed      Past Medical History  Diagnosis Date  . DVT (deep venous thrombosis) 09/28/2010  . H/O Thrombocytopenia 09/28/2010  . Chronic edema of lower extremity 09/28/2010  . Multiple lung nodules 09/28/2010  . Fatigue   . Emphysema of lung   . Bronchitis   . COPD (chronic obstructive pulmonary disease)   . Chronic diarrhea   . Left radial fracture     Past Surgical History  Procedure Laterality Date  . Cholecystectomy    . Mouth surgery      There were no vitals filed for this visit.  Visit Diagnosis:  Decreased pinch strength  Decreased grip strength of left hand  Left wrist pain  Stiffness of wrist joint, left      Subjective Assessment - 11/04/14 1303    Subjective  S: Today is my last day with you all.    Special Tests FOTO Score: 78/100 (22% impairment)   Currently in Pain? Yes   Pain Score 2    Pain Location Wrist   Pain Orientation Left   Pain Descriptors / Indicators Aching   Pain Type Acute pain           [facility] OT Assessment - 11/04/14 1304    Assessment   Diagnosis left wrist fracture   Precautions   Precautions None   Coordination   Left 9 Hole Peg Test 35.00"  previous 38.51   AROM   Overall AROM Comments Assessed seated, forearm and hand resting on table in neutral    Right/Left Forearm Left   Left Forearm Pronation 90 Degrees  same as previous   Left Forearm Supination 84 Degrees  70 previous   Right/Left Wrist Left   Left Wrist Extension 60 Degrees  57 previous   Left Wrist Flexion 52 Degrees  50 previous   Strength   Left Hand Grip (lbs) 54  40 previous   Left Hand Lateral Pinch 18 lbs  15 previous   Left Hand 3 Point Pinch 16 lbs  14 previous                  OT Treatments/Exercises (OP) - 11/04/14 1326    Exercises   Exercises Elbow;Wrist;Hand   Additional Elbow Exercises   Hand Gripper with Large Beads 6/6 beads with gripper set at 37#   Hand Gripper with Medium Beads 13/13 beads with gripper set at 37#   Hand Gripper with Small Beads 17/17 beads wtih gripper set at 37#               OT Education - 11/04/14 1704  Education provided Yes   Education Details educated on benefit of completing previously provided exercises to continue strenghtening wrist    Person(s) Educated Patient   Methods Explanation   Comprehension Verbalized understanding          OT Short Term Goals - 11/21/14 1316    OT SHORT TERM GOAL #1   Title Pt will be educated on HEP.    Time 4   Period Weeks   Status Achieved   OT SHORT TERM GOAL #2   Title Pt will decrease fascial restrictions from mod to min amount.    Time 4   Period Weeks   OT SHORT TERM GOAL #3   Title Pt will decrease pain to 3/10 or less during daily tasks.    Time 4   Period Weeks   OT SHORT TERM GOAL #4   Title Pt will increase wrist  AROM to [facility] to increase ability to reach for small items.    Time 4   Period Weeks   OT SHORT TERM GOAL #5   Title Pt will increase coordination by completing 9 hole peg test in under 45".    Time 4   Period Weeks   OT SHORT TERM GOAL #6   Title Pt will increase grip  strength by 10# and pinch strength by 5# to increase ability to grasp and hold small objects.    Time 4   Period Weeks   Status Achieved           OT Long Term Goals - 11/21/2014 1313    OT LONG TERM GOAL #1   Title Pt will return to highest level of functioning and independence in daily activities.    Time 8   Period Weeks   Status Achieved   OT LONG TERM GOAL #2   Title Pt will decrease pain to 1/10 or less during daily tasks.    Time 8   Period Weeks   Status Achieved   OT LONG TERM GOAL #3   Title Pt will decrease fascial restrictions to min amounts or less.    Time 8   Period Weeks   OT LONG TERM GOAL #4   Title Pt will increase AROM of left wrist to WNL to increase ability to complete daily tasks using LUE.    Time 8   Period Weeks   Status Not Met   OT LONG TERM GOAL #5   Title Pt will increase grip strength by 35# and pinch strength by 8# to increase ability to hold hair dryer.    Time 8   Period Weeks   Status Achieved   OT LONG TERM GOAL #6   Title Pt will increase coordination by completing 9 hole peg test in 30" or less.    Time 8   Period Weeks   Status Not Met               Plan - 21-Nov-2014 1706    Clinical Impression Statement A: Reassessment completed this session, pt has met all STGs and 4/6 LTGs. Pt has progressed well and is pleased with the progress he has made during therapy. Pt is self-reportedly noncompliant with HEP, however is able to complete all B/IADL tasks at his prior level of independence. Pt is agreeable to discharge.    Plan P: Discharge pt          G-Codes - Nov 21, 2014 1709    Functional Assessment Tool Used FOTO Score: 78/100 (22% impairment)  Functional Limitation Carrying, moving and handling objects   Carrying, Moving and Handling Objects Goal Status [id number]) At least 20 percent but less than 40 percent impaired, limited or restricted   Carrying, Moving and Handling Objects Discharge Status [phone]) At least 20 percent but  less than 40 percent impaired, limited or restricted      Problem List Patient Active Problem List   Diagnosis Date Noted  . Hyperglycemia 07/08/2014  . COPD (chronic obstructive pulmonary disease)   . Dizziness 07/07/2014  . Generalized weakness 07/07/2014  . ETOH abuse 07/07/2014  . Macrocytic anemia 07/07/2014  . Left radial fracture 07/07/2014  . Anxiety 07/07/2014  . Diarrhea   . Acute bronchitis 03/16/2012  . Acute sinusitis 03/16/2012  . COPD exacerbation 03/16/2012  . Alcohol consumption heavy 03/16/2012  . Tobacco abuse 03/16/2012  . Macrocytosis without anemia 03/16/2012  . Leg pain, bilateral 03/16/2012  . Personal history of DVT (deep vein thrombosis) 03/16/2012  . Hyponatremia 03/16/2012  . DVT (deep venous thrombosis) 09/28/2010  . Thrombocytopenia 09/28/2010  . Chronic edema of lower extremity 09/28/2010  . Multiple lung nodules 09/28/2010    Guadelupe Sabin, OTR/L  [phone]  11/04/2014, 5:09 PM  [place], [place], [address] Phone: [phone]   Fax:  [phone]   OCCUPATIONAL THERAPY DISCHARGE SUMMARY  Visits from Start of Care: 11  Current functional level related to goals / functional outcomes: See above. Pt reports is now able to complete all B/IADL tasks at his prior level of independence. Pt reports he is now able to complete household tasks such as steam cleaning his floors with minimal to no pain.    Remaining deficits: Pt continues to have minimal pain at times and AROM is functional, however is not WNL.    Education / Equipment: Pt has been provided with wrist and grip/pinch strengthening exercises, however is self-reportedly non-compliant in HEP. Pt was educated on the benefit of continuing to complete provided exercises to continue strengthening his wrist and grip.   Plan: Patient agrees to discharge.  Patient goals were partially met. Patient is being discharged due  to being pleased with the current functional level.  ?????

## 2015-02-09 DIAGNOSIS — N62 Hypertrophy of breast: Secondary | ICD-10-CM | POA: Diagnosis not present

## 2015-02-09 DIAGNOSIS — Z1389 Encounter for screening for other disorder: Secondary | ICD-10-CM | POA: Diagnosis not present

## 2015-02-09 DIAGNOSIS — N529 Male erectile dysfunction, unspecified: Secondary | ICD-10-CM | POA: Diagnosis not present

## 2015-02-09 DIAGNOSIS — F419 Anxiety disorder, unspecified: Secondary | ICD-10-CM | POA: Diagnosis not present

## 2015-02-09 DIAGNOSIS — Z6829 Body mass index (BMI) 29.0-29.9, adult: Secondary | ICD-10-CM | POA: Diagnosis not present

## 2015-05-23 DIAGNOSIS — I10 Essential (primary) hypertension: Secondary | ICD-10-CM | POA: Diagnosis not present

## 2015-05-23 DIAGNOSIS — R600 Localized edema: Secondary | ICD-10-CM | POA: Diagnosis not present

## 2015-06-14 DIAGNOSIS — M9901 Segmental and somatic dysfunction of cervical region: Secondary | ICD-10-CM | POA: Diagnosis not present

## 2015-06-14 DIAGNOSIS — M546 Pain in thoracic spine: Secondary | ICD-10-CM | POA: Diagnosis not present

## 2015-06-14 DIAGNOSIS — M542 Cervicalgia: Secondary | ICD-10-CM | POA: Diagnosis not present

## 2015-06-14 DIAGNOSIS — M9902 Segmental and somatic dysfunction of thoracic region: Secondary | ICD-10-CM | POA: Diagnosis not present

## 2015-06-16 DIAGNOSIS — M9902 Segmental and somatic dysfunction of thoracic region: Secondary | ICD-10-CM | POA: Diagnosis not present

## 2015-06-16 DIAGNOSIS — M542 Cervicalgia: Secondary | ICD-10-CM | POA: Diagnosis not present

## 2015-06-16 DIAGNOSIS — M9901 Segmental and somatic dysfunction of cervical region: Secondary | ICD-10-CM | POA: Diagnosis not present

## 2015-06-16 DIAGNOSIS — M546 Pain in thoracic spine: Secondary | ICD-10-CM | POA: Diagnosis not present

## 2015-06-20 DIAGNOSIS — M546 Pain in thoracic spine: Secondary | ICD-10-CM | POA: Diagnosis not present

## 2015-06-20 DIAGNOSIS — M542 Cervicalgia: Secondary | ICD-10-CM | POA: Diagnosis not present

## 2015-06-20 DIAGNOSIS — M9902 Segmental and somatic dysfunction of thoracic region: Secondary | ICD-10-CM | POA: Diagnosis not present

## 2015-06-20 DIAGNOSIS — M9901 Segmental and somatic dysfunction of cervical region: Secondary | ICD-10-CM | POA: Diagnosis not present

## 2015-06-22 DIAGNOSIS — M9901 Segmental and somatic dysfunction of cervical region: Secondary | ICD-10-CM | POA: Diagnosis not present

## 2015-06-22 DIAGNOSIS — M9902 Segmental and somatic dysfunction of thoracic region: Secondary | ICD-10-CM | POA: Diagnosis not present

## 2015-06-22 DIAGNOSIS — M546 Pain in thoracic spine: Secondary | ICD-10-CM | POA: Diagnosis not present

## 2015-06-22 DIAGNOSIS — M542 Cervicalgia: Secondary | ICD-10-CM | POA: Diagnosis not present

## 2015-06-27 DIAGNOSIS — M542 Cervicalgia: Secondary | ICD-10-CM | POA: Diagnosis not present

## 2015-06-27 DIAGNOSIS — M9901 Segmental and somatic dysfunction of cervical region: Secondary | ICD-10-CM | POA: Diagnosis not present

## 2015-06-27 DIAGNOSIS — M9902 Segmental and somatic dysfunction of thoracic region: Secondary | ICD-10-CM | POA: Diagnosis not present

## 2015-06-27 DIAGNOSIS — M546 Pain in thoracic spine: Secondary | ICD-10-CM | POA: Diagnosis not present

## 2015-06-30 DIAGNOSIS — M546 Pain in thoracic spine: Secondary | ICD-10-CM | POA: Diagnosis not present

## 2015-06-30 DIAGNOSIS — M542 Cervicalgia: Secondary | ICD-10-CM | POA: Diagnosis not present

## 2015-06-30 DIAGNOSIS — M9902 Segmental and somatic dysfunction of thoracic region: Secondary | ICD-10-CM | POA: Diagnosis not present

## 2015-06-30 DIAGNOSIS — M9901 Segmental and somatic dysfunction of cervical region: Secondary | ICD-10-CM | POA: Diagnosis not present

## 2015-07-07 DIAGNOSIS — M9901 Segmental and somatic dysfunction of cervical region: Secondary | ICD-10-CM | POA: Diagnosis not present

## 2015-07-07 DIAGNOSIS — M546 Pain in thoracic spine: Secondary | ICD-10-CM | POA: Diagnosis not present

## 2015-07-07 DIAGNOSIS — M542 Cervicalgia: Secondary | ICD-10-CM | POA: Diagnosis not present

## 2015-07-07 DIAGNOSIS — M9902 Segmental and somatic dysfunction of thoracic region: Secondary | ICD-10-CM | POA: Diagnosis not present

## 2015-07-13 DIAGNOSIS — M546 Pain in thoracic spine: Secondary | ICD-10-CM | POA: Diagnosis not present

## 2015-07-13 DIAGNOSIS — M542 Cervicalgia: Secondary | ICD-10-CM | POA: Diagnosis not present

## 2015-07-13 DIAGNOSIS — M9901 Segmental and somatic dysfunction of cervical region: Secondary | ICD-10-CM | POA: Diagnosis not present

## 2015-07-13 DIAGNOSIS — M9902 Segmental and somatic dysfunction of thoracic region: Secondary | ICD-10-CM | POA: Diagnosis not present

## 2015-07-15 DIAGNOSIS — Z1389 Encounter for screening for other disorder: Secondary | ICD-10-CM | POA: Diagnosis not present

## 2015-07-15 DIAGNOSIS — G629 Polyneuropathy, unspecified: Secondary | ICD-10-CM | POA: Diagnosis not present

## 2015-07-15 DIAGNOSIS — E291 Testicular hypofunction: Secondary | ICD-10-CM | POA: Diagnosis not present

## 2015-07-20 DIAGNOSIS — M542 Cervicalgia: Secondary | ICD-10-CM | POA: Diagnosis not present

## 2015-07-20 DIAGNOSIS — M9901 Segmental and somatic dysfunction of cervical region: Secondary | ICD-10-CM | POA: Diagnosis not present

## 2015-07-20 DIAGNOSIS — M546 Pain in thoracic spine: Secondary | ICD-10-CM | POA: Diagnosis not present

## 2015-07-20 DIAGNOSIS — M9902 Segmental and somatic dysfunction of thoracic region: Secondary | ICD-10-CM | POA: Diagnosis not present

## 2015-07-27 DIAGNOSIS — M9902 Segmental and somatic dysfunction of thoracic region: Secondary | ICD-10-CM | POA: Diagnosis not present

## 2015-07-27 DIAGNOSIS — M546 Pain in thoracic spine: Secondary | ICD-10-CM | POA: Diagnosis not present

## 2015-07-27 DIAGNOSIS — M542 Cervicalgia: Secondary | ICD-10-CM | POA: Diagnosis not present

## 2015-07-27 DIAGNOSIS — M9901 Segmental and somatic dysfunction of cervical region: Secondary | ICD-10-CM | POA: Diagnosis not present

## 2015-07-29 DIAGNOSIS — E538 Deficiency of other specified B group vitamins: Secondary | ICD-10-CM | POA: Diagnosis not present

## 2015-07-29 DIAGNOSIS — M818 Other osteoporosis without current pathological fracture: Secondary | ICD-10-CM | POA: Diagnosis not present

## 2015-07-29 DIAGNOSIS — R208 Other disturbances of skin sensation: Secondary | ICD-10-CM | POA: Diagnosis not present

## 2015-07-29 DIAGNOSIS — Z79899 Other long term (current) drug therapy: Secondary | ICD-10-CM | POA: Diagnosis not present

## 2015-07-29 DIAGNOSIS — E559 Vitamin D deficiency, unspecified: Secondary | ICD-10-CM | POA: Diagnosis not present

## 2015-07-29 DIAGNOSIS — G603 Idiopathic progressive neuropathy: Secondary | ICD-10-CM | POA: Diagnosis not present

## 2015-07-29 DIAGNOSIS — G609 Hereditary and idiopathic neuropathy, unspecified: Secondary | ICD-10-CM | POA: Diagnosis not present

## 2015-07-29 DIAGNOSIS — E539 Vitamin B deficiency, unspecified: Secondary | ICD-10-CM | POA: Diagnosis not present

## 2015-07-29 DIAGNOSIS — R5383 Other fatigue: Secondary | ICD-10-CM | POA: Diagnosis not present

## 2015-08-10 DIAGNOSIS — M9901 Segmental and somatic dysfunction of cervical region: Secondary | ICD-10-CM | POA: Diagnosis not present

## 2015-08-10 DIAGNOSIS — M542 Cervicalgia: Secondary | ICD-10-CM | POA: Diagnosis not present

## 2015-08-10 DIAGNOSIS — M546 Pain in thoracic spine: Secondary | ICD-10-CM | POA: Diagnosis not present

## 2015-08-10 DIAGNOSIS — M9902 Segmental and somatic dysfunction of thoracic region: Secondary | ICD-10-CM | POA: Diagnosis not present

## 2015-08-16 DIAGNOSIS — Z0000 Encounter for general adult medical examination without abnormal findings: Secondary | ICD-10-CM | POA: Diagnosis not present

## 2015-08-16 DIAGNOSIS — E6609 Other obesity due to excess calories: Secondary | ICD-10-CM | POA: Diagnosis not present

## 2015-08-16 DIAGNOSIS — I10 Essential (primary) hypertension: Secondary | ICD-10-CM | POA: Diagnosis not present

## 2015-08-16 DIAGNOSIS — Z1389 Encounter for screening for other disorder: Secondary | ICD-10-CM | POA: Diagnosis not present

## 2015-08-16 DIAGNOSIS — Z6832 Body mass index (BMI) 32.0-32.9, adult: Secondary | ICD-10-CM | POA: Diagnosis not present

## 2015-08-16 DIAGNOSIS — E782 Mixed hyperlipidemia: Secondary | ICD-10-CM | POA: Diagnosis not present

## 2015-08-17 DIAGNOSIS — E1140 Type 2 diabetes mellitus with diabetic neuropathy, unspecified: Secondary | ICD-10-CM | POA: Diagnosis not present

## 2015-08-24 DIAGNOSIS — G603 Idiopathic progressive neuropathy: Secondary | ICD-10-CM | POA: Diagnosis not present

## 2015-08-24 DIAGNOSIS — E1140 Type 2 diabetes mellitus with diabetic neuropathy, unspecified: Secondary | ICD-10-CM | POA: Diagnosis not present

## 2015-08-24 DIAGNOSIS — E539 Vitamin B deficiency, unspecified: Secondary | ICD-10-CM | POA: Diagnosis not present

## 2015-09-07 DIAGNOSIS — M542 Cervicalgia: Secondary | ICD-10-CM | POA: Diagnosis not present

## 2015-09-07 DIAGNOSIS — M9901 Segmental and somatic dysfunction of cervical region: Secondary | ICD-10-CM | POA: Diagnosis not present

## 2015-09-07 DIAGNOSIS — M9902 Segmental and somatic dysfunction of thoracic region: Secondary | ICD-10-CM | POA: Diagnosis not present

## 2015-09-07 DIAGNOSIS — M546 Pain in thoracic spine: Secondary | ICD-10-CM | POA: Diagnosis not present

## 2015-09-20 DIAGNOSIS — G603 Idiopathic progressive neuropathy: Secondary | ICD-10-CM | POA: Diagnosis not present

## 2015-09-20 DIAGNOSIS — E1140 Type 2 diabetes mellitus with diabetic neuropathy, unspecified: Secondary | ICD-10-CM | POA: Diagnosis not present

## 2015-09-20 DIAGNOSIS — E539 Vitamin B deficiency, unspecified: Secondary | ICD-10-CM | POA: Diagnosis not present

## 2015-09-20 DIAGNOSIS — G4709 Other insomnia: Secondary | ICD-10-CM | POA: Diagnosis not present

## 2015-10-05 DIAGNOSIS — M9903 Segmental and somatic dysfunction of lumbar region: Secondary | ICD-10-CM | POA: Diagnosis not present

## 2015-10-05 DIAGNOSIS — M546 Pain in thoracic spine: Secondary | ICD-10-CM | POA: Diagnosis not present

## 2015-10-05 DIAGNOSIS — M9902 Segmental and somatic dysfunction of thoracic region: Secondary | ICD-10-CM | POA: Diagnosis not present

## 2015-10-05 DIAGNOSIS — M542 Cervicalgia: Secondary | ICD-10-CM | POA: Diagnosis not present

## 2015-10-05 DIAGNOSIS — M9901 Segmental and somatic dysfunction of cervical region: Secondary | ICD-10-CM | POA: Diagnosis not present

## 2015-11-30 DIAGNOSIS — M546 Pain in thoracic spine: Secondary | ICD-10-CM | POA: Diagnosis not present

## 2015-11-30 DIAGNOSIS — M9902 Segmental and somatic dysfunction of thoracic region: Secondary | ICD-10-CM | POA: Diagnosis not present

## 2015-11-30 DIAGNOSIS — M542 Cervicalgia: Secondary | ICD-10-CM | POA: Diagnosis not present

## 2015-11-30 DIAGNOSIS — M9903 Segmental and somatic dysfunction of lumbar region: Secondary | ICD-10-CM | POA: Diagnosis not present

## 2015-11-30 DIAGNOSIS — M9901 Segmental and somatic dysfunction of cervical region: Secondary | ICD-10-CM | POA: Diagnosis not present

## 2015-12-19 DIAGNOSIS — E1142 Type 2 diabetes mellitus with diabetic polyneuropathy: Secondary | ICD-10-CM | POA: Diagnosis not present

## 2015-12-19 DIAGNOSIS — G603 Idiopathic progressive neuropathy: Secondary | ICD-10-CM | POA: Diagnosis not present

## 2015-12-19 DIAGNOSIS — G4709 Other insomnia: Secondary | ICD-10-CM | POA: Diagnosis not present

## 2015-12-19 DIAGNOSIS — E119 Type 2 diabetes mellitus without complications: Secondary | ICD-10-CM | POA: Diagnosis not present

## 2015-12-19 DIAGNOSIS — D696 Thrombocytopenia, unspecified: Secondary | ICD-10-CM | POA: Diagnosis not present

## 2015-12-19 DIAGNOSIS — Z1389 Encounter for screening for other disorder: Secondary | ICD-10-CM | POA: Diagnosis not present

## 2015-12-19 DIAGNOSIS — E1140 Type 2 diabetes mellitus with diabetic neuropathy, unspecified: Secondary | ICD-10-CM | POA: Diagnosis not present

## 2015-12-19 DIAGNOSIS — N62 Hypertrophy of breast: Secondary | ICD-10-CM | POA: Diagnosis not present

## 2015-12-19 DIAGNOSIS — E539 Vitamin B deficiency, unspecified: Secondary | ICD-10-CM | POA: Diagnosis not present

## 2015-12-19 DIAGNOSIS — E291 Testicular hypofunction: Secondary | ICD-10-CM | POA: Diagnosis not present

## 2015-12-20 DIAGNOSIS — H2512 Age-related nuclear cataract, left eye: Secondary | ICD-10-CM | POA: Diagnosis not present

## 2015-12-20 DIAGNOSIS — H5213 Myopia, bilateral: Secondary | ICD-10-CM | POA: Diagnosis not present

## 2015-12-20 DIAGNOSIS — H04123 Dry eye syndrome of bilateral lacrimal glands: Secondary | ICD-10-CM | POA: Diagnosis not present

## 2015-12-20 DIAGNOSIS — H2513 Age-related nuclear cataract, bilateral: Secondary | ICD-10-CM | POA: Diagnosis not present

## 2015-12-20 DIAGNOSIS — H52203 Unspecified astigmatism, bilateral: Secondary | ICD-10-CM | POA: Diagnosis not present

## 2015-12-20 DIAGNOSIS — H524 Presbyopia: Secondary | ICD-10-CM | POA: Diagnosis not present

## 2015-12-28 DIAGNOSIS — M9901 Segmental and somatic dysfunction of cervical region: Secondary | ICD-10-CM | POA: Diagnosis not present

## 2015-12-28 DIAGNOSIS — M542 Cervicalgia: Secondary | ICD-10-CM | POA: Diagnosis not present

## 2015-12-28 DIAGNOSIS — M546 Pain in thoracic spine: Secondary | ICD-10-CM | POA: Diagnosis not present

## 2015-12-28 DIAGNOSIS — M9902 Segmental and somatic dysfunction of thoracic region: Secondary | ICD-10-CM | POA: Diagnosis not present

## 2015-12-28 DIAGNOSIS — M9903 Segmental and somatic dysfunction of lumbar region: Secondary | ICD-10-CM | POA: Diagnosis not present

## 2016-01-10 NOTE — Patient Instructions (Signed)
Your procedure is scheduled on: 01/17/2016  [facility] to [facility] at   730  AM.  Call this number if you have problems the morning of surgery: [phone]   Do not eat food or drink liquids :After Midnight.      Take these medicines the morning of surgery with A SIP OF WATER: xanax, neurontin, oxycodone. Take your inhaler before you come.   Do not wear jewelry, make-up or nail polish.  Do not wear lotions, powders, or perfumes. You may wear deodorant.  Do not shave 48 hours prior to surgery.  Do not bring valuables to the hospital.  Contacts, dentures or bridgework may not be worn into surgery.  Leave suitcase in the car. After surgery it may be brought to your room.  For patients admitted to the hospital, checkout time is 11:00 AM the day of discharge.   Patients discharged the day of surgery will not be allowed to drive home.  :     Please read over the following fact sheets that you were given: Coughing and Deep Breathing, Surgical Site Infection Prevention, Anesthesia Post-op Instructions and Care and Recovery After Surgery    Cataract A cataract is a clouding of the lens of the eye. When a lens becomes cloudy, vision is reduced based on the degree and nature of the clouding. Many cataracts reduce vision to some degree. Some cataracts make people more near-sighted as they develop. Other cataracts increase glare. Cataracts that are ignored and become worse can sometimes look white. The white color can be seen through the pupil. CAUSES   Aging. However, cataracts may occur at any age, even in newborns.   Certain drugs.   Trauma to the eye.   Certain diseases such as diabetes.   Specific eye diseases such as chronic inflammation inside the eye or a sudden attack of a rare form of glaucoma.   Inherited or acquired medical problems.  SYMPTOMS   Gradual, progressive drop in vision in the affected eye.   Severe, rapid visual loss. This most often happens when trauma is the cause.    DIAGNOSIS  To detect a cataract, an eye doctor examines the lens. Cataracts are best diagnosed with an exam of the eyes with the pupils enlarged (dilated) by drops.  TREATMENT  For an early cataract, vision may improve by using different eyeglasses or stronger lighting. If that does not help your vision, surgery is the only effective treatment. A cataract needs to be surgically removed when vision loss interferes with your everyday activities, such as driving, reading, or watching TV. A cataract may also have to be removed if it prevents examination or treatment of another eye problem. Surgery removes the cloudy lens and usually replaces it with a substitute lens (intraocular lens, IOL).  At a time when both you and your doctor agree, the cataract will be surgically removed. If you have cataracts in both eyes, only one is usually removed at a time. This allows the operated eye to heal and be out of danger from any possible problems after surgery (such as infection or poor wound healing). In rare cases, a cataract may be doing damage to your eye. In these cases, your caregiver may advise surgical removal right away. The vast majority of people who have cataract surgery have better vision afterward. HOME CARE INSTRUCTIONS  If you are not planning surgery, you may be asked to do the following:  Use different eyeglasses.   Use stronger or brighter lighting.  Ask your eye doctor about reducing your medicine dose or changing medicines if it is thought that a medicine caused your cataract. Changing medicines does not make the cataract go away on its own.   Become familiar with your surroundings. Poor vision can lead to injury. Avoid bumping into things on the affected side. You are at a higher risk for tripping or falling.   Exercise extreme care when driving or operating machinery.   Wear sunglasses if you are sensitive to bright light or experiencing problems with glare.  SEEK IMMEDIATE MEDICAL CARE  IF:   You have a worsening or sudden vision loss.   You notice redness, swelling, or increasing pain in the eye.   You have a fever.  Document Released: 02/19/2005 Document Revised: 02/08/2011 Document Reviewed: 10/13/2010 [facility] Patient Information 2012 [email or web address] INSTRUCTIONS POST-ANESTHESIA  IMMEDIATELY FOLLOWING SURGERY:  Do not drive or operate machinery for the first twenty four hours after surgery.  Do not make any important decisions for twenty four hours after surgery or while taking narcotic pain medications or sedatives.  If you develop intractable nausea and vomiting or a severe headache please notify your doctor immediately.  FOLLOW-UP:  Please make an appointment with your surgeon as instructed. You do not need to follow up with anesthesia unless specifically instructed to do so.  WOUND CARE INSTRUCTIONS (if applicable):  Keep a dry clean dressing on the anesthesia/puncture wound site if there is drainage.  Once the wound has quit draining you may leave it open to air.  Generally you should leave the bandage intact for twenty four hours unless there is drainage.  If the epidural site drains for more than 36-48 hours please call the anesthesia department.  QUESTIONS?:  Please feel free to call your physician or the hospital operator if you have any questions, and they will be happy to assist you.

## 2016-01-12 ENCOUNTER — Encounter (HOSPITAL_COMMUNITY): Payer: Self-pay

## 2016-01-12 ENCOUNTER — Encounter (HOSPITAL_COMMUNITY)
Admission: RE | Admit: 2016-01-12 | Discharge: 2016-01-12 | Disposition: A | Discharge status: Home / Self Care | Payer: Medicare Other | Source: Ambulatory Visit | Attending: Ophthalmology | Admitting: Ophthalmology

## 2016-01-12 DIAGNOSIS — Z01812 Encounter for preprocedural laboratory examination: Secondary | ICD-10-CM | POA: Insufficient documentation

## 2016-01-12 DIAGNOSIS — Z01810 Encounter for preprocedural cardiovascular examination: Secondary | ICD-10-CM | POA: Insufficient documentation

## 2016-01-12 DIAGNOSIS — H268 Other specified cataract: Secondary | ICD-10-CM | POA: Insufficient documentation

## 2016-01-12 HISTORY — DX: Anxiety disorder, unspecified: F41.9

## 2016-01-12 HISTORY — DX: Unspecified osteoarthritis, unspecified site: M19.90

## 2016-01-12 HISTORY — DX: Polyneuropathy, unspecified: G62.9

## 2016-01-12 LAB — CBC
HCT: 40.8 % (ref 39.0–52.0)
Hemoglobin: 13.9 g/dL (ref 13.0–17.0)
MCH: 33.2 pg (ref 26.0–34.0)
MCHC: 34.1 g/dL (ref 30.0–36.0)
MCV: 97.4 fL (ref 78.0–100.0)
PLATELETS: 132 10*3/uL — AB (ref 150–400)
RBC: 4.19 MIL/uL — ABNORMAL LOW (ref 4.22–5.81)
RDW: 12.9 % (ref 11.5–15.5)
WBC: 6.6 10*3/uL (ref 4.0–10.5)

## 2016-01-12 LAB — BASIC METABOLIC PANEL
Anion gap: 8 (ref 5–15)
BUN: 18 mg/dL (ref 6–20)
CALCIUM: 9 mg/dL (ref 8.9–10.3)
CO2: 32 mmol/L (ref 22–32)
CREATININE: 0.89 mg/dL (ref 0.61–1.24)
Chloride: 95 mmol/L — ABNORMAL LOW (ref 101–111)
Glucose, Bld: 95 mg/dL (ref 65–99)
Potassium: 3.6 mmol/L (ref 3.5–5.1)
SODIUM: 135 mmol/L (ref 135–145)

## 2016-01-17 ENCOUNTER — Ambulatory Visit (HOSPITAL_COMMUNITY)
Admission: RE | Admit: 2016-01-17 | Discharge: 2016-01-17 | Disposition: A | Discharge status: Home / Self Care | Payer: Medicare Other | Source: Ambulatory Visit | Attending: Ophthalmology | Admitting: Ophthalmology

## 2016-01-17 ENCOUNTER — Ambulatory Visit (HOSPITAL_COMMUNITY): Payer: Medicare Other | Admitting: Anesthesiology

## 2016-01-17 ENCOUNTER — Encounter (HOSPITAL_COMMUNITY)
Admission: RE | Disposition: A | Discharge status: Home / Self Care | Payer: Self-pay | Source: Ambulatory Visit | Attending: Ophthalmology

## 2016-01-17 ENCOUNTER — Encounter (HOSPITAL_COMMUNITY): Payer: Self-pay | Admitting: Anesthesiology

## 2016-01-17 DIAGNOSIS — Z87891 Personal history of nicotine dependence: Secondary | ICD-10-CM | POA: Diagnosis not present

## 2016-01-17 DIAGNOSIS — F419 Anxiety disorder, unspecified: Secondary | ICD-10-CM | POA: Diagnosis not present

## 2016-01-17 DIAGNOSIS — Z86718 Personal history of other venous thrombosis and embolism: Secondary | ICD-10-CM | POA: Diagnosis not present

## 2016-01-17 DIAGNOSIS — Z7982 Long term (current) use of aspirin: Secondary | ICD-10-CM | POA: Diagnosis not present

## 2016-01-17 DIAGNOSIS — H269 Unspecified cataract: Secondary | ICD-10-CM | POA: Diagnosis not present

## 2016-01-17 DIAGNOSIS — J449 Chronic obstructive pulmonary disease, unspecified: Secondary | ICD-10-CM | POA: Insufficient documentation

## 2016-01-17 DIAGNOSIS — D649 Anemia, unspecified: Secondary | ICD-10-CM | POA: Diagnosis not present

## 2016-01-17 DIAGNOSIS — Z79810 Long term (current) use of selective estrogen receptor modulators (SERMs): Secondary | ICD-10-CM | POA: Insufficient documentation

## 2016-01-17 DIAGNOSIS — Z79899 Other long term (current) drug therapy: Secondary | ICD-10-CM | POA: Insufficient documentation

## 2016-01-17 DIAGNOSIS — H2512 Age-related nuclear cataract, left eye: Secondary | ICD-10-CM | POA: Diagnosis not present

## 2016-01-17 DIAGNOSIS — H2511 Age-related nuclear cataract, right eye: Secondary | ICD-10-CM | POA: Diagnosis not present

## 2016-01-17 HISTORY — PX: CATARACT EXTRACTION W/PHACO: SHX586

## 2016-01-17 SURGERY — PHACOEMULSIFICATION, CATARACT, WITH IOL INSERTION
Anesthesia: Monitor Anesthesia Care | Site: Eye | Laterality: Right

## 2016-01-17 MED ORDER — TETRACAINE 0.5 % OP SOLN OPTIME - NO CHARGE
OPHTHALMIC | Status: DC | PRN
  Administered 2016-01-17: 1 [drp] via OPHTHALMIC

## 2016-01-17 MED ORDER — PROVISC 10 MG/ML IO SOLN
INTRAOCULAR | Status: DC | PRN
  Administered 2016-01-17: 0.85 mL via INTRAOCULAR

## 2016-01-17 MED ORDER — EPINEPHRINE PF 1 MG/ML IJ SOLN
INTRAMUSCULAR | Status: AC
  Filled 2016-01-17: qty 1

## 2016-01-17 MED ORDER — MIDAZOLAM HCL 2 MG/2ML IJ SOLN
INTRAMUSCULAR | Status: AC
  Filled 2016-01-17: qty 2

## 2016-01-17 MED ORDER — FENTANYL CITRATE (PF) 100 MCG/2ML IJ SOLN
INTRAMUSCULAR | Status: AC
  Filled 2016-01-17: qty 2

## 2016-01-17 MED ORDER — CYCLOPENTOLATE-PHENYLEPHRINE 0.2-1 % OP SOLN
1.0000 [drp] | OPHTHALMIC | Status: AC
  Administered 2016-01-17 (×3): 1 [drp] via OPHTHALMIC

## 2016-01-17 MED ORDER — EPINEPHRINE PF 1 MG/ML IJ SOLN
INTRAOCULAR | Status: DC | PRN
  Administered 2016-01-17: 500 mL

## 2016-01-17 MED ORDER — PHENYLEPHRINE HCL 2.5 % OP SOLN
1.0000 [drp] | OPHTHALMIC | Status: AC
  Administered 2016-01-17 (×3): 1 [drp] via OPHTHALMIC

## 2016-01-17 MED ORDER — TETRACAINE HCL 0.5 % OP SOLN
1.0000 [drp] | OPHTHALMIC | Status: AC
  Administered 2016-01-17 (×3): 1 [drp] via OPHTHALMIC

## 2016-01-17 MED ORDER — BSS IO SOLN
INTRAOCULAR | Status: DC | PRN
  Administered 2016-01-17: 15 mL

## 2016-01-17 MED ORDER — KETOROLAC TROMETHAMINE 0.5 % OP SOLN
1.0000 [drp] | OPHTHALMIC | Status: AC
  Administered 2016-01-17 (×3): 1 [drp] via OPHTHALMIC

## 2016-01-17 MED ORDER — LACTATED RINGERS IV SOLN
INTRAVENOUS | Status: DC
  Administered 2016-01-17: 09:00:00 via INTRAVENOUS

## 2016-01-17 MED ORDER — MIDAZOLAM HCL 2 MG/2ML IJ SOLN
1.0000 mg | INTRAMUSCULAR | Status: DC | PRN
  Administered 2016-01-17: 1 mg via INTRAVENOUS

## 2016-01-17 MED ORDER — FENTANYL CITRATE (PF) 100 MCG/2ML IJ SOLN
25.0000 ug | INTRAMUSCULAR | Status: DC | PRN
  Administered 2016-01-17: 25 ug via INTRAVENOUS

## 2016-01-17 SURGICAL SUPPLY — 10 items
CLOTH BEACON ORANGE TIMEOUT ST (SAFETY) ×2 IMPLANT
EYE SHIELD UNIVERSAL CLEAR (GAUZE/BANDAGES/DRESSINGS) ×2 IMPLANT
GLOVE BIO SURGEON STRL SZ 6.5 (GLOVE) ×1 IMPLANT
GLOVE BIO SURGEONS STRL SZ 6.5 (GLOVE) ×1
GLOVE EXAM NITRILE MD LF STRL (GLOVE) ×2 IMPLANT
PAD ARMBOARD 7.5X6 YLW CONV (MISCELLANEOUS) ×2 IMPLANT
SIGHTPATH CAT PROC W REG LENS (Ophthalmic Related) ×3 IMPLANT
TAPE SURG TRANSPORE 1 IN (GAUZE/BANDAGES/DRESSINGS) IMPLANT
TAPE SURGICAL TRANSPORE 1 IN (GAUZE/BANDAGES/DRESSINGS) ×2
WATER STERILE IRR 250ML POUR (IV SOLUTION) ×2 IMPLANT

## 2016-01-17 NOTE — Op Note (Signed)
Patient brought to the operating room and prepped and draped in the usual manner.  Lid speculum inserted in right eye.  Stab incision made at the twelve o'clock position.  Provisc instilled in the anterior chamber.   A 2.4 mm. Stab incision was made temporally.  An anterior capsulotomy was done with a bent 25 gauge needle.  The nucleus was hydrodissected.  The Phaco tip was inserted in the anterior chamber and the nucleus was emulsified.  CDE was 4.34.  The cortical material was then removed with the I and A tip.  Posterior capsule was the polished.  The anterior chamber was deepened with Provisc.  A 17.0 Diopter Alcon [id number] IOL was then inserted in the capsular bag.  Provisc was then removed with the I and A tip.  The wound was then hydrated.  Patient sent to the Recovery Room in good condition with follow up in my office.  Preoperative Diagnosis:  Nuclear Cataract OD Postoperative Diagnosis:  Same Procedure name: Kelman Phacoemulsification OD with IOL

## 2016-01-17 NOTE — Anesthesia Postprocedure Evaluation (Signed)
Anesthesia Post Note  Patient: Wesley Ho  Procedure(s) Performed: Procedure(s) (LRB): CATARACT EXTRACTION PHACO AND INTRAOCULAR LENS PLACEMENT (IOC) (Right)  Patient location during evaluation: PACU Anesthesia Type: MAC Level of consciousness: awake and alert and oriented Pain management: pain level controlled Vital Signs Assessment: post-procedure vital signs reviewed and stable Respiratory status: spontaneous breathing Cardiovascular status: blood pressure returned to baseline Postop Assessment: no signs of nausea or vomiting Anesthetic complications: no    Last Vitals:  Vitals:   01/17/16 0925 01/17/16 0930  BP: (!) 93/49 (!) 100/54  Pulse:    Resp: 10 15  Temp:      Last Pain:  Vitals:   01/17/16 0841  TempSrc: Oral                 [name]

## 2016-01-17 NOTE — Discharge Instructions (Signed)
[facility] Instructions [address]- [place] [id number] [address]      1. Avoid closing eyes tightly. One often closes the eye tightly when laughing, talking, sneezing, coughing or if they feel irritated. At these times, you should be careful not to close your eyes tightly.  2. Instill eye drops as instructed. To instill drops in your eye, open it, look up and have someone gently pull the lower lid down and instill a couple of drops inside the lower lid.  3. Do not touch upper lid.  4. Take Advil or Tylenol for pain.  5. You may use either eye for near work, such as reading or sewing and you may watch television.  6. You may have your hair done at the beauty parlor at any time.  7. Wear dark glasses with or without your own glasses if you are in bright light.  8. Call our office at [phone] or [phone] if you have sharp pain in your eye or unusual symptoms.  9.  FOLLOW UP WITH DR. SHAPIRO TODAY IN HIS [place] OFFICE AT 3:15 pm.    I have received a copy of the above instructions and will follow them.     IF YOU ARE IN IMMEDIATE DANGER CALL 911!  It is important for you to keep your follow-up appointment with your physician after discharge, OR, for you /your caregiver to make a follow-up appointment with your physician / medical provider after discharge. General Anesthesia, Adult, Care After These instructions provide you with information about caring for yourself after your procedure. Your health care provider may also give you more specific instructions. Your treatment has been planned according to current medical practices, but problems sometimes occur. Call your health care provider if you have any problems or questions after your procedure. What can I expect after the procedure? After the procedure, it is common to have:  Vomiting.  A sore throat.  Mental slowness. It is common to feel:  Nauseous.  Cold or  shivery.  Sleepy.  Tired.  Sore or achy, even in parts of your body where you did not have surgery. Follow these instructions at home: For at least 24 hours after the procedure:  Do not:  Participate in activities where you could fall or become injured.  Drive.  Use heavy machinery.  Drink alcohol.  Take sleeping pills or medicines that cause drowsiness.  Make important decisions or sign legal documents.  Take care of children on your own.  Rest. Eating and drinking  If you vomit, drink water, juice, or soup when you can drink without vomiting.  Drink enough fluid to keep your urine clear or pale yellow.  Make sure you have little or no nausea before eating solid foods.  Follow the diet recommended by your health care provider. General instructions  Have a responsible adult stay with you until you are awake and alert.  Return to your normal activities as told by your health care provider. Ask your health care provider what activities are safe for you.  Take over-the-counter and prescription medicines only as told by your health care provider.  If you smoke, do not smoke without supervision.  Keep all follow-up visits as told by your health care provider. This is important. Contact a health care provider if:  You continue to have nausea or vomiting at home, and medicines are not helpful.  You cannot drink fluids or start eating again.  You cannot urinate after 8-12 hours.  You develop a skin rash.  You have fever.  You have increasing redness at the site of your procedure. Get help right away if:  You have difficulty breathing.  You have chest pain.  You have unexpected bleeding.  You feel that you are having a life-threatening or urgent problem. This information is not intended to replace advice given to you by your health care provider. Make sure you discuss any questions you have with your health care provider. Document Released: 05/28/2000  Document Revised: 07/25/2015 Document Reviewed: 02/03/2015 Elsevier Interactive Patient Education  2017 [employer]  Show these instructions to the next healthcare provider you see.

## 2016-01-17 NOTE — Transfer of Care (Signed)
Immediate Anesthesia Transfer of Care Note  Patient: Wesley Ho  Procedure(s) Performed: Procedure(s) with comments: CATARACT EXTRACTION PHACO AND INTRAOCULAR LENS PLACEMENT (IOC) (Right) - CDE: 4.34  Patient Location: Short Stay  Anesthesia Type:MAC  Level of Consciousness: awake  Airway & Oxygen Therapy: Patient Spontanous Breathing  Post-op Assessment: Report given to RN  Post vital signs: Reviewed and stable  Last Vitals:  Vitals:   01/17/16 0925 01/17/16 0930  BP: (!) 93/49 (!) 100/54  Pulse:    Resp: 10 15  Temp:      Last Pain:  Vitals:   01/17/16 0841  TempSrc: Oral      Patients Stated Pain Goal: 6 (01/17/16 0841)  Complications: No apparent anesthesia complications

## 2016-01-17 NOTE — Anesthesia Preprocedure Evaluation (Signed)
Anesthesia Evaluation  Patient identified by MRN, date of birth, ID band Patient awake    Reviewed: Allergy & Precautions, NPO status , Patient's Chart, lab work & pertinent test results  Airway Mallampati: I  TM Distance: >3 FB     Dental  (+) Teeth Intact   Pulmonary COPD, former smoker,    breath sounds clear to auscultation       Cardiovascular + DVT   Rhythm:Regular Rate:Bradycardia     Neuro/Psych Anxiety    GI/Hepatic negative GI ROS, (+)     substance abuse  alcohol use,   Endo/Other    Renal/GU      Musculoskeletal   Abdominal   Peds  Hematology  (+) anemia ,   Anesthesia Other Findings   Reproductive/Obstetrics                             Anesthesia Physical Anesthesia Plan  ASA: III  Anesthesia Plan: MAC   Post-op Pain Management:    Induction: Intravenous  Airway Management Planned: Nasal Cannula  Additional Equipment:   Intra-op Plan:   Post-operative Plan:   Informed Consent: I have reviewed the patients History and Physical, chart, labs and discussed the procedure including the risks, benefits and alternatives for the proposed anesthesia with the patient or authorized representative who has indicated his/her understanding and acceptance.     Plan Discussed with:   Anesthesia Plan Comments:         Anesthesia Quick Evaluation

## 2016-01-17 NOTE — H&P (Signed)
The patient was re examined and there is no change in the patients condition since the original H and P. 

## 2016-01-18 ENCOUNTER — Encounter (HOSPITAL_COMMUNITY): Payer: Self-pay | Admitting: Ophthalmology

## 2016-01-20 DIAGNOSIS — M546 Pain in thoracic spine: Secondary | ICD-10-CM | POA: Diagnosis not present

## 2016-01-20 DIAGNOSIS — M9903 Segmental and somatic dysfunction of lumbar region: Secondary | ICD-10-CM | POA: Diagnosis not present

## 2016-01-20 DIAGNOSIS — M9902 Segmental and somatic dysfunction of thoracic region: Secondary | ICD-10-CM | POA: Diagnosis not present

## 2016-01-20 DIAGNOSIS — M542 Cervicalgia: Secondary | ICD-10-CM | POA: Diagnosis not present

## 2016-01-20 DIAGNOSIS — M9901 Segmental and somatic dysfunction of cervical region: Secondary | ICD-10-CM | POA: Diagnosis not present

## 2016-01-25 ENCOUNTER — Encounter (HOSPITAL_COMMUNITY): Payer: Self-pay

## 2016-01-25 ENCOUNTER — Encounter (HOSPITAL_COMMUNITY)
Admission: RE | Admit: 2016-01-25 | Discharge: 2016-01-25 | Disposition: A | Discharge status: Home / Self Care | Payer: Medicare Other | Source: Ambulatory Visit | Attending: Ophthalmology | Admitting: Ophthalmology

## 2016-01-30 MED ORDER — KETOROLAC TROMETHAMINE 0.5 % OP SOLN
OPHTHALMIC | Status: AC
  Filled 2016-01-30: qty 5

## 2016-01-30 MED ORDER — TETRACAINE HCL 0.5 % OP SOLN
OPHTHALMIC | Status: AC
  Filled 2016-01-30: qty 4

## 2016-01-30 MED ORDER — PHENYLEPHRINE HCL 2.5 % OP SOLN
OPHTHALMIC | Status: AC
Start: 2016-01-30 — End: 2016-01-30
  Filled 2016-01-30: qty 15

## 2016-01-30 MED ORDER — CYCLOPENTOLATE-PHENYLEPHRINE 0.2-1 % OP SOLN
OPHTHALMIC | Status: AC
  Filled 2016-01-30: qty 2

## 2016-01-31 ENCOUNTER — Encounter (HOSPITAL_COMMUNITY): Payer: Self-pay

## 2016-01-31 ENCOUNTER — Ambulatory Visit (HOSPITAL_COMMUNITY)
Admission: RE | Admit: 2016-01-31 | Discharge: 2016-01-31 | Disposition: A | Discharge status: Home / Self Care | Payer: Medicare Other | Source: Ambulatory Visit | Attending: Ophthalmology | Admitting: Ophthalmology

## 2016-01-31 ENCOUNTER — Ambulatory Visit (HOSPITAL_COMMUNITY): Payer: Medicare Other | Admitting: Anesthesiology

## 2016-01-31 ENCOUNTER — Encounter (HOSPITAL_COMMUNITY)
Admission: RE | Disposition: A | Discharge status: Home / Self Care | Payer: Self-pay | Source: Ambulatory Visit | Attending: Ophthalmology

## 2016-01-31 DIAGNOSIS — H269 Unspecified cataract: Secondary | ICD-10-CM | POA: Insufficient documentation

## 2016-01-31 DIAGNOSIS — Z7982 Long term (current) use of aspirin: Secondary | ICD-10-CM | POA: Insufficient documentation

## 2016-01-31 DIAGNOSIS — Z79899 Other long term (current) drug therapy: Secondary | ICD-10-CM | POA: Diagnosis not present

## 2016-01-31 DIAGNOSIS — D649 Anemia, unspecified: Secondary | ICD-10-CM | POA: Insufficient documentation

## 2016-01-31 DIAGNOSIS — Z87891 Personal history of nicotine dependence: Secondary | ICD-10-CM | POA: Insufficient documentation

## 2016-01-31 DIAGNOSIS — F419 Anxiety disorder, unspecified: Secondary | ICD-10-CM | POA: Diagnosis not present

## 2016-01-31 DIAGNOSIS — Z79810 Long term (current) use of selective estrogen receptor modulators (SERMs): Secondary | ICD-10-CM | POA: Insufficient documentation

## 2016-01-31 DIAGNOSIS — H2512 Age-related nuclear cataract, left eye: Secondary | ICD-10-CM | POA: Diagnosis not present

## 2016-01-31 DIAGNOSIS — Z86718 Personal history of other venous thrombosis and embolism: Secondary | ICD-10-CM | POA: Diagnosis not present

## 2016-01-31 HISTORY — PX: CATARACT EXTRACTION W/PHACO: SHX586

## 2016-01-31 SURGERY — PHACOEMULSIFICATION, CATARACT, WITH IOL INSERTION
Anesthesia: Monitor Anesthesia Care | Site: Eye | Laterality: Left

## 2016-01-31 MED ORDER — FENTANYL CITRATE (PF) 100 MCG/2ML IJ SOLN
25.0000 ug | INTRAMUSCULAR | Status: DC | PRN
  Administered 2016-01-31: 25 ug via INTRAVENOUS

## 2016-01-31 MED ORDER — PROVISC 10 MG/ML IO SOLN
INTRAOCULAR | Status: DC | PRN
  Administered 2016-01-31: 0.85 mL via INTRAOCULAR

## 2016-01-31 MED ORDER — TETRACAINE 0.5 % OP SOLN OPTIME - NO CHARGE
OPHTHALMIC | Status: DC | PRN
  Administered 2016-01-31: 2 [drp] via OPHTHALMIC

## 2016-01-31 MED ORDER — KETOROLAC TROMETHAMINE 0.5 % OP SOLN
1.0000 [drp] | OPHTHALMIC | Status: AC
  Administered 2016-01-31 (×3): 1 [drp] via OPHTHALMIC

## 2016-01-31 MED ORDER — LACTATED RINGERS IV SOLN
INTRAVENOUS | Status: DC
  Administered 2016-01-31: 07:00:00 via INTRAVENOUS

## 2016-01-31 MED ORDER — TETRACAINE HCL 0.5 % OP SOLN
1.0000 [drp] | OPHTHALMIC | Status: AC
  Administered 2016-01-31 (×3): 1 [drp] via OPHTHALMIC

## 2016-01-31 MED ORDER — EPINEPHRINE PF 1 MG/ML IJ SOLN
INTRAOCULAR | Status: DC | PRN
  Administered 2016-01-31: 500 mL

## 2016-01-31 MED ORDER — LIDOCAINE HCL (PF) 1 % IJ SOLN
INTRAMUSCULAR | Status: AC
  Filled 2016-01-31: qty 2

## 2016-01-31 MED ORDER — BSS IO SOLN
INTRAOCULAR | Status: DC | PRN
  Administered 2016-01-31: 15 mL

## 2016-01-31 MED ORDER — PHENYLEPHRINE HCL 2.5 % OP SOLN
1.0000 [drp] | OPHTHALMIC | Status: AC
  Administered 2016-01-31 (×3): 1 [drp] via OPHTHALMIC

## 2016-01-31 MED ORDER — MIDAZOLAM HCL 2 MG/2ML IJ SOLN
INTRAMUSCULAR | Status: AC
  Filled 2016-01-31: qty 2

## 2016-01-31 MED ORDER — EPINEPHRINE PF 1 MG/ML IJ SOLN
INTRAMUSCULAR | Status: AC
  Filled 2016-01-31: qty 1

## 2016-01-31 MED ORDER — CYCLOPENTOLATE-PHENYLEPHRINE 0.2-1 % OP SOLN
1.0000 [drp] | OPHTHALMIC | Status: AC
  Administered 2016-01-31 (×3): 1 [drp] via OPHTHALMIC

## 2016-01-31 MED ORDER — FENTANYL CITRATE (PF) 100 MCG/2ML IJ SOLN
INTRAMUSCULAR | Status: AC
  Filled 2016-01-31: qty 2

## 2016-01-31 MED ORDER — MIDAZOLAM HCL 2 MG/2ML IJ SOLN
1.0000 mg | INTRAMUSCULAR | Status: DC | PRN
  Administered 2016-01-31: 2 mg via INTRAVENOUS

## 2016-01-31 SURGICAL SUPPLY — 10 items

## 2016-01-31 NOTE — Anesthesia Preprocedure Evaluation (Signed)
Anesthesia Evaluation  Patient identified by MRN, date of birth, ID band Patient awake    Reviewed: Allergy & Precautions, NPO status , Patient's Chart, lab work & pertinent test results  Airway Mallampati: I  TM Distance: >3 FB     Dental  (+) Teeth Intact   Pulmonary COPD, former smoker,    breath sounds clear to auscultation       Cardiovascular + DVT   Rhythm:Regular Rate:Bradycardia     Neuro/Psych Anxiety    GI/Hepatic negative GI ROS, (+)     substance abuse  alcohol use,   Endo/Other    Renal/GU      Musculoskeletal   Abdominal   Peds  Hematology  (+) anemia ,   Anesthesia Other Findings   Reproductive/Obstetrics                             Anesthesia Physical Anesthesia Plan  ASA: III  Anesthesia Plan: MAC   Post-op Pain Management:    Induction: Intravenous  Airway Management Planned: Nasal Cannula  Additional Equipment:   Intra-op Plan:   Post-operative Plan:   Informed Consent: I have reviewed the patients History and Physical, chart, labs and discussed the procedure including the risks, benefits and alternatives for the proposed anesthesia with the patient or authorized representative who has indicated his/her understanding and acceptance.     Plan Discussed with:   Anesthesia Plan Comments:         Anesthesia Quick Evaluation  

## 2016-01-31 NOTE — Transfer of Care (Signed)
Immediate Anesthesia Transfer of Care Note  Patient: Wesley Ho  Procedure(s) Performed: Procedure(s) (LRB): CATARACT EXTRACTION PHACO AND INTRAOCULAR LENS PLACEMENT (IOC) (Left)  Patient Location: Shortstay  Anesthesia Type: MAC  Level of Consciousness: awake  Airway & Oxygen Therapy: Patient Spontanous Breathing   Post-op Assessment: Report given to PACU RN, Post -op Vital signs reviewed and stable and Patient moving all extremities  Post vital signs: Reviewed and stable  Complications: No apparent anesthesia complications

## 2016-01-31 NOTE — H&P (Signed)
The patient was re examined and there is no change in the patients condition since the original H and P. 

## 2016-01-31 NOTE — Op Note (Signed)
Patient brought to the operating room and prepped and draped in the usual manner.  Lid speculum inserted in left eye.  Stab incision made at the twelve o'clock position.  Provisc instilled in the anterior chamber.   A 2.4 mm. Stab incision was made temporally.  An anterior capsulotomy was done with a bent 25 gauge needle.  The nucleus was hydrodissected.  The Phaco tip was inserted in the anterior chamber and the nucleus was emulsified.  CDE was 5.69.  The cortical material was then removed with the I and A tip.  Posterior capsule was the polished.  The anterior chamber was deepened with Provisc.  A 17.0 Diopter Alcon [id number] IOL was then inserted in the capsular bag.  Provisc was then removed with the I and A tip.  The wound was then hydrated.  Patient sent to the Recovery Room in good condition with follow up in my office.  Preoperative Diagnosis:  Nuclear Cataract OS Postoperative Diagnosis:  Same Procedure name: Kelman Phacoemulsification OS with IOL

## 2016-01-31 NOTE — Anesthesia Postprocedure Evaluation (Signed)
Anesthesia Post Note  Patient: Wesley Ho  Procedure(s) Performed: Procedure(s) (LRB): CATARACT EXTRACTION PHACO AND INTRAOCULAR LENS PLACEMENT (IOC) (Left)  Anesthesia Post Evaluation  Last Vitals:  Vitals:   01/31/16 0715 01/31/16 0720  BP: 125/69 122/82  Pulse:    Resp: (!) 22 16  Temp:      Last Pain:  Vitals:   01/31/16 0640  TempSrc: Oral               Anesthesia Post-op Note  Patient: Wesley Ho  Procedure(s) Performed: Procedure(s) (LRB): CATARACT EXTRACTION PHACO AND INTRAOCULAR LENS PLACEMENT (IOC) (Left)  Patient Location:  Short Stay  Anesthesia Type: MAC  Level of Consciousness: awake  Airway and Oxygen Therapy: Patient Spontanous Breathing  Post-op Pain: none  Post-op Assessment: Post-op Vital signs reviewed, Patient's Cardiovascular Status Stable, Respiratory Function Stable, Patent Airway, No signs of Nausea or vomiting and Pain level controlled  Post-op Vital Signs: Reviewed and stable  Complications: No apparent anesthesia complications    Inis Borneman

## 2016-01-31 NOTE — Anesthesia Procedure Notes (Signed)
Procedure Name: MAC Date/Time: 01/31/2016 7:24 AM Performed by: Franco NonesYATES, Kilie Rund S Pre-anesthesia Checklist: Patient identified, Emergency Drugs available, Suction available, Timeout performed and Patient being monitored Patient Re-evaluated:Patient Re-evaluated prior to inductionOxygen Delivery Method: Nasal Cannula

## 2016-01-31 NOTE — Discharge Instructions (Signed)
[facility] Instructions [address]- [place] [id number] [address]      1. Avoid closing eyes tightly. One often closes the eye tightly when laughing, talking, sneezing, coughing or if they feel irritated. At these times, you should be careful not to close your eyes tightly.  2. Instill eye drops as instructed. To instill drops in your eye, open it, look up and have someone gently pull the lower lid down and instill a couple of drops inside the lower lid.  3. Do not touch upper lid.  4. Take Advil or Tylenol for pain.  5. You may use either eye for near work, such as reading or sewing and you may watch television.  6. You may have your hair done at the beauty parlor at any time.  7. Wear dark glasses with or without your own glasses if you are in bright light.  8. Call our office at [phone] or [phone] if you have sharp pain in your eye or unusual symptoms.  9.  FOLLOW UP WITH DR. SHAPIRO TODAY IN HIS [place] OFFICE AT 2:45pm.    I have received a copy of the above instructions and will follow them.     IF YOU ARE IN IMMEDIATE DANGER CALL 911!  It is important for you to keep your follow-up appointment with your physician after discharge, OR, for you /your caregiver to make a follow-up appointment with your physician / medical provider after discharge.  Show these instructions to the next healthcare provider you see.  Monitored Anesthesia Care Anesthesia is a term that refers to techniques, procedures, and medicines that help a person stay safe and comfortable during a medical procedure. Monitored anesthesia care, or sedation, is one type of anesthesia. Your anesthesia specialist may recommend sedation if you will be having a procedure that does not require you to be unconscious, such as:  Cataract surgery.  A dental procedure.  A biopsy.  A colonoscopy. During the procedure, you may receive a medicine to help you relax  (sedative). There are three levels of sedation:  Mild sedation. At this level, you may feel awake and relaxed. You will be able to follow directions.  Moderate sedation. At this level, you will be sleepy. You may not remember the procedure.  Deep sedation. At this level, you will be asleep. You will not remember the procedure. The more medicine you are given, the deeper your level of sedation will be. Depending on how you respond to the procedure, the anesthesia specialist may change your level of sedation or the type of anesthesia to fit your needs. An anesthesia specialist will monitor you closely during the procedure. Let your health care provider know about:  Any allergies you have.  All medicines you are taking, including vitamins, herbs, eye drops, creams, and over-the-counter medicines.  Any use of steroids (by mouth or as a cream).  Any problems you or family members have had with sedatives and anesthetic medicines.  Any blood disorders you have.  Any surgeries you have had.  Any medical conditions you have, such as sleep apnea.  Whether you are pregnant or may be pregnant.  Any use of cigarettes, alcohol, or street drugs. What are the risks? Generally, this is a safe procedure. However, problems may occur, including:  Getting too much medicine (oversedation).  Nausea.  Allergic reaction to medicines.  Trouble breathing. If this happens, a breathing tube may be used to help  with breathing. It will be removed when you are awake and breathing on your own.  Heart trouble.  Lung trouble. Before the procedure Staying hydrated  Follow instructions from your health care provider about hydration, which may include:  Up to 2 hours before the procedure - you may continue to drink clear liquids, such as water, clear fruit juice, black coffee, and plain tea. Eating and drinking restrictions  Follow instructions from your health care provider about eating and drinking, which  may include:  8 hours before the procedure - stop eating heavy meals or foods such as meat, fried foods, or fatty foods.  6 hours before the procedure - stop eating light meals or foods, such as toast or cereal.  6 hours before the procedure - stop drinking milk or drinks that contain milk.  2 hours before the procedure - stop drinking clear liquids. Medicines  Ask your health care provider about:  Changing or stopping your regular medicines. This is especially important if you are taking diabetes medicines or blood thinners.  Taking medicines such as aspirin and ibuprofen. These medicines can thin your blood. Do not take these medicines before your procedure if your health care provider instructs you not to. Tests and exams  You will have a physical exam.  You may have blood tests done to show:  How well your kidneys and liver are working.  How well your blood can clot.  General instructions  Plan to have someone take you home from the hospital or clinic.  If you will be going home right after the procedure, plan to have someone with you for 24 hours. What happens during the procedure?  Your blood pressure, heart rate, breathing, level of pain and overall condition will be monitored.  An IV tube will be inserted into one of your veins.  Your anesthesia specialist will give you medicines as needed to keep you comfortable during the procedure. This may mean changing the level of sedation.  The procedure will be performed. After the procedure  Your blood pressure, heart rate, breathing rate, and blood oxygen level will be monitored until the medicines you were given have worn off.  Do not drive for 24 hours if you received a sedative.  You may:  Feel sleepy, clumsy, or nauseous.  Feel forgetful about what happened after the procedure.  Have a sore throat if you had a breathing tube during the procedure.  Vomit. This information is not intended to replace advice  given to you by your health care provider. Make sure you discuss any questions you have with your health care provider. Document Released: 11/15/2004 Document Revised: 07/29/2015 Document Reviewed: 06/12/2015 Elsevier Interactive Patient Education  2017 [employer]

## 2016-02-02 ENCOUNTER — Encounter (HOSPITAL_COMMUNITY): Payer: Self-pay | Admitting: Ophthalmology

## 2016-02-03 DIAGNOSIS — M9903 Segmental and somatic dysfunction of lumbar region: Secondary | ICD-10-CM | POA: Diagnosis not present

## 2016-02-03 DIAGNOSIS — M542 Cervicalgia: Secondary | ICD-10-CM | POA: Diagnosis not present

## 2016-02-03 DIAGNOSIS — M9902 Segmental and somatic dysfunction of thoracic region: Secondary | ICD-10-CM | POA: Diagnosis not present

## 2016-02-03 DIAGNOSIS — M546 Pain in thoracic spine: Secondary | ICD-10-CM | POA: Diagnosis not present

## 2016-02-03 DIAGNOSIS — M9901 Segmental and somatic dysfunction of cervical region: Secondary | ICD-10-CM | POA: Diagnosis not present

## 2016-02-09 ENCOUNTER — Ambulatory Visit: Payer: Medicare Other | Admitting: "Endocrinology

## 2016-02-17 DIAGNOSIS — M542 Cervicalgia: Secondary | ICD-10-CM | POA: Diagnosis not present

## 2016-02-17 DIAGNOSIS — M9901 Segmental and somatic dysfunction of cervical region: Secondary | ICD-10-CM | POA: Diagnosis not present

## 2016-02-17 DIAGNOSIS — M9902 Segmental and somatic dysfunction of thoracic region: Secondary | ICD-10-CM | POA: Diagnosis not present

## 2016-02-17 DIAGNOSIS — M546 Pain in thoracic spine: Secondary | ICD-10-CM | POA: Diagnosis not present

## 2016-02-17 DIAGNOSIS — M9903 Segmental and somatic dysfunction of lumbar region: Secondary | ICD-10-CM | POA: Diagnosis not present

## 2016-02-20 ENCOUNTER — Emergency Department (HOSPITAL_COMMUNITY): Payer: Medicare Other

## 2016-02-20 ENCOUNTER — Encounter (HOSPITAL_COMMUNITY): Payer: Self-pay | Admitting: *Deleted

## 2016-02-20 ENCOUNTER — Emergency Department (HOSPITAL_COMMUNITY)
Admission: EM | Admit: 2016-02-20 | Discharge: 2016-02-20 | Disposition: A | Discharge status: Home / Self Care | Payer: Medicare Other | Attending: Emergency Medicine | Admitting: Emergency Medicine

## 2016-02-20 DIAGNOSIS — J441 Chronic obstructive pulmonary disease with (acute) exacerbation: Secondary | ICD-10-CM | POA: Insufficient documentation

## 2016-02-20 DIAGNOSIS — M25512 Pain in left shoulder: Secondary | ICD-10-CM

## 2016-02-20 DIAGNOSIS — M545 Low back pain: Secondary | ICD-10-CM | POA: Diagnosis not present

## 2016-02-20 DIAGNOSIS — M79651 Pain in right thigh: Secondary | ICD-10-CM | POA: Insufficient documentation

## 2016-02-20 DIAGNOSIS — Z79899 Other long term (current) drug therapy: Secondary | ICD-10-CM | POA: Insufficient documentation

## 2016-02-20 DIAGNOSIS — S46002A Unspecified injury of muscle(s) and tendon(s) of the rotator cuff of left shoulder, initial encounter: Secondary | ICD-10-CM | POA: Diagnosis not present

## 2016-02-20 DIAGNOSIS — M75101 Unspecified rotator cuff tear or rupture of right shoulder, not specified as traumatic: Secondary | ICD-10-CM | POA: Diagnosis not present

## 2016-02-20 DIAGNOSIS — Y999 Unspecified external cause status: Secondary | ICD-10-CM | POA: Diagnosis not present

## 2016-02-20 DIAGNOSIS — X58XXXA Exposure to other specified factors, initial encounter: Secondary | ICD-10-CM | POA: Diagnosis not present

## 2016-02-20 DIAGNOSIS — Z7982 Long term (current) use of aspirin: Secondary | ICD-10-CM | POA: Insufficient documentation

## 2016-02-20 DIAGNOSIS — Y939 Activity, unspecified: Secondary | ICD-10-CM | POA: Insufficient documentation

## 2016-02-20 DIAGNOSIS — Y929 Unspecified place or not applicable: Secondary | ICD-10-CM | POA: Diagnosis not present

## 2016-02-20 DIAGNOSIS — Z87891 Personal history of nicotine dependence: Secondary | ICD-10-CM | POA: Diagnosis not present

## 2016-02-20 DIAGNOSIS — S46009A Unspecified injury of muscle(s) and tendon(s) of the rotator cuff of unspecified shoulder, initial encounter: Secondary | ICD-10-CM

## 2016-02-20 MED ORDER — PREDNISONE 20 MG PO TABS: ORAL_TABLET | ORAL | 0 refills | Status: DC

## 2016-02-20 NOTE — ED Triage Notes (Signed)
Pt c/o right upper leg pain; pt states the pain started in his lower back and runs down his leg, but now is in his upper right thigh, pt denies any obvious injury but states he works out at [employer]

## 2016-02-20 NOTE — ED Provider Notes (Signed)
[facility] Provider Note   CSN: [id number] Arrival date & time: 02/20/16  0346  Time seen 05:20 AM   History   Chief Complaint Chief Complaint  Patient presents with  . Leg Pain    HPI Chuck Hintdward W Lish is a 60 y.o. male.  HPI  patient states he has been having pain in his left shoulder for the past month. He states it only hurts with certain movements and weaker through it seems to be with abduction. He states sometimes he gets numbness in the palm of his hands but the last time he had it was several days ago. He denies any neck pain. Patient is right-handed.  Patient states 2 weeks ago he had pain in his midline lower back that he describes as a "lightening bolts". He states it would increase when he moved and felt better when he laid still. He states it was radiating into his right knee. He states his back pain is been gone now for the past week. He states he's been having pain in the dorsum of his thigh for the past 2-3 weeks. It is only painful with certain movements. He states he cannot tell if he has numbness in his feet because he has chronic underlying neuropathy in his feet. He states he's been having difficulty walking because of dizzy spells. He denies any urinary or fecal incontinence. He denies any fever.  Patient has a history of DVT and states the pain in his leg feels different from that. He states he has been seeing a chiropractor who feels like his symptoms are improving.   PCP  Cassell SmilesFUSCO,LAWRENCE J., MD Orthopedist Dr Hilda LiasKeeling  Past Medical History:  Diagnosis Date  . Anxiety   . Arthritis   . Bronchitis   . Chronic diarrhea   . Chronic edema of lower extremity 09/28/2010  . COPD (chronic obstructive pulmonary disease) (HCC)   . DVT (deep venous thrombosis) (HCC) 09/28/2010  . Emphysema of lung (HCC)   . Fatigue   . H/O Thrombocytopenia 09/28/2010  . Left radial fracture   . Multiple lung nodules 09/28/2010  . Neuropathy [facility](HCC)     Patient Active Problem  List   Diagnosis Date Noted  . Hyperglycemia 07/08/2014  . COPD (chronic obstructive pulmonary disease) (HCC)   . Dizziness 07/07/2014  . Generalized weakness 07/07/2014  . ETOH abuse 07/07/2014  . Macrocytic anemia 07/07/2014  . Left radial fracture 07/07/2014  . Anxiety 07/07/2014  . Diarrhea   . Acute bronchitis 03/16/2012  . Acute sinusitis 03/16/2012  . COPD exacerbation (HCC) 03/16/2012  . Alcohol consumption heavy 03/16/2012  . Tobacco abuse 03/16/2012  . Macrocytosis without anemia 03/16/2012  . Leg pain, bilateral 03/16/2012  . Personal history of DVT (deep vein thrombosis) 03/16/2012  . Hyponatremia 03/16/2012  . DVT (deep venous thrombosis) (HCC) 09/28/2010  . Thrombocytopenia (HCC) 09/28/2010  . Chronic edema of lower extremity 09/28/2010  . Multiple lung nodules 09/28/2010    Past Surgical History:  Procedure Laterality Date  . CATARACT EXTRACTION W/PHACO Right 01/17/2016   Procedure: CATARACT EXTRACTION PHACO AND INTRAOCULAR LENS PLACEMENT (IOC);  Surgeon: Jethro BolusMark Shapiro, MD;  Location: [facility] ORS;  Service: Ophthalmology;  Laterality: Right;  CDE: 4.34  . CATARACT EXTRACTION W/PHACO Left 01/31/2016   Procedure: CATARACT EXTRACTION PHACO AND INTRAOCULAR LENS PLACEMENT (IOC);  Surgeon: Jethro BolusMark Shapiro, MD;  Location: [facility] ORS;  Service: Ophthalmology;  Laterality: Left;  CDE: 5.69  . CHOLECYSTECTOMY    . MOUTH SURGERY  Home Medications    Prior to Admission medications   Medication Sig Start Date End Date Taking? Authorizing Provider  acetaminophen (TYLENOL) 500 MG tablet Take 500-1,000 mg by mouth every 6 (six) hours as needed for mild pain.    Historical Provider, MD  alprazolam Prudy Feeler(XANAX) 2 MG tablet Take 2 mg by mouth 4 (four) times daily as needed. For anxiety. 12/20/15   Historical Provider, MD  aspirin EC 81 MG tablet Take 81 mg by mouth daily.    Historical Provider, MD  aspirin-sod bicarb-citric acid (ALKA-SELTZER) 325 MG TBEF tablet Take 325 mg by mouth  every 6 (six) hours as needed. 01/31/16   Historical Provider, MD  Biotin 5000 MCG TABS Take 5,000 mcg by mouth daily.    Historical Provider, MD  cholecalciferol (VITAMIN D) 1000 units tablet Take 1,000 Units by mouth daily.    Historical Provider, MD  ferrous sulfate (IRON SUPPLEMENT) 325 (65 FE) MG tablet Take 65 mg by mouth daily with breakfast.    Historical Provider, MD  furosemide (LASIX) 20 MG tablet Take 60 mg by mouth daily.    Historical Provider, MD  gabapentin (NEURONTIN) 800 MG tablet Take 800 mg by mouth 2 (two) times daily. 12/19/15   Historical Provider, MD  hydrochlorothiazide (HYDRODIURIL) 25 MG tablet Take 25 mg by mouth daily.    Historical Provider, MD  Multiple Vitamins-Minerals (HM COMPLETE 50+ MENS ULTIMATE PO) Take 1 tablet by mouth daily.    Historical Provider, MD  Potassium 99 MG TABS Take 99 mg by mouth daily.    Historical Provider, MD  predniSONE (DELTASONE) 20 MG tablet Take 3 po QD x 3d , then 2 po QD x 3d then 1 po QD x 3d 02/20/16   Devoria AlbeIva Navdeep Halt, MD  Protein 500 MG CHEW Chew 4 tablets by mouth daily.     Historical Provider, MD  sildenafil (REVATIO) 20 MG tablet Take 100 mg by mouth daily. 12/23/15   Historical Provider, MD  tamoxifen (NOLVADEX) 20 MG tablet Take 20 mg by mouth daily. 12/19/15   Historical Provider, MD  vitamin B-12 (CYANOCOBALAMIN) 1000 MCG tablet Take 1,000 mcg by mouth daily.    Historical Provider, MD    Family History Family History  Problem Relation Age of Onset  . Diabetes Mother   . Clotting disorder Father     Social History Social History  Substance Use Topics  . Smoking status: Former Smoker    Packs/day: 1.00    Years: 40.00    Quit date: 01/11/2013  . Smokeless tobacco: Never Used  . Alcohol use No     Comment: stopped drinking 2016.  on disability for COPD Pt works out in a gym   Allergies   Patient has no known allergies.   Review of Systems Review of Systems  All other systems reviewed and are  negative.    Physical Exam Updated Vital Signs BP 135/81   Pulse 60   Temp 98.3 F (36.8 C) (Oral)   Resp 18   Ht 5\' 8"  (1.727 m)   Wt 200 lb (90.7 kg)   SpO2 96%   BMI 30.41 kg/m   Vital signs normal    Physical Exam  Constitutional: He appears well-developed and well-nourished. No distress.  HENT:  Head: Normocephalic and atraumatic.  Right Ear: External ear normal.  Left Ear: External ear normal.  Nose: Nose normal.  Eyes: Conjunctivae and EOM are normal.  Neck: Normal range of motion. Neck supple.  Cardiovascular: Normal rate.  Pulmonary/Chest: No respiratory distress.  Musculoskeletal: Normal range of motion. He exhibits no edema, tenderness or deformity.  On exam of patient's left shoulder he has no discomfort to palpation of his left shoulder or palpation during range of motion of the left shoulder. He does start to get some discomfort when his left arm is abducted which reproduces his complaints of pain. He has good distal pulses and equal grips.  On exam of patient's lower extremity he has no pain to palpation in the right hip, right thigh, or knee. He has excellent flexion of the hip and knee without any difficulty or pain. Patient's spine was palpated and he has no pain to palpation however he does indicate his pain was in the lower lumbar spine midline. There was no SI joint tenderness on either side. There was no pain in the sciatic notch. He has excellent range of motion of his waist without causing pain. Patient ambulated in the ED room without difficulty and without pain.     ED Treatments / Results  Labs (all labs ordered are listed, but only abnormal results are displayed) Labs Reviewed - No data to display  EKG  EKG Interpretation None       Radiology Dg Shoulder Left  Result Date: 02/20/2016 CLINICAL DATA:  Left arm pain EXAM: LEFT SHOULDER - 2+ VIEW COMPARISON:  None. FINDINGS: There is no evidence of fracture or dislocation. There is no  evidence of arthropathy or other focal bone abnormality. Soft tissues are unremarkable. IMPRESSION: No acute abnormality noted. Electronically Signed   By: Alcide Clever M.D.   On: 02/20/2016 07:06    Procedures Procedures (including critical care time)  Medications Ordered in ED Medications - No data to display   Initial Impression / Assessment and Plan / ED Course  I have reviewed the triage vital signs and the nursing notes.  Pertinent labs & imaging results that were available during my care of the patient were reviewed by me and considered in my medical decision making (see chart for details).  Clinical Course    X-ray was ordered of the left shoulder to look for calcific tendinitis or other underlying etiology suggestive of rotator cuff injury. Patient cannot reproduce his pain or his thigh pain in the ED. He can be followed up by orthopedics.   Final Clinical Impressions(s) / ED Diagnoses   Final diagnoses:  Acute pain of left shoulder  Rotator cuff injury, initial encounter  Pain of right thigh    New Prescriptions New Prescriptions   PREDNISONE (DELTASONE) 20 MG TABLET    Take 3 po QD x 3d , then 2 po QD x 3d then 1 po QD x 3d     Devoria Albe, MD 02/20/16 [phone]

## 2016-02-20 NOTE — Discharge Instructions (Signed)
Use ice and heat for comfort.  Take the prednisone as directed. Follow up with Dr Hilda LiasKeeling about your shoulder, thigh and back pain.

## 2016-02-22 ENCOUNTER — Ambulatory Visit (INDEPENDENT_AMBULATORY_CARE_PROVIDER_SITE_OTHER): Payer: Medicare Other | Admitting: Orthopaedic Surgery

## 2016-02-22 ENCOUNTER — Encounter: Payer: Self-pay | Admitting: Orthopaedic Surgery

## 2016-02-22 VITALS — BP 127/78 | HR 66 | Temp 98.4°F | Ht 68.0 in | Wt 223.0 lb

## 2016-02-22 DIAGNOSIS — M545 Low back pain: Secondary | ICD-10-CM | POA: Diagnosis not present

## 2016-02-22 DIAGNOSIS — M25512 Pain in left shoulder: Secondary | ICD-10-CM

## 2016-02-22 DIAGNOSIS — M5410 Radiculopathy, site unspecified: Secondary | ICD-10-CM | POA: Diagnosis not present

## 2016-02-22 MED ORDER — HYDROCODONE-ACETAMINOPHEN 5-325 MG PO TABS: 1.0000 | ORAL_TABLET | ORAL | 0 refills | Status: DC | PRN

## 2016-02-22 NOTE — Progress Notes (Signed)
Patient ON:Wesley Ho, male DOB:05/07/1955, 60 y.o. UXL:[id number]  Chief Complaint  Patient presents with  . New Problem    ER follow up left shoulder pain    HPI  Wesley Ho is a 60 y.o. male who has developed marked pain in the left shoulder over the last two weeks. He went to the ER on 02-19-16.  X-rays were negative of the shoulder.  I have reviewed the ER report, x-rays and x-ray report.  He has pain with overhead use of the left shoulder.  He has no trauma and no paresthesias.  He has pain sleeping.  He has tried rest, ice, heat, Tylenol with little help.  The ER asked that he come here. HPI  Body mass index is 33.91 kg/m.  ROS  Review of Systems  HENT: Negative for congestion.   Respiratory: Positive for cough and shortness of breath. Negative for choking.   Cardiovascular: Negative for chest pain and leg swelling.  Endocrine: Positive for cold intolerance.  Musculoskeletal: Positive for arthralgias.  Allergic/Immunologic: Positive for environmental allergies.  Psychiatric/Behavioral: The patient is nervous/anxious.     Past Medical History:  Diagnosis Date  . Anxiety   . Arthritis   . Bronchitis   . Chronic diarrhea   . Chronic edema of lower extremity 09/28/2010  . COPD (chronic obstructive pulmonary disease) (HCC)   . DVT (deep venous thrombosis) (HCC) 09/28/2010  . Emphysema of lung (HCC)   . Fatigue   . H/O Thrombocytopenia 09/28/2010  . Left radial fracture   . Multiple lung nodules 09/28/2010  . Neuropathy [facility](HCC)     Past Surgical History:  Procedure Laterality Date  . CATARACT EXTRACTION W/PHACO Right 01/17/2016   Procedure: CATARACT EXTRACTION PHACO AND INTRAOCULAR LENS PLACEMENT (IOC);  Surgeon: Jethro BolusMark Shapiro, MD;  Location: [facility] ORS;  Service: Ophthalmology;  Laterality: Right;  CDE: 4.34  . CATARACT EXTRACTION W/PHACO Left 01/31/2016   Procedure: CATARACT EXTRACTION PHACO AND INTRAOCULAR LENS PLACEMENT (IOC);  Surgeon: Jethro BolusMark Shapiro, MD;  Location:  [facility] ORS;  Service: Ophthalmology;  Laterality: Left;  CDE: 5.69  . CHOLECYSTECTOMY    . MOUTH SURGERY      Family History  Problem Relation Age of Onset  . Diabetes Mother   . Clotting disorder Father     Social History Social History  Substance Use Topics  . Smoking status: Former Smoker    Packs/day: 1.00    Years: 40.00    Quit date: 01/11/2013  . Smokeless tobacco: Never Used  . Alcohol use No     Comment: stopped drinking 2016.    No Known Allergies  Current Outpatient Prescriptions  Medication Sig Dispense Refill  . acetaminophen (TYLENOL) 500 MG tablet Take 500-1,000 mg by mouth every 6 (six) hours as needed for mild pain.    Marland Kitchen. alprazolam (XANAX) 2 MG tablet Take 2 mg by mouth 4 (four) times daily as needed. For anxiety.    Marland Kitchen. aspirin EC 81 MG tablet Take 81 mg by mouth daily.    Marland Kitchen. aspirin-sod bicarb-citric acid (ALKA-SELTZER) 325 MG TBEF tablet Take 325 mg by mouth every 6 (six) hours as needed.    . Biotin 5000 MCG TABS Take 5,000 mcg by mouth daily.    . cholecalciferol (VITAMIN D) 1000 units tablet Take 1,000 Units by mouth daily.    . ferrous sulfate (IRON SUPPLEMENT) 325 (65 FE) MG tablet Take 65 mg by mouth daily with breakfast.    . furosemide (LASIX) 20 MG tablet Take 60  mg by mouth daily.    Marland Kitchen. gabapentin (NEURONTIN) 800 MG tablet Take 800 mg by mouth 2 (two) times daily.    . hydrochlorothiazide (HYDRODIURIL) 25 MG tablet Take 25 mg by mouth daily.    Marland Kitchen. HYDROcodone-acetaminophen (NORCO/VICODIN) 5-325 MG tablet Take 1 tablet by mouth every 4 (four) hours as needed for moderate pain (Must last 14 days.Do not take and drive a car or use machinery.). 56 tablet 0  . Multiple Vitamins-Minerals (HM COMPLETE 50+ MENS ULTIMATE PO) Take 1 tablet by mouth daily.    . Potassium 99 MG TABS Take 99 mg by mouth daily.    . predniSONE (DELTASONE) 20 MG tablet Take 3 po QD x 3d , then 2 po QD x 3d then 1 po QD x 3d 18 tablet 0  . Protein 500 MG CHEW Chew 4 tablets by mouth  daily.     . sildenafil (REVATIO) 20 MG tablet Take 100 mg by mouth daily.  11  . tamoxifen (NOLVADEX) 20 MG tablet Take 20 mg by mouth daily.    . vitamin B-12 (CYANOCOBALAMIN) 1000 MCG tablet Take 1,000 mcg by mouth daily.     No current facility-administered medications for this visit.      Physical Exam  Blood pressure 127/78, pulse 66, temperature 98.4 F (36.9 C), height 5\' 8"  (1.727 m), weight 223 lb (101.2 kg).  Constitutional: overall normal hygiene, normal nutrition, well developed, normal grooming, normal body habitus. Assistive device:none  Musculoskeletal: gait and station Limp none, muscle tone and strength are normal, no tremors or atrophy is present.  .  Neurological: coordination overall normal.  Deep tendon reflex/nerve stretch intact.  Sensation normal.  Cranial nerves II-XII intact.   Skin:   Normal overall no scars, lesions, ulcers or rashes. No psoriasis.  Psychiatric: Alert and oriented x 3.  Recent memory intact, remote memory unclear.  Normal mood and affect. Well groomed.  Good eye contact.  Cardiovascular: overall no swelling, no varicosities, no edema bilaterally, normal temperatures of the legs and arms, no clubbing, cyanosis and good capillary refill.  Lymphatic: palpation is normal.  Examination of left Upper Extremity is done.  Inspection:   Overall:  Elbow non-tender without crepitus or defects, forearm non-tender without crepitus or defects, wrist non-tender without crepitus or defects, hand non-tender.    Shoulder: with glenohumeral joint tenderness, without effusion.   Upper arm: with swelling and tenderness   Range of motion:   Overall:  Full range of motion of the elbow, full range of motion of wrist and full range of motion in fingers.   Shoulder:  left  145 degrees forward flexion; 120 degrees abduction; 30 degrees internal rotation, 30 degrees external rotation, 10 degrees extension, 40 degrees adduction.   Stability:   Overall:   Shoulder, elbow and wrist stable   Strength and Tone:   Overall full shoulder muscles strength, full upper arm strength and normal upper arm bulk and tone.   The patient has been educated about the nature of the problem(s) and counseled on treatment options.  The patient appeared to understand what I have discussed and is in agreement with it.  Encounter Diagnosis  Name Primary?  . Acute pain of left shoulder Yes    PLAN Call if any problems.  Precautions discussed.  Continue current medications.   Return to clinic 3 weeks   Electronically Signed Darreld McleanWayne Pancho Rushing, MD 12/20/201710:17 AM

## 2016-02-23 ENCOUNTER — Ambulatory Visit (INDEPENDENT_AMBULATORY_CARE_PROVIDER_SITE_OTHER): Payer: Medicare Other | Admitting: "Endocrinology

## 2016-02-23 ENCOUNTER — Encounter: Payer: Self-pay | Admitting: "Endocrinology

## 2016-02-23 VITALS — BP 118/71 | HR 62 | Ht 68.0 in | Wt 227.0 lb

## 2016-02-23 DIAGNOSIS — N62 Hypertrophy of breast: Secondary | ICD-10-CM | POA: Diagnosis not present

## 2016-02-23 DIAGNOSIS — E291 Testicular hypofunction: Secondary | ICD-10-CM | POA: Diagnosis not present

## 2016-02-23 NOTE — Progress Notes (Signed)
Subjective:    Patient ID: Wesley Ho, male    DOB: 1955-07-23, PCP Cassell SmilesFUSCO,LAWRENCE J., MD   Past Medical History:  Diagnosis Date  . Anxiety   . Arthritis   . Bronchitis   . Chronic diarrhea   . Chronic edema of lower extremity 09/28/2010  . COPD (chronic obstructive pulmonary disease) (HCC)   . DVT (deep venous thrombosis) (HCC) 09/28/2010  . Emphysema of lung (HCC)   . Fatigue   . H/O Thrombocytopenia 09/28/2010  . Left radial fracture   . Multiple lung nodules 09/28/2010  . Neuropathy [facility](HCC)    Past Surgical History:  Procedure Laterality Date  . CATARACT EXTRACTION W/PHACO Right 01/17/2016   Procedure: CATARACT EXTRACTION PHACO AND INTRAOCULAR LENS PLACEMENT (IOC);  Surgeon: Jethro BolusMark Shapiro, MD;  Location: [facility];  Service: Ophthalmology;  Laterality: Right;  CDE: 4.34  . CATARACT EXTRACTION W/PHACO Left 01/31/2016   Procedure: CATARACT EXTRACTION PHACO AND INTRAOCULAR LENS PLACEMENT (IOC);  Surgeon: Jethro BolusMark Shapiro, MD;  Location: [facility];  Service: Ophthalmology;  Laterality: Left;  CDE: 5.69  . CHOLECYSTECTOMY    . MOUTH SURGERY     Social History   Social History  . Marital status: Divorced    Spouse name: N/A  . Number of children: N/A  . Years of education: N/A   Social History Main Topics  . Smoking status: Former Smoker    Packs/day: 1.00    Years: 40.00    Quit date: 01/11/2013  . Smokeless tobacco: Never Used  . Alcohol use No     Comment: stopped drinking 2016.  . Drug use: No  . Sexual activity: Not Currently    Birth control/ protection: None   Other Topics Concern  . None   Social History Narrative  . None   Outpatient Encounter Prescriptions as of 02/23/2016  Medication Sig  . acetaminophen (TYLENOL) 500 MG tablet Take 500-1,000 mg by mouth every 6 (six) hours as needed for mild pain.  Marland Kitchen. alprazolam (XANAX) 2 MG tablet Take 2 mg by mouth 4 (four) times daily as needed. For anxiety.  Marland Kitchen. aspirin EC 81 MG tablet Take 81 mg by mouth daily.  Marland Kitchen.  aspirin-sod bicarb-citric acid (ALKA-SELTZER) 325 MG TBEF tablet Take 325 mg by mouth every 6 (six) hours as needed.  . Biotin 5000 MCG TABS Take 5,000 mcg by mouth daily.  . cholecalciferol (VITAMIN D) 1000 units tablet Take 1,000 Units by mouth daily.  . ferrous sulfate (IRON SUPPLEMENT) 325 (65 FE) MG tablet Take 65 mg by mouth daily with breakfast.  . furosemide (LASIX) 20 MG tablet Take 60 mg by mouth daily.  Marland Kitchen. gabapentin (NEURONTIN) 800 MG tablet Take 800 mg by mouth 2 (two) times daily.  . hydrochlorothiazide (HYDRODIURIL) 25 MG tablet Take 25 mg by mouth daily.  Marland Kitchen. HYDROcodone-acetaminophen (NORCO/VICODIN) 5-325 MG tablet Take 1 tablet by mouth every 4 (four) hours as needed for moderate pain (Must last 14 days.Do not take and drive a car or use machinery.).  Marland Kitchen. Multiple Vitamins-Minerals (HM COMPLETE 50+ MENS ULTIMATE PO) Take 1 tablet by mouth daily.  . Potassium 99 MG TABS Take 99 mg by mouth daily.  . predniSONE (DELTASONE) 20 MG tablet Take 3 po QD x 3d , then 2 po QD x 3d then 1 po QD x 3d  . Protein 500 MG CHEW Chew 4 tablets by mouth daily.   . sildenafil (REVATIO) 20 MG tablet Take 100 mg by mouth daily.  . tamoxifen (NOLVADEX)  20 MG tablet Take 20 mg by mouth daily.  . vitamin B-12 (CYANOCOBALAMIN) 1000 MCG tablet Take 1,000 mcg by mouth daily.   No facility-administered encounter medications on file as of 02/23/2016.    ALLERGIES: No Known Allergies VACCINATION STATUS:  There is no immunization history on file for this patient.  HPI 60 year old gentleman with multiple medical problems as above. He is being seen in consultation for hypogonadism requested by Dr. Sherwood GamblerFusco. 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 He was diagnosed with hypogonadism approximately 4 years ago. He was being treated with testosterone injection (he is not sure about the dose) until October  2017. On 12/19/2015 he was found to have supraphysiologic testosterone level of 1374 which prompted the discontinuation of his medication. And he has not injected any testosterone since that day. On further interview patient reports that he did have shrunk testes for "years", progressive gynecomastia which forced him to consult with several surgeons and currently considering plastic surgery. -He fathers 2 grown children. His growth and development was uneventful. -He denies injury to the testicles, infections, radiation, nor chemotherapy. He denies history of significant head injury. -He has erectile dysfunction constantly with urology. - He is complaining of progressive weight gain of approximately 30 pounds over the last year. - He denies any history of sleep apnea, however he has atrial fibrillation on treatment. -Denies a history of liver, hematologic disorders.  Review of Systems   Constitutional: + weight gain, + fatigue, no subjective hyperthermia/hypothermia Eyes: no blurry vision, no xerophthalmia ENT: no sore throat, no nodules palpated in throat, no dysphagia/odynophagia, no hoarseness Cardiovascular: no CP/SOB/palpitations/leg swelling Respiratory: no cough/SOB Gastrointestinal: no N/V/D/C Musculoskeletal: no muscle/joint aches Skin: + Progressive bilateral gynecomastia, no rashes Neurological: no tremors/numbness/tingling/dizziness Psychiatric: no depression/anxiety  Objective:    BP 118/71   Pulse 62   Ht 5\' 8"  (1.727 m)   Wt 227 lb (103 kg)   BMI 34.52 kg/m   Wt Readings from Last 3 Encounters:  02/23/16 227 lb (103 kg)  02/22/16 223 lb (101.2 kg)  02/20/16 200 lb (90.7 kg)    Physical Exam Constitutional: overweight, in NAD Eyes: PERRLA, EOMI, no exophthalmos ENT: moist mucous membranes, no thyromegaly, no cervical lymphadenopathy Cardiovascular: RRR, No MRG Respiratory: CTA B Gastrointestinal: abdomen soft, NT, ND, BS+ Genitourinary: Bilaterally shrunk  testicles to 8-10 mL, nontender: No intrascrotal  masse lesion Musculoskeletal: no deformities, strength intact in all 4 Skin:  Significant bilateral large gynecomastia, moist, warm, no rashes Neurological: no tremor with outstretched hands, DTR normal in all 4   CMP     Component Value Date/Time   NA 135 01/12/2016 0900   K 3.6 01/12/2016 0900   CL 95 (L) 01/12/2016 0900   CO2 32 01/12/2016 0900   GLUCOSE 95 01/12/2016 0900   BUN 18 01/12/2016 0900   CREATININE 0.89 01/12/2016 0900   CALCIUM 9.0 01/12/2016 0900   PROT 8.4 (H) 07/28/2014 0700   ALBUMIN 3.6 07/28/2014 0700   AST 49 (H) 07/28/2014 0700   ALT 45 07/28/2014 0700   ALKPHOS 55 07/28/2014 0700   BILITOT 0.8 07/28/2014 0700   GFRNONAA >60 01/12/2016 0900   GFRAA >60 01/12/2016 0900   Diabetic Labs (most recent): Lab Results  Component Value Date   HGBA1C 6.1 (H) 07/08/2014    12/19/2015 total testosterone 1374.   Assessment & Plan:   1. Hypogonadism, male - This patient is being  seen at the kind request of Dr. Sherwood Gambler. He appears to have long-standing hypogonadism of multiple etiologies. -The most likely etiology seems to be separation of hypothalamic-pituitary-gonadal  Axis suppression by use of various bodybuilding elements including steroids, androgens, proteins, and opioids. - He has clinical sequelae of chronic hypogonadism including gynecomastia. Unfortunately his gynecomastia cannot be reversed with medical therapy. His only option would be plastic surgery which is unlikely to be covered by his insurance. He understands this process and currently saving money to pay for his surgery out of pocket. - Regarding his hypogonadism, it is appropriate to hold off testosterone replacement for the time-due to the fact that he went supraphysiologic at 1374 ( he is not even sure how much testosterone he was injecting at the time). He is still interested to get treated for hypogonadism. In about 8 weeks we will repeat his  total testosterone along with FSH/LH, PSA, CBC, hepatic function, and prolactin.  - If his total testosterone drops to below 250, he will be resumed at a lower dose of testosterone with regular follow-up. - He is not interested to keep his fertility.  - He  has polypharmacy including multiple supplements. I advised him to stay away from bodybuilding products including androgens, steroids, and various protein preparations. - I encouraged him to continue follow-up with his urologist regarding his ED. Testosterone replacement will not significantly alter his ED however will help with libido.  - I advised patient to maintain close follow up with Cassell Smiles., MD for primary care needs. Follow up plan: Return in about 9 weeks (around 04/26/2016).  Marquis Lunch, MD Phone: [phone]  Fax: [phone]   02/23/2016, 4:46 PM

## 2016-02-24 DIAGNOSIS — N62 Hypertrophy of breast: Secondary | ICD-10-CM | POA: Insufficient documentation

## 2016-03-08 DIAGNOSIS — G603 Idiopathic progressive neuropathy: Secondary | ICD-10-CM | POA: Diagnosis not present

## 2016-03-08 DIAGNOSIS — G4709 Other insomnia: Secondary | ICD-10-CM | POA: Diagnosis not present

## 2016-03-08 DIAGNOSIS — E1140 Type 2 diabetes mellitus with diabetic neuropathy, unspecified: Secondary | ICD-10-CM | POA: Diagnosis not present

## 2016-03-08 DIAGNOSIS — F064 Anxiety disorder due to known physiological condition: Secondary | ICD-10-CM | POA: Diagnosis not present

## 2016-03-08 DIAGNOSIS — R609 Edema, unspecified: Secondary | ICD-10-CM | POA: Diagnosis not present

## 2016-03-08 DIAGNOSIS — N529 Male erectile dysfunction, unspecified: Secondary | ICD-10-CM | POA: Diagnosis not present

## 2016-03-08 DIAGNOSIS — E539 Vitamin B deficiency, unspecified: Secondary | ICD-10-CM | POA: Diagnosis not present

## 2016-03-14 ENCOUNTER — Ambulatory Visit: Payer: Medicare Other | Admitting: Orthopaedic Surgery

## 2016-03-19 DIAGNOSIS — R3915 Urgency of urination: Secondary | ICD-10-CM | POA: Diagnosis not present

## 2016-03-19 DIAGNOSIS — N62 Hypertrophy of breast: Secondary | ICD-10-CM | POA: Diagnosis not present

## 2016-03-19 DIAGNOSIS — N5203 Combined arterial insufficiency and corporo-venous occlusive erectile dysfunction: Secondary | ICD-10-CM | POA: Diagnosis not present

## 2016-03-20 ENCOUNTER — Ambulatory Visit (INDEPENDENT_AMBULATORY_CARE_PROVIDER_SITE_OTHER): Payer: PPO | Admitting: Orthopaedic Surgery

## 2016-03-20 VITALS — BP 107/73 | HR 75 | Temp 98.1°F | Ht 68.0 in | Wt 223.0 lb

## 2016-03-20 DIAGNOSIS — R3915 Urgency of urination: Secondary | ICD-10-CM | POA: Diagnosis not present

## 2016-03-20 DIAGNOSIS — N62 Hypertrophy of breast: Secondary | ICD-10-CM | POA: Diagnosis not present

## 2016-03-20 DIAGNOSIS — M25512 Pain in left shoulder: Secondary | ICD-10-CM | POA: Diagnosis not present

## 2016-03-20 DIAGNOSIS — N5203 Combined arterial insufficiency and corporo-venous occlusive erectile dysfunction: Secondary | ICD-10-CM | POA: Diagnosis not present

## 2016-03-20 MED ORDER — HYDROCODONE-ACETAMINOPHEN 5-325 MG PO TABS: 1.0000 | ORAL_TABLET | ORAL | 0 refills | Status: DC | PRN

## 2016-03-20 NOTE — Progress Notes (Signed)
PROCEDURE NOTE:  The patient request injection, verbal consent was obtained.  The left shoulder was prepped appropriately after time out was performed.   Sterile technique was observed and injection of 1 cc of Depo-Medrol 40 mg with several cc's of plain xylocaine. Anesthesia was provided by ethyl chloride and a 20-gauge needle was used to inject the shoulder area. A posterior approach was used.  The injection was tolerated well.  A band aid dressing was applied.  The patient was advised to apply ice later today and tomorrow to the injection sight as needed.  His shoulder is better today but I have injected it as he still has a ways to go with his pain relief.  He is to resume his exercise program.  Return in one month.  Pain medicine given after checking state site for narcotics.  Call if any problem.  Electronically Signed Darreld McleanWayne Quinnie Barcelo, MD 1/16/20181:51 PM

## 2016-03-26 ENCOUNTER — Other Ambulatory Visit: Payer: Self-pay | Admitting: "Endocrinology

## 2016-03-26 DIAGNOSIS — R3915 Urgency of urination: Secondary | ICD-10-CM | POA: Diagnosis not present

## 2016-03-26 DIAGNOSIS — E291 Testicular hypofunction: Secondary | ICD-10-CM | POA: Diagnosis not present

## 2016-03-26 DIAGNOSIS — N5203 Combined arterial insufficiency and corporo-venous occlusive erectile dysfunction: Secondary | ICD-10-CM | POA: Diagnosis not present

## 2016-03-26 LAB — PROLACTIN: PROLACTIN: 11.3 ng/mL (ref 2.0–18.0)

## 2016-03-26 LAB — CBC WITH DIFFERENTIAL/PLATELET
BASOS PCT: 0 %
Basophils Absolute: 0 cells/uL (ref 0–200)
EOS PCT: 2 %
Eosinophils Absolute: 188 cells/uL (ref 15–500)
HCT: 43.4 % (ref 38.5–50.0)
HEMOGLOBIN: 14.7 g/dL (ref 13.2–17.1)
LYMPHS ABS: 2726 {cells}/uL (ref 850–3900)
LYMPHS PCT: 29 %
MCH: 33.5 pg — ABNORMAL HIGH (ref 27.0–33.0)
MCHC: 33.9 g/dL (ref 32.0–36.0)
MCV: 98.9 fL (ref 80.0–100.0)
MPV: 12.3 fL (ref 7.5–12.5)
Monocytes Absolute: 752 cells/uL (ref 200–950)
Monocytes Relative: 8 %
NEUTROS PCT: 61 %
Neutro Abs: 5734 cells/uL (ref 1500–7800)
Platelets: 181 10*3/uL (ref 140–400)
RBC: 4.39 MIL/uL (ref 4.20–5.80)
RDW: 14.9 % (ref 11.0–15.0)
WBC: 9.4 10*3/uL (ref 3.8–10.8)

## 2016-03-26 LAB — PSA: PSA: 1.6 ng/mL (ref <=4.0)

## 2016-03-26 LAB — HEPATIC FUNCTION PANEL
ALT: 19 U/L (ref 9–46)
AST: 20 U/L (ref 10–35)
Albumin: 4.1 g/dL (ref 3.6–5.1)
Alkaline Phosphatase: 41 U/L (ref 40–115)
BILIRUBIN DIRECT: 0.1 mg/dL (ref <=0.2)
BILIRUBIN INDIRECT: 0.4 mg/dL (ref 0.2–1.2)
BILIRUBIN TOTAL: 0.5 mg/dL (ref 0.2–1.2)
Total Protein: 7.5 g/dL (ref 6.1–8.1)

## 2016-03-26 LAB — FSH/LH
FSH: 10.9 m[IU]/mL — ABNORMAL HIGH (ref 1.6–8.0)
LH: 11.2 m[IU]/mL (ref 1.6–15.2)

## 2016-03-27 LAB — TESTOSTERONE TOTAL,FREE,BIO, MALES
ALBUMIN: 4.1 g/dL (ref 3.6–5.1)
SEX HORMONE BINDING: 64 nmol/L (ref 22–77)
TESTOSTERONE BIOAVAILABLE: 69.3 ng/dL — AB (ref 110.0–575.0)
TESTOSTERONE FREE: 36.8 pg/mL — AB (ref 46.0–224.0)
Testosterone: 487 ng/dL (ref 250–827)

## 2016-04-02 ENCOUNTER — Ambulatory Visit (INDEPENDENT_AMBULATORY_CARE_PROVIDER_SITE_OTHER): Payer: PPO | Admitting: "Endocrinology

## 2016-04-02 ENCOUNTER — Encounter: Payer: Self-pay | Admitting: "Endocrinology

## 2016-04-02 VITALS — BP 119/76 | HR 69 | Ht 68.0 in | Wt 230.0 lb

## 2016-04-02 DIAGNOSIS — N62 Hypertrophy of breast: Secondary | ICD-10-CM

## 2016-04-02 DIAGNOSIS — E291 Testicular hypofunction: Secondary | ICD-10-CM | POA: Diagnosis not present

## 2016-04-02 NOTE — Progress Notes (Signed)
Subjective:    Patient ID: Wesley Ho, male    DOB: 1955/03/27, PCP Glo Herring., MD   Past Medical History:  Diagnosis Date  . Anxiety   . Arthritis   . Bronchitis   . Chronic diarrhea   . Chronic edema of lower extremity 09/28/2010  . COPD (chronic obstructive pulmonary disease) ([place])   . DVT (deep venous thrombosis) ([place]) 09/28/2010  . Emphysema of lung ([place])   . Fatigue   . H/O Thrombocytopenia 09/28/2010  . Left radial fracture   . Multiple lung nodules 09/28/2010  . Neuropathy [facility])    Past Surgical History:  Procedure Laterality Date  . CATARACT EXTRACTION W/PHACO Right 01/17/2016   Procedure: CATARACT EXTRACTION PHACO AND INTRAOCULAR LENS PLACEMENT (IOC);  Surgeon: Rutherford Guys, MD;  Location: [facility];  Service: Ophthalmology;  Laterality: Right;  CDE: 4.34  . CATARACT EXTRACTION W/PHACO Left 01/31/2016   Procedure: CATARACT EXTRACTION PHACO AND INTRAOCULAR LENS PLACEMENT (IOC);  Surgeon: Rutherford Guys, MD;  Location: [facility];  Service: Ophthalmology;  Laterality: Left;  CDE: 5.69  . CHOLECYSTECTOMY    . MOUTH SURGERY     Social History   Social History  . Marital status: Divorced    Spouse name: N/A  . Number of children: N/A  . Years of education: N/A   Social History Main Topics  . Smoking status: Former Smoker    Packs/day: 1.00    Years: 40.00    Quit date: 01/11/2013  . Smokeless tobacco: Never Used  . Alcohol use No     Comment: stopped drinking 2016.  . Drug use: No  . Sexual activity: Not Currently    Birth control/ protection: None   Other Topics Concern  . None   Social History Narrative  . None   Outpatient Encounter Prescriptions as of 04/02/2016  Medication Sig  . UNABLE TO FIND Trimix Penile Injection Therapy  . acetaminophen (TYLENOL) 500 MG tablet Take 500-1,000 mg by mouth every 6 (six) hours as needed for mild pain.  Marland Kitchen alprazolam (XANAX) 2 MG tablet Take 2 mg by mouth 4 (four) times daily as needed. For anxiety.  Marland Kitchen aspirin  EC 81 MG tablet Take 81 mg by mouth daily.  Marland Kitchen aspirin-sod bicarb-citric acid (ALKA-SELTZER) 325 MG TBEF tablet Take 325 mg by mouth every 6 (six) hours as needed.  . Biotin 5000 MCG TABS Take 5,000 mcg by mouth daily.  . cholecalciferol (VITAMIN D) 1000 units tablet Take 1,000 Units by mouth daily.  . ferrous sulfate (IRON SUPPLEMENT) 325 (65 FE) MG tablet Take 65 mg by mouth daily with breakfast.  . furosemide (LASIX) 20 MG tablet Take 60 mg by mouth daily.  Marland Kitchen gabapentin (NEURONTIN) 800 MG tablet Take 800 mg by mouth 2 (two) times daily.  . hydrochlorothiazide (HYDRODIURIL) 25 MG tablet Take 25 mg by mouth daily.  Marland Kitchen HYDROcodone-acetaminophen (NORCO/VICODIN) 5-325 MG tablet Take 1 tablet by mouth every 4 (four) hours as needed for moderate pain (Must last 14 days.Do not take and drive a car or use machinery.).  Marland Kitchen Multiple Vitamins-Minerals (HM COMPLETE 50+ MENS ULTIMATE PO) Take 1 tablet by mouth daily.  . Potassium 99 MG TABS Take 99 mg by mouth daily.  . sildenafil (REVATIO) 20 MG tablet Take 100 mg by mouth daily.  . tamoxifen (NOLVADEX) 20 MG tablet Take 20 mg by mouth daily.  . vitamin B-12 (CYANOCOBALAMIN) 1000 MCG tablet Take 1,000 mcg by mouth daily.  . [DISCONTINUED] predniSONE (DELTASONE) 20 MG  tablet Take 3 po QD x 3d , then 2 po QD x 3d then 1 po QD x 3d  . [DISCONTINUED] Protein 500 MG CHEW Chew 4 tablets by mouth daily.    No facility-administered encounter medications on file as of 04/02/2016.    ALLERGIES: No Known Allergies VACCINATION STATUS:  There is no immunization history on file for this patient.  HPI 61 year old gentleman with multiple medical problems as above. He is being seen in consultation for hypogonadism requested by Dr. Gerarda Fraction. 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 He was diagnosed with hypogonadism approximately 4 years ago. He was being  treated with testosterone injection until October 2017. On 12/19/2015 he was found to have supraphysiologic testosterone level of 1374 which prompted the discontinuation of his medication. And he has not injected any testosterone since that day. On further interview patient reports that he did have shrunk testes for "years", progressive gynecomastia which forced him to consult with several surgeons and currently considering plastic surgery. -He fathers 2 grown children. His growth and development was uneventful. -He denies injury to the testicles, infections, radiation, nor chemotherapy. He denies history of significant head injury. -He has erectile dysfunction consulting with urology. - He is complaining of progressive weight gain of approximately 30 pounds over the last year. - He denies any history of sleep apnea, however he has atrial fibrillation on treatment. -Denies a history of liver, he has history of DVT.  Review of Systems   Constitutional: + weight gain, + fatigue, no subjective hyperthermia/hypothermia Eyes: no blurry vision, no xerophthalmia ENT: no sore throat, no nodules palpated in throat, no dysphagia/odynophagia, no hoarseness Cardiovascular: no CP/SOB/palpitations/leg swelling Respiratory: no cough/SOB Gastrointestinal: no N/V/D/C Musculoskeletal: no muscle/joint aches Skin: + Progressive bilateral gynecomastia, no rashes Neurological: no tremors/numbness/tingling/dizziness Psychiatric: no depression/anxiety  Objective:    BP 119/76   Pulse 69   Ht 5' 8" (1.727 m)   Wt 230 lb (104.3 kg)   BMI 34.97 kg/m   Wt Readings from Last 3 Encounters:  04/02/16 230 lb (104.3 kg)  03/20/16 223 lb (101.2 kg)  02/23/16 227 lb (103 kg)    Physical Exam Constitutional: overweight, in NAD Eyes: PERRLA, EOMI, no exophthalmos ENT: moist mucous membranes, no thyromegaly, no cervical lymphadenopathy Cardiovascular: RRR, No MRG Respiratory: CTA B Gastrointestinal: abdomen soft,  NT, ND, BS+ Genitourinary: Bilaterally shrunk testicles to 8-10 mL, nontender: No intrascrotal  masse lesion Musculoskeletal: no deformities, strength intact in all 4 Skin:  Significant bilateral large gynecomastia, moist, warm, no rashes Neurological: no tremor with outstretched hands, DTR normal in all 4   Recent Results (from the past 2160 hour(s))  CBC     Status: Abnormal   Collection Time: 01/12/16  9:00 AM  Result Value Ref Range   WBC 6.6 4.0 - 10.5 K/uL   RBC 4.19 (L) 4.22 - 5.81 MIL/uL   Hemoglobin 13.9 13.0 - 17.0 g/dL   HCT 40.8 39.0 - 52.0 %   MCV 97.4 78.0 - 100.0 fL   MCH 33.2 26.0 - 34.0 pg   MCHC 34.1 30.0 - 36.0 g/dL   RDW 12.9 11.5 - 15.5 %   Platelets 132 (L) 150 - 400 K/uL  Basic metabolic panel     Status: Abnormal   Collection Time: 01/12/16  9:00 AM  Result Value Ref Range   Sodium 135 135 - 145 mmol/L   Potassium 3.6 3.5 - 5.1  mmol/L   Chloride 95 (L) 101 - 111 mmol/L   CO2 32 22 - 32 mmol/L   Glucose, Bld 95 65 - 99 mg/dL   BUN 18 6 - 20 mg/dL   Creatinine, Ser 0.89 0.61 - 1.24 mg/dL   Calcium 9.0 8.9 - 10.3 mg/dL   GFR calc non Af Amer >60 >60 mL/min   GFR calc Af Amer >60 >60 mL/min    Comment: (NOTE) The eGFR has been calculated using the CKD EPI equation. This calculation has not been validated in all clinical situations. eGFR's persistently <60 mL/min signify possible Chronic Kidney Disease.    Anion gap 8 5 - 15  FSH/LH     Status: Abnormal   Collection Time: 03/26/16  7:28 AM  Result Value Ref Range   FSH 10.9 (H) 1.6 - 8.0 mIU/mL   LH 11.2 1.6 - 15.2 mIU/mL  CBC with Differential/Platelet     Status: Abnormal   Collection Time: 03/26/16  7:28 AM  Result Value Ref Range   WBC 9.4 3.8 - 10.8 K/uL   RBC 4.39 4.20 - 5.80 MIL/uL   Hemoglobin 14.7 13.2 - 17.1 g/dL   HCT 43.4 38.5 - 50.0 %   MCV 98.9 80.0 - 100.0 fL   MCH 33.5 (H) 27.0 - 33.0 pg   MCHC 33.9 32.0 - 36.0 g/dL   RDW 14.9 11.0 - 15.0 %   Platelets 181 140 - 400 K/uL    MPV 12.3 7.5 - 12.5 fL   Neutro Abs 5,734 1,500 - 7,800 cells/uL   Lymphs Abs 2,726 850 - 3,900 cells/uL   Monocytes Absolute 752 200 - 950 cells/uL   Eosinophils Absolute 188 15 - 500 cells/uL   Basophils Absolute 0 0 - 200 cells/uL   Neutrophils Relative % 61 %   Lymphocytes Relative 29 %   Monocytes Relative 8 %   Eosinophils Relative 2 %   Basophils Relative 0 %   Smear Review Criteria for review not met   Hepatic function panel     Status: None   Collection Time: 03/26/16  7:28 AM  Result Value Ref Range   Total Bilirubin 0.5 0.2 - 1.2 mg/dL   Bilirubin, Direct 0.1 <=0.2 mg/dL   Indirect Bilirubin 0.4 0.2 - 1.2 mg/dL   Alkaline Phosphatase 41 40 - 115 U/L   AST 20 10 - 35 U/L   ALT 19 9 - 46 U/L   Total Protein 7.5 6.1 - 8.1 g/dL   Albumin 4.1 3.6 - 5.1 g/dL  Prolactin     Status: None   Collection Time: 03/26/16  7:28 AM  Result Value Ref Range   Prolactin 11.3 2.0 - 18.0 ng/mL  PSA     Status: None   Collection Time: 03/26/16  7:28 AM  Result Value Ref Range   PSA 1.6 <=4.0 ng/mL    Comment:   The total PSA value from this assay system is standardized against the WHO standard. The test result will be approximately 20% lower when compared to the equimolar-standardized total PSA ([employer]). Comparison of serial PSA results should be interpreted with this fact in mind.   This test was performed using the Siemens chemiluminescent method. Values obtained from different assay methods cannot be used interchangeably. PSA levels, regardless of value, should not be interpreted as absolute evidence of the presence or absence of disease.     Testosterone Total,Free,Bio, Males     Status: Abnormal   Collection Time: 03/26/16  7:28 AM  Result Value Ref Range   Testosterone 487 250 - 827 ng/dL   Albumin 4.1 3.6 - 5.1 g/dL   Sex Hormone Binding 64 22 - 77 nmol/L   Testosterone, Free 36.8 (L) 46.0 - 224.0 pg/mL   Testosterone, Bioavailable 69.3 (L) 110.0 - 575.0 ng/dL      12/19/2015 total testosterone 1374.   Assessment & Plan:   1. Hypogonadism, male - He was seen last visit for  long-standing hypogonadism of multiple etiologies. - Lab work indicates more of primary hypogonadism. -The most likely etiology seems to be suppression  of hypothalamic-pituitary-gonadal  Axis suppression by use of various bodybuilding elements including steroids, androgens, proteins, and opioids. - He has clinical sequelae of chronic hypogonadism including gynecomastia. Unfortunately his gynecomastia cannot be reversed with medical therapy. His only option would be plastic surgery which is unlikely to be covered by his insurance. He understands this process and currently saving money to pay for his surgery out of pocket. - Regarding his hypogonadism: He was taken off of  testosterone replacement for the time-due to the fact that he went supraphysiologic at 1374. -  10-12 weeks after his last injection, he is previsit testosterone level is adequate at 487.  - He is advised to stay off of testosterone supplement for now.  - He would have repeat testosterone measurement in 3 months.  - On further discussion, he appears to be interested to keep his fertility in which case testosterone supplement with diminish this chance, and may be best to keep him off of testosterone supplement for a long time. - If he drops his plan for fertility and if he is still interested to get testosterone replacement, he will be offered if the next blood work shows testosterone less than 250.  - He  has polypharmacy including multiple supplements. I advised him to stay away from bodybuilding products including androgens, steroids, and various protein preparations. - I encouraged him to continue follow-up with his urologist regarding his ED. Testosterone replacement will not significantly alter his ED however will help with libido.  - I advised patient to maintain close follow up with Glo Herring., MD  for primary care needs. Follow up plan: Return in about 3 months (around 07/01/2016) for follow up with pre-visit labs.  Glade Lloyd, MD Phone: [phone]  Fax: [phone]   04/02/2016, 3:23 PM

## 2016-04-09 DIAGNOSIS — G629 Polyneuropathy, unspecified: Secondary | ICD-10-CM | POA: Diagnosis not present

## 2016-04-09 DIAGNOSIS — M79673 Pain in unspecified foot: Secondary | ICD-10-CM | POA: Diagnosis not present

## 2016-04-09 DIAGNOSIS — B351 Tinea unguium: Secondary | ICD-10-CM | POA: Diagnosis not present

## 2016-04-09 DIAGNOSIS — I739 Peripheral vascular disease, unspecified: Secondary | ICD-10-CM | POA: Diagnosis not present

## 2016-04-18 ENCOUNTER — Encounter: Payer: Self-pay | Admitting: Orthopaedic Surgery

## 2016-04-18 ENCOUNTER — Ambulatory Visit (INDEPENDENT_AMBULATORY_CARE_PROVIDER_SITE_OTHER): Payer: PPO | Admitting: Orthopaedic Surgery

## 2016-04-18 VITALS — BP 139/78 | HR 81 | Temp 99.1°F | Ht 68.0 in | Wt 227.0 lb

## 2016-04-18 DIAGNOSIS — M25512 Pain in left shoulder: Secondary | ICD-10-CM | POA: Diagnosis not present

## 2016-04-18 DIAGNOSIS — G8929 Other chronic pain: Secondary | ICD-10-CM

## 2016-04-18 MED ORDER — HYDROCODONE-ACETAMINOPHEN 5-325 MG PO TABS: 1.0000 | ORAL_TABLET | Freq: Four times a day (QID) | ORAL | 0 refills | Status: DC | PRN

## 2016-04-18 NOTE — Progress Notes (Signed)
Patient ZO:XWRUEA:Wesley Ho, male DOB:10/01/1955, 61 y.o. VWU:[id number]  Chief Complaint  Patient presents with  . Follow-up    left shoudler pain    HPI  Wesley Ho is a 61 y.o. male who has left shoulder pain.  He is a little better and is doing his exercises.  He has no paresthesias.  He has no new trauma. HPI  Body mass index is 34.52 kg/m.  ROS  Review of Systems  HENT: Negative for congestion.   Respiratory: Positive for cough and shortness of breath. Negative for choking.   Cardiovascular: Negative for chest pain and leg swelling.  Endocrine: Positive for cold intolerance.  Musculoskeletal: Positive for arthralgias.  Allergic/Immunologic: Positive for environmental allergies.  Psychiatric/Behavioral: The patient is nervous/anxious.     Past Medical History:  Diagnosis Date  . Anxiety   . Arthritis   . Bronchitis   . Chronic diarrhea   . Chronic edema of lower extremity 09/28/2010  . COPD (chronic obstructive pulmonary disease) (HCC)   . DVT (deep venous thrombosis) (HCC) 09/28/2010  . Emphysema of lung (HCC)   . Fatigue   . H/O Thrombocytopenia 09/28/2010  . Left radial fracture   . Multiple lung nodules 09/28/2010  . Neuropathy [facility](HCC)     Past Surgical History:  Procedure Laterality Date  . CATARACT EXTRACTION W/PHACO Right 01/17/2016   Procedure: CATARACT EXTRACTION PHACO AND INTRAOCULAR LENS PLACEMENT (IOC);  Surgeon: Jethro BolusMark Shapiro, MD;  Location: [facility];  Service: Ophthalmology;  Laterality: Right;  CDE: 4.34  . CATARACT EXTRACTION W/PHACO Left 01/31/2016   Procedure: CATARACT EXTRACTION PHACO AND INTRAOCULAR LENS PLACEMENT (IOC);  Surgeon: Jethro BolusMark Shapiro, MD;  Location: [facility];  Service: Ophthalmology;  Laterality: Left;  CDE: 5.69  . CHOLECYSTECTOMY    . MOUTH SURGERY      Family History  Problem Relation Age of Onset  . Diabetes Mother   . Clotting disorder Father     Social History Social History  Substance Use Topics  . Smoking status: Former  Smoker    Packs/day: 1.00    Years: 40.00    Quit date: 01/11/2013  . Smokeless tobacco: Never Used  . Alcohol use No     Comment: stopped drinking 2016.    No Known Allergies  Current Outpatient Prescriptions  Medication Sig Dispense Refill  . acetaminophen (TYLENOL) 500 MG tablet Take 500-1,000 mg by mouth every 6 (six) hours as needed for mild pain.    Marland Kitchen. alprazolam (XANAX) 2 MG tablet Take 2 mg by mouth 4 (four) times daily as needed. For anxiety.    Marland Kitchen. aspirin EC 81 MG tablet Take 81 mg by mouth daily.    Marland Kitchen. aspirin-sod bicarb-citric acid (ALKA-SELTZER) 325 MG TBEF tablet Take 325 mg by mouth every 6 (six) hours as needed.    . Biotin 5000 MCG TABS Take 5,000 mcg by mouth daily.    . cholecalciferol (VITAMIN D) 1000 units tablet Take 1,000 Units by mouth daily.    . ferrous sulfate (IRON SUPPLEMENT) 325 (65 FE) MG tablet Take 65 mg by mouth daily with breakfast.    . furosemide (LASIX) 20 MG tablet Take 60 mg by mouth daily.    Marland Kitchen. gabapentin (NEURONTIN) 800 MG tablet Take 800 mg by mouth 2 (two) times daily.    . hydrochlorothiazide (HYDRODIURIL) 25 MG tablet Take 25 mg by mouth daily.    Marland Kitchen. HYDROcodone-acetaminophen (NORCO/VICODIN) 5-325 MG tablet Take 1 tablet by mouth every 6 (six) hours as needed for moderate  pain (Must last 14 days.Do not take and drive a car or use machinery.). 56 tablet 0  . Multiple Vitamins-Minerals (HM COMPLETE 50+ MENS ULTIMATE PO) Take 1 tablet by mouth daily.    . Potassium 99 MG TABS Take 99 mg by mouth daily.    . sildenafil (REVATIO) 20 MG tablet Take 100 mg by mouth daily.  11  . tamoxifen (NOLVADEX) 20 MG tablet Take 20 mg by mouth daily.    Marland Kitchen UNABLE TO FIND Trimix Penile Injection Therapy    . vitamin B-12 (CYANOCOBALAMIN) 1000 MCG tablet Take 1,000 mcg by mouth daily.     No current facility-administered medications for this visit.      Physical Exam  Blood pressure 139/78, pulse 81, temperature 99.1 F (37.3 C), height 5\' 8"  (1.727 m),  weight 227 lb (103 kg).  Constitutional: overall normal hygiene, normal nutrition, well developed, normal grooming, normal body habitus. Assistive device:none  Musculoskeletal: gait and station Limp none, muscle tone and strength are normal, no tremors or atrophy is present.  .  Neurological: coordination overall normal.  Deep tendon reflex/nerve stretch intact.  Sensation normal.  Cranial nerves II-XII intact.   Skin:   Normal overall no scars, lesions, ulcers or rashes. No psoriasis.  Psychiatric: Alert and oriented x 3.  Recent memory intact, remote memory unclear.  Normal mood and affect. Well groomed.  Good eye contact.  Cardiovascular: overall no swelling, no varicosities, no edema bilaterally, normal temperatures of the legs and arms, no clubbing, cyanosis and good capillary refill.  Lymphatic: palpation is normal.  Examination of left Upper Extremity is done.  Inspection:   Overall:  Elbow non-tender without crepitus or defects, forearm non-tender without crepitus or defects, wrist non-tender without crepitus or defects, hand non-tender.    Shoulder: with glenohumeral joint tenderness, without effusion.   Upper arm: without swelling and tenderness   Range of motion:   Overall:  Full range of motion of the elbow, full range of motion of wrist and full range of motion in fingers.   Shoulder:  left  170 degrees forward flexion; 160 degrees abduction; 35 degrees internal rotation, 35 degrees external rotation, 20 degrees extension, 40 degrees adduction.   Stability:   Overall:  Shoulder, elbow and wrist stable   Strength and Tone:   Overall full shoulder muscles strength, full upper arm strength and normal upper arm bulk and tone.   The patient has been educated about the nature of the problem(s) and counseled on treatment options.  The patient appeared to understand what I have discussed and is in agreement with it.  Encounter Diagnosis  Name Primary?  . Chronic left  shoulder pain Yes    PLAN Call if any problems.  Precautions discussed.  Continue current medications.   Return to clinic 1 month   I have reviewed the [facility] Controlled Substance Reporting System web site prior to prescribing narcotic medicine for this patient.  Electronically Signed Darreld Mclean, MD 2/14/20182:41 PM

## 2016-04-25 DIAGNOSIS — N400 Enlarged prostate without lower urinary tract symptoms: Secondary | ICD-10-CM | POA: Diagnosis not present

## 2016-04-25 DIAGNOSIS — I10 Essential (primary) hypertension: Secondary | ICD-10-CM | POA: Diagnosis not present

## 2016-04-25 DIAGNOSIS — Z6839 Body mass index (BMI) 39.0-39.9, adult: Secondary | ICD-10-CM | POA: Diagnosis not present

## 2016-04-25 DIAGNOSIS — N529 Male erectile dysfunction, unspecified: Secondary | ICD-10-CM | POA: Diagnosis not present

## 2016-04-25 DIAGNOSIS — E1140 Type 2 diabetes mellitus with diabetic neuropathy, unspecified: Secondary | ICD-10-CM | POA: Diagnosis not present

## 2016-05-16 ENCOUNTER — Ambulatory Visit (INDEPENDENT_AMBULATORY_CARE_PROVIDER_SITE_OTHER): Payer: PPO | Admitting: Orthopaedic Surgery

## 2016-05-16 ENCOUNTER — Encounter: Payer: Self-pay | Admitting: Orthopaedic Surgery

## 2016-05-16 VITALS — BP 140/83 | HR 66 | Ht 68.0 in | Wt 228.0 lb

## 2016-05-16 DIAGNOSIS — G8929 Other chronic pain: Secondary | ICD-10-CM

## 2016-05-16 DIAGNOSIS — M25512 Pain in left shoulder: Secondary | ICD-10-CM | POA: Diagnosis not present

## 2016-05-16 NOTE — Progress Notes (Signed)
PROCEDURE NOTE:  The patient request injection, verbal consent was obtained.  The left shoulder was prepped appropriately after time out was performed.   Sterile technique was observed and injection of 1 cc of Depo-Medrol 40 mg with several cc's of plain xylocaine. Anesthesia was provided by ethyl chloride and a 20-gauge needle was used to inject the shoulder area. A posterior approach was used.  The injection was tolerated well.  A band aid dressing was applied.  The patient was advised to apply ice later today and tomorrow to the injection sight as needed.  Encounter Diagnosis  Name Primary?  . Chronic left shoulder pain Yes   Return in one month.  Call if any problem  Electronically Signed Darreld McleanWayne Nicklous Aburto, MD 3/14/20182:22 PM

## 2016-06-12 ENCOUNTER — Ambulatory Visit (INDEPENDENT_AMBULATORY_CARE_PROVIDER_SITE_OTHER): Payer: PPO | Admitting: Urology

## 2016-06-12 DIAGNOSIS — N5201 Erectile dysfunction due to arterial insufficiency: Secondary | ICD-10-CM | POA: Diagnosis not present

## 2016-06-12 DIAGNOSIS — E291 Testicular hypofunction: Secondary | ICD-10-CM | POA: Diagnosis not present

## 2016-06-13 ENCOUNTER — Encounter: Payer: Self-pay | Admitting: Orthopaedic Surgery

## 2016-06-13 ENCOUNTER — Ambulatory Visit: Payer: PPO | Admitting: Orthopaedic Surgery

## 2016-06-14 DIAGNOSIS — R609 Edema, unspecified: Secondary | ICD-10-CM | POA: Diagnosis not present

## 2016-06-14 DIAGNOSIS — E1140 Type 2 diabetes mellitus with diabetic neuropathy, unspecified: Secondary | ICD-10-CM | POA: Diagnosis not present

## 2016-06-14 DIAGNOSIS — F064 Anxiety disorder due to known physiological condition: Secondary | ICD-10-CM | POA: Diagnosis not present

## 2016-06-14 DIAGNOSIS — N529 Male erectile dysfunction, unspecified: Secondary | ICD-10-CM | POA: Diagnosis not present

## 2016-06-14 DIAGNOSIS — G603 Idiopathic progressive neuropathy: Secondary | ICD-10-CM | POA: Diagnosis not present

## 2016-06-14 DIAGNOSIS — E539 Vitamin B deficiency, unspecified: Secondary | ICD-10-CM | POA: Diagnosis not present

## 2016-06-14 DIAGNOSIS — G4709 Other insomnia: Secondary | ICD-10-CM | POA: Diagnosis not present

## 2016-06-18 DIAGNOSIS — I739 Peripheral vascular disease, unspecified: Secondary | ICD-10-CM | POA: Diagnosis not present

## 2016-06-18 DIAGNOSIS — M79674 Pain in right toe(s): Secondary | ICD-10-CM | POA: Diagnosis not present

## 2016-06-18 DIAGNOSIS — B351 Tinea unguium: Secondary | ICD-10-CM | POA: Diagnosis not present

## 2016-06-19 DIAGNOSIS — Z961 Presence of intraocular lens: Secondary | ICD-10-CM | POA: Diagnosis not present

## 2016-06-19 DIAGNOSIS — H04123 Dry eye syndrome of bilateral lacrimal glands: Secondary | ICD-10-CM | POA: Diagnosis not present

## 2016-06-25 ENCOUNTER — Other Ambulatory Visit: Payer: Self-pay | Admitting: "Endocrinology

## 2016-06-25 DIAGNOSIS — E291 Testicular hypofunction: Secondary | ICD-10-CM | POA: Diagnosis not present

## 2016-06-26 LAB — TESTOSTERONE TOTAL,FREE,BIO, MALES
ALBUMIN: 3.9 g/dL (ref 3.6–5.1)
SEX HORMONE BINDING: 80 nmol/L — AB (ref 22–77)
TESTOSTERONE BIOAVAILABLE: 44.3 ng/dL — AB (ref 110.0–575.0)
Testosterone, Free: 24.7 pg/mL — ABNORMAL LOW (ref 46.0–224.0)
Testosterone: 408 ng/dL (ref 250–827)

## 2016-06-27 ENCOUNTER — Encounter: Payer: Self-pay | Admitting: Orthopaedic Surgery

## 2016-06-27 ENCOUNTER — Ambulatory Visit (INDEPENDENT_AMBULATORY_CARE_PROVIDER_SITE_OTHER): Payer: PPO | Admitting: Orthopaedic Surgery

## 2016-06-27 DIAGNOSIS — M25512 Pain in left shoulder: Secondary | ICD-10-CM

## 2016-06-27 DIAGNOSIS — G8929 Other chronic pain: Secondary | ICD-10-CM | POA: Diagnosis not present

## 2016-06-27 MED ORDER — HYDROCODONE-ACETAMINOPHEN 5-325 MG PO TABS: 1.0000 | ORAL_TABLET | Freq: Four times a day (QID) | ORAL | 0 refills | Status: DC | PRN

## 2016-06-27 NOTE — Progress Notes (Signed)
PROCEDURE NOTE:  The patient request injection, verbal consent was obtained.  The left shoulder was prepped appropriately after time out was performed.   Sterile technique was observed and injection of 1 cc of Depo-Medrol 40 mg with several cc's of plain xylocaine. Anesthesia was provided by ethyl chloride and a 20-gauge needle was used to inject the shoulder area. A posterior approach was used.  The injection was tolerated well.  A band aid dressing was applied.  The patient was advised to apply ice later today and tomorrow to the injection sight as needed.  Encounter Diagnosis  Name Primary?  . Chronic left shoulder pain Yes   Return in one month.  I have reviewed the [place] Controlled Substance Reporting System web site prior to prescribing narcotic medicine for this patient.  Electronically Signed Darreld Mclean, MD 4/25/20189:26 AM

## 2016-07-02 ENCOUNTER — Encounter: Payer: Self-pay | Admitting: "Endocrinology

## 2016-07-02 ENCOUNTER — Ambulatory Visit (INDEPENDENT_AMBULATORY_CARE_PROVIDER_SITE_OTHER): Payer: PPO | Admitting: "Endocrinology

## 2016-07-02 VITALS — BP 138/84 | HR 64 | Ht 68.0 in | Wt 227.0 lb

## 2016-07-02 DIAGNOSIS — E291 Testicular hypofunction: Secondary | ICD-10-CM | POA: Diagnosis not present

## 2016-07-02 NOTE — Progress Notes (Signed)
Subjective:    Patient ID: Wesley Ho, male    DOB: 11-02-1955, PCP Cassell Smiles, MD   Past Medical History:  Diagnosis Date  . Anxiety   . Arthritis   . Bronchitis   . Chronic diarrhea   . Chronic edema of lower extremity 09/28/2010  . COPD (chronic obstructive pulmonary disease) (HCC)   . DVT (deep venous thrombosis) (HCC) 09/28/2010  . Emphysema of lung (HCC)   . Fatigue   . H/O Thrombocytopenia 09/28/2010  . Left radial fracture   . Multiple lung nodules 09/28/2010  . Neuropathy    Past Surgical History:  Procedure Laterality Date  . CATARACT EXTRACTION W/PHACO Right 01/17/2016   Procedure: CATARACT EXTRACTION PHACO AND INTRAOCULAR LENS PLACEMENT (IOC);  Surgeon: Jethro Bolus, MD;  Location: [facility];  Service: Ophthalmology;  Laterality: Right;  CDE: 4.34  . CATARACT EXTRACTION W/PHACO Left 01/31/2016   Procedure: CATARACT EXTRACTION PHACO AND INTRAOCULAR LENS PLACEMENT (IOC);  Surgeon: Jethro Bolus, MD;  Location: [facility];  Service: Ophthalmology;  Laterality: Left;  CDE: 5.69  . CHOLECYSTECTOMY    . MOUTH SURGERY     Social History   Social History  . Marital status: Divorced    Spouse name: N/A  . Number of children: N/A  . Years of education: N/A   Social History Main Topics  . Smoking status: Former Smoker    Packs/day: 1.00    Years: 40.00    Quit date: 01/11/2013  . Smokeless tobacco: Never Used  . Alcohol use No     Comment: stopped drinking 2016.  . Drug use: No  . Sexual activity: Not Currently    Birth control/ protection: None   Other Topics Concern  . None   Social History Narrative  . None   Outpatient Encounter Prescriptions as of 07/02/2016  Medication Sig  . WHEY PROTEIN PO Take by mouth 2 (two) times daily.  Marland Kitchen acetaminophen (TYLENOL) 500 MG tablet Take 500-1,000 mg by mouth every 6 (six) hours as needed for mild pain.  Marland Kitchen alprazolam (XANAX) 2 MG tablet Take 2 mg by mouth 4 (four) times daily as needed. For anxiety.  Marland Kitchen aspirin EC  81 MG tablet Take 81 mg by mouth daily.  Marland Kitchen aspirin-sod bicarb-citric acid (ALKA-SELTZER) 325 MG TBEF tablet Take 325 mg by mouth every 6 (six) hours as needed.  . Biotin 5000 MCG TABS Take 5,000 mcg by mouth daily.  . cholecalciferol (VITAMIN D) 1000 units tablet Take 1,000 Units by mouth daily.  . ferrous sulfate (IRON SUPPLEMENT) 325 (65 FE) MG tablet Take 65 mg by mouth daily with breakfast.  . furosemide (LASIX) 20 MG tablet Take 60 mg by mouth daily.  Marland Kitchen gabapentin (NEURONTIN) 800 MG tablet Take 800 mg by mouth 2 (two) times daily.  . hydrochlorothiazide (HYDRODIURIL) 25 MG tablet Take 25 mg by mouth daily.  Marland Kitchen HYDROcodone-acetaminophen (NORCO/VICODIN) 5-325 MG tablet Take 1 tablet by mouth every 6 (six) hours as needed for moderate pain (Must last 30 days.Do not take and drive a car or use machinery.).  Marland Kitchen Multiple Vitamins-Minerals (HM COMPLETE 50+ MENS ULTIMATE PO) Take 1 tablet by mouth daily.  . Potassium 99 MG TABS Take 99 mg by mouth daily.  . sildenafil (REVATIO) 20 MG tablet Take 100 mg by mouth daily.  . tamoxifen (NOLVADEX) 20 MG tablet Take 20 mg by mouth daily.  Marland Kitchen UNABLE TO FIND Trimix Penile Injection Therapy  . vitamin B-12 (CYANOCOBALAMIN) 1000 MCG tablet Take  1,000 mcg by mouth daily.   No facility-administered encounter medications on file as of 07/02/2016.    ALLERGIES: No Known Allergies VACCINATION STATUS:  There is no immunization history on file for this patient.  HPI 61 year old gentleman with multiple medical problems as above. He is being seen in f/u for hypogonadism.  - His history is complicated, he is a former bodybuilder with significant history of use of androgenous /unidentified androgens, opioids for pain control. He was diagnosed with hypogonadism approximately 4 years ago. He was being treated with testosterone injection until October 2017. On 12/19/2015 he was found to have supraphysiologic testosterone level of 1374 which prompted the discontinuation  of his medication. - Since then, too subsequent labs showed total testosterone level of 487 and 408. - On further interview patient reports that he did have shrunk testes for "years", progressive gynecomastia which forced him to consult with several surgeons and currently considering plastic surgery. -He fathers 2 grown children. His growth and development was uneventful. -He denies injury to the testicles, infections, radiation, nor chemotherapy. He denies history of significant head injury. -He has erectile dysfunction consulting with urology.  - He denies any history of sleep apnea, however he has atrial fibrillation on treatment.  Review of Systems   Constitutional: +  has steady weight, + fatigue, no subjective hyperthermia/hypothermia Eyes: no blurry vision, no xerophthalmia ENT: no sore throat, no nodules palpated in throat, no dysphagia/odynophagia, no hoarseness Cardiovascular: no CP/SOB/palpitations/leg swelling Respiratory: no cough/SOB Gastrointestinal: no N/V/D/C Musculoskeletal: no muscle/joint aches Skin: + Progressive bilateral gynecomastia, no rashes Neurological: no tremors/numbness/tingling/dizziness Psychiatric: no depression/anxiety  Objective:    BP 138/84   Pulse 64   Ht  (1.727 m)   Wt 227 lb (103 kg)   BMI 34.52 kg/m   Wt Readings from Last 3 Encounters:  07/02/16 227 lb (103 kg)  05/16/16 228 lb (103.4 kg)  04/18/16 227 lb (103 kg)    Physical Exam Constitutional: overweight, in NAD Eyes: PERRLA, EOMI, no exophthalmos ENT: moist mucous membranes, no thyromegaly, no cervical lymphadenopathy Cardiovascular: RRR, No MRG Respiratory: CTA B Gastrointestinal: abdomen soft, NT, ND, BS+ Genitourinary: Bilaterally shrunk testicles to 8-10 mL, nontender: No intrascrotal  masse lesion Musculoskeletal: no deformities, strength intact in all 4 Skin:  Significant bilateral large gynecomastia, moist, warm, no rashes Neurological: no tremor with  outstretched hands, DTR normal in all 4   Results for ROEY, COOPMAN (MRN [id number]) as of 07/02/2016 15:20  Ref. Range 03/26/2016 07:28 06/25/2016 10:48  Sex Horm Binding Glob, Serum Latest Ref Range: 22 - 77 nmol/L 64 80 (H)  Testosterone Latest Ref Range: 250 - 827 ng/dL 409 811  Testosterone, Bioavailable Latest Ref Range: 110.0 - 575.0 ng/dL 91.4 (L) 78.2 (L)  Testosterone Free Latest Ref Range: 46.0 - 224.0 pg/mL 36.8 (L) 24.7 (L)    12/19/2015 total testosterone 1374.   Assessment & Plan:   1. Hypogonadism, male - He was seen last visit for  long-standing hypogonadism of multiple etiologies. - Lab work indicates more of primary hypogonadism. -The most likely etiology seems to be suppression  of hypothalamic-pituitary-gonadal  Axis suppression by use of various bodybuilding elements including steroids, androgens, proteins, and opioids. -He has significant gynecomastia,unfortunately his gynecomastia cannot be reversed with medical therapy. His only option would be plastic surgery which is unlikely to be covered by his insurance. He understands this process and currently saving money to pay for his surgery out of pocket. - Regarding his hypogonadism: He was taken off  of  testosterone replacement for the time-due to the fact that he went supraphysiologic at 1374. -  His previously testosterone level now is 408, during last visit it was 487.   -He will not require testosterone supplement at this time. - On further discussion, he appears to be interested to keep his fertility in which case testosterone supplement will diminish this chance, and may be best to keep him off of testosterone supplement for a long time. - If he drops his plan for fertility and if he is still interested to get testosterone replacement, he will be offered if the next blood work shows testosterone less than 250.  - He  has polypharmacy including multiple supplements. I advised him to stay away from bodybuilding  products including androgens, steroids, and various protein preparations.  - I encouraged him to continue follow-up with his urologist regarding his ED. Testosterone replacement will not significantly alter his ED however will help with libido.  - I advised patient to maintain close follow up with Cassell Smiles, MD for primary care needs. Follow up plan: Return in about 4 months (around 11/01/2016) for follow up with pre-visit labs.  Marquis Lunch, MD Phone: [phone]  Fax: [phone]   07/02/2016, 4:01 PM

## 2016-07-04 DIAGNOSIS — Z6839 Body mass index (BMI) 39.0-39.9, adult: Secondary | ICD-10-CM | POA: Diagnosis not present

## 2016-07-04 DIAGNOSIS — D696 Thrombocytopenia, unspecified: Secondary | ICD-10-CM | POA: Diagnosis not present

## 2016-07-04 DIAGNOSIS — I10 Essential (primary) hypertension: Secondary | ICD-10-CM | POA: Diagnosis not present

## 2016-07-04 DIAGNOSIS — F419 Anxiety disorder, unspecified: Secondary | ICD-10-CM | POA: Diagnosis not present

## 2016-07-04 DIAGNOSIS — E669 Obesity, unspecified: Secondary | ICD-10-CM | POA: Diagnosis not present

## 2016-07-04 DIAGNOSIS — J449 Chronic obstructive pulmonary disease, unspecified: Secondary | ICD-10-CM | POA: Diagnosis not present

## 2016-07-26 ENCOUNTER — Ambulatory Visit: Payer: PPO | Admitting: Orthopaedic Surgery

## 2016-08-07 ENCOUNTER — Encounter: Payer: Self-pay | Admitting: Orthopaedic Surgery

## 2016-08-07 ENCOUNTER — Ambulatory Visit (INDEPENDENT_AMBULATORY_CARE_PROVIDER_SITE_OTHER): Payer: PPO | Admitting: Orthopaedic Surgery

## 2016-08-07 DIAGNOSIS — G8929 Other chronic pain: Secondary | ICD-10-CM | POA: Insufficient documentation

## 2016-08-07 DIAGNOSIS — M25512 Pain in left shoulder: Secondary | ICD-10-CM | POA: Diagnosis not present

## 2016-08-07 DIAGNOSIS — N5201 Erectile dysfunction due to arterial insufficiency: Secondary | ICD-10-CM | POA: Diagnosis not present

## 2016-08-07 MED ORDER — HYDROCODONE-ACETAMINOPHEN 5-325 MG PO TABS: 1.0000 | ORAL_TABLET | Freq: Four times a day (QID) | ORAL | 0 refills | Status: DC | PRN

## 2016-08-07 NOTE — Progress Notes (Signed)
PROCEDURE NOTE:  The patient request injection, verbal consent was obtained.  The left shoulder was prepped appropriately after time out was performed.   Sterile technique was observed and injection of 1 cc of Depo-Medrol 40 mg with several cc's of plain xylocaine. Anesthesia was provided by ethyl chloride and a 20-gauge needle was used to inject the shoulder area. A posterior approach was used.  The injection was tolerated well.  A band aid dressing was applied.  The patient was advised to apply ice later today and tomorrow to the injection sight as needed.  Encounter Diagnosis  Name Primary?  . Chronic left shoulder pain Yes   I have [place] the [place] Controlled Substance [place] System web site prior to prescribing narcotic medicine for this patient.  Return in six weeks.  Call if any problem.  Precautions discussed.  Electronically Signed Darreld McleanWayne Tyneshia Stivers, MD 6/5/20183:05 PM

## 2016-08-14 ENCOUNTER — Ambulatory Visit (INDEPENDENT_AMBULATORY_CARE_PROVIDER_SITE_OTHER): Payer: PPO | Admitting: Urology

## 2016-08-14 DIAGNOSIS — N5201 Erectile dysfunction due to arterial insufficiency: Secondary | ICD-10-CM | POA: Diagnosis not present

## 2016-08-14 DIAGNOSIS — E291 Testicular hypofunction: Secondary | ICD-10-CM | POA: Diagnosis not present

## 2016-08-16 DIAGNOSIS — G603 Idiopathic progressive neuropathy: Secondary | ICD-10-CM | POA: Diagnosis not present

## 2016-08-16 DIAGNOSIS — E1140 Type 2 diabetes mellitus with diabetic neuropathy, unspecified: Secondary | ICD-10-CM | POA: Diagnosis not present

## 2016-08-16 DIAGNOSIS — F064 Anxiety disorder due to known physiological condition: Secondary | ICD-10-CM | POA: Diagnosis not present

## 2016-08-16 DIAGNOSIS — R609 Edema, unspecified: Secondary | ICD-10-CM | POA: Diagnosis not present

## 2016-08-16 DIAGNOSIS — N529 Male erectile dysfunction, unspecified: Secondary | ICD-10-CM | POA: Diagnosis not present

## 2016-08-16 DIAGNOSIS — G4709 Other insomnia: Secondary | ICD-10-CM | POA: Diagnosis not present

## 2016-08-16 DIAGNOSIS — E539 Vitamin B deficiency, unspecified: Secondary | ICD-10-CM | POA: Diagnosis not present

## 2016-08-27 DIAGNOSIS — M79675 Pain in left toe(s): Secondary | ICD-10-CM | POA: Diagnosis not present

## 2016-08-27 DIAGNOSIS — I739 Peripheral vascular disease, unspecified: Secondary | ICD-10-CM | POA: Diagnosis not present

## 2016-08-27 DIAGNOSIS — B351 Tinea unguium: Secondary | ICD-10-CM | POA: Diagnosis not present

## 2016-08-27 DIAGNOSIS — M79674 Pain in right toe(s): Secondary | ICD-10-CM | POA: Diagnosis not present

## 2016-09-17 DIAGNOSIS — L600 Ingrowing nail: Secondary | ICD-10-CM | POA: Diagnosis not present

## 2016-09-18 ENCOUNTER — Encounter: Payer: Self-pay | Admitting: Orthopaedic Surgery

## 2016-09-18 ENCOUNTER — Ambulatory Visit (INDEPENDENT_AMBULATORY_CARE_PROVIDER_SITE_OTHER): Payer: PPO | Admitting: Orthopaedic Surgery

## 2016-09-18 VITALS — BP 111/73 | HR 73 | Temp 97.1°F | Ht 68.0 in | Wt 233.0 lb

## 2016-09-18 DIAGNOSIS — M25512 Pain in left shoulder: Secondary | ICD-10-CM | POA: Diagnosis not present

## 2016-09-18 DIAGNOSIS — G8929 Other chronic pain: Secondary | ICD-10-CM

## 2016-09-18 MED ORDER — HYDROCODONE-ACETAMINOPHEN 5-325 MG PO TABS: 1.0000 | ORAL_TABLET | Freq: Four times a day (QID) | ORAL | 0 refills | Status: DC | PRN

## 2016-09-18 NOTE — Progress Notes (Signed)
PROCEDURE NOTE:  The patient request injection, verbal consent was obtained.  The left shoulder was prepped appropriately after time out was performed.   Sterile technique was observed and injection of 1 cc of Depo-Medrol 40 mg with several cc's of plain xylocaine. Anesthesia was provided by ethyl chloride and a 20-gauge needle was used to inject the shoulder area. A posterior approach was used.  The injection was tolerated well.  A band aid dressing was applied.  The patient was advised to apply ice later today and tomorrow to the injection sight as needed.  Encounter Diagnosis  Name Primary?  . Chronic left shoulder pain Yes   Return in six weeks.  I have reviewed the [place] Cont[place] Substance [place] System web site prior to prescribing narcotic medicine for this patient.  Call if any problem.  Precautions discussed.   Electronically Signed Darreld McleanWayne Shreyansh Tiffany, MD 7/17/20182:55 PM

## 2016-09-19 ENCOUNTER — Telehealth: Payer: Self-pay | Admitting: Orthopaedic Surgery

## 2016-09-24 ENCOUNTER — Other Ambulatory Visit: Payer: Self-pay | Admitting: "Endocrinology

## 2016-09-24 DIAGNOSIS — E291 Testicular hypofunction: Secondary | ICD-10-CM | POA: Diagnosis not present

## 2016-09-25 LAB — TESTOSTERONE, FREE AND TOTAL (INCLUDES SHBG)-(MALES)
SEX HORMONE BINDING: 59 nmol/L (ref 22–77)
TESTOSTERONE FREE: 28.8 pg/mL — AB (ref 47.0–244.0)
TESTOSTERONE-% FREE: 1.3 % — AB (ref 1.6–2.9)
TESTOSTERONE: 222 ng/dL — AB (ref 250–827)

## 2016-10-05 DIAGNOSIS — S90811A Abrasion, right foot, initial encounter: Secondary | ICD-10-CM | POA: Diagnosis not present

## 2016-10-05 DIAGNOSIS — E669 Obesity, unspecified: Secondary | ICD-10-CM | POA: Diagnosis not present

## 2016-10-05 DIAGNOSIS — F419 Anxiety disorder, unspecified: Secondary | ICD-10-CM | POA: Diagnosis not present

## 2016-10-05 DIAGNOSIS — R201 Hypoesthesia of skin: Secondary | ICD-10-CM | POA: Diagnosis not present

## 2016-10-05 DIAGNOSIS — E119 Type 2 diabetes mellitus without complications: Secondary | ICD-10-CM | POA: Diagnosis not present

## 2016-10-05 DIAGNOSIS — Z6839 Body mass index (BMI) 39.0-39.9, adult: Secondary | ICD-10-CM | POA: Diagnosis not present

## 2016-10-05 DIAGNOSIS — S90812A Abrasion, left foot, initial encounter: Secondary | ICD-10-CM | POA: Diagnosis not present

## 2016-10-16 ENCOUNTER — Ambulatory Visit: Payer: PPO | Admitting: Orthopaedic Surgery

## 2016-10-17 ENCOUNTER — Ambulatory Visit (INDEPENDENT_AMBULATORY_CARE_PROVIDER_SITE_OTHER): Payer: PPO | Admitting: Orthopaedic Surgery

## 2016-10-17 ENCOUNTER — Encounter: Payer: Self-pay | Admitting: Orthopaedic Surgery

## 2016-10-17 VITALS — BP 129/79 | HR 64 | Temp 97.8°F | Ht 68.0 in | Wt 231.0 lb

## 2016-10-17 DIAGNOSIS — M25512 Pain in left shoulder: Secondary | ICD-10-CM | POA: Diagnosis not present

## 2016-10-17 DIAGNOSIS — G8929 Other chronic pain: Secondary | ICD-10-CM | POA: Diagnosis not present

## 2016-10-17 MED ORDER — HYDROCODONE-ACETAMINOPHEN 5-325 MG PO TABS: 1.0000 | ORAL_TABLET | Freq: Four times a day (QID) | ORAL | 0 refills | Status: DC | PRN

## 2016-10-17 NOTE — Progress Notes (Signed)
PROCEDURE NOTE:  The patient request injection, verbal consent was obtained.  The left shoulder was prepped appropriately after time out was performed.   Sterile technique was observed and injection of 1 cc of Depo-Medrol 40 mg with several cc's of plain xylocaine. Anesthesia was provided by ethyl chloride and a 20-gauge needle was used to inject the shoulder area. A posterior approach was used.  The injection was tolerated well.  A band aid dressing was applied.  The patient was advised to apply ice later today and tomorrow to the injection sight as needed.  Return in one month.  I have reviewed the [place] Cont[place] Substance [place] System web site prior to prescribing narcotic medicine for this patient.  Call if any problem.  Precautions discussed.   Electronically Signed Darreld McleanWayne Zanylah Hardie, MD 8/15/201810:18 AM

## 2016-10-23 DIAGNOSIS — H10503 Unspecified blepharoconjunctivitis, bilateral: Secondary | ICD-10-CM | POA: Diagnosis not present

## 2016-10-26 DIAGNOSIS — Z6841 Body Mass Index (BMI) 40.0 and over, adult: Secondary | ICD-10-CM | POA: Diagnosis not present

## 2016-10-26 DIAGNOSIS — R2681 Unsteadiness on feet: Secondary | ICD-10-CM | POA: Diagnosis not present

## 2016-10-26 DIAGNOSIS — E1140 Type 2 diabetes mellitus with diabetic neuropathy, unspecified: Secondary | ICD-10-CM | POA: Diagnosis not present

## 2016-10-26 DIAGNOSIS — Z1389 Encounter for screening for other disorder: Secondary | ICD-10-CM | POA: Diagnosis not present

## 2016-10-26 DIAGNOSIS — D696 Thrombocytopenia, unspecified: Secondary | ICD-10-CM | POA: Diagnosis not present

## 2016-10-26 DIAGNOSIS — E1142 Type 2 diabetes mellitus with diabetic polyneuropathy: Secondary | ICD-10-CM | POA: Diagnosis not present

## 2016-10-26 DIAGNOSIS — I10 Essential (primary) hypertension: Secondary | ICD-10-CM | POA: Diagnosis not present

## 2016-10-26 DIAGNOSIS — F419 Anxiety disorder, unspecified: Secondary | ICD-10-CM | POA: Diagnosis not present

## 2016-10-26 DIAGNOSIS — E291 Testicular hypofunction: Secondary | ICD-10-CM | POA: Diagnosis not present

## 2016-10-26 DIAGNOSIS — E669 Obesity, unspecified: Secondary | ICD-10-CM | POA: Diagnosis not present

## 2016-10-29 ENCOUNTER — Observation Stay (HOSPITAL_COMMUNITY)
Admission: EM | Admit: 2016-10-29 | Discharge: 2016-10-30 | Disposition: A | Discharge status: Home / Self Care | Payer: PPO | Attending: Internal Medicine | Admitting: Internal Medicine

## 2016-10-29 ENCOUNTER — Emergency Department (HOSPITAL_COMMUNITY): Payer: PPO

## 2016-10-29 ENCOUNTER — Encounter (HOSPITAL_COMMUNITY): Payer: Self-pay | Admitting: Emergency Medicine

## 2016-10-29 DIAGNOSIS — R2242 Localized swelling, mass and lump, left lower limb: Secondary | ICD-10-CM | POA: Diagnosis not present

## 2016-10-29 DIAGNOSIS — Y639 Failure in dosage during unspecified surgical and medical care: Secondary | ICD-10-CM | POA: Diagnosis not present

## 2016-10-29 DIAGNOSIS — T424X1S Poisoning by benzodiazepines, accidental (unintentional), sequela: Secondary | ICD-10-CM | POA: Diagnosis not present

## 2016-10-29 DIAGNOSIS — M79605 Pain in left leg: Secondary | ICD-10-CM | POA: Insufficient documentation

## 2016-10-29 DIAGNOSIS — Z87891 Personal history of nicotine dependence: Secondary | ICD-10-CM | POA: Insufficient documentation

## 2016-10-29 DIAGNOSIS — T424X1A Poisoning by benzodiazepines, accidental (unintentional), initial encounter: Secondary | ICD-10-CM | POA: Diagnosis not present

## 2016-10-29 DIAGNOSIS — Z8709 Personal history of other diseases of the respiratory system: Secondary | ICD-10-CM | POA: Diagnosis not present

## 2016-10-29 DIAGNOSIS — R29810 Facial weakness: Secondary | ICD-10-CM | POA: Diagnosis not present

## 2016-10-29 DIAGNOSIS — R269 Unspecified abnormalities of gait and mobility: Secondary | ICD-10-CM | POA: Insufficient documentation

## 2016-10-29 DIAGNOSIS — R600 Localized edema: Secondary | ICD-10-CM | POA: Diagnosis not present

## 2016-10-29 DIAGNOSIS — E876 Hypokalemia: Secondary | ICD-10-CM

## 2016-10-29 DIAGNOSIS — Z79899 Other long term (current) drug therapy: Secondary | ICD-10-CM | POA: Diagnosis not present

## 2016-10-29 DIAGNOSIS — G92 Toxic encephalopathy: Secondary | ICD-10-CM | POA: Diagnosis not present

## 2016-10-29 DIAGNOSIS — R4182 Altered mental status, unspecified: Secondary | ICD-10-CM | POA: Diagnosis not present

## 2016-10-29 DIAGNOSIS — Z86718 Personal history of other venous thrombosis and embolism: Secondary | ICD-10-CM | POA: Diagnosis not present

## 2016-10-29 DIAGNOSIS — R42 Dizziness and giddiness: Secondary | ICD-10-CM | POA: Diagnosis not present

## 2016-10-29 DIAGNOSIS — R4781 Slurred speech: Secondary | ICD-10-CM | POA: Diagnosis not present

## 2016-10-29 DIAGNOSIS — Z7982 Long term (current) use of aspirin: Secondary | ICD-10-CM | POA: Diagnosis not present

## 2016-10-29 DIAGNOSIS — J449 Chronic obstructive pulmonary disease, unspecified: Secondary | ICD-10-CM | POA: Diagnosis not present

## 2016-10-29 DIAGNOSIS — G928 Other toxic encephalopathy: Secondary | ICD-10-CM | POA: Diagnosis present

## 2016-10-29 LAB — I-STAT TROPONIN, ED: Troponin i, poc: 0 ng/mL (ref 0.00–0.08)

## 2016-10-29 LAB — URINALYSIS, ROUTINE W REFLEX MICROSCOPIC
BILIRUBIN URINE: NEGATIVE
GLUCOSE, UA: NEGATIVE mg/dL
HGB URINE DIPSTICK: NEGATIVE
Ketones, ur: NEGATIVE mg/dL
NITRITE: NEGATIVE
PH: 6 (ref 5.0–8.0)
Protein, ur: NEGATIVE mg/dL
SPECIFIC GRAVITY, URINE: 1.014 (ref 1.005–1.030)

## 2016-10-29 LAB — COMPREHENSIVE METABOLIC PANEL
ALBUMIN: 3.7 g/dL (ref 3.5–5.0)
ALK PHOS: 37 U/L — AB (ref 38–126)
ALT: 27 U/L (ref 17–63)
AST: 31 U/L (ref 15–41)
Anion gap: 11 (ref 5–15)
BILIRUBIN TOTAL: 0.5 mg/dL (ref 0.3–1.2)
BUN: 13 mg/dL (ref 6–20)
CALCIUM: 8.2 mg/dL — AB (ref 8.9–10.3)
CO2: 36 mmol/L — ABNORMAL HIGH (ref 22–32)
Chloride: 91 mmol/L — ABNORMAL LOW (ref 101–111)
Creatinine, Ser: 0.97 mg/dL (ref 0.61–1.24)
GFR calc Af Amer: >60 mL/min (ref >60)
GLUCOSE: 129 mg/dL — AB (ref 65–99)
Potassium: 2.8 mmol/L — ABNORMAL LOW (ref 3.5–5.1)
Sodium: 138 mmol/L (ref 135–145)
TOTAL PROTEIN: 7.4 g/dL (ref 6.5–8.1)

## 2016-10-29 LAB — CBC
HCT: 39.3 % (ref 39.0–52.0)
Hemoglobin: 13.2 g/dL (ref 13.0–17.0)
MCH: 33.8 pg (ref 26.0–34.0)
MCHC: 33.6 g/dL (ref 30.0–36.0)
MCV: 100.8 fL — AB (ref 78.0–100.0)
Platelets: 117 10*3/uL — ABNORMAL LOW (ref 150–400)
RBC: 3.9 MIL/uL — ABNORMAL LOW (ref 4.22–5.81)
RDW: 13.6 % (ref 11.5–15.5)
WBC: 11.3 10*3/uL — ABNORMAL HIGH (ref 4.0–10.5)

## 2016-10-29 LAB — I-STAT CHEM 8, ED
BUN: 11 mg/dL (ref 6–20)
CALCIUM ION: 0.92 mmol/L — AB (ref 1.15–1.40)
Chloride: 90 mmol/L — ABNORMAL LOW (ref 101–111)
Creatinine, Ser: 1 mg/dL (ref 0.61–1.24)
GLUCOSE: 126 mg/dL — AB (ref 65–99)
HCT: 40 % (ref 39.0–52.0)
HEMOGLOBIN: 13.6 g/dL (ref 13.0–17.0)
Potassium: 2.9 mmol/L — ABNORMAL LOW (ref 3.5–5.1)
SODIUM: 138 mmol/L (ref 135–145)
TCO2: 37 mmol/L — ABNORMAL HIGH (ref 22–32)

## 2016-10-29 LAB — RAPID URINE DRUG SCREEN, HOSP PERFORMED
Amphetamines: NOT DETECTED
BARBITURATES: NOT DETECTED
Benzodiazepines: POSITIVE — AB
Cocaine: NOT DETECTED
Opiates: POSITIVE — AB
TETRAHYDROCANNABINOL: NOT DETECTED

## 2016-10-29 LAB — GLUCOSE, CAPILLARY: Glucose-Capillary: 149 mg/dL — ABNORMAL HIGH (ref 65–99)

## 2016-10-29 LAB — DIFFERENTIAL
BASOS ABS: 0 10*3/uL (ref 0.0–0.1)
Basophils Relative: 0 %
EOS PCT: 4 %
Eosinophils Absolute: 0.4 10*3/uL (ref 0.0–0.7)
LYMPHS ABS: 2.7 10*3/uL (ref 0.7–4.0)
LYMPHS PCT: 24 %
MONOS PCT: 6 %
Monocytes Absolute: 0.7 10*3/uL (ref 0.1–1.0)
NEUTROS PCT: 66 %
Neutro Abs: 7.5 10*3/uL (ref 1.7–7.7)

## 2016-10-29 LAB — ETHANOL

## 2016-10-29 LAB — PROTIME-INR
INR: 0.94
PROTHROMBIN TIME: 12.6 s (ref 11.4–15.2)

## 2016-10-29 LAB — APTT: aPTT: 30 seconds (ref 24–36)

## 2016-10-29 MED ORDER — POTASSIUM CHLORIDE CRYS ER 20 MEQ PO TBCR
40.0000 meq | EXTENDED_RELEASE_TABLET | Freq: Once | ORAL | Status: AC
  Administered 2016-10-29: 40 meq via ORAL
  Filled 2016-10-29: qty 2

## 2016-10-29 MED ORDER — BUSPIRONE HCL 5 MG PO TABS
10.0000 mg | ORAL_TABLET | Freq: Two times a day (BID) | ORAL | Status: DC
  Administered 2016-10-29 – 2016-10-30 (×2): 10 mg via ORAL
  Filled 2016-10-29 (×2): qty 2

## 2016-10-29 MED ORDER — ACETAMINOPHEN 500 MG PO TABS: 500.0000 mg | ORAL_TABLET | Freq: Four times a day (QID) | ORAL | Status: DC | PRN

## 2016-10-29 MED ORDER — SILDENAFIL CITRATE 20 MG PO TABS: 100.0000 mg | ORAL_TABLET | Freq: Every day | ORAL | Status: DC

## 2016-10-29 MED ORDER — ALPRAZOLAM 0.5 MG PO TABS
0.5000 mg | ORAL_TABLET | Freq: Three times a day (TID) | ORAL | Status: DC | PRN
  Administered 2016-10-30 (×2): 0.5 mg via ORAL
  Filled 2016-10-29 (×2): qty 1

## 2016-10-29 MED ORDER — POTASSIUM CHLORIDE 10 MEQ/100ML IV SOLN
10.0000 meq | Freq: Once | INTRAVENOUS | Status: AC
Start: 2016-10-29 — End: 2016-10-29
  Administered 2016-10-29: 10 meq via INTRAVENOUS
  Filled 2016-10-29 (×2): qty 100

## 2016-10-29 MED ORDER — VITAMIN B-12 1000 MCG PO TABS
1000.0000 ug | ORAL_TABLET | Freq: Every day | ORAL | Status: DC
  Administered 2016-10-30: 1000 ug via ORAL
  Filled 2016-10-29: qty 1

## 2016-10-29 MED ORDER — ASPIRIN EC 81 MG PO TBEC
81.0000 mg | DELAYED_RELEASE_TABLET | Freq: Every day | ORAL | Status: DC
  Administered 2016-10-30: 81 mg via ORAL
  Filled 2016-10-29: qty 1

## 2016-10-29 MED ORDER — ENOXAPARIN SODIUM 40 MG/0.4ML ~~LOC~~ SOLN
40.0000 mg | SUBCUTANEOUS | Status: DC
  Administered 2016-10-29: 40 mg via SUBCUTANEOUS
  Filled 2016-10-29: qty 0.4

## 2016-10-29 MED ORDER — TAMOXIFEN CITRATE 10 MG PO TABS
20.0000 mg | ORAL_TABLET | Freq: Every day | ORAL | Status: DC
  Administered 2016-10-30: 20 mg via ORAL
  Filled 2016-10-29: qty 2

## 2016-10-29 MED ORDER — TAMOXIFEN CITRATE 20 MG PO TABS: 20.0000 mg | ORAL_TABLET | Freq: Every day | ORAL | Status: DC

## 2016-10-29 MED ORDER — ALPRAZOLAM 0.5 MG PO TABS: 0.5000 mg | ORAL_TABLET | Freq: Four times a day (QID) | ORAL | Status: DC | PRN

## 2016-10-29 NOTE — ED Provider Notes (Signed)
[facility] Provider Note   CSN: [id number] Arrival date & time: 10/29/16  5284     History   Chief Complaint Chief Complaint  Patient presents with  . Dizziness    HPI Wesley Ho is a 61 y.o. male.  Patient sent in from Dr. Georgann Housekeeper office concern for stroke. Patient with abnormal gait dizziness not necessary to vertigo and right facial droop. The walking difficulties have been present for several days. Patient also with cramps in his left leg. Said that when that occurred before that was related to a blood clot in his leg. Patient not on blood thinners currently.      Past Medical History:  Diagnosis Date  . Anxiety   . Arthritis   . Bronchitis   . Chronic diarrhea   . Chronic edema of lower extremity 09/28/2010  . COPD (chronic obstructive pulmonary disease) (HCC)   . DVT (deep venous thrombosis) (HCC) 09/28/2010  . Emphysema of lung (HCC)   . Fatigue   . H/O Thrombocytopenia 09/28/2010  . Left radial fracture   . Multiple lung nodules 09/28/2010  . Neuropathy     Patient Active Problem List   Diagnosis Date Noted  . Chronic left shoulder pain 08/07/2016  . Gynecomastia 02/24/2016  . Hypogonadism, male 02/23/2016  . Hyperglycemia 07/08/2014  . COPD (chronic obstructive pulmonary disease) (HCC)   . Dizziness 07/07/2014  . Generalized weakness 07/07/2014  . ETOH abuse 07/07/2014  . Macrocytic anemia 07/07/2014  . Left radial fracture 07/07/2014  . Anxiety 07/07/2014  . Diarrhea   . Acute bronchitis 03/16/2012  . Acute sinusitis 03/16/2012  . COPD exacerbation (HCC) 03/16/2012  . Alcohol consumption heavy 03/16/2012  . Tobacco abuse 03/16/2012  . Macrocytosis without anemia 03/16/2012  . Leg pain, bilateral 03/16/2012  . Personal history of DVT (deep vein thrombosis) 03/16/2012  . Hyponatremia 03/16/2012  . DVT (deep venous thrombosis) (HCC) 09/28/2010  . Thrombocytopenia (HCC) 09/28/2010  . Chronic edema of lower extremity 09/28/2010  .  Multiple lung nodules 09/28/2010    Past Surgical History:  Procedure Laterality Date  . CATARACT EXTRACTION W/PHACO Right 01/17/2016   Procedure: CATARACT EXTRACTION PHACO AND INTRAOCULAR LENS PLACEMENT (IOC);  Surgeon: Jethro Bolus, MD;  Location: [facility];  Service: Ophthalmology;  Laterality: Right;  CDE: 4.34  . CATARACT EXTRACTION W/PHACO Left 01/31/2016   Procedure: CATARACT EXTRACTION PHACO AND INTRAOCULAR LENS PLACEMENT (IOC);  Surgeon: Jethro Bolus, MD;  Location: [facility];  Service: Ophthalmology;  Laterality: Left;  CDE: 5.69  . CHOLECYSTECTOMY    . MOUTH SURGERY         Home Medications    Prior to Admission medications   Medication Sig Start Date End Date Taking? Authorizing Provider  acetaminophen (TYLENOL) 500 MG tablet Take 500-1,000 mg by mouth every 6 (six) hours as needed for mild pain.   Yes [provider]  alprazolam Prudy Feeler) 2 MG tablet Take 2 mg by mouth 4 (four) times daily as needed. For anxiety. 12/20/15  Yes [provider]  aspirin EC 81 MG tablet Take 81 mg by mouth daily.   Yes [provider]  aspirin-sod bicarb-citric acid (ALKA-SELTZER) 325 MG TBEF tablet Take 325 mg by mouth every 6 (six) hours as needed. 01/31/16  Yes [provider]  Biotin 5000 MCG TABS Take 5,000 mcg by mouth daily.   Yes [provider]  cholecalciferol (VITAMIN D) 1000 units tablet Take 1,000 Units by mouth daily.   Yes [provider]  ferrous  sulfate (IRON SUPPLEMENT) 325 (65 FE) MG tablet Take 65 mg by mouth daily with breakfast.   Yes [provider]  furosemide (LASIX) 20 MG tablet Take 60 mg by mouth daily.   Yes [provider]  gabapentin (NEURONTIN) 800 MG tablet Take 800 mg by mouth 2 (two) times daily. 12/19/15  Yes [provider]  hydrochlorothiazide (HYDRODIURIL) 25 MG tablet Take 25 mg by mouth daily.   Yes [provider]  HYDROcodone-acetaminophen (NORCO/VICODIN) 5-325 MG tablet  Take 1 tablet by mouth every 6 (six) hours as needed for moderate pain (Must last 30 days.Do not take and drive a car or use machinery.). 10/17/16  Yes Darreld Mclean, MD  Multiple Vitamins-Minerals (HM COMPLETE 50+ MENS ULTIMATE PO) Take 1 tablet by mouth daily.   Yes [provider]  Potassium 99 MG TABS Take 99 mg by mouth daily.   Yes [provider]  sildenafil (REVATIO) 20 MG tablet Take 100 mg by mouth daily. 12/23/15  Yes [provider]  tamoxifen (NOLVADEX) 20 MG tablet Take 20 mg by mouth daily. 12/19/15  Yes [provider]  UNABLE TO FIND Trimix Penile Injection Therapy   Yes [provider]  vitamin B-12 (CYANOCOBALAMIN) 1000 MCG tablet Take 1,000 mcg by mouth daily.   Yes [provider]  WHEY PROTEIN PO Take by mouth 2 (two) times daily.   Yes [provider]    Family History Family History  Problem Relation Age of Onset  . Diabetes Mother   . Clotting disorder Father     Social History Social History  Substance Use Topics  . Smoking status: Former Smoker    Packs/day: 1.00    Years: 40.00    Quit date: 01/11/2013  . Smokeless tobacco: Never Used  . Alcohol use No     Comment: stopped drinking 2016.     Allergies   Patient has no known allergies.   Review of Systems Review of Systems  Constitutional: Negative for fever.  HENT: Negative for congestion.   Eyes: Negative for visual disturbance.  Respiratory: Negative for shortness of breath.   Gastrointestinal: Negative for abdominal pain.  Genitourinary: Negative for dysuria.  Musculoskeletal: Negative for back pain.  Skin: Negative for rash.  Neurological: Positive for facial asymmetry. Negative for syncope.  Hematological: Does not bruise/bleed easily.  Psychiatric/Behavioral: Negative for confusion.     Physical Exam Updated Vital Signs BP 105/67   Pulse 61   Temp 98.4 F (36.9 C) (Oral)   Resp 20   SpO2 100%   Physical Exam    Constitutional: He is oriented to person, place, and time. He appears well-developed and well-nourished. No distress.  HENT:  Head: Normocephalic and atraumatic.  Mouth/Throat: Oropharynx is clear and moist.  Eyes: Pupils are equal, round, and reactive to light. Conjunctivae and EOM are normal.  Neck: Neck supple.  Pulmonary/Chest: Effort normal and breath sounds normal.  Abdominal: Soft. Bowel sounds are normal.  Musculoskeletal: Normal range of motion. He exhibits edema.  Increased swelling to left leg.  Neurological: He is alert and oriented to person, place, and time. No cranial nerve deficit or sensory deficit. He exhibits normal muscle tone. Coordination normal.  Right-sided facial droop.  Skin: Skin is warm.  Nursing note and vitals reviewed.    ED Treatments / Results  Labs (all labs ordered are listed, but only abnormal results are displayed) Labs Reviewed  CBC - Abnormal; Notable for the following:  Result Value   WBC 11.3 (*)    RBC 3.90 (*)    MCV 100.8 (*)    Platelets 117 (*)    All other components within normal limits  COMPREHENSIVE METABOLIC PANEL - Abnormal; Notable for the following:    Potassium 2.8 (*)    Chloride 91 (*)    CO2 36 (*)    Glucose, Bld 129 (*)    Calcium 8.2 (*)    Alkaline Phosphatase 37 (*)    All other components within normal limits  RAPID URINE DRUG SCREEN, HOSP PERFORMED - Abnormal; Notable for the following:    Opiates POSITIVE (*)    Benzodiazepines POSITIVE (*)    All other components within normal limits  URINALYSIS, ROUTINE W REFLEX MICROSCOPIC - Abnormal; Notable for the following:    Leukocytes, UA TRACE (*)    Bacteria, UA RARE (*)    Squamous Epithelial / LPF 0-5 (*)    All other components within normal limits  I-STAT CHEM 8, ED - Abnormal; Notable for the following:    Potassium 2.9 (*)    Chloride 90 (*)    Glucose, Bld 126 (*)    Calcium, Ion 0.92 (*)    TCO2 37 (*)    All other components within normal  limits  ETHANOL  PROTIME-INR  APTT  DIFFERENTIAL  I-STAT TROPONIN, ED    EKG  EKG Interpretation  Date/Time:  Monday October 29 2016 08:44:47 EDT Ventricular Rate:  71 PR Interval:    QRS Duration: 105 QT Interval:  428 QTC Calculation: 466 R Axis:   70 Text Interpretation:  Sinus rhythm Low voltage, precordial leads No significant change since last tracing Confirmed by Vanetta Mulders [phone]) on 10/29/2016 8:51:28 AM       Radiology Ct Head Wo Contrast  Result Date: 10/29/2016 CLINICAL DATA:  Altered mental status and dizziness for 2 weeks. EXAM: CT HEAD WITHOUT CONTRAST TECHNIQUE: Contiguous axial images were obtained from the base of the skull through the vertex without intravenous contrast. COMPARISON:  CT head dated Jul 07, 2014. FINDINGS: Brain: No evidence of acute infarction, hemorrhage, hydrocephalus, extra-axial collection or mass lesion/mass effect. Age-related cerebral atrophy with compensatory dilatation of the ventricles. Vascular: Atherosclerotic vascular calcification of the carotid siphons. No hyperdense vessel. Skull: Normal. Negative for fracture or focal lesion. Sinuses/Orbits: Small left maxillary sinus mucous retention cysts, unchanged. The remaining paranasal sinuses and bilateral mastoid air cells are clear. Bilateral pseudophakia.  The orbits are otherwise unremarkable. Other: None. IMPRESSION: 1. No acute intracranial abnormality. Normal for age noncontrast head CT. Electronically Signed   By: Obie Dredge M.D.   On: 10/29/2016 10:09   Mr Brain Wo Contrast (neuro Protocol)  Result Date: 10/29/2016 CLINICAL DATA:  Dizziness beginning 3 days ago. Ataxia. Focal neuro deficits greater than 6 hours. Stroke suspected. Fall today. EXAM: MRI HEAD WITHOUT CONTRAST TECHNIQUE: Multiplanar, multiecho pulse sequences of the brain and surrounding structures were obtained without intravenous contrast. COMPARISON:  None. FINDINGS: Brain: The diffusion-weighted images  demonstrate no acute or subacute infarction. No acute hemorrhage or mass lesion is present. Generalized atrophy is mildly advanced for age. No significant white matter disease is present. The internal auditory canals are normal bilaterally. The brainstem and cerebellum are normal. Vascular: Flow is present in the major intracranial arteries. Skull and upper cervical spine: The skullbase is within normal limits. The craniocervical junction is normal. Marrow signal is unremarkable. Midline sagittal structures are within normal limits. Sinuses/Orbits: The paranasal sinuses and mastoid air  cells are clear. Bilateral lens replacements are present. The globes and orbits are within normal limits bilaterally. IMPRESSION: 1. Generalized atrophy is mildly advanced for age. 2. No acute intracranial abnormality. Electronically Signed   By: Marin Roberts M.D.   On: 10/29/2016 12:44   US Venous Img Lower  Left (dvt Study)  Result Date: 10/29/2016 CLINICAL DATA:  Left lower extremity edema. History of previous episodes of deep venous thrombosis. The patient reports falling 3 days ago. EXAM: Left LOWER EXTREMITY VENOUS DOPPLER ULTRASOUND TECHNIQUE: Gray-scale sonography with graded compression, as well as color Doppler and duplex ultrasound were performed to evaluate the lower extremity deep venous systems from the level of the common femoral vein and including the common femoral, femoral, profunda femoral, popliteal and calf veins including the posterior tibial, peroneal and gastrocnemius veins when visible. The superficial great saphenous vein was also interrogated. Spectral Doppler was utilized to evaluate flow at rest and with distal augmentation maneuvers in the common femoral, femoral and popliteal veins. COMPARISON:  Lower extremity Doppler ultrasound of March 17, 2012 FINDINGS: Contralateral Common Femoral Vein: Respiratory phasicity is normal and symmetric with the symptomatic side. No evidence of thrombus.  Normal compressibility. Common Femoral Vein: No evidence of thrombus. Normal compressibility, respiratory phasicity and response to augmentation. Saphenofemoral Junction: No evidence of thrombus. Normal compressibility and flow on color Doppler imaging. Profunda Femoral Vein: No evidence of thrombus. Normal compressibility and flow on color Doppler imaging. Femoral Vein: No evidence of thrombus. Normal compressibility, respiratory phasicity and response to augmentation. Popliteal Vein: No evidence of thrombus. Normal compressibility, respiratory phasicity and response to augmentation. Calf Veins: No evidence of thrombus. Normal compressibility and flow on color Doppler imaging. Superficial Great Saphenous Vein: No evidence of thrombus. Normal compressibility and flow on color Doppler imaging. Venous Reflux:  None. Other Findings:  None. IMPRESSION: No evidence of DVT within the left lower extremity. Electronically Signed   By: David  [place] M.D.   On: 10/29/2016 10:54    Procedures Procedures (including critical care time)  CRITICAL CARE Performed by: Vanetta Mulders Total critical care time: 30 minutes Critical care time was exclusive of separately billable procedures and treating other patients. Critical care was necessary to treat or prevent imminent or life-threatening deterioration. Critical care was time spent personally by me on the following activities: development of treatment plan with patient and/or surrogate as well as nursing, discussions with consultants, evaluation of patient's response to treatment, examination of patient, obtaining history from patient or surrogate, ordering and performing treatments and interventions, ordering and review of laboratory studies, ordering and review of radiographic studies, pulse oximetry and re-evaluation of patient's condition.   Medications Ordered in ED Medications  potassium chloride 10 mEq in 100 mL IVPB (0 mEq Intravenous Stopped 10/29/16 1100)    potassium chloride SA (K-DUR,KLOR-CON) CR tablet 40 mEq (40 mEq Oral Given 10/29/16 0951)     Initial Impression / Assessment and Plan / ED Course  I have reviewed the triage vital signs and the nursing notes.  Pertinent labs & imaging results that were available during my care of the patient were reviewed by me and considered in my medical decision making (see chart for details).    Patient with extensive workup to rule out stroke. CT head negative MRI brain negative. Patient's dizziness and right facial droop without explanation at this point in time. However early on it was noted that he had significant hypokalemia with a potassium of 2.8. Patient received IV potassium due to the level being  below 3. Also received oral potassium. Patient will require admission for the hypokalemia. Discussed with hospitalist they will admit.   Final Clinical Impressions(s) / ED Diagnoses   Final diagnoses:  Dizziness  Hypokalemia  Facial droop    New Prescriptions New Prescriptions   No medications on file     Vanetta Mulders, MD 10/29/16 1418

## 2016-10-29 NOTE — H&P (Signed)
History and Physical    Wesley BOMMARITO ZOX:[id number] DOB: [birth year] DOA: 10/29/2016  PCP: Elfredia Nevins, MD Patient coming from: PCP office > ED  Chief Complaint: abnormal gait and possible R facial droop  HPI: 61 y.o. male with a history of COPD, DVT, and multiple lung nodules who presented to his PCP's office w/ gait instability and dizziness for 3 days.  When it was felt that he was exhibiting slurring of his speech and a R facial droop, he was referred to the ED for a stroke evaluation.      In the ED his stroke evaluation was negative, to include a CT head and MRI brain.  He was noted to have a K+ of 2.8.    When I examined the patient he has no focal neurologic deficits appreciable but his speech is slow and deliberate and somewhat slurred at times.  He appears to be having trouble staying awake.  I asked him if he takes any sedating medications and he admits that he has been abusing/overusing his Xanax prescription.  He denies use of any other illicit substances.  He admits to feeling very lethargic in general, sleeping a lot, eating little, and feeling extremely overwhelmed with social stressors in his life when he is not sedated.  Review of Systems: As per HPI otherwise 10 point review of systems negative.   Past Medical History:  Diagnosis Date  . Anxiety   . Arthritis   . Bronchitis   . Chronic diarrhea   . Chronic edema of lower extremity 09/28/2010  . COPD (chronic obstructive pulmonary disease) (HCC)   . DVT (deep venous thrombosis) (HCC) 09/28/2010  . Emphysema of lung (HCC)   . Fatigue   . H/O Thrombocytopenia 09/28/2010  . Left radial fracture   . Multiple lung nodules 09/28/2010  . Neuropathy     Past Surgical History:  Procedure Laterality Date  . CATARACT EXTRACTION W/PHACO Right 01/17/2016   Procedure: CATARACT EXTRACTION PHACO AND INTRAOCULAR LENS PLACEMENT (IOC);  Surgeon: Jethro Bolus, MD;  Location: [facility];  Service: Ophthalmology;  Laterality: Right;   CDE: 4.34  . CATARACT EXTRACTION W/PHACO Left 01/31/2016   Procedure: CATARACT EXTRACTION PHACO AND INTRAOCULAR LENS PLACEMENT (IOC);  Surgeon: Jethro Bolus, MD;  Location: [facility];  Service: Ophthalmology;  Laterality: Left;  CDE: 5.69  . CHOLECYSTECTOMY    . MOUTH SURGERY      Family History  Family History  Problem Relation Age of Onset  . Diabetes Mother   . Clotting disorder Father     Social History   reports that he quit smoking about 3 years ago. He has a 40.00 pack-year smoking history. He has never used smokeless tobacco. He reports that he does not drink alcohol or use drugs.  The patient lives alone in [place].  He tells me he is on medical disability because of his COPD.  Allergies No Known Allergies  Prior to Admission medications   Medication Sig Start Date End Date Taking? Authorizing Provider  acetaminophen (TYLENOL) 500 MG tablet Take 500-1,000 mg by mouth every 6 (six) hours as needed for mild pain.   Yes [provider]  alprazolam Prudy Feeler) 2 MG tablet Take 2 mg by mouth 4 (four) times daily as needed. For anxiety. 12/20/15  Yes [provider]  aspirin EC 81 MG tablet Take 81 mg by mouth daily.   Yes [provider]  aspirin-sod bicarb-citric acid (ALKA-SELTZER) 325 MG TBEF tablet Take 325 mg by mouth  every 6 (six) hours as needed. 01/31/16  Yes [provider]  Biotin 5000 MCG TABS Take 5,000 mcg by mouth daily.   Yes [provider]  busPIRone (BUSPAR) 10 MG tablet Take 10 mg by mouth 2 (two) times daily.    Yes [provider]  cholecalciferol (VITAMIN D) 1000 units tablet Take 1,000 Units by mouth daily.   Yes [provider]  ferrous sulfate (IRON SUPPLEMENT) 325 (65 FE) MG tablet Take 65 mg by mouth daily with breakfast.   Yes [provider]  furosemide (LASIX) 20 MG tablet Take 60 mg by mouth daily.   Yes [provider]  hydrochlorothiazide (HYDRODIURIL) 25 MG  tablet Take 25 mg by mouth daily.   Yes [provider]  HYDROcodone-acetaminophen (NORCO/VICODIN) 5-325 MG tablet Take 1 tablet by mouth every 6 (six) hours as needed for moderate pain (Must last 30 days.Do not take and drive a car or use machinery.). 10/17/16  Yes Darreld Mclean, MD  Multiple Vitamins-Minerals (HM COMPLETE 50+ MENS ULTIMATE PO) Take 1 tablet by mouth daily.   Yes [provider]  Potassium 99 MG TABS Take 99 mg by mouth daily.   Yes [provider]  tamoxifen (NOLVADEX) 20 MG tablet Take 20 mg by mouth daily. 12/19/15  Yes [provider]  vitamin B-12 (CYANOCOBALAMIN) 1000 MCG tablet Take 1,000 mcg by mouth daily.   Yes [provider]  WHEY PROTEIN PO Take by mouth 2 (two) times daily.   Yes [provider]  gabapentin (NEURONTIN) 800 MG tablet Take 800 mg by mouth 2 (two) times daily. 12/19/15   [provider]  sildenafil (REVATIO) 20 MG tablet Take 100 mg by mouth daily. 12/23/15   [provider]  UNABLE TO FIND Trimix Penile Injection Therapy    [provider]    Physical Exam: Vitals:   10/29/16 1030 10/29/16 1100 10/29/16 1303 10/29/16 1330  BP: 102/71 112/68 105/67 114/67  Pulse: 61 63 61 63  Resp: 12 13 20 19   Temp:      TempSrc:      SpO2:  97% 100% 97%    Constitutional: Very lethargic but alert and oriented Eyes: PERRL, lids and conjunctivae normal ENMT: Mucous membranes are moist. Posterior pharynx clear of any exudate or lesions.Normal dentition.  Neck: normal, supple, no masses, no thyromegaly Respiratory: clear to auscultation bilaterally, no wheezing, no crackles. Normal respiratory effort. No accessory muscle use.  Cardiovascular: Regular rate and rhythm, no murmurs / rubs / gallops. No extremity edema. 2+ pedal pulses. No carotid bruits.  Abdomen: no tenderness, no masses palpated. No hepatosplenomegaly. Bowel sounds positive.  Musculoskeletal: no clubbing /  cyanosis ROM, no contractures. Normal muscle tone.  Neurologic: CN 2-12 grossly intact. Sensation intact, DTR normal. Strength 5/5 in all 4.  No focal deficits though general slowing appreciable. Psychiatric: Normal judgment and insight.  Sedate   Labs on Admission:   CBC:  Recent Labs Lab 10/29/16 0835 10/29/16 0848  WBC 11.3*  --   NEUTROABS 7.5  --   HGB 13.2 13.6  HCT 39.3 40.0  MCV 100.8*  --   PLT 117*  --    Basic Metabolic Panel:  Recent Labs Lab 10/29/16 0835 10/29/16 0848  NA 138 138  K 2.8* 2.9*  CL 91* 90*  CO2 36*  --   GLUCOSE 129* 126*  BUN 13 11  CREATININE 0.97 1.00  CALCIUM 8.2*  --    GFR: Estimated Creatinine Clearance:  89.9 mL/min (by C-G formula based on SCr of 1 mg/dL).   Liver Function Tests:  Recent Labs Lab 10/29/16 0835  AST 31  ALT 27  ALKPHOS 37*  BILITOT 0.5  PROT 7.4  ALBUMIN 3.7   Coagulation Profile:  Recent Labs Lab 10/29/16 0835  INR 0.94   Urine analysis:    Component Value Date/Time   COLORURINE YELLOW 10/29/2016 0839   APPEARANCEUR CLEAR 10/29/2016 0839   LABSPEC 1.014 10/29/2016 0839   PHURINE 6.0 10/29/2016 0839   GLUCOSEU NEGATIVE 10/29/2016 0839   HGBUR NEGATIVE 10/29/2016 0839   BILIRUBINUR NEGATIVE 10/29/2016 0839   KETONESUR NEGATIVE 10/29/2016 0839   PROTEINUR NEGATIVE 10/29/2016 0839   UROBILINOGEN 0.2 07/08/2014 1203   NITRITE NEGATIVE 10/29/2016 0839   LEUKOCYTESUR TRACE (A) 10/29/2016 0839    Radiological Exams on Admission: Ct Head Wo Contrast  Result Date: 10/29/2016  IMPRESSION: 1. No acute intracranial abnormality. Normal for age noncontrast head CT. Electronically Signed   By: Obie Dredge M.D.   On: 10/29/2016 10:09   Mr Brain Wo Contrast (neuro Protocol)  Result Date: 10/29/2016 IMPRESSION: 1. Generalized atrophy is mildly advanced for age. 2. No acute intracranial abnormality. Electronically Signed   By: Marin Roberts M.D.   On: 10/29/2016 12:44   US Venous Img Lower   Left (dvt Study)  Result Date: 10/29/2016 IMPRESSION: No evidence of DVT within the left lower extremity. Electronically Signed   By: David  [place] M.D.   On: 10/29/2016 10:54    EKG: Independently reviewed. Sinus at 71bpm - no acute ST/T wave changes.    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 and I suspect his symptoms are all related to this - I will provide a low dose scheduled dose of benzodiazepine to avoid withdrawal but avoid further escalation of his dosing - check B-12 and folic acid - check ammonia - follow in absence of excessive benzo dosing  Hypokalemia Likely simply due to poor oral intake in the setting of intoxication from benzodiazepines as well as use of diuretic - supplement and follow - check magnesium  COPD  Well compensated at this time   Hx of DVT No acute DVT on doppler obtained in ED  Hx of multiple lung nodules  Will need to investigate the extent of his prior w/u   DVT prophylaxis: lovenox  Code Status: FULL CODE  Family Communication: No family present Disposition Plan: admit  Consults called: none   Lonia Blood, MD [facility] Office  [phone] Pager - Text Page per Amion as per below:  On-Call/Text Page:      [email or web address]      password [email or web address]  If 7PM-7AM, please contact night-coverage [email or web address] Password [email or web address] 10/29/2016, 3:20 PM

## 2016-10-29 NOTE — Progress Notes (Signed)
Patient removed 2 "diamond" stud earrings from left ear. I placed both pair in ziplock bag with patient name and room number on bag, handed back to patient. Faith Little witnessed.

## 2016-10-29 NOTE — ED Notes (Addendum)
Pt placed on 2L 02 [place] for 02 sats 90% while sleeping. EDP notified.

## 2016-10-29 NOTE — ED Triage Notes (Signed)
Pt c/o dizziness that started Friday, 3 days ago. Has gotten worse. Pt states bent over early this am and fell. A/o. Doesn't remember if he hurt anything or not. Family in room states it sounds like he is lsurring. Mild slurring noted.

## 2016-10-29 NOTE — ED Notes (Signed)
CRITICAL VALUE ALERT  Critical Value:  K 2.8  Date & Time Notied:  10/29/16, read from chart, no call   Provider Notified:Dr. Deretha Emory  Orders Received/Actions taken: none

## 2016-10-29 NOTE — ED Notes (Signed)
Primary RN in with pt. edp aware 

## 2016-10-29 NOTE — ED Notes (Signed)
Dr. Deretha Emory notified that pt speech is more slurred at this time.

## 2016-10-29 NOTE — ED Notes (Signed)
Pt taken to ct 

## 2016-10-30 DIAGNOSIS — R42 Dizziness and giddiness: Secondary | ICD-10-CM

## 2016-10-30 DIAGNOSIS — G92 Toxic encephalopathy: Secondary | ICD-10-CM | POA: Diagnosis not present

## 2016-10-30 DIAGNOSIS — T424X1S Poisoning by benzodiazepines, accidental (unintentional), sequela: Secondary | ICD-10-CM

## 2016-10-30 DIAGNOSIS — E876 Hypokalemia: Secondary | ICD-10-CM | POA: Diagnosis not present

## 2016-10-30 DIAGNOSIS — R29810 Facial weakness: Secondary | ICD-10-CM | POA: Diagnosis not present

## 2016-10-30 LAB — COMPREHENSIVE METABOLIC PANEL
ALT: 23 U/L (ref 17–63)
ANION GAP: 6 (ref 5–15)
AST: 28 U/L (ref 15–41)
Albumin: 3.3 g/dL — ABNORMAL LOW (ref 3.5–5.0)
Alkaline Phosphatase: 35 U/L — ABNORMAL LOW (ref 38–126)
BUN: 13 mg/dL (ref 6–20)
CHLORIDE: 98 mmol/L — AB (ref 101–111)
CO2: 33 mmol/L — ABNORMAL HIGH (ref 22–32)
Calcium: 8.3 mg/dL — ABNORMAL LOW (ref 8.9–10.3)
Creatinine, Ser: 0.88 mg/dL (ref 0.61–1.24)
Glucose, Bld: 128 mg/dL — ABNORMAL HIGH (ref 65–99)
POTASSIUM: 3.8 mmol/L (ref 3.5–5.1)
Sodium: 137 mmol/L (ref 135–145)
Total Bilirubin: 0.7 mg/dL (ref 0.3–1.2)
Total Protein: 7.2 g/dL (ref 6.5–8.1)

## 2016-10-30 LAB — CBC
HEMATOCRIT: 43 % (ref 39.0–52.0)
HEMOGLOBIN: 14.3 g/dL (ref 13.0–17.0)
MCH: 34 pg (ref 26.0–34.0)
MCHC: 33.3 g/dL (ref 30.0–36.0)
MCV: 102.4 fL — AB (ref 78.0–100.0)
Platelets: 112 10*3/uL — ABNORMAL LOW (ref 150–400)
RBC: 4.2 MIL/uL — AB (ref 4.22–5.81)
RDW: 13.8 % (ref 11.5–15.5)
WBC: 9.6 10*3/uL (ref 4.0–10.5)

## 2016-10-30 LAB — FOLATE: FOLATE: 20.5 ng/mL (ref >5.9)

## 2016-10-30 LAB — TSH: TSH: 0.904 u[IU]/mL (ref 0.350–4.500)

## 2016-10-30 LAB — MAGNESIUM: Magnesium: 2.2 mg/dL (ref 1.7–2.4)

## 2016-10-30 LAB — VITAMIN B12: VITAMIN B 12: 1994 pg/mL — AB (ref 180–914)

## 2016-10-30 MED ORDER — ALPRAZOLAM 2 MG PO TABS: 1.0000 mg | ORAL_TABLET | Freq: Four times a day (QID) | ORAL | 0 refills | Status: DC | PRN

## 2016-10-30 MED ORDER — GABAPENTIN 800 MG PO TABS: 400.0000 mg | ORAL_TABLET | Freq: Two times a day (BID) | ORAL | Status: DC

## 2016-10-30 NOTE — Discharge Instructions (Signed)
Benzodiazepine Overdose  Benzodiazepines are prescription medicines that decrease the activity of (depress) the central nervous system and cause changes in certain brain chemicals (neurotransmitters). These are the most commonly prescribed benzodiazepines:  Alprazolam.  Lorazepam.  Clonazepam.  Diazepam.  Temazepam.  A benzodiazepine overdose happens when you take too much of your medicine. The effects of an overdose can be mild, dangerous, or even deadly. Benzodiazepine overdose is a medical emergency. What are the causes? This condition may be caused by:  Taking too much of a medicine by accident.  Taking too much of a medicine on purpose.  An error made by a health care provider who prescribes a medicine.  An error made by the pharmacist who fills the prescription order.  What increases the risk? This condition is more likely in:  Children. They may be attracted to colorful pills. Because of a child's small size, even a small amount of a medicine can be dangerous.  Elderly people. They may be taking many different medicines. Elderly people may have difficulty reading labels or remembering when they last took their medicine.  People who use: ? Illegal drugs. ? Other substances, including alcohol, while taking benzodiazepines.  People who have: ? A history of drug or alcohol abuse. ? Certain mental health conditions. ? Breathing problems. ? Liver problems.  What are the signs or symptoms? Symptoms of this condition depend on the type of medicine and the amount that was taken. Symptoms may include:  Drowsiness.  Confusion.  Lack of energy.  Slurred speech.  Clumsiness.  Muscle weakness.  Dizziness.  Slow breathing.  Coma.  Death from a benzodiazepine overdose is rare. Death is more likely if benzodiazepines are taken at the same time as other central nervous system depressants, such as alcohol. How is this diagnosed? This condition is diagnosed based  on your symptoms. It is important to tell your health care provider:  All of the medicines that you took.  When you took the medicines.  Whether you have been drinking alcohol or using other substances.  Your health care provider will do a physical exam. This exam may include:  Checking and monitoring your heart rate and rhythm, your temperature, and your blood pressure (vital signs).  Checking your breathing and oxygen level.  You may also have blood tests or urine tests. How is this treated? Supporting your vital signs and your breathing is the first step in treating a benzodiazepine overdose. Treatment may also include:  Giving fluids and minerals (electrolytes) through an IV tube.  Inserting a breathing tube (endotracheal tube) in your airway to help you breathe.  Passing a tube through your nose and into your stomach (NG tube, or nasogastric tube) to wash out your stomach.  Giving medicines that: ? Increase your blood pressure. ? Reverse the effects of the benzodiazepine (flumazenil). ? Absorb any benzodiazepine that is in your digestive system. This treatment is rare.  Ongoing counseling and mental health support if you intentionally overdosed or used an illegal drug.  Follow these instructions at home:  Take over-the-counter and prescription medicines only as told by your health care provider. Always ask your health care provider about possible side effects and interactions of any new medicine that you start taking.  Keep a list of all of the medicines that you take, including over-the-counter medicines. Bring this list with you to all of your medical visits.  Drink enough fluid to keep your urine clear or pale yellow.  Keep all follow-up visits as told by  your health care provider. This is important. °How is this prevented? °· Get help if you are struggling with: °? Alcohol or drug use. °? Depression or another mental health problem. °· Keep the phone number of your  local poison control center near your phone or on your cell phone. °· Store all medicines in safety containers that are out of the reach of children. °· Read the drug inserts that come with your medicines. °· Do not drink alcohol when taking benzodiazepines. °· Do not use illegal drugs. °· Do not take benzodiazepines that are not prescribed for you. °Contact a health care provider if: °· Your symptoms return. °· You develop new symptoms or side effects when you take medicines. °Get help right away if: °· You think that you or someone else may have taken too much of a benzodiazepine. The hotline of the National Poison Control Center is (800) 222-1222. °· You or someone else is having symptoms of a benzodiazepine overdose. °· You have serious thoughts about hurting yourself or others. °· You have: °? Difficulty breathing. °? A loss of consciousness. °? A seizure. °· You feel dizzy all the time. °· You feel weak or you faint. °Benzodiazepine overdose is an emergency. Do not wait to see if the symptoms will go away. Get medical help right away. Call your local emergency services (911 in the [place] Do not drive yourself to the hospital. °This information is not intended to replace advice given to you by your health care provider. Make sure you discuss any questions you have with your health care provider. °Document Released: 03/29/2004 Document Revised: 07/28/2015 Document Reviewed: 08/11/2014 °Elsevier Interactive Patient Education © 2018 [employer] ° °

## 2016-10-30 NOTE — Progress Notes (Signed)
Pt IV removed, WNL. D/C instructions given to pt. Verbalized understanding. Pt family member present to take home.  

## 2016-10-30 NOTE — Clinical Social Work Note (Signed)
Clinical Social Work Assessment  Patient Details  Name: Wesley Ho MRN: [id number] Date of Birth: 1955-08-18  Date of referral:  10/30/16               Reason for consult:  Rule-out Psychosocial, [employer]                Permission sought to share information with:  Case Estate manager/land agent granted to share information::     Name::        Agency::     Relationship::     Contact Information:     Housing/Transportation Living arrangements for the past 2 months:  Single Family Home Source of Information:  Patient, Medical Team, Case Manager Patient Interpreter Needed:  None Criminal Activity/Legal Involvement Pertinent to Current Situation/Hospitalization:  No - Comment as needed Significant Relationships:  Adult Children, Other Family Members Lives with:  Self Do you feel safe going back to the place where you live?  Yes Need for family participation in patient care:  No (Coment)  Care giving concerns:  Patient denies any care giving concerns at this time.  Reports he is active in the community being part of the fitness club, tanning, and taking care of his mother.  Patient reports he has used a walker and cane in the past and has been to SNF in the past (2016 to [facility]).  Reports he drives himself to appointments and is independent at baseline.    Social Worker assessment / plan:  LCSW was consulted for "misuse of xanax, increased stress with psycho-social issues".  Patient was seen per his request this afternoon to discuss his dissatisfaction with his care during this admission.  Reports he feels like no one is actively checking on him, cleaning his room, or assisting him when he calls.  He reports he loves [employer] [facility] and is very grateful for the care in the community, but this visit has been poor.  LCSW allowed time for patient to process and provided solution focused interventions with contacting MD, asking house keeping to come in and change the linens, updating RN,  and discussing options at discharge to verbalize his concerns with patient experience.  Patient was appreciative and accepting.  LCSW also discussed consult as listed above and reason for SW intervention.  Explained and educated patient on role in hospital.  Patient denied misuse of medication and explained his dosage, his chronic issues with anxiety, and limited stressors.  He does report he lost his brother in April unexpectedly and has had some issues with his children, but they are starting to resolve.  He reports his ex-wife is also supportive and lives beside him at home and they get along very well.  Reports some stress involves taking care of his mother and working to get her into long term care.  He reports he eats properly, hydrates regularly and takes care of himself consistently.   Patient declines issues with medication and feels they are working for him.  He declines any other resources such as formalized therapy or a psychiatric referral. Patient is hopeful to return home this evening if possible.   No other needs voiced by patient.  Employment status:  Disabled (Comment on whether or not currently receiving Disability) (Copd, anxiety) Insurance information:  Managed Care PT Recommendations:  Not assessed at this time Information / Referral to community resources:     Patient/Family's Response to care:  Patient voices frustration with lack of care in his opinion.  Patient/Family's  Understanding of and Emotional Response to Diagnosis, Current Treatment, and Prognosis:  Patient understands his potassium was reason for admission and he is resolving the issue. He is wanting to get home and return to his life if not going to be treated for anything in the hospital.  Emotional Assessment Appearance:  Appears stated age Attitude/Demeanor/Rapport:    Affect (typically observed):  Accepting, Adaptable, Pleasant Orientation:  Oriented to Self, Oriented to Place, Oriented to  Time, Oriented  to Situation Alcohol / Substance use:  Not Applicable Psych involvement (Current and /or in the community):  Yes (Comment) (medication management: anxiety)  Discharge Needs  Concerns to be addressed:  Mental Health Concerns Readmission within the last 30 days:  No Current discharge risk:  None Barriers to Discharge:  No Barriers Identified   Raye Sorrow, LCSW 10/30/2016, 3:19 PM

## 2016-10-30 NOTE — Discharge Summary (Addendum)
DISCHARGE SUMMARY  Wesley Ho  MR#: [id number]  DOB:16-Nov-1955  Date of Admission: 10/29/2016 Date of Discharge: 10/30/2016  Attending Physician:Tashiba Timoney T  Patient's OZD:GUYQI, Lyman Bishop, MD  Consults:  none  Disposition: D/C home   Follow-up Appts: Follow-up Information    Elfredia Nevins, MD Follow up in 1 week(s).   Specialty:  Internal Medicine Contact information: [address] [place] [address] [phone]           Discharge Diagnoses: Accidental benzo OD  Hypokalemia COPD  Hx of DVT Hx of multiple lung nodules   Initial presentation: 61 y.o. male with a history of COPD, DVT, and multiple lung nodules who presented to his PCP's office w/ gait instability and dizziness for 3 days.  When it was felt that he was exhibiting slurring of his speech and a R facial droop, he was referred to the ED for a stroke evaluation.      In the ED his stroke evaluation was negative, to include a CT head and MRI brain.  He was noted to have a K+ of 2.8.    I asked him a the time of my admit evaluation if he takes any sedating medications and he admitted that he had been overusing his Xanax prescription.  He denied use of any other illicit substances.  He admitted to feeling very lethargic in general, sleeping a lot, eating little, and feeling extremely overwhelmed with social stressors in his life.  Hospital Course:  Accidental Xanax OD > Dizziness - gait instability - slurring of speech - lethargy No evidence of focal deficit on physical exam - extensive workup in ED including CT and MRI without evidence of CVA - history taken directly from the patient himself revealed that the patient was taking extra doses of his benzodiazepine prescription - the patient rapidly improved to his baseline in the absence of high-dose benzodiazepines - upon regaining his baseline mental status he attempted to backtrack and minimize the history of benzo overuse he had freely  admitted to the day prior - I do suspect an accidental benzo overdose/overuse was to blame for his symptoms as it fits clinically - on the day of his discharge he is back to his baseline and ambulating without any significant difficulty whatsoever - his speech is clear and he is alert and oriented - he is counseled on absolute need to avoid any extra doses of benzodiazepines and to avoid their concomitant use with other sedatives to include alcohol or antihistamines - his Xanax dose and his Neurontin dose were decreased at the time of his d/c - he was NOT provided w/ prescriptions for benzos or narcotics   Hypokalemia Likely simply due to poor oral intake in the setting of intoxication from benzodiazepines as well as use of diuretic - corrected with supplementation - magnesium normal  COPD  Well compensated during this hospital stay   Hx of DVT No acute DVT on doppler obtained in ED  Hx of multiple lung nodules  Follow with his outpatient physician   Allergies as of 10/30/2016   No Known Allergies     Medication List    STOP taking these medications   sildenafil 20 MG tablet Commonly known as:  REVATIO   UNABLE TO FIND     TAKE these medications   acetaminophen 500 MG tablet Commonly known as:  TYLENOL Take 500-1,000 mg by mouth every 6 (six) hours as needed for mild pain.   alprazolam 2 MG tablet Commonly known as:  Prudy Feeler  Take 0.5 tablets (1 mg total) by mouth 4 (four) times daily as needed. For anxiety. What changed:  how much to take   aspirin EC 81 MG tablet Take 81 mg by mouth daily.   aspirin-sod bicarb-citric acid 325 MG Tbef tablet Commonly known as:  ALKA-SELTZER Take 325 mg by mouth every 6 (six) hours as needed.   Biotin 5000 MCG Tabs Take 5,000 mcg by mouth daily.   busPIRone 10 MG tablet Commonly known as:  BUSPAR Take 10 mg by mouth 2 (two) times daily.   cholecalciferol 1000 units tablet Commonly known as:  VITAMIN D Take 1,000 Units by mouth  daily.   furosemide 20 MG tablet Commonly known as:  LASIX Take 60 mg by mouth daily.   gabapentin 800 MG tablet Commonly known as:  NEURONTIN Take 0.5 tablets (400 mg total) by mouth 2 (two) times daily. What changed:  how much to take   HM COMPLETE 50+ MENS ULTIMATE PO Take 1 tablet by mouth daily.   hydrochlorothiazide 25 MG tablet Commonly known as:  HYDRODIURIL Take 25 mg by mouth daily.   HYDROcodone-acetaminophen 5-325 MG tablet Commonly known as:  NORCO/VICODIN Take 1 tablet by mouth every 6 (six) hours as needed for moderate pain (Must last 30 days.Do not take and drive a car or use machinery.).   IRON SUPPLEMENT 325 (65 FE) MG tablet Generic drug:  ferrous sulfate Take 65 mg by mouth daily with breakfast.   Potassium 99 MG Tabs Take 99 mg by mouth daily.   tamoxifen 20 MG tablet Commonly known as:  NOLVADEX Take 20 mg by mouth daily.   vitamin B-12 1000 MCG tablet Commonly known as:  CYANOCOBALAMIN Take 1,000 mcg by mouth daily.   WHEY PROTEIN PO Take by mouth 2 (two) times daily.            Discharge Care Instructions        Start     Ordered   10/30/16 0000  Increase activity slowly     10/30/16 1532   10/30/16 0000  gabapentin (NEURONTIN) 800 MG tablet  2 times daily     10/30/16 1532   10/30/16 0000  alprazolam (XANAX) 2 MG tablet  4 times daily PRN     10/30/16 1532      Day of Discharge BP 116/61 (BP Location: Left Arm)   Pulse (!) 59   Temp 98.1 F (36.7 C) (Oral)   Resp 16   Ht 5\' 8"  (1.727 m)   Wt 105.2 kg (232 lb)   SpO2 96%   BMI 35.28 kg/m   Physical Exam: General: No acute respiratory distress Lungs: Clear to auscultation bilaterally without wheezes or crackles Cardiovascular: Regular rate and rhythm without murmur gallop or rub normal S1 and S2 Abdomen: Nontender, nondistended, soft, bowel sounds positive, no rebound, no ascites, no appreciable mass Extremities: No significant cyanosis, clubbing, or edema bilateral  lower extremities  Basic Metabolic Panel:  Recent Labs Lab 10/29/16 0835 10/29/16 0848 10/30/16 0734  NA 138 138 137  K 2.8* 2.9* 3.8  CL 91* 90* 98*  CO2 36*  --  33*  GLUCOSE 129* 126* 128*  BUN 13 11 13   CREATININE 0.97 1.00 0.88  CALCIUM 8.2*  --  8.3*  MG  --   --  2.2    Liver Function Tests:  Recent Labs Lab 10/29/16 0835 10/30/16 0734  AST 31 28  ALT 27 23  ALKPHOS 37* 35*  BILITOT 0.5 0.7  PROT 7.4 7.2  ALBUMIN 3.7 3.3*   Coags:  Recent Labs Lab 10/29/16 0835  INR 0.94   CBC:  Recent Labs Lab 10/29/16 0835 10/29/16 0848 10/30/16 0734  WBC 11.3*  --  9.6  NEUTROABS 7.5  --   --   HGB 13.2 13.6 14.3  HCT 39.3 40.0 43.0  MCV 100.8*  --  102.4*  PLT 117*  --  112*    CBG:  Recent Labs Lab 10/29/16 2146  GLUCAP 149*    Time spent in discharge (includes decision making & examination of pt): 30 minutes  10/30/2016, 3:34 PM   Lonia Blood, MD [facility] Office  [phone] Pager [phone]  On-Call/Text Page:      [email or web address]      password [facility]

## 2016-10-30 NOTE — Care Management Note (Signed)
Case Management Note  Patient Details  Name: Wesley Ho MRN: [id number] Date of Birth: [birth year]  Subjective/Objective:  Adm with hypokalemia, stroke workup. Stroke workup negative. ?  Xanax abuse, CSW consulted. Patient alert and oriented now, reports independence PTA. Has cane and RW if needed but hasn't used in awhile. He has PCP, still drives to appointments. Reports no issues affording medications.                   Action/Plan: Anticipate DC home with self care. No CM needs known or communicated.   Expected Discharge Date:       10/31/2016           Expected Discharge Plan:  Home/Self Care  In-House Referral:  Clinical Social Work  Discharge planning Services  CM Consult  Post Acute Care Choice:  NA Choice offered to:  NA  DME Arranged:    DME Agency:     HH Arranged:    HH Agency:     Status of Service:  Completed, signed off  If discussed at [employer], dates discussed:    Additional Comments:  Shironda Kain, Chrystine Oiler, RN 10/30/2016, 11:16 AM

## 2016-10-30 NOTE — Care Management Obs Status (Signed)
MEDICARE OBSERVATION STATUS NOTIFICATION   Patient Details  Name: MORRY BAZIL MRN: [id number] Date of Birth: 1955/06/01   Medicare Observation Status Notification Given:  Yes    Jannie Doyle, Chrystine Oiler, RN 10/30/2016, 11:16 AM

## 2016-10-31 LAB — HIV ANTIBODY (ROUTINE TESTING W REFLEX): HIV SCREEN 4TH GENERATION: NONREACTIVE

## 2016-11-01 ENCOUNTER — Encounter: Payer: Self-pay | Admitting: "Endocrinology

## 2016-11-01 ENCOUNTER — Ambulatory Visit: Payer: PPO | Admitting: "Endocrinology

## 2016-11-12 DIAGNOSIS — M79674 Pain in right toe(s): Secondary | ICD-10-CM | POA: Diagnosis not present

## 2016-11-12 DIAGNOSIS — I739 Peripheral vascular disease, unspecified: Secondary | ICD-10-CM | POA: Diagnosis not present

## 2016-11-12 DIAGNOSIS — B351 Tinea unguium: Secondary | ICD-10-CM | POA: Diagnosis not present

## 2016-11-12 DIAGNOSIS — M79675 Pain in left toe(s): Secondary | ICD-10-CM | POA: Diagnosis not present

## 2016-11-14 ENCOUNTER — Ambulatory Visit (INDEPENDENT_AMBULATORY_CARE_PROVIDER_SITE_OTHER): Payer: PPO | Admitting: "Endocrinology

## 2016-11-14 ENCOUNTER — Encounter: Payer: Self-pay | Admitting: "Endocrinology

## 2016-11-14 ENCOUNTER — Ambulatory Visit (INDEPENDENT_AMBULATORY_CARE_PROVIDER_SITE_OTHER): Payer: PPO | Admitting: Orthopaedic Surgery

## 2016-11-14 ENCOUNTER — Encounter: Payer: Self-pay | Admitting: Orthopaedic Surgery

## 2016-11-14 VITALS — BP 112/74 | HR 70 | Temp 96.8°F | Ht 68.0 in | Wt 239.0 lb

## 2016-11-14 VITALS — BP 133/85 | HR 66 | Ht 68.0 in | Wt 239.0 lb

## 2016-11-14 DIAGNOSIS — G8929 Other chronic pain: Secondary | ICD-10-CM

## 2016-11-14 DIAGNOSIS — E291 Testicular hypofunction: Secondary | ICD-10-CM

## 2016-11-14 DIAGNOSIS — M25512 Pain in left shoulder: Secondary | ICD-10-CM | POA: Diagnosis not present

## 2016-11-14 MED ORDER — TESTOSTERONE 20.25 MG/ACT (1.62%) TD GEL: 20.2500 mg | Freq: Every morning | TRANSDERMAL | 0 refills | Status: DC

## 2016-11-14 MED ORDER — HYDROCODONE-ACETAMINOPHEN 7.5-325 MG PO TABS: ORAL_TABLET | ORAL | 0 refills | Status: DC

## 2016-11-14 NOTE — Progress Notes (Signed)
Patient Wesley Ho:TRUE Wesley Ho, male DOB:04/19/1955, 61 y.o. VWU:[id number]  Chief Complaint  Patient presents with  . Shoulder Pain    left      HPI  Wesley Ho is a 61 y.o. male who has chronic pain of the left shoulder.  He has had vertigo recently and fell and hurt his chest on the left.  He has increased pain.  His shoulder is still painful with overhead use.  He has no numbness. HPI  Body mass index is 36.34 kg/m.  ROS  Review of Systems  HENT: Negative for congestion.   Respiratory: Positive for cough and shortness of breath. Negative for choking.   Cardiovascular: Negative for chest pain and leg swelling.  Endocrine: Positive for cold intolerance.  Musculoskeletal: Positive for arthralgias.  Allergic/Immunologic: Positive for environmental allergies.  Psychiatric/Behavioral: The patient is nervous/anxious.     Past Medical History:  Diagnosis Date  . Anxiety   . Arthritis   . Bronchitis   . Chronic diarrhea   . Chronic edema of lower extremity 09/28/2010  . COPD (chronic obstructive pulmonary disease) (HCC)   . DVT (deep venous thrombosis) (HCC) 09/28/2010  . Emphysema of lung (HCC)   . Fatigue   . H/O Thrombocytopenia 09/28/2010  . Left radial fracture   . Multiple lung nodules 09/28/2010  . Neuropathy     Past Surgical History:  Procedure Laterality Date  . CATARACT EXTRACTION W/PHACO Right 01/17/2016   Procedure: CATARACT EXTRACTION PHACO AND INTRAOCULAR LENS PLACEMENT (IOC);  Surgeon: Jethro BolusMark Shapiro, MD;  Location: [facility];  Service: Ophthalmology;  Laterality: Right;  CDE: 4.34  . CATARACT EXTRACTION W/PHACO Left 01/31/2016   Procedure: CATARACT EXTRACTION PHACO AND INTRAOCULAR LENS PLACEMENT (IOC);  Surgeon: Jethro BolusMark Shapiro, MD;  Location: [facility];  Service: Ophthalmology;  Laterality: Left;  CDE: 5.69  . CHOLECYSTECTOMY    . MOUTH SURGERY      Family History  Problem Relation Age of Onset  . Diabetes Mother   . Clotting disorder Father     Social  History Social History  Substance Use Topics  . Smoking status: Former Smoker    Packs/day: 1.00    Years: 40.00    Quit date: 01/11/2013  . Smokeless tobacco: Never Used  . Alcohol use No     Comment: stopped drinking 2016.    No Known Allergies  Current Outpatient Prescriptions  Medication Sig Dispense Refill  . pregabalin (LYRICA) 200 MG capsule Take 200 mg by mouth 3 (three) times daily.    Marland Kitchen. acetaminophen (TYLENOL) 500 MG tablet Take 500-1,000 mg by mouth every 6 (six) hours as needed for mild pain.    Marland Kitchen. alprazolam (XANAX) 2 MG tablet Take 0.5 tablets (1 mg total) by mouth 4 (four) times daily as needed. For anxiety. 1 tablet 0  . aspirin EC 81 MG tablet Take 81 mg by mouth daily.    Marland Kitchen. aspirin-sod bicarb-citric acid (ALKA-SELTZER) 325 MG TBEF tablet Take 325 mg by mouth every 6 (six) hours as needed.    . Biotin 5000 MCG TABS Take 5,000 mcg by mouth daily.    . busPIRone (BUSPAR) 10 MG tablet Take 10 mg by mouth 2 (two) times daily.     . cholecalciferol (VITAMIN D) 1000 units tablet Take 1,000 Units by mouth daily.    . ferrous sulfate (IRON SUPPLEMENT) 325 (65 FE) MG tablet Take 65 mg by mouth daily with breakfast.    . furosemide (LASIX) 20 MG tablet Take 60 mg by mouth  daily.    . gabapentin (NEURONTIN) 800 MG tablet Take 0.5 tablets (400 mg total) by mouth 2 (two) times daily. (Patient not taking: Reported on 11/14/2016)    . hydrochlorothiazide (HYDRODIURIL) 25 MG tablet Take 25 mg by mouth daily.    Marland Kitchen HYDROcodone-acetaminophen (NORCO) 7.5-325 MG tablet One every six hours for pain as needed.  Do not drive car or operate machinery while taking this medicine.  Must last 14 days. 56 tablet 0  . Multiple Vitamins-Minerals (HM COMPLETE 50+ MENS ULTIMATE PO) Take 1 tablet by mouth daily.    . Potassium 99 MG TABS Take 99 mg by mouth daily.    . tamoxifen (NOLVADEX) 20 MG tablet Take 20 mg by mouth daily.    . vitamin B-12 (CYANOCOBALAMIN) 1000 MCG tablet Take 1,000 mcg by mouth  daily.    . WHEY PROTEIN PO Take by mouth 2 (two) times daily.     No current facility-administered medications for this visit.      Physical Exam  Blood pressure 112/74, pulse 70, temperature (!) 96.8 F (36 C), height  (1.727 m), weight 239 lb (108.4 kg).  Constitutional: overall normal hygiene, normal nutrition, well developed, normal grooming, normal body habitus. Assistive device:none  Musculoskeletal: gait and station Limp none, muscle tone and strength are normal, no tremors or atrophy is present.  .  Neurological: coordination overall normal.  Deep tendon reflex/nerve stretch intact.  Sensation normal.  Cranial nerves II-XII intact.   Skin:   Normal overall no scars, lesions, ulcers or rashes. No psoriasis.  Psychiatric: Alert and oriented x 3.  Recent memory intact, remote memory unclear.  Normal mood and affect. Well groomed.  Good eye contact.  Cardiovascular: overall no swelling, no varicosities, no edema bilaterally, normal temperatures of the legs and arms, no clubbing, cyanosis and good capillary refill.  Lymphatic: palpation is normal.  All other systems reviewed and are negative   Examination of left Upper Extremity is done.  Inspection:   Overall:  Elbow non-tender without crepitus or defects, forearm non-tender without crepitus or defects, wrist non-tender without crepitus or defects, hand non-tender.    Shoulder: with glenohumeral joint tenderness, without effusion.   Upper arm: without swelling and tenderness   Range of motion:   Overall:  Full range of motion of the elbow, full range of motion of wrist and full range of motion in fingers.   Shoulder:  left  150 degrees forward flexion; 120 degrees abduction; 30 degrees internal rotation, 30 degrees external rotation, 15 degrees extension, 35 degrees adduction.   Stability:   Overall:  Shoulder, elbow and wrist stable   Strength and Tone:   Overall full shoulder muscles strength, full upper arm  strength and normal upper arm bulk and tone.  The patient has been educated about the nature of the problem(s) and counseled on treatment options.  The patient appeared to understand what I have discussed and is in agreement with it.  No diagnosis found.  PLAN Call if any problems.  Precautions discussed.  Continue current medications.   Return to clinic 1 month   I have reviewed the [facility] Controlled Substance Reporting System web site prior to prescribing narcotic medicine for this patient.  Electronically Signed Darreld Mclean, MD 9/12/201810:44 AM

## 2016-11-14 NOTE — Progress Notes (Signed)
Subjective:    Patient ID: Wesley Ho, male    DOB: 1955-12-08, PCP Elfredia Nevins, MD   Past Medical History:  Diagnosis Date  . Anxiety   . Arthritis   . Bronchitis   . Chronic diarrhea   . Chronic edema of lower extremity 09/28/2010  . COPD (chronic obstructive pulmonary disease) (HCC)   . DVT (deep venous thrombosis) (HCC) 09/28/2010  . Emphysema of lung (HCC)   . Fatigue   . H/O Thrombocytopenia 09/28/2010  . Left radial fracture   . Multiple lung nodules 09/28/2010  . Neuropathy    Past Surgical History:  Procedure Laterality Date  . CATARACT EXTRACTION W/PHACO Right 01/17/2016   Procedure: CATARACT EXTRACTION PHACO AND INTRAOCULAR LENS PLACEMENT (IOC);  Surgeon: Jethro Bolus, MD;  Location: [facility];  Service: Ophthalmology;  Laterality: Right;  CDE: 4.34  . CATARACT EXTRACTION W/PHACO Left 01/31/2016   Procedure: CATARACT EXTRACTION PHACO AND INTRAOCULAR LENS PLACEMENT (IOC);  Surgeon: Jethro Bolus, MD;  Location: [facility];  Service: Ophthalmology;  Laterality: Left;  CDE: 5.69  . CHOLECYSTECTOMY    . MOUTH SURGERY     Social History   Social History  . Marital status: Divorced    Spouse name: N/A  . Number of children: N/A  . Years of education: N/A   Social History Main Topics  . Smoking status: Former Smoker    Packs/day: 1.00    Years: 40.00    Quit date: 01/11/2013  . Smokeless tobacco: Never Used  . Alcohol use No     Comment: stopped drinking 2016.  . Drug use: No  . Sexual activity: Not Currently    Birth control/ protection: None   Other Topics Concern  . None   Social History Narrative  . None   Outpatient Encounter Prescriptions as of 11/14/2016  Medication Sig  . acetaminophen (TYLENOL) 500 MG tablet Take 500-1,000 mg by mouth every 6 (six) hours as needed for mild pain.  Marland Kitchen alprazolam (XANAX) 2 MG tablet Take 0.5 tablets (1 mg total) by mouth 4 (four) times daily as needed. For anxiety.  Marland Kitchen aspirin EC 81 MG tablet Take 81 mg by mouth daily.   Marland Kitchen aspirin-sod bicarb-citric acid (ALKA-SELTZER) 325 MG TBEF tablet Take 325 mg by mouth every 6 (six) hours as needed.  . Biotin 5000 MCG TABS Take 5,000 mcg by mouth daily.  . busPIRone (BUSPAR) 10 MG tablet Take 10 mg by mouth 2 (two) times daily.   . cholecalciferol (VITAMIN D) 1000 units tablet Take 1,000 Units by mouth daily.  . ferrous sulfate (IRON SUPPLEMENT) 325 (65 FE) MG tablet Take 65 mg by mouth daily with breakfast.  . furosemide (LASIX) 20 MG tablet Take 60 mg by mouth daily.  Marland Kitchen gabapentin (NEURONTIN) 800 MG tablet Take 0.5 tablets (400 mg total) by mouth 2 (two) times daily. (Patient not taking: Reported on 11/14/2016)  . hydrochlorothiazide (HYDRODIURIL) 25 MG tablet Take 25 mg by mouth daily.  Marland Kitchen HYDROcodone-acetaminophen (NORCO) 7.5-325 MG tablet One every six hours for pain as needed.  Do not drive car or operate machinery while taking this medicine.  Must last 14 days.  . Multiple Vitamins-Minerals (HM COMPLETE 50+ MENS ULTIMATE PO) Take 1 tablet by mouth daily.  . Potassium 99 MG TABS Take 99 mg by mouth daily.  . pregabalin (LYRICA) 200 MG capsule Take 200 mg by mouth 3 (three) times daily.  . tamoxifen (NOLVADEX) 20 MG tablet Take 20 mg by mouth daily.  . Testosterone  20.25 MG/ACT (1.62%) GEL Place 20.25 mg onto the skin every morning.  . WHEY PROTEIN PO Take by mouth 2 (two) times daily.  . [DISCONTINUED] Testosterone 20.25 MG/ACT (1.62%) GEL Place 20.25 mg onto the skin every morning.  . [DISCONTINUED] vitamin B-12 (CYANOCOBALAMIN) 1000 MCG tablet Take 1,000 mcg by mouth daily.   No facility-administered encounter medications on file as of 11/14/2016.    ALLERGIES: No Known Allergies VACCINATION STATUS:  There is no immunization history on file for this patient.  HPI 61 year old gentleman with multiple medical problems as above. He is being seen in f/u for hypogonadism.  - His history is complicated, he is a former bodybuilder with significant history of use of  androgenous /unidentified androgens, opioids for pain control. He was diagnosed with hypogonadism approximately 4 years ago. He was being treated with testosterone injection until October 2017. On 12/19/2015 he was found to have supraphysiologic testosterone level of 1374 which prompted the discontinuation of his medication. - Since then, too subsequent labs showed total testosterone level of 487 and 408. -Before this visit his total testosterone was found to be 223, along with low free testosterone of 28.8. He wishes to be treated for hypogonadism. - On further interview patient reports that he did have shrunk testes for "years", progressive gynecomastia which forced him to consult with several surgeons and currently considering plastic surgery. -He fathers 2 grown children. His growth and development was uneventful. -He denies injury to the testicles, infections, radiation, nor chemotherapy. He denies history of significant head injury. -He has erectile dysfunction consulting with urology.  - He denies any history of sleep apnea, however he has atrial fibrillation on treatment.  Review of Systems   Constitutional: +  has steady weight, + fatigue, no subjective hyperthermia/hypothermia Eyes: no blurry vision, no xerophthalmia ENT: no sore throat, no nodules palpated in throat, no dysphagia/odynophagia, no hoarseness Cardiovascular: no CP/SOB/palpitations/leg swelling Respiratory: no cough/SOB Gastrointestinal: no N/V/D/C Musculoskeletal: no muscle/joint aches Skin: + Progressive bilateral gynecomastia, no rashes Neurological: no tremors/numbness/tingling/dizziness Psychiatric: no depression/anxiety  Objective:    BP 133/85   Pulse 66   Ht  (1.727 m)   Wt 239 lb (108.4 kg)   BMI 36.34 kg/m   Wt Readings from Last 3 Encounters:  11/14/16 239 lb (108.4 kg)  11/14/16 239 lb (108.4 kg)  10/29/16 232 lb (105.2 kg)    Physical Exam Constitutional: overweight, in NAD Eyes: PERRLA,  EOMI, no exophthalmos ENT: moist mucous membranes, no thyromegaly, no cervical lymphadenopathy Cardiovascular: RRR, No MRG Respiratory: CTA B Gastrointestinal: abdomen soft, NT, ND, BS+ Genitourinary: Bilaterally shrunk testicles to 8-10 mL, nontender: No intrascrotal  masse lesion Musculoskeletal: no deformities, strength intact in all 4 Skin:  Significant bilateral large gynecomastia, moist, warm, no rashes Neurological: no tremor with outstretched hands, DTR normal in all 4   Results for KASHON, KRAYNAK (MRN [id number]) as of 11/14/2016 13:56  Ref. Range 03/26/2016 07:28 06/25/2016 10:48 09/24/2016 08:20  Sex Horm Binding Glob, Serum Latest Ref Range: 22 - 77 nmol/L 64 80 (H) 59  Testosterone Latest Ref Range: 250 - 827 ng/dL 010 272 536 (L)  Testosterone, Bioavailable Latest Ref Range: 110.0 - 575.0 ng/dL 64.4 (L) 03.4 (L)   Testosterone Free Latest Ref Range: 47.0 - 244.0 pg/mL 36.8 (L) 24.7 (L) 28.8 (L)  Testosterone-% Free Latest Ref Range: 1.6 - 2.9 %   1.3 (L)    12/19/2015 total testosterone 1374.   Assessment & Plan:   1. Hypogonadism - He has  long-standing history of hypogonadism of multiple etiologies. -The most likely etiology seems to be suppression  of hypothalamic-pituitary-gonadal  Axis suppression by use of various bodybuilding elements including steroids, androgens, proteins, and opioids. -He has significant gynecomastia,unfortunately his gynecomastia cannot be reversed with medical therapy. His only option would be plastic surgery which is unlikely to be covered by his insurance. He understands this process and currently saving money to pay for his surgery out of pocket. - Regarding his hypogonadism: He was taken off of  testosterone replacement for the time-due to the fact that he went supraphysiologic at 1374. -  His previously testosterone level now is 408, during last visit it was 487, now dropping to 222. - He wishes to be treated with testosterone. He is now  stating that he is not interested to keep his fertility. - He  had a tendency to overdose with testosterone. I discussed and initiated AndroGel 20.25 mg transdermal daily. - Target testosterone level for him would be between 400-600. - She will return in 3 months with repeat labs for total testosterone, PSA, CBC.  Testosterone replacement will not significantly alter his ED however will help with libido. - I encouraged him to continue follow-up with his urologist regarding his ED.   I  discussed adverse effects of unnecessary testosterone replacement short-term and long-term.   - He  has polypharmacy including multiple supplements. I advised him to stay away from bodybuilding products including androgens, steroids, and various protein preparations. - He has supraphysiologic blood levels of vitamin B12. I have advised him to discontinue the vitamin B12 supplements he is taking from the over-the-counter.   I advised patient to maintain close follow up with Elfredia NevinsFusco, Lawrence, MD for primary care needs.  Follow up plan: Return in about 3 months (around 02/13/2017) for follow up with pre-visit labs.  Marquis LunchGebre Taleigha Pinson, MD Phone: [phone]  Fax: [phone]  This note was partially dictated with voice recognition software. Similar sounding words can be transcribed inadequately or may not  be corrected upon review.  11/14/2016, 1:44 PM

## 2016-11-15 ENCOUNTER — Other Ambulatory Visit: Payer: Self-pay | Admitting: "Endocrinology

## 2016-11-15 ENCOUNTER — Telehealth: Payer: Self-pay

## 2016-11-15 MED ORDER — TESTOSTERONE 50 MG/5GM (1%) TD GEL: 5.0000 g | Freq: Every day | TRANSDERMAL | 2 refills | Status: DC

## 2016-11-15 NOTE — Telephone Encounter (Signed)
Will try testosterone 1% gel  5gm daily.

## 2016-11-15 NOTE — Telephone Encounter (Signed)
Pts insurance will not cover Androgel. He is requesting an alternate.

## 2016-11-16 ENCOUNTER — Emergency Department (HOSPITAL_COMMUNITY): Payer: PPO

## 2016-11-16 ENCOUNTER — Encounter (HOSPITAL_COMMUNITY): Payer: Self-pay | Admitting: Emergency Medicine

## 2016-11-16 ENCOUNTER — Emergency Department (HOSPITAL_COMMUNITY)
Admission: EM | Admit: 2016-11-16 | Discharge: 2016-11-17 | Disposition: A | Discharge status: Home / Self Care | Payer: PPO | Attending: Emergency Medicine | Admitting: Emergency Medicine

## 2016-11-16 DIAGNOSIS — F1910 Other psychoactive substance abuse, uncomplicated: Secondary | ICD-10-CM | POA: Diagnosis not present

## 2016-11-16 DIAGNOSIS — R609 Edema, unspecified: Secondary | ICD-10-CM | POA: Insufficient documentation

## 2016-11-16 DIAGNOSIS — R4182 Altered mental status, unspecified: Secondary | ICD-10-CM | POA: Diagnosis not present

## 2016-11-16 DIAGNOSIS — E860 Dehydration: Secondary | ICD-10-CM | POA: Diagnosis not present

## 2016-11-16 DIAGNOSIS — J449 Chronic obstructive pulmonary disease, unspecified: Secondary | ICD-10-CM | POA: Diagnosis not present

## 2016-11-16 DIAGNOSIS — Z79899 Other long term (current) drug therapy: Secondary | ICD-10-CM | POA: Diagnosis not present

## 2016-11-16 DIAGNOSIS — J189 Pneumonia, unspecified organism: Secondary | ICD-10-CM | POA: Diagnosis not present

## 2016-11-16 DIAGNOSIS — Z87891 Personal history of nicotine dependence: Secondary | ICD-10-CM | POA: Insufficient documentation

## 2016-11-16 DIAGNOSIS — R4020 Unspecified coma: Secondary | ICD-10-CM | POA: Diagnosis not present

## 2016-11-16 DIAGNOSIS — Z7982 Long term (current) use of aspirin: Secondary | ICD-10-CM | POA: Diagnosis not present

## 2016-11-16 DIAGNOSIS — E876 Hypokalemia: Secondary | ICD-10-CM | POA: Diagnosis not present

## 2016-11-16 DIAGNOSIS — R0600 Dyspnea, unspecified: Secondary | ICD-10-CM | POA: Diagnosis not present

## 2016-11-16 DIAGNOSIS — G8929 Other chronic pain: Secondary | ICD-10-CM | POA: Diagnosis not present

## 2016-11-16 DIAGNOSIS — J439 Emphysema, unspecified: Secondary | ICD-10-CM | POA: Diagnosis not present

## 2016-11-16 DIAGNOSIS — R404 Transient alteration of awareness: Secondary | ICD-10-CM | POA: Diagnosis not present

## 2016-11-16 DIAGNOSIS — R531 Weakness: Secondary | ICD-10-CM | POA: Diagnosis not present

## 2016-11-16 DIAGNOSIS — R061 Stridor: Secondary | ICD-10-CM | POA: Diagnosis not present

## 2016-11-16 LAB — CBC WITH DIFFERENTIAL/PLATELET
BASOS ABS: 0.1 10*3/uL (ref 0.0–0.1)
Band Neutrophils: 0 %
Basophils Relative: 1 %
Blasts: 0 %
EOS PCT: 3 %
Eosinophils Absolute: 0.3 10*3/uL (ref 0.0–0.7)
HCT: 40.7 % (ref 39.0–52.0)
Hemoglobin: 13.2 g/dL (ref 13.0–17.0)
LYMPHS ABS: 2.4 10*3/uL (ref 0.7–4.0)
Lymphocytes Relative: 24 %
MCH: 32.6 pg (ref 26.0–34.0)
MCHC: 32.4 g/dL (ref 30.0–36.0)
MCV: 100.5 fL — ABNORMAL HIGH (ref 78.0–100.0)
METAMYELOCYTES PCT: 0 %
Monocytes Absolute: 1.1 10*3/uL — ABNORMAL HIGH (ref 0.1–1.0)
Monocytes Relative: 11 %
Myelocytes: 0 %
Neutro Abs: 6.3 10*3/uL (ref 1.7–7.7)
Neutrophils Relative %: 61 %
Other: 0 %
Platelets: 165 10*3/uL (ref 150–400)
Promyelocytes Absolute: 0 %
RBC: 4.05 MIL/uL — AB (ref 4.22–5.81)
RDW: 13.6 % (ref 11.5–15.5)
WBC: 10.2 10*3/uL (ref 4.0–10.5)
nRBC: 0 /100 WBC

## 2016-11-16 LAB — COMPREHENSIVE METABOLIC PANEL
ALK PHOS: 33 U/L — AB (ref 38–126)
ALT: 21 U/L (ref 17–63)
AST: 34 U/L (ref 15–41)
Albumin: 3.7 g/dL (ref 3.5–5.0)
Anion gap: 13 (ref 5–15)
BILIRUBIN TOTAL: 0.9 mg/dL (ref 0.3–1.2)
BUN: 19 mg/dL (ref 6–20)
CALCIUM: 8.8 mg/dL — AB (ref 8.9–10.3)
CO2: 36 mmol/L — AB (ref 22–32)
CREATININE: 1.14 mg/dL (ref 0.61–1.24)
Chloride: 90 mmol/L — ABNORMAL LOW (ref 101–111)
Glucose, Bld: 110 mg/dL — ABNORMAL HIGH (ref 65–99)
Potassium: 3 mmol/L — ABNORMAL LOW (ref 3.5–5.1)
Sodium: 139 mmol/L (ref 135–145)
Total Protein: 7.8 g/dL (ref 6.5–8.1)

## 2016-11-16 LAB — PROTIME-INR
INR: 1.03
PROTHROMBIN TIME: 13.4 s (ref 11.4–15.2)

## 2016-11-16 LAB — LACTIC ACID, PLASMA: LACTIC ACID, VENOUS: 2 mmol/L — AB (ref 0.5–1.9)

## 2016-11-16 LAB — LIPASE, BLOOD: LIPASE: 24 U/L (ref 11–51)

## 2016-11-16 LAB — ETHANOL: Alcohol, Ethyl (B): <5 mg/dL (ref <5)

## 2016-11-16 MED ORDER — SODIUM CHLORIDE 0.9 % IV SOLN: INTRAVENOUS | Status: DC

## 2016-11-16 MED ORDER — SODIUM CHLORIDE 0.9 % IV BOLUS (SEPSIS)
1000.0000 mL | Freq: Once | INTRAVENOUS | Status: AC
  Administered 2016-11-16: 1000 mL via INTRAVENOUS

## 2016-11-16 MED ORDER — SODIUM CHLORIDE 0.9 % IV SOLN
INTRAVENOUS | Status: DC
  Administered 2016-11-16: 22:00:00 via INTRAVENOUS

## 2016-11-16 NOTE — ED Triage Notes (Signed)
Pt brought in by ems for frequent falls, lethargy, and low O2 sat.  Pt lethargic, but oriented on arrival.  Slurring to speech noted.

## 2016-11-16 NOTE — ED Notes (Signed)
Pt.s ex wife is very concerned about patient's use of prescription medication. States that he has been falling for quite a while and is very lethargic a lot. States pt. Takes xanax regularly and has been for 30 years. States the other day he had 2 xanax bars lying around as well as 3 other meds.

## 2016-11-16 NOTE — ED Provider Notes (Addendum)
[facility] Provider Note   CSN: [id number] Arrival date & time: 11/16/16  1752     History   Chief Complaint Chief Complaint  Patient presents with  . Altered Mental Status    HPI Wesley Ho is a 61 y.o. male.  Patient brought in by rocking him [facility]. For altered mental status. Patient's wife called. Patient was concerned about patient's use of prescription medications. States he has been falling for quite a while and is lethargic a lot. Parent may take Xanax regularly has been on Xanax long-term. He also has hydrocodone. Reportedly a 2 Xanax today and 3 hydrocodone's. Patient's wife states that he seemed to improve his in hospital before but then shortly after arriving home he's had the times of altered mental status and is been out of it at times and has had a recurrent history of falls.      Past Medical History:  Diagnosis Date  . Anxiety   . Arthritis   . Bronchitis   . Chronic diarrhea   . Chronic edema of lower extremity 09/28/2010  . COPD (chronic obstructive pulmonary disease) (HCC)   . DVT (deep venous thrombosis) (HCC) 09/28/2010  . Emphysema of lung (HCC)   . Fatigue   . H/O Thrombocytopenia 09/28/2010  . Left radial fracture   . Multiple lung nodules 09/28/2010  . Neuropathy     Patient Active Problem List   Diagnosis Date Noted  . Chronic left shoulder pain 08/07/2016  . Gynecomastia 02/24/2016  . Hypogonadism, male 02/23/2016  . Hyperglycemia 07/08/2014  . COPD (chronic obstructive pulmonary disease) (HCC)   . Dizziness 07/07/2014  . Generalized weakness 07/07/2014  . ETOH abuse 07/07/2014  . Macrocytic anemia 07/07/2014  . Left radial fracture 07/07/2014  . Anxiety 07/07/2014  . Diarrhea   . Acute bronchitis 03/16/2012  . Acute sinusitis 03/16/2012  . COPD exacerbation (HCC) 03/16/2012  . Alcohol consumption heavy 03/16/2012  . Tobacco abuse 03/16/2012  . Macrocytosis without anemia 03/16/2012  . Leg pain, bilateral 03/16/2012   . Personal history of DVT (deep vein thrombosis) 03/16/2012  . Hyponatremia 03/16/2012  . DVT (deep venous thrombosis) (HCC) 09/28/2010  . Thrombocytopenia (HCC) 09/28/2010  . Chronic edema of lower extremity 09/28/2010  . Multiple lung nodules 09/28/2010    Past Surgical History:  Procedure Laterality Date  . CATARACT EXTRACTION W/PHACO Right 01/17/2016   Procedure: CATARACT EXTRACTION PHACO AND INTRAOCULAR LENS PLACEMENT (IOC);  Surgeon: Jethro Bolus, MD;  Location: [facility];  Service: Ophthalmology;  Laterality: Right;  CDE: 4.34  . CATARACT EXTRACTION W/PHACO Left 01/31/2016   Procedure: CATARACT EXTRACTION PHACO AND INTRAOCULAR LENS PLACEMENT (IOC);  Surgeon: Jethro Bolus, MD;  Location: [facility];  Service: Ophthalmology;  Laterality: Left;  CDE: 5.69  . CHOLECYSTECTOMY    . MOUTH SURGERY         Home Medications    Prior to Admission medications   Medication Sig Start Date End Date Taking? Authorizing Provider  acetaminophen (TYLENOL) 500 MG tablet Take 500-1,000 mg by mouth every 6 (six) hours as needed for mild pain.    [provider]  alprazolam Prudy Feeler) 2 MG tablet Take 0.5 tablets (1 mg total) by mouth 4 (four) times daily as needed. For anxiety. 10/30/16   Lonia Blood, MD  aspirin EC 81 MG tablet Take 81 mg by mouth daily.    [provider]  aspirin-sod bicarb-citric acid (ALKA-SELTZER) 325 MG TBEF tablet Take 325 mg by mouth every 6 (six) hours  as needed. 01/31/16   [provider]  Biotin 5000 MCG TABS Take 5,000 mcg by mouth daily.    [provider]  busPIRone (BUSPAR) 10 MG tablet Take 10 mg by mouth 2 (two) times daily.     [provider]  cholecalciferol (VITAMIN D) 1000 units tablet Take 1,000 Units by mouth daily.    [provider]  ferrous sulfate (IRON SUPPLEMENT) 325 (65 FE) MG tablet Take 65 mg by mouth daily with breakfast.    [provider]  furosemide (LASIX) 20 MG tablet Take 60 mg by  mouth daily.    [provider]  gabapentin (NEURONTIN) 800 MG tablet Take 0.5 tablets (400 mg total) by mouth 2 (two) times daily. Patient not taking: Reported on 11/14/2016 10/30/16   Lonia Blood, MD  hydrochlorothiazide (HYDRODIURIL) 25 MG tablet Take 25 mg by mouth daily.    [provider]  HYDROcodone-acetaminophen (NORCO) 7.5-325 MG tablet One every six hours for pain as needed.  Do not drive car or operate machinery while taking this medicine.  Must last 14 days. 11/14/16   Darreld Mclean, MD  Multiple Vitamins-Minerals (HM COMPLETE 50+ MENS ULTIMATE PO) Take 1 tablet by mouth daily.    [provider]  Potassium 99 MG TABS Take 99 mg by mouth daily.    [provider]  pregabalin (LYRICA) 200 MG capsule Take 200 mg by mouth 3 (three) times daily.    [provider]  tamoxifen (NOLVADEX) 20 MG tablet Take 20 mg by mouth daily. 12/19/15   [provider]  testosterone (ANDROGEL) 50 MG/5GM (1%) GEL Place 5 g onto the skin daily. 11/15/16   Roma Kayser, MD  WHEY PROTEIN PO Take by mouth 2 (two) times daily.    [provider]    Family History Family History  Problem Relation Age of Onset  . Diabetes Mother   . Clotting disorder Father     Social History Social History  Substance Use Topics  . Smoking status: Former Smoker    Packs/day: 1.00    Years: 40.00    Quit date: 01/11/2013  . Smokeless tobacco: Never Used  . Alcohol use No     Comment: stopped drinking 2016.     Allergies   Patient has no known allergies.   Review of Systems Review of Systems  Unable to perform ROS: Mental status change     Physical Exam Updated Vital Signs BP 124/62 (BP Location: Right Arm)   Pulse 66   Temp 98.2 F (36.8 C) (Oral)   Resp 14   Ht 1.727 m ( )   Wt 108.4 kg (239 lb)   SpO2 99%   BMI 36.34 kg/m   Physical Exam  Constitutional: He appears well-developed and well-nourished.  HENT:  Head:  Normocephalic and atraumatic.  Eyes: Pupils are equal, round, and reactive to light. EOM are normal.  Neck: Normal range of motion. Neck supple.  Cardiovascular: Normal rate, regular rhythm and normal heart sounds.   Pulmonary/Chest: Effort normal and breath sounds normal.  Abdominal: Soft. Bowel sounds are normal.  Musculoskeletal: Normal range of motion. He exhibits edema.  Bilateral lower extremity edema. This was present in August as well.  Neurological: He is alert. A cranial nerve deficit is present. No sensory deficit. He exhibits normal muscle tone. Coordination normal.  Speech is slurred.  Skin: Skin is warm.  Scattered [place] healing abrasions on lower extremity. No secondary infection.  Nursing note and  vitals reviewed.    ED Treatments / Results  Labs (all labs ordered are listed, but only abnormal results are displayed) Labs Reviewed  COMPREHENSIVE METABOLIC PANEL - Abnormal; Notable for the following:       Result Value   Potassium 3.0 (*)    Chloride 90 (*)    CO2 36 (*)    Glucose, Bld 110 (*)    Calcium 8.8 (*)    Alkaline Phosphatase 33 (*)    All other components within normal limits  CBC WITH DIFFERENTIAL/PLATELET - Abnormal; Notable for the following:    RBC 4.05 (*)    MCV 100.5 (*)    Monocytes Absolute 1.1 (*)    All other components within normal limits  LACTIC ACID, PLASMA - Abnormal; Notable for the following:    Lactic Acid, Venous 2.0 (*)    All other components within normal limits  LIPASE, BLOOD  PROTIME-INR  ETHANOL  RAPID URINE DRUG SCREEN, HOSP PERFORMED  URINALYSIS, ROUTINE W REFLEX MICROSCOPIC    EKG  EKG Interpretation None       Radiology Dg Chest 2 View  Result Date: 11/16/2016 CLINICAL DATA:  Altered mental status with lethargy. Decreased oxygen saturation EXAM: CHEST  2 VIEW COMPARISON:  Jul 07, 2014 FINDINGS: There is a small area of opacity in the posterior left base, concerning for small focus of pneumonia. Lungs elsewhere  are clear. Heart size and pulmonary vascularity are normal. No adenopathy. No bone lesions. IMPRESSION: Small focus of opacity posterior left base concerning for pneumonia. Lungs elsewhere clear. Heart size within normal limits. Followup PA and lateral chest radiographs recommended in 3-4 weeks following trial of antibiotic therapy to ensure resolution and exclude underlying malignancy. Electronically Signed   By: Bretta Bang III M.D.   On: 11/16/2016 21:45   Ct Head Wo Contrast  Result Date: 11/16/2016 CLINICAL DATA:  Altered level of consciousness.  Unexplained. EXAM: CT HEAD WITHOUT CONTRAST TECHNIQUE: Contiguous axial images were obtained from the base of the skull through the vertex without intravenous contrast. COMPARISON:  10/29/2016 FINDINGS: Brain: Prominence of the sulci and ventricles are identified compatible with brain atrophy. There is no evidence of acute infarction, hemorrhage, hydrocephalus, extra-axial collection or mass lesion/mass effect. Vascular: No hyperdense vessel or unexpected calcification. Skull: The paranasal sinuses and the mastoid air cells appear clear. The calvarium is intact. Sinuses/Orbits: No acute finding. Other: None. IMPRESSION: 1. No acute intracranial abnormalities 2. Generalized atrophy is mildly advanced for age. Electronically Signed   By: Signa Kell M.D.   On: 11/16/2016 22:00    Procedures Procedures (including critical care time)  Medications Ordered in ED Medications  0.9 %  sodium chloride infusion ( Intravenous New Bag/Given 11/16/16 2200)  sodium chloride 0.9 % bolus 1,000 mL (1,000 mLs Intravenous New Bag/Given 11/16/16 2159)     Initial Impression / Assessment and Plan / ED Course  I have reviewed the triage vital signs and the nursing notes.  Pertinent labs & imaging results that were available during my care of the patient were reviewed by me and considered in my medical decision making (see chart for details).     She will  presenting with similar presentation from admission on August 27 discharged home the next day on August 28. Patient's wife states that since he been back home pretty much just been kind of out of it. She is concerned that he is getting too much benzodiazepines too much pain medicine. Patient has been falling at home. During  the last admission patient did have MRI of the brain without any acute findings. Did have hypokalemia potassium at 2.8 at that time. Which was the main reason for the admission. The rest of the workup for the altered mental status was negative.   Here today patient does appear clinically dehydrated mouth very dry. Lactic acid slightly elevated at 2.0. No leukocytosis. Potassium reasonable today at 3.0. Not acidotic renal function is normal.  CT head is pending. Chest x-ray raised concern for possible pneumonia. Patient will get CT chest to confirm this.  Suspect this may be benzo diazepam as the cause of the altered mental status. Could be contribute in factor for pain medicine as well. Last time patient does not respond to Narcan.  If CT head shows reason for admission or chest x-ray so sniffing and pneumonia. Admission can be considered. Feel the patient needs to be continued to be observed and would expect his mental status to improve as it did before if it does not may require admission.  Urine drug screen is still pending.  Final Clinical Impressions(s) / ED Diagnoses   Final diagnoses:  Altered mental status, unspecified altered mental status type  Dehydration  Hypokalemia  Substance abuse    New Prescriptions New Prescriptions   No medications on file     Vanetta Mulders, MD 11/16/16 2217   CT head negative for any acute findings. Clinically this is most likely all related to benzo diazepam overuse. Patient will be continued to be observed.   Vanetta Mulders, MD 11/16/16 2229

## 2016-11-16 NOTE — ED Notes (Signed)
Wife's Number   [phone]

## 2016-11-16 NOTE — ED Notes (Signed)
MD notified of pt shaking and stated this is normal

## 2016-11-16 NOTE — ED Notes (Signed)
Date and time results received: 11/16/16 1956 (use smartphrase ".now" to insert current time)  Test: Lactic acid Critical Value: 2.0  Name of Provider Notified: Zackowski  Orders Received? Or Actions Taken?:

## 2016-11-16 NOTE — ED Notes (Signed)
Pt is very lethargic upon assessment. States he has taken 2 xanax's and 3 hydrocodone's today

## 2016-11-17 LAB — URINALYSIS, ROUTINE W REFLEX MICROSCOPIC
Bilirubin Urine: NEGATIVE
GLUCOSE, UA: NEGATIVE mg/dL
Hgb urine dipstick: NEGATIVE
KETONES UR: NEGATIVE mg/dL
LEUKOCYTES UA: NEGATIVE
NITRITE: NEGATIVE
PROTEIN: NEGATIVE mg/dL
Specific Gravity, Urine: 1.012 (ref 1.005–1.030)
pH: 7 (ref 5.0–8.0)

## 2016-11-17 LAB — LACTIC ACID, PLASMA: Lactic Acid, Venous: 1.5 mmol/L (ref 0.5–1.9)

## 2016-11-17 LAB — RAPID URINE DRUG SCREEN, HOSP PERFORMED
AMPHETAMINES: NOT DETECTED
BARBITURATES: NOT DETECTED
BENZODIAZEPINES: POSITIVE — AB
COCAINE: NOT DETECTED
Opiates: POSITIVE — AB
Tetrahydrocannabinol: NOT DETECTED

## 2016-11-17 NOTE — ED Notes (Signed)
Pt's O2 sat ranged from 87-91% on RA. EDP notified. Pt placed back on O2 at 2L via [place]. Pt found to be incontinent of urine at this time also.

## 2016-11-17 NOTE — ED Notes (Signed)
Went into assess patient, patients oxygen sat 87% on 1 LPM of oxygen via [place]. Increasd oxygen to 2 LPM. Patient opened eyes with verbal stimuli.

## 2016-11-17 NOTE — ED Notes (Signed)
Per Dr. Jacqulyn Bath, he spoke with patient and patient may be d/c. PAtient states he wants to go and to call a ride.

## 2016-11-17 NOTE — ED Provider Notes (Signed)
Blood pressure 101/64, pulse 75, temperature 98.4 F (36.9 C), temperature source Oral, resp. rate 16, height  (1.727 m), weight 108.4 kg (239 lb), SpO2 97 %.  Assuming care from Dr. Lynelle Doctor.  In short, Wesley Ho is a 61 y.o. male with a chief complaint of Altered Mental Status .  Refer to the original H&P for additional details.  Patient re-evaluated. More alert and conversational with me. Reports feeling "much better." Will call ex-wife for transport home.   At this time, I do not feel there is any life-threatening condition present. I have reviewed and discussed all results (EKG, imaging, lab, urine as appropriate), exam findings with patient. I have reviewed nursing notes and appropriate previous records.  I feel the patient is safe to be discharged home without further emergent workup. Discussed usual and customary return precautions. Patient and family (if present) verbalize understanding and are comfortable with this plan.  Patient will follow-up with their primary care provider. If they do not have a primary care provider, information for follow-up has been provided to them. All questions have been answered.  Alona Bene, MD    Maia Plan, MD 11/17/16 [phone]

## 2016-11-17 NOTE — ED Notes (Signed)
Updated Dr. Jacqulyn Bath regarding patients status. Per Dr. Jacqulyn Bath, continue to assess patient and notify him of any new changes.

## 2016-11-17 NOTE — ED Notes (Addendum)
Pt O2 80%. Pt placed on 3L [place]. MD notified. Time 0500.

## 2016-11-17 NOTE — ED Notes (Signed)
Pt given breakfast tray per EDP verbal order.

## 2016-11-17 NOTE — ED Notes (Signed)
Patient continues to appear sedated. Patient has equal and unlabored respirations.Sats 97% on 2 LPM.

## 2016-11-17 NOTE — ED Provider Notes (Addendum)
Patient left at change of shift by Dr. Estell Harpin to get results of a CT scan of the chest and a second lactic acid. Patient is awake when I enter the room although he still seems a little bit sluggish. He states his memory of what happened tonight was a blur but from what he can remember he was standing on a chair to reach something and he thinks he fell. He states he lives alone but his ex-wife came by and found him. His only complaint to me is some discomfort in his left chest.  Review of his chart shows he was admitted on August 28 for slurred speech and difficulty ambulating and possible right-sided facial droop. Had a negative head CT and MRI and was felt to be from a overuse of his benzodiazepines.  Interestingly this altered mental status occurred within days of getting a prescription for hydrocodone in addition to his usual alprazolam. I suspect he is suffering from the combination of the 2 or taking too many of either of the 2.  Recheck at 6:45 AM patient states "I feel like a nun". He states he quit drinking and smoking. He states he's not concerned about addiction because he 61 years old. I discussed with him that he maybe should be concerned because he gets to the point where he doesn't even know where he is or what is going on. Patient asked what time it is and he thinks at 6:00 in the evening when at 6:00 in the morning. He states his ex-wife can come pick him up. Patient admits that he did get a prescription for pain medicine 2 days ago. He states he only took 2 or 3 with his usual 3 Xanax a day. He states he would not try to harm himself on purpose, he states "this boy wants to live".  07:20 AM pt's pulse ox in 88-91 % on RA. He is still sleepy, but easily arousable and talks without slurring. He is requesting to eat and drink. His ex-wife is at the bedside and can help him get home. Will observe a little longer then he can be discharged.   Review of the [place] shows patient  gets #120 alprazolam 2 mg tablets monthly from his PCP, last filled September 10. He was also getting #90 Lyrica 200 mg tablets, last filled August 24 from his PCP. He has gotten hydrocodone 5/325 from his orthopedist, he got #30 tablets on April 28, #30 on June 9, #30 on August 15, and #56 on September 12.   Results for orders placed or performed during the hospital encounter of 11/16/16  Comprehensive metabolic panel  Result Value Ref Range   Sodium 139 135 - 145 mmol/L   Potassium 3.0 (L) 3.5 - 5.1 mmol/L   Chloride 90 (L) 101 - 111 mmol/L   CO2 36 (H) 22 - 32 mmol/L   Glucose, Bld 110 (H) 65 - 99 mg/dL   BUN 19 6 - 20 mg/dL   Creatinine, Ser 5.62 0.61 - 1.24 mg/dL   Calcium 8.8 (L) 8.9 - 10.3 mg/dL   Total Protein 7.8 6.5 - 8.1 g/dL   Albumin 3.7 3.5 - 5.0 g/dL   AST 34 15 - 41 U/L   ALT 21 17 - 63 U/L   Alkaline Phosphatase 33 (L) 38 - 126 U/L   Total Bilirubin 0.9 0.3 - 1.2 mg/dL   GFR calc non Af Amer >60 >60 mL/min   GFR calc Af Amer >60 >60 mL/min  Anion gap 13 5 - 15  Lipase, blood  Result Value Ref Range   Lipase 24 11 - 51 U/L  CBC with Differential/Platelet  Result Value Ref Range   WBC 10.2 4.0 - 10.5 K/uL   RBC 4.05 (L) 4.22 - 5.81 MIL/uL   Hemoglobin 13.2 13.0 - 17.0 g/dL   HCT 16.1 09.6 - 04.5 %   MCV 100.5 (H) 78.0 - 100.0 fL   MCH 32.6 26.0 - 34.0 pg   MCHC 32.4 30.0 - 36.0 g/dL   RDW 40.9 81.1 - 91.4 %   Platelets 165 150 - 400 K/uL   Neutrophils Relative % 61 %   Lymphocytes Relative 24 %   Monocytes Relative 11 %   Eosinophils Relative 3 %   Basophils Relative 1 %   Band Neutrophils 0 %   Metamyelocytes Relative 0 %   Myelocytes 0 %   Promyelocytes Absolute 0 %   Blasts 0 %   nRBC 0 0 /100 WBC   Other 0 %   Neutro Abs 6.3 1.7 - 7.7 K/uL   Lymphs Abs 2.4 0.7 - 4.0 K/uL   Monocytes Absolute 1.1 (H) 0.1 - 1.0 K/uL   Eosinophils Absolute 0.3 0.0 - 0.7 K/uL   Basophils Absolute 0.1 0.0 - 0.1 K/uL  Protime-INR  Result Value Ref Range    Prothrombin Time 13.4 11.4 - 15.2 seconds   INR 1.03   Rapid urine drug screen (hospital performed)  Result Value Ref Range   Opiates POSITIVE (A) NONE DETECTED   Cocaine NONE DETECTED NONE DETECTED   Benzodiazepines POSITIVE (A) NONE DETECTED   Amphetamines NONE DETECTED NONE DETECTED   Tetrahydrocannabinol NONE DETECTED NONE DETECTED   Barbiturates NONE DETECTED NONE DETECTED  Ethanol  Result Value Ref Range   Alcohol, Ethyl (B) <5 <5 mg/dL  Lactic acid, plasma  Result Value Ref Range   Lactic Acid, Venous 2.0 (HH) 0.5 - 1.9 mmol/L  Urinalysis, Routine w reflex microscopic  Result Value Ref Range   Color, Urine YELLOW YELLOW   APPearance CLEAR CLEAR   Specific Gravity, Urine 1.012 1.005 - 1.030   pH 7.0 5.0 - 8.0   Glucose, UA NEGATIVE NEGATIVE mg/dL   Hgb urine dipstick NEGATIVE NEGATIVE   Bilirubin Urine NEGATIVE NEGATIVE   Ketones, ur NEGATIVE NEGATIVE mg/dL   Protein, ur NEGATIVE NEGATIVE mg/dL   Nitrite NEGATIVE NEGATIVE   Leukocytes, UA NEGATIVE NEGATIVE  Lactic acid, plasma  Result Value Ref Range   Lactic Acid, Venous 1.5 0.5 - 1.9 mmol/L    Laboratory interpretation all normal except Improving lactic acid with IV fluids, positive UDS, hypokalemia, persistent metabolic alkalosis    Dg Chest 2 View  Result Date: 11/16/2016 CLINICAL DATA:  Altered mental status with lethargy. Decreased oxygen saturation EXAM: CHEST  2 VIEW COMPARISON:  Jul 07, 2014 FINDINGS: There is a small area of opacity in the posterior left base, concerning for small focus of pneumonia. Lungs elsewhere are clear. Heart size and pulmonary vascularity are normal. No adenopathy. No bone lesions. IMPRESSION: Small focus of opacity posterior left base concerning for pneumonia. Lungs elsewhere clear. Heart size within normal limits. Followup PA and lateral chest radiographs recommended in 3-4 weeks following trial of antibiotic therapy to ensure resolution and exclude underlying malignancy.  Electronically Signed   By: Bretta Bang III M.D.   On: 11/16/2016 21:45   Ct Head Wo Contrast  Result Date: 11/16/2016 CLINICAL DATA:  Altered level of consciousness.  Unexplained. EXAM: CT  HEAD WITHOUT CONTRAST TECHNIQUE: Contiguous axial images were obtained from the base of the skull through the vertex without intravenous contrast. COMPARISON:  10/29/2016 FINDINGS: Brain: Prominence of the sulci and ventricles are identified compatible with brain atrophy. There is no evidence of acute infarction, hemorrhage, hydrocephalus, extra-axial collection or mass lesion/mass effect. Vascular: No hyperdense vessel or unexpected calcification. Skull: The paranasal sinuses and the mastoid air cells appear clear. The calvarium is intact. Sinuses/Orbits: No acute finding. Other: None. IMPRESSION: 1. No acute intracranial abnormalities 2. Generalized atrophy is mildly advanced for age. Electronically Signed   By: Signa Kell M.D.   On: 11/16/2016 22:00     Ct Chest Wo Contrast  Result Date: 11/16/2016 CLINICAL DATA:  Altered mental status with dyspnea. Unresolved pneumonia. EXAM: CT CHEST WITHOUT CONTRAST TECHNIQUE: Multidetector CT imaging of the chest was performed following the standard protocol without IV contrast. COMPARISON:  None. FINDINGS: Cardiovascular: Heart is top normal without pericardial effusion. Three-vessel coronary arteriosclerosis including the left main are noted. No aortic aneurysm. There is mild-to-moderate aortic atherosclerosis. Mild prominence of the central pulmonary vasculature likely reflects chronic pulmonary hypertension. Mediastinum/Nodes: No enlarged mediastinal or axillary lymph nodes. Thyroid gland, trachea, and esophagus demonstrate no significant findings. Lungs/Pleura: Centrilobular and paraseptal emphysema with lingular sub pleural atelectasis noted. Stable pleural-based right lower lobe nodular density is stable, image 101/4 with rounded 4 mm nodule in the superior  segment of the left lower lobe image 73/4. Additional right upper and left lower lobe nodules previously described are less apparent and may have were selected inflammatory nodules. No new pulmonary consolidation, effusion or pneumothorax. Upper Abdomen: Nodular liver surface morphologically consistent with cirrhosis. No acute abdominal abnormality. Musculoskeletal: No chest wall mass or suspicious bone lesions identified. IMPRESSION: 1. Central and paraseptal emphysema. 2. Scarring and/or atelectasis the lingula. No new pulmonary consolidation, effusion or pneumothorax. 3. Stable nodular densities in the lower lobes. Given stability since 02/07/2011 CT can these are likely to represent benign findings. Apparent resolution of right upper lobe nodular densities that may have represented inflammatory nodules. 4. Cirrhotic appearance of the liver. 5. Aortic atherosclerosis and coronary arteriosclerosis. Aortic Atherosclerosis (ICD10-I70.0) and Emphysema (ICD10-J43.9). Electronically Signed   By: Tollie Eth M.D.   On: 11/16/2016 23:56   Ct Head Wo Contrast  Result Date: 10/29/2016 CLINICAL DATA:  Altered mental status and dizziness for 2 weeks.IMPRESSION: 1. No acute intracranial abnormality. Normal for age noncontrast head CT. Electronically Signed   By: Obie Dredge M.D.   On: 10/29/2016 10:09   Mr Brain Wo Contrast (neuro Protocol)  Result Date: 10/29/2016 CLINICAL DATA:  Dizziness beginning 3 days ago. Ataxia. Focal neuro deficits greater than 6 hours. Stroke suspected. Fall today.  IMPRESSION: 1. Generalized atrophy is mildly advanced for age. 2. No acute intracranial abnormality. Electronically Signed   By: Marin Roberts M.D.   On: 10/29/2016 12:44    Diagnoses that have been ruled out:  None  Diagnoses that are still under consideration:  None  Final diagnoses:  Altered mental status, unspecified altered mental status type  Dehydration  Hypokalemia  Substance abuse    Plan  discharge  Devoria Albe, MD, Concha Pyo, MD 11/17/16 [id number]    Devoria Albe, MD 11/17/16 [phone]

## 2016-11-17 NOTE — ED Notes (Signed)
Pt still very drowsy, but does respond to verbal stimuli. O2 sat 87% on RA. EDP notified.

## 2016-11-17 NOTE — Discharge Instructions (Addendum)
You need to be careful when you take your pain pills with your Xanax. You were just admitted on August 27 when you took too much of your Xanax and you presented appearing to have had a stroke. I know that you are not concerned about being addicted however you need to be concerned about the number of pills that you're taking because it's altering your ability to walk, talk, and stay awake. This can be very dangerous and you are risk of dying from mixing these medications.

## 2016-11-19 ENCOUNTER — Emergency Department (HOSPITAL_COMMUNITY): Payer: No Typology Code available for payment source

## 2016-11-19 ENCOUNTER — Inpatient Hospital Stay (HOSPITAL_COMMUNITY)
Admission: EM | Admit: 2016-11-19 | Discharge: 2016-11-23 | DRG: 184 | Disposition: A | Discharge status: Home Health | Payer: No Typology Code available for payment source | Attending: Surgery | Admitting: Surgery

## 2016-11-19 ENCOUNTER — Encounter (HOSPITAL_COMMUNITY): Payer: Self-pay | Admitting: *Deleted

## 2016-11-19 DIAGNOSIS — Z9841 Cataract extraction status, right eye: Secondary | ICD-10-CM | POA: Diagnosis not present

## 2016-11-19 DIAGNOSIS — Z87891 Personal history of nicotine dependence: Secondary | ICD-10-CM

## 2016-11-19 DIAGNOSIS — Z86718 Personal history of other venous thrombosis and embolism: Secondary | ICD-10-CM

## 2016-11-19 DIAGNOSIS — G629 Polyneuropathy, unspecified: Secondary | ICD-10-CM | POA: Diagnosis present

## 2016-11-19 DIAGNOSIS — S299XXA Unspecified injury of thorax, initial encounter: Secondary | ICD-10-CM | POA: Diagnosis not present

## 2016-11-19 DIAGNOSIS — Z9842 Cataract extraction status, left eye: Secondary | ICD-10-CM | POA: Diagnosis not present

## 2016-11-19 DIAGNOSIS — S199XXA Unspecified injury of neck, initial encounter: Secondary | ICD-10-CM | POA: Diagnosis not present

## 2016-11-19 DIAGNOSIS — S0990XA Unspecified injury of head, initial encounter: Secondary | ICD-10-CM | POA: Diagnosis not present

## 2016-11-19 DIAGNOSIS — R0902 Hypoxemia: Secondary | ICD-10-CM

## 2016-11-19 DIAGNOSIS — S2220XA Unspecified fracture of sternum, initial encounter for closed fracture: Principal | ICD-10-CM | POA: Diagnosis present

## 2016-11-19 DIAGNOSIS — S2190XA Unspecified open wound of unspecified part of thorax, initial encounter: Secondary | ICD-10-CM | POA: Diagnosis not present

## 2016-11-19 DIAGNOSIS — S2232XA Fracture of one rib, left side, initial encounter for closed fracture: Secondary | ICD-10-CM

## 2016-11-19 DIAGNOSIS — Y92410 Unspecified street and highway as the place of occurrence of the external cause: Secondary | ICD-10-CM

## 2016-11-19 DIAGNOSIS — N62 Hypertrophy of breast: Secondary | ICD-10-CM | POA: Diagnosis present

## 2016-11-19 DIAGNOSIS — R05 Cough: Secondary | ICD-10-CM | POA: Diagnosis not present

## 2016-11-19 DIAGNOSIS — J449 Chronic obstructive pulmonary disease, unspecified: Secondary | ICD-10-CM | POA: Diagnosis present

## 2016-11-19 DIAGNOSIS — E291 Testicular hypofunction: Secondary | ICD-10-CM | POA: Diagnosis not present

## 2016-11-19 DIAGNOSIS — R0602 Shortness of breath: Secondary | ICD-10-CM | POA: Diagnosis not present

## 2016-11-19 DIAGNOSIS — Z961 Presence of intraocular lens: Secondary | ICD-10-CM | POA: Diagnosis not present

## 2016-11-19 DIAGNOSIS — R079 Chest pain, unspecified: Secondary | ICD-10-CM | POA: Diagnosis not present

## 2016-11-19 DIAGNOSIS — S2222XA Fracture of body of sternum, initial encounter for closed fracture: Secondary | ICD-10-CM | POA: Diagnosis not present

## 2016-11-19 DIAGNOSIS — S2242XA Multiple fractures of ribs, left side, initial encounter for closed fracture: Secondary | ICD-10-CM | POA: Diagnosis not present

## 2016-11-19 DIAGNOSIS — R51 Headache: Secondary | ICD-10-CM | POA: Diagnosis not present

## 2016-11-19 DIAGNOSIS — F419 Anxiety disorder, unspecified: Secondary | ICD-10-CM | POA: Diagnosis not present

## 2016-11-19 DIAGNOSIS — S2239XA Fracture of one rib, unspecified side, initial encounter for closed fracture: Secondary | ICD-10-CM

## 2016-11-19 DIAGNOSIS — S3991XA Unspecified injury of abdomen, initial encounter: Secondary | ICD-10-CM | POA: Diagnosis not present

## 2016-11-19 DIAGNOSIS — S61401A Unspecified open wound of right hand, initial encounter: Secondary | ICD-10-CM | POA: Diagnosis not present

## 2016-11-19 DIAGNOSIS — J441 Chronic obstructive pulmonary disease with (acute) exacerbation: Secondary | ICD-10-CM

## 2016-11-19 LAB — CBC WITH DIFFERENTIAL/PLATELET
BASOS ABS: 0 10*3/uL (ref 0.0–0.1)
BASOS PCT: 0 %
EOS ABS: 0.7 10*3/uL (ref 0.0–0.7)
EOS PCT: 6 %
HEMATOCRIT: 37.8 % — AB (ref 39.0–52.0)
HEMOGLOBIN: 12.8 g/dL — AB (ref 13.0–17.0)
LYMPHS ABS: 2.6 10*3/uL (ref 0.7–4.0)
Lymphocytes Relative: 21 %
MCH: 33.5 pg (ref 26.0–34.0)
MCHC: 33.9 g/dL (ref 30.0–36.0)
MCV: 99 fL (ref 78.0–100.0)
MONO ABS: 0.7 10*3/uL (ref 0.1–1.0)
MONOS PCT: 6 %
Neutro Abs: 8.3 10*3/uL — ABNORMAL HIGH (ref 1.7–7.7)
Neutrophils Relative %: 67 %
Platelets: 133 10*3/uL — ABNORMAL LOW (ref 150–400)
RBC: 3.82 MIL/uL — ABNORMAL LOW (ref 4.22–5.81)
RDW: 13.3 % (ref 11.5–15.5)
WBC: 12.3 10*3/uL — ABNORMAL HIGH (ref 4.0–10.5)

## 2016-11-19 LAB — COMPREHENSIVE METABOLIC PANEL
ALBUMIN: 3.6 g/dL (ref 3.5–5.0)
ALK PHOS: 32 U/L — AB (ref 38–126)
ALT: 28 U/L (ref 17–63)
AST: 51 U/L — AB (ref 15–41)
Anion gap: 10 (ref 5–15)
BILIRUBIN TOTAL: 0.9 mg/dL (ref 0.3–1.2)
BUN: 11 mg/dL (ref 6–20)
CO2: 30 mmol/L (ref 22–32)
Calcium: 8.1 mg/dL — ABNORMAL LOW (ref 8.9–10.3)
Chloride: 96 mmol/L — ABNORMAL LOW (ref 101–111)
Creatinine, Ser: 0.9 mg/dL (ref 0.61–1.24)
GFR calc Af Amer: >60 mL/min (ref >60)
GFR calc non Af Amer: >60 mL/min (ref >60)
Glucose, Bld: 99 mg/dL (ref 65–99)
POTASSIUM: 3.3 mmol/L — AB (ref 3.5–5.1)
SODIUM: 136 mmol/L (ref 135–145)
TOTAL PROTEIN: 7.4 g/dL (ref 6.5–8.1)

## 2016-11-19 LAB — RAPID URINE DRUG SCREEN, HOSP PERFORMED
AMPHETAMINES: NOT DETECTED
BARBITURATES: NOT DETECTED
Benzodiazepines: POSITIVE — AB
COCAINE: NOT DETECTED
Opiates: POSITIVE — AB
Tetrahydrocannabinol: NOT DETECTED

## 2016-11-19 LAB — ETHANOL: Alcohol, Ethyl (B): <5 mg/dL (ref <5)

## 2016-11-19 LAB — LIPASE, BLOOD: Lipase: 21 U/L (ref 11–51)

## 2016-11-19 MED ORDER — SODIUM CHLORIDE 0.9 % IV BOLUS (SEPSIS)
1000.0000 mL | Freq: Once | INTRAVENOUS | Status: AC
  Administered 2016-11-19: 1000 mL via INTRAVENOUS

## 2016-11-19 NOTE — Progress Notes (Signed)
2 necklaces( 1 with 5 diamonds and another with 1 charm) taken off of patient in ziplock bag with patient's name on it and given to Ginger RN by transporter
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
## 2016-11-19 NOTE — ED Notes (Signed)
Pt resting with eyes closed, will arouse when spoken to, update given,

## 2016-11-19 NOTE — ED Notes (Signed)
ED Provider at bedside. 

## 2016-11-19 NOTE — ED Notes (Signed)
Pt very unsteady on his feet and shaky,  required two assist to ambulate to end of room, pt room air pulse ox mid 80's, pt placed back on oxygen at 2 lpm via [place] with improvement of pulse ox to 95%, Dr Adriana Simas notified,

## 2016-11-19 NOTE — Telephone Encounter (Signed)
Pt notified by VM 

## 2016-11-19 NOTE — ED Notes (Signed)
Received report on pt, pt lying semi fowlers in bed, c-collar in place, pt alert, able to answer questions, speech slurred at times, cms intact all extremities,

## 2016-11-20 ENCOUNTER — Observation Stay (HOSPITAL_COMMUNITY): Payer: No Typology Code available for payment source

## 2016-11-20 ENCOUNTER — Emergency Department (HOSPITAL_COMMUNITY): Payer: No Typology Code available for payment source

## 2016-11-20 ENCOUNTER — Encounter (HOSPITAL_COMMUNITY): Payer: Self-pay

## 2016-11-20 DIAGNOSIS — S2220XA Unspecified fracture of sternum, initial encounter for closed fracture: Secondary | ICD-10-CM | POA: Diagnosis not present

## 2016-11-20 DIAGNOSIS — R079 Chest pain, unspecified: Secondary | ICD-10-CM | POA: Diagnosis not present

## 2016-11-20 DIAGNOSIS — S2242XA Multiple fractures of ribs, left side, initial encounter for closed fracture: Secondary | ICD-10-CM | POA: Diagnosis not present

## 2016-11-20 MED ORDER — HYDROCHLOROTHIAZIDE 25 MG PO TABS
25.0000 mg | ORAL_TABLET | Freq: Every day | ORAL | Status: DC
  Administered 2016-11-20 – 2016-11-23 (×4): 25 mg via ORAL
  Filled 2016-11-20 (×4): qty 1

## 2016-11-20 MED ORDER — SODIUM CHLORIDE 0.9 % IV SOLN
250.0000 mL | INTRAVENOUS | Status: DC | PRN
  Administered 2016-11-21: 250 mL via INTRAVENOUS

## 2016-11-20 MED ORDER — FENTANYL CITRATE (PF) 100 MCG/2ML IJ SOLN: 25.0000 ug | Freq: Once | INTRAMUSCULAR | Status: DC

## 2016-11-20 MED ORDER — IOPAMIDOL (ISOVUE-300) INJECTION 61%
100.0000 mL | Freq: Once | INTRAVENOUS | Status: AC | PRN
  Administered 2016-11-20: 100 mL via INTRAVENOUS

## 2016-11-20 MED ORDER — ALPRAZOLAM 0.5 MG PO TABS
1.0000 mg | ORAL_TABLET | Freq: Four times a day (QID) | ORAL | Status: DC | PRN
  Administered 2016-11-20 – 2016-11-23 (×8): 1 mg via ORAL
  Filled 2016-11-20 (×6): qty 2
  Filled 2016-11-20: qty 4
  Filled 2016-11-20: qty 2

## 2016-11-20 MED ORDER — BUSPIRONE HCL 10 MG PO TABS
10.0000 mg | ORAL_TABLET | Freq: Two times a day (BID) | ORAL | Status: DC
  Administered 2016-11-20 – 2016-11-23 (×6): 10 mg via ORAL
  Filled 2016-11-20 (×9): qty 1

## 2016-11-20 MED ORDER — ENOXAPARIN SODIUM 40 MG/0.4ML ~~LOC~~ SOLN
40.0000 mg | Freq: Every day | SUBCUTANEOUS | Status: DC
  Administered 2016-11-20 – 2016-11-23 (×4): 40 mg via SUBCUTANEOUS
  Filled 2016-11-20 (×5): qty 0.4

## 2016-11-20 MED ORDER — ONDANSETRON HCL 4 MG/2ML IJ SOLN: 4.0000 mg | Freq: Four times a day (QID) | INTRAMUSCULAR | Status: DC | PRN

## 2016-11-20 MED ORDER — PREGABALIN 75 MG PO CAPS
200.0000 mg | ORAL_CAPSULE | Freq: Three times a day (TID) | ORAL | Status: DC
  Administered 2016-11-20 – 2016-11-23 (×11): 200 mg via ORAL
  Filled 2016-11-20 (×2): qty 1
  Filled 2016-11-20 (×8): qty 2
  Filled 2016-11-20: qty 1

## 2016-11-20 MED ORDER — SODIUM CHLORIDE 0.9% FLUSH
3.0000 mL | Freq: Two times a day (BID) | INTRAVENOUS | Status: DC
  Administered 2016-11-20 – 2016-11-23 (×7): 3 mL via INTRAVENOUS

## 2016-11-20 MED ORDER — OXYCODONE HCL 5 MG PO TABS
5.0000 mg | ORAL_TABLET | ORAL | Status: DC | PRN
  Administered 2016-11-20: 5 mg via ORAL
  Administered 2016-11-21: 10 mg via ORAL
  Administered 2016-11-21: 5 mg via ORAL
  Administered 2016-11-21 (×2): 10 mg via ORAL
  Administered 2016-11-22 – 2016-11-23 (×4): 5 mg via ORAL
  Filled 2016-11-20: qty 1
  Filled 2016-11-20: qty 2
  Filled 2016-11-20 (×2): qty 1
  Filled 2016-11-20 (×2): qty 2
  Filled 2016-11-20 (×3): qty 1

## 2016-11-20 MED ORDER — TRAMADOL HCL 50 MG PO TABS
50.0000 mg | ORAL_TABLET | Freq: Four times a day (QID) | ORAL | Status: DC | PRN
  Administered 2016-11-22: 50 mg via ORAL
  Filled 2016-11-20: qty 1

## 2016-11-20 MED ORDER — GABAPENTIN 800 MG PO TABS: 400.0000 mg | ORAL_TABLET | Freq: Two times a day (BID) | ORAL | Status: DC

## 2016-11-20 MED ORDER — ONDANSETRON 4 MG PO TBDP
4.0000 mg | ORAL_TABLET | Freq: Four times a day (QID) | ORAL | Status: DC | PRN
  Filled 2016-11-20: qty 1

## 2016-11-20 MED ORDER — SODIUM CHLORIDE 0.9% FLUSH: 3.0000 mL | INTRAVENOUS | Status: DC | PRN

## 2016-11-20 NOTE — ED Notes (Signed)
Pt belongings transferred with pt to [facility] via [facility]
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
## 2016-11-20 NOTE — ED Notes (Signed)
Patient transported to CT 

## 2016-11-20 NOTE — ED Notes (Signed)
Pt abrasions cleaned with surecleanse, bandage applied to skin tear on right hand, pt tolerated well,

## 2016-11-20 NOTE — ED Notes (Signed)
Pt ambulatory to restroom, gait steady at present time
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
## 2016-11-20 NOTE — ED Provider Notes (Signed)
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 review of radiographic studies, pulse oximetry and  re-evaluation of patient's condition.   Dione Booze, MD 11/20/16 [phone]
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
## 2016-11-20 NOTE — ED Notes (Signed)
Dr Preston Fleeting remains at bedside speaking with pt,
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
## 2016-11-20 NOTE — ED Notes (Signed)
ED Provider at bedside. 

## 2016-11-20 NOTE — ED Notes (Signed)
Pt room air pulse ox remains in mid 80;s, c/o chest pain, Dr Estell Harpin notified and at bedside for exam,

## 2016-11-21 ENCOUNTER — Observation Stay (HOSPITAL_COMMUNITY): Payer: No Typology Code available for payment source

## 2016-11-21 DIAGNOSIS — R05 Cough: Secondary | ICD-10-CM | POA: Diagnosis not present

## 2016-11-21 DIAGNOSIS — R0602 Shortness of breath: Secondary | ICD-10-CM | POA: Diagnosis not present

## 2016-11-21 MED ORDER — ACETAMINOPHEN 325 MG PO TABS: 650.0000 mg | ORAL_TABLET | Freq: Four times a day (QID) | ORAL | Status: DC | PRN

## 2016-11-21 MED ORDER — BACITRACIN ZINC 500 UNIT/GM EX OINT
TOPICAL_OINTMENT | Freq: Two times a day (BID) | CUTANEOUS | Status: DC
  Administered 2016-11-21 – 2016-11-23 (×5): via TOPICAL
  Filled 2016-11-21 (×3): qty 28.35

## 2016-11-21 MED ORDER — ACETAMINOPHEN 325 MG PO TABS
650.0000 mg | ORAL_TABLET | Freq: Four times a day (QID) | ORAL | Status: DC
  Administered 2016-11-21 – 2016-11-23 (×10): 650 mg via ORAL
  Filled 2016-11-21 (×10): qty 2

## 2016-11-21 NOTE — Progress Notes (Signed)
Patient given incentive spirometer and educated on its use

## 2016-11-21 NOTE — Progress Notes (Signed)
SATURATION QUALIFICATIONS: (This note is used to comply with regulatory documentation for home oxygen)  Patient Saturations on Room Air at Rest = 91%  Patient Saturations on Room Air while Ambulating = 86%  Patient Saturations on 2 Liters of oxygen while Ambulating = 94%  Please briefly explain why patient needs home oxygen:Pt unable to maintain oxygen saturation above 90%O2 with ambulation necessitating supplemental oxygen with mobility.  Merrissa Giacobbe B. Beverely Risen PT, DPT Acute Rehabilitation  [phone] Pager [phone]
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
## 2016-11-22 ENCOUNTER — Encounter (HOSPITAL_COMMUNITY): Payer: Self-pay

## 2016-11-22 LAB — CBC
HCT: 38.1 % — ABNORMAL LOW (ref 39.0–52.0)
Hemoglobin: 12.3 g/dL — ABNORMAL LOW (ref 13.0–17.0)
MCH: 32.5 pg (ref 26.0–34.0)
MCHC: 32.3 g/dL (ref 30.0–36.0)
MCV: 100.8 fL — ABNORMAL HIGH (ref 78.0–100.0)
PLATELETS: 143 10*3/uL — AB (ref 150–400)
RBC: 3.78 MIL/uL — AB (ref 4.22–5.81)
RDW: 13.6 % (ref 11.5–15.5)
WBC: 9.2 10*3/uL (ref 4.0–10.5)

## 2016-11-22 LAB — BASIC METABOLIC PANEL
ANION GAP: 11 (ref 5–15)
BUN: 11 mg/dL (ref 6–20)
CALCIUM: 8.4 mg/dL — AB (ref 8.9–10.3)
CO2: 29 mmol/L (ref 22–32)
Chloride: 96 mmol/L — ABNORMAL LOW (ref 101–111)
Creatinine, Ser: 0.8 mg/dL (ref 0.61–1.24)
GLUCOSE: 123 mg/dL — AB (ref 65–99)
Potassium: 3.5 mmol/L (ref 3.5–5.1)
SODIUM: 136 mmol/L (ref 135–145)
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
## 2016-11-22 NOTE — Progress Notes (Signed)
Received pt in bed with staff. Pt alert and oriented. Pt on 2 L O2 per nasal cannula. Denies pain at this time. Oriented with room and use of call light. Will monitor pt.

## 2016-11-22 NOTE — Progress Notes (Signed)
SATURATION QUALIFICATIONS: (This note is used to comply with regulatory documentation for home oxygen)  Patient Saturations on Room Air at Rest = 92%  Patient Saturations on Room Air while Ambulating = 90%  Patient Saturations on 1 Liters of oxygen while Ambulating = 93%  Please briefly explain why patient needs home oxygen:
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
## 2016-11-23 ENCOUNTER — Other Ambulatory Visit: Payer: Self-pay | Admitting: Pharmacist

## 2016-11-23 MED ORDER — BACITRACIN ZINC 500 UNIT/GM EX OINT: TOPICAL_OINTMENT | Freq: Two times a day (BID) | CUTANEOUS | 0 refills | Status: DC

## 2016-11-23 MED ORDER — OXYCODONE HCL 5 MG PO TABS: 5.0000 mg | ORAL_TABLET | Freq: Four times a day (QID) | ORAL | 0 refills | Status: DC | PRN
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
## 2016-11-23 NOTE — Care Management Note (Signed)
Case Management Note  Patient Details  Name: Wesley Ho MRN: [id number] Date of Birth: February 06, 1956  Subjective/Objective:                    Action/Plan:  Discussed discharge planning with patient . Patient understands insurance denied SNF. He is comfortable going home . He has a friend who will provide transportation home. He already has a walker and shower chair at home. He does not want bedside commode. Confirmed face sheet information with patient. Provided choice for home health. He has used [facility] in past and would like them again.   Shanda Bumps with [facility] will order HHPT,OT, and aide.   Referral given to [facility] with [facility] Expected Discharge Date:                  Expected Discharge Plan:  Home w Home Health Services  In-House Referral:  Clinical Social Work  Discharge planning Services  CM Consult  Post Acute Care Choice:    Choice offered to:  Patient  DME Arranged:    DME Agency:     HH Arranged:  PT, OT, Nurse's Aide HH Agency:  [facility]  Status of Service:  Completed, signed off  If discussed at Long Length of Stay Meetings, dates discussed:    Additional Comments:  Kingsley Plan, RN 11/23/2016, 2:15 PM
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
## 2016-11-23 NOTE — Progress Notes (Signed)
Patient discharged to home with instructions and prescription. 
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
## 2016-11-23 NOTE — Discharge Instructions (Addendum)
Rib Fracture A rib fracture is a break or crack in one of the bones of the ribs. The ribs are like a cage that goes around your upper chest. A broken or cracked rib is often painful, but most do not cause other problems. Most rib fractures heal on their own in 1-3 months. Follow these instructions at home:  Avoid activities that cause pain to the injured area. Protect your injured area.  Slowly increase activity as told by your doctor.  Take medicine as told by your doctor.  Put ice on the injured area for the first 1-2 days after you have been treated or as told by your doctor. ? Put ice in a plastic bag. ? Place a towel between your skin and the bag. ? Leave the ice on for 15-20 minutes at a time, every 2 hours while you are awake.  Do deep breathing as told by your doctor. You may be told to: ? Take deep breaths many times a day. ? Cough many times a day while hugging a pillow. ? Use a device (incentive spirometer) to perform deep breathing many times a day.  Drink enough fluids to keep your pee (urine) clear or pale yellow.  Do not wear a rib belt or binder. These do not allow you to breathe deeply. Get help right away if:  You have a fever.  You have trouble breathing.  You cannot stop coughing.  You cough up thick or bloody spit (mucus).  You feel sick to your stomach (nauseous), throw up (vomit), or have belly (abdominal) pain.  Your pain gets worse and medicine does not help. This information is not intended to replace advice given to you by your health care provider. Make sure you discuss any questions you have with your health care provider. Document Released: 11/29/2007 Document Revised: 07/28/2015 Document Reviewed: 04/23/2012 Elsevier Interactive Patient Education  2018 [employer].   What You Need to Know About Prescription Opioid Pain Medicine Opioids are powerful medicines that are used to treat moderate to severe pain. Opioids should be taken with the  supervision of a trained health care provider. They should be taken for the shortest period of time as possible. This is because opioids can be addictive and the longer you take opioids, the greater your risk of addiction (opioid use disorder). What do opioids do? Opioids help reduce or eliminate pain. When used for short periods of time, they can help you:  Sleep better.  Do better in physical or occupational therapy.  Feel better in the first few days after an injury.  Recover from surgery.  What kind of problems can opioids cause? Opioids can cause side effects, such as:  Constipation.  Nausea.  Vomiting.  Drowsiness.  Confusion.  Opioid use disorder.  Breathing difficulties (respiratory depression).  Using opioid pain medicines for longer than 3 days increases your risk of these side effects. Taking opioid pain medicine for a long period of time can affect your ability to do daily tasks. It also puts you at risk for:  Car accidents.  Heart attack.  Overdose, which can sometimes lead to death.  What can increase my risk for developing problems while taking opioids? You may be at an especially high risk for problems while taking opioids if you:  Are over the age of 86.  Are pregnant.  Have kidney or liver disease.  Have certain mental health conditions, such as depression or anxiety.  Have a history of substance use disorder.  Have  had an opioid overdose in the past.  How do I stop taking opioids if I have been taking them for a long time? If you have been taking opioid medicine for more than a few weeks, you may need to slowly stop taking them (taper). Tapering your use of opioids can decrease your chances of experiencing withdrawal symptoms, such as:  Abdominal pain and cramping.  Nausea.  Sweating.  Sleepiness.  Restlessness.  Uncontrollable shaking (tremors).  Cravings for the medicine.  Do not attempt to taper your use of opioids on your  own. Talk with your health care provider about how to do this. Your health care provider may prescribe a step-down schedule based on how much medicine you are taking and how long you have been taking it. What are the benefits of stopping the use of opioids? By switching from opioid pain medicine to non-opioid pain management options, you will decrease your risk of accidents and injuries associated with long-term opioid use. You will also be able to:  Monitor your pain more accurately and know when to seek medical care if it is not improving.  Decrease risk to others around you. Having opioids in the home increases the risk for accidental or intentional use or overdose by others.  How can I treat pain without opioids? Pain can be managed with many types of alternative treatments. Ask your health care provider to refer you to one or more specialists who can help you manage pain through:  Physical or occupational therapy.  Counseling (cognitive-behavioral therapy).  Good nutrition.  Biofeedback.  Massage.  Meditation.  Non-opioid medicine.  Following a gentle exercise program.  Where can I get support? If you have been taking opioids for a long time, you may benefit from receiving support for quitting from a local support group or counselor. Ask your health care provider for a referral to these resources in your area. When should I seek medical care? Seek medical care right away if you are taking opioids and you experience any of the following:  Difficulty breathing.  Breathing that is more shallow or slower than normal.  A very slow heartbeat (pulse).  Severe confusion.  Unconsciousness.  Sleepiness.  Difficulty waking from sleep.  Slurred speech.  Nausea and vomiting.  Cold, clammy skin.  Blue lips or fingernails.  Limpness.  Abnormally small pupils.  If you think that you or someone else may have taken too much of an opioid medicine, get medical help right  away. Do not wait to see if the symptoms go away on their own. Call your local emergency services (911 in the [place] or call the hotline of the [facility] 3056054383 in the [place] Where can I get more information? To learn more about opioid medicines, visit the Centers for Disease Control and Prevention web site Opioid Basics at [employer].pt. Summary  Opioid medicines can help you manage moderate-to-severe pain for a short period of time.  Taking opioid pain medicine for a long period of time puts you at risk for unintentional accidents, injury, and even death.  If you think that you or someone else may have taken too much of an opioid, get medical help right away. This information is not intended to replace advice given to you by your health care provider. Make sure you discuss any questions you have with your health care provider. Document Released: 03/18/2015 Document Revised: 10/14/2015 Document Reviewed: 10/01/2014 Elsevier Interactive Patient Education  2017 [employer].
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
## 2016-11-26 ENCOUNTER — Telehealth: Payer: Self-pay | Admitting: *Deleted

## 2016-11-26 ENCOUNTER — Other Ambulatory Visit: Payer: Self-pay | Admitting: Pharmacist
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
## 2016-11-26 NOTE — Patient Outreach (Signed)
[facility]) Care Management  11/26/2016  Wesley Ho [birth year] [id number]   Patient was called to conduct a medication review post discharge.  Unfortunately, the patient did not answer the phone. HIPAA compliant message left on the patient's voicemail .    Plan:  Call the patient back in 1-3 business days.   [name], PharmD, BCACP [facility] Clinical Pharmacist [phone]

## 2016-11-27 ENCOUNTER — Telehealth: Payer: Self-pay | Admitting: *Deleted

## 2016-11-27 ENCOUNTER — Other Ambulatory Visit: Payer: Self-pay | Admitting: Pharmacist
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
## 2016-11-27 NOTE — Patient Outreach (Signed)
[facility]) Care Management  11/27/2016  MEHUL RUDIN 1955/05/09 [id number]   Coordination of care   [facility] CM collaborated with [facility] pharmacist, Laurelyn Sickle about this patient and need for home visit with him. Mr Dubree has not returned a call to [facility] CM after a call on 11/26/16 to initiate transition of care  [facility] CM called Mr Harvel today and left HIPP compliant message on his home and mobile number  Pending a return call    Plan [facility] CM will call Mr Gilbert again to initiated transition of care and to schedule home visit on next week  Keatyn Luck L. Noelle Penner, RN, BSN, CCM [facility] Care Management [phone]

## 2016-11-29 NOTE — Addendum Note (Signed)
Addended by: [name] on: 11/29/2016 03:49 PM   Modules accepted: Orders

## 2016-11-30 ENCOUNTER — Telehealth: Payer: Self-pay | Admitting: *Deleted

## 2016-11-30 NOTE — Patient Outreach (Signed)
[facility]) Care Management  11/30/2016  Wesley Ho Jul 22, 1955 [id number]   Second HIPAA compliant voice message left with attempt to speak with [name] for transition of care services and possible home visit  Plan:  [facility] CM will make another call attempt and send a letter if no response  Cala Bradford L. Noelle Penner, RN, BSN, CCM [facility] Care Management [phone]

## 2016-12-03 ENCOUNTER — Other Ambulatory Visit: Payer: Self-pay | Admitting: *Deleted

## 2016-12-03 NOTE — Patient Outreach (Signed)
[facility]) Care Management  12/03/2016  ANTWON ROCHIN 1955-07-12 [id number]   Care coordination   [facility] CM attempted to reach Mr Decandia again for third time for transition of care and home visit  No answer  A HIPPA compliant voice message left requesting a return call to [facility] CM mobile number   Plans To send an unsuccessful reach letter to Mr Kostantinos Tallman L. Noelle Penner, RN, BSN, CCM [facility] Care Management [phone]

## 2016-12-05 ENCOUNTER — Other Ambulatory Visit: Payer: Self-pay | Admitting: Pharmacist

## 2016-12-05 NOTE — Patient Outreach (Signed)
[facility]) Care Management  12/05/2016  Wesley Ho [birth year] [id number]   Patient was called today to follow up with him and to schedule a home visit. Unfortunately, the patient did not answer the phone. HIPAA compliant message was left on his voicemail.  Of importance, [facility] Community Nurse, Marval Regal has not been able to reach the patient via telephone.   Plan:  Call patient back in 5-7 business days.   [name], PharmD, BCACP [facility] Clinical Pharmacist [phone]

## 2016-12-07 ENCOUNTER — Other Ambulatory Visit: Payer: Self-pay | Admitting: Pharmacist

## 2016-12-07 NOTE — Patient Outreach (Signed)
[facility]) Care Management  12/07/2016  Wesley Ho [birth year] [id number]  Patient was called today to follow up with him and to schedule a home visit. Unfortunately, the patient did not answer the phone. HIPAA compliant message was left on his voicemail.  Of importance, [facility] Community Nurse, Marval Regal has not been able to reach the patient via telephone.   Plan:  Call patient back in 5-7 business days.   [name], PharmD, BCACP [facility] Clinical Pharmacist [phone]

## 2016-12-12 ENCOUNTER — Encounter: Payer: Self-pay | Admitting: Orthopaedic Surgery

## 2016-12-12 ENCOUNTER — Ambulatory Visit (INDEPENDENT_AMBULATORY_CARE_PROVIDER_SITE_OTHER): Payer: PPO | Admitting: Orthopaedic Surgery

## 2016-12-12 VITALS — BP 107/74 | HR 82 | Temp 97.6°F | Ht 68.0 in | Wt 231.0 lb

## 2016-12-12 DIAGNOSIS — G894 Chronic pain syndrome: Secondary | ICD-10-CM | POA: Diagnosis not present

## 2016-12-12 DIAGNOSIS — G8929 Other chronic pain: Secondary | ICD-10-CM | POA: Diagnosis not present

## 2016-12-12 DIAGNOSIS — M25512 Pain in left shoulder: Secondary | ICD-10-CM | POA: Diagnosis not present

## 2016-12-12 DIAGNOSIS — Z6839 Body mass index (BMI) 39.0-39.9, adult: Secondary | ICD-10-CM | POA: Diagnosis not present

## 2016-12-12 DIAGNOSIS — L309 Dermatitis, unspecified: Secondary | ICD-10-CM | POA: Diagnosis not present

## 2016-12-12 DIAGNOSIS — Z23 Encounter for immunization: Secondary | ICD-10-CM | POA: Diagnosis not present

## 2016-12-12 DIAGNOSIS — F419 Anxiety disorder, unspecified: Secondary | ICD-10-CM | POA: Diagnosis not present

## 2016-12-12 MED ORDER — HYDROCODONE-ACETAMINOPHEN 7.5-325 MG PO TABS: ORAL_TABLET | ORAL | 0 refills | Status: DC
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
## 2016-12-13 ENCOUNTER — Other Ambulatory Visit: Payer: Self-pay | Admitting: Pharmacist

## 2016-12-13 NOTE — Patient Outreach (Signed)
[facility]) Care Management  [facility]   12/13/2016  Wesley Ho 07-28-1955 [id number]  Subjective: Patient was called to follow up.  HIPAA identifiers were obtained.  Patient is a 61 year old male with COPD, history of DVT, ETOH abuse, anxiety, chronic edema, chronic left shoulder pain and hypogonadism.edema and chronic shoulder pain. Patient visited the ED and/or was hospitalized three times in the past two months for issues secondary to polypharmacy.  Patient said he is feeling better and is agreeable to a home visit.  Objective:   Encounter Medications: Outpatient Encounter Prescriptions as of 12/13/2016  Medication Sig Note  . acetaminophen (TYLENOL) 500 MG tablet Take 500-1,000 mg by mouth every 6 (six) hours as needed for mild pain.   Marland Kitchen alprazolam (XANAX) 2 MG tablet Take 0.5 tablets (1 mg total) by mouth 4 (four) times daily as needed. For anxiety. (Patient taking differently: Take 1 mg by mouth 4 (four) times daily. For anxiety.) 11/27/2016: Patient still reports taking 4 tablets daily  . ANDROGEL PUMP 20.25 MG/ACT (1.62%) GEL Use 1 pump in the morning   . aspirin EC 81 MG tablet Take 81 mg by mouth daily.   Marland Kitchen aspirin-sod bicarb-citric acid (ALKA-SELTZER) 325 MG TBEF tablet Take 325 mg by mouth every 6 (six) hours as needed.   . busPIRone (BUSPAR) 10 MG tablet Take 10 mg by mouth 2 (two) times daily.    . Calcium Carb-Cholecalciferol (CALCIUM 1000 + D PO) Take 1 tablet by mouth daily.   . cholecalciferol (VITAMIN D) 1000 units tablet Take 1,000 Units by mouth daily.   . Cyanocobalamin 2500 MCG TABS Take 1 tablet by mouth daily.    . ferrous sulfate (IRON SUPPLEMENT) 325 (65 FE) MG tablet Take 65 mg by mouth daily with breakfast.   . furosemide (LASIX) 20 MG tablet Take 1 tablet by mouth daily.   . hydrochlorothiazide (HYDRODIURIL) 25 MG tablet Take 25 mg by mouth daily.   Marland Kitchen HYDROcodone-acetaminophen (NORCO) 7.5-325 MG tablet One every four hours  for pain as needed.  Do not drive car or operate machinery while taking this medicine.  Must last 14 days.   . Multiple Vitamins-Minerals (HM COMPLETE 50+ MENS ULTIMATE PO) Take 1 tablet by mouth daily.   . Potassium 99 MG TABS Take 99 mg by mouth daily.   . pregabalin (LYRICA) 200 MG capsule Take 200 mg by mouth 3 (three) times daily.   Marland Kitchen Specialty Vitamins Products (MM BIOTIN/KERATIN PO) Take 1 capsule by mouth daily.   . tamoxifen (NOLVADEX) 20 MG tablet Take 20 mg by mouth daily.   . tamsulosin (FLOMAX) 0.4 MG CAPS capsule Take 1 capsule by mouth daily.   . WHEY PROTEIN PO Take by mouth 2 (two) times daily. 12/13/2016: Only takes when going to workout  . [DISCONTINUED] Biotin 5000 MCG TABS Take 5,000 mcg by mouth daily.   . [DISCONTINUED] oxyCODONE (OXY IR/ROXICODONE) 5 MG immediate release tablet Take 1 tablet (5 mg total) by mouth every 6 (six) hours as needed for moderate pain or severe pain. (Patient not taking: Reported on 12/13/2016)    No facility-administered encounter medications on file as of 12/13/2016.     Functional Status: In your present state of health, do you have any difficulty performing the following activities: 11/21/2016 11/20/2016  Hearing? N N  Vision? N N  Difficulty concentrating or making decisions? N N  Walking or climbing stairs? N N  Comment - -  Dressing or bathing? N  N  Doing errands, shopping? - N  Some recent data might be hidden    Fall/Depression Screening: Fall Risk  11/27/2016 11/14/2016 07/02/2016  Falls in the past year? Yes No No  Number falls in past yr: 2 or more - -  Injury with Fall? No - -  Risk for fall due to : Medication side effect - -   PHQ 2/9 Scores 11/27/2016 11/14/2016 07/02/2016 04/02/2016 02/23/2016  PHQ - 2 Score 0 0 0 0 0      Assessment: Patient's medications were reviewed via telephone.  Drugs sorted by system:  Neurologic/Psychologic: Alprazolam--patient stated Dr. Sherwood Gambler decreased the dose to  four times  daily Buspirone Lyrica    Cardiovascular: Aspirin Hydrochlorothiazide Furosemide   Pain: Hydrocodone/APAP Acetaminophen(APAP)   Vitamins/Minerals: Biotin/Keratin Cholecalciferol Ferrous Sulfate Multiple Vitamin Potassium Calcium-Vitamin D Vitamin B 12   Miscellaneous: Alka-Seltzer Tamoxifen Testoterone Gel Tamsulosin   Medication Review Findings:  Therapeutic Duplication: Furosemide/HCTZ- not sure if patient truly needs both diuretics.  If he does, he may need new lab work to check his electrolytes more often.  Additional Therapy May be needed: Potassium-patient is taking potassium over the counter. Since he is taking two diuretics, he is at risk of hypokalemia. OTC potassium is not as potent as prescription potassium.  The OTC Potassium Gluconate  only contains 2.98mEq of elemental potassium per tablet.  Education Provided Patient was educated on not taking more than  or 4G of acetaminophen per day. (Patient is currently taking Hydrocodone/APAP and Acetaminophen daily).  He said he was unaware there was a maximum recommended daily dosage.  Unnecessary Therapy (potentially) Patient is taking quite a few vitamin products. If deemed therapeutically appropriate he may consider discontinuing keratin/biotin vitamin D 3 (as the calcium product he is taking has Vitamin D in it--would need to review the amount)   Plan: Conduct dual home visit with [facility] Community Nurse, Edd Arbour, RN on 12/24/16 Route Note to PCP

## 2016-12-14 ENCOUNTER — Other Ambulatory Visit: Payer: Self-pay | Admitting: *Deleted

## 2016-12-14 NOTE — Patient Outreach (Signed)
[facility]) Care Management  12/14/2016  Wesley Ho [birth year] [id number]   [facility] CM collaborated with [facility] pharmacist Laurelyn Sickle after she was able to reach [name] on 12/13/16   Plans [facility] CM and [facility] pharmacist to meet with [name] for a home visit as agreed on 12/13/16 on 12/24/16   Cala Bradford L. Noelle Penner, RN, BSN, CCM [facility] Care Management [phone]

## 2016-12-17 ENCOUNTER — Ambulatory Visit: Payer: Self-pay | Admitting: Pharmacist

## 2016-12-24 ENCOUNTER — Other Ambulatory Visit: Payer: Self-pay | Admitting: *Deleted

## 2016-12-24 ENCOUNTER — Encounter: Payer: Self-pay | Admitting: Pharmacist

## 2016-12-24 ENCOUNTER — Other Ambulatory Visit: Payer: Self-pay | Admitting: Pharmacist
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
## 2016-12-31 ENCOUNTER — Telehealth: Payer: Self-pay | Admitting: *Deleted

## 2016-12-31 NOTE — Patient Outreach (Signed)
[facility]N) Care Management  12/31/2016  Wesley Ho 04-24-1955 [id number]   Care coordination   [facility] CM left a voice message for Wesley Ho, Neurology referral coordinator for Dr Gerilyn Pilgrimoonquah requesting update on referral status for Wesley Carol[facility] Cm had received a response form Megan at [place] on 12/26/16 about referral assistance provided from Dr Sherwood GamblerFusco to Dr Gerilyn Pilgrimoonquah -"was put in on 12/24/16 so it has already been done and they should be contacting him soon, or if he would like he can call over to their office to check on the status and see about scheduling." No referral availbable per Dr Sherwood GamblerFusco for pcp    Plans Continue to follow up, collaborate with Neurology office for possible pcp and pain management service   \Kimberly L. Noelle PennerGibbs, RN, BSN, CCM [facility] Care Management [phone]

## 2017-01-07 ENCOUNTER — Other Ambulatory Visit: Payer: Self-pay | Admitting: *Deleted

## 2017-01-07 NOTE — Patient Outreach (Signed)
[facility]) Care Management   01/07/2017  Wesley Ho 1955-07-21 [id number]   Subjective:  See below notes   Objective:   BP 110/70   Pulse 76   Temp (!) 96.2 F (35.7 C) (Oral)   Resp 20   SpO2 93%  Review of Systems  Constitutional: Negative for chills, diaphoresis, fever, malaise/fatigue and weight loss.  HENT: Negative.   Eyes: Negative.   Respiratory: Negative.   Cardiovascular: Positive for PND.  Gastrointestinal: Negative.   Genitourinary: Negative.   Musculoskeletal: Positive for joint pain. Negative for back pain, falls, myalgias and neck pain.  Skin: Positive for rash. Negative for itching.  Neurological: Positive for weakness. Negative for dizziness, tingling, tremors, sensory change, speech change, focal weakness, seizures, loss of consciousness and headaches.  Endo/Heme/Allergies: Negative.   Psychiatric/Behavioral: Positive for substance abuse. Negative for depression, hallucinations, memory loss and suicidal ideas. The patient is nervous/anxious. The patient does not have insomnia.     Physical Exam  Constitutional: He is oriented to person, place, and time. He appears well-developed and well-nourished.  HENT:  Head: Normocephalic and atraumatic.  Eyes: Conjunctivae are normal. Pupils are equal, round, and reactive to light.  Neck: Normal range of motion. Neck supple.  Cardiovascular: Normal rate, regular rhythm and normal heart sounds.  Respiratory: Effort normal and breath sounds normal.  GI: Soft. Bowel sounds are normal.  Musculoskeletal: Normal range of motion.  Neurological: He is alert and oriented to person, place, and time.  Skin: Skin is warm and dry. Rash noted.  Flaky skin noted on face, ears  Psychiatric: He has a normal mood and affect. His behavior is normal. Judgment and thought content normal.    Encounter Medications:   Outpatient Encounter Medications as of 01/07/2017  Medication Sig Note  . acetaminophen  (TYLENOL) 500 MG tablet Take 500-1,000 mg by mouth every 6 (six) hours as needed for mild pain.   Marland Kitchen. alprazolam (XANAX) 2 MG tablet Take 0.5 tablets (1 mg total) by mouth 4 (four) times daily as needed. For anxiety. 12/24/2016: Patient is finishing the last of his Alprazolam 2mg  1 tablet qid prescription.    . ANDROGEL PUMP 20.25 MG/ACT (1.62%) GEL Use 1 pump in the morning   . aspirin EC 81 MG tablet Take 81 mg by mouth daily.   Marland Kitchen. aspirin-sod bicarb-citric acid (ALKA-SELTZER) 325 MG TBEF tablet Take 325 mg by mouth every 6 (six) hours as needed.   . busPIRone (BUSPAR) 10 MG tablet Take 10 mg by mouth 2 (two) times daily.    . Calcium Carb-Cholecalciferol (CALCIUM 1000 + D PO) Take 1 tablet by mouth daily.   . carboxymethylcellulose (REFRESH TEARS) 0.5 % SOLN 1 drop daily as needed.   . cholecalciferol (VITAMIN D) 1000 units tablet Take 1,000 Units by mouth daily.   . Cyanocobalamin 2500 MCG TABS Take 1 tablet by mouth daily.    . ferrous sulfate (IRON SUPPLEMENT) 325 (65 FE) MG tablet Take 65 mg by mouth daily with breakfast.   . furosemide (LASIX) 20 MG tablet Take 1 tablet by mouth daily.   . hydrochlorothiazide (HYDRODIURIL) 25 MG tablet Take 25 mg by mouth daily.   Marland Kitchen. HYDROcodone-acetaminophen (NORCO) 7.5-325 MG tablet One every four hours for pain as needed.  Do not drive car or operate machinery while taking this medicine.  Must last 14 days.   . Multiple Vitamins-Minerals (HM COMPLETE 50+ MENS ULTIMATE PO) Take 1 tablet by mouth daily.   . Potassium 99 MG  TABS Take 99 mg by mouth daily.   . pregabalin (LYRICA) 200 MG capsule Take 200 mg by mouth 3 (three) times daily.   Marland Kitchen Specialty Vitamins Products (MM BIOTIN/KERATIN PO) Take 1 capsule by mouth daily.   . tamoxifen (NOLVADEX) 20 MG tablet Take 20 mg by mouth daily.   . tamsulosin (FLOMAX) 0.4 MG CAPS capsule Take 1 capsule by mouth daily.   . WHEY PROTEIN PO Take by mouth 2 (two) times daily. 12/13/2016: Only takes when going to workout    No facility-administered encounter medications on file as of 01/07/2017.     Functional Status:   In your present state of health, do you have any difficulty performing the following activities: 11/21/2016 11/20/2016  Hearing? N N  Vision? N N  Difficulty concentrating or making decisions? N N  Walking or climbing stairs? N N  Comment - -  Dressing or bathing? N N  Doing errands, shopping? - N  Some recent data might be hidden    Fall/Depression Screening:    Fall Risk  12/24/2016 11/27/2016 11/14/2016  Falls in the past year? Yes Yes No  Number falls in past yr: 2 or more 2 or more -  Injury with Fall? Yes No -  Risk Factor Category  High Fall Risk - -  Risk for fall due to : History of fall(s);Impaired balance/gait;Impaired mobility;Medication side effect;Mental status change Medication side effect -  Follow up Education provided;Falls prevention discussed - -   PHQ 2/9 Scores 12/24/2016 11/27/2016 11/14/2016 07/02/2016 04/02/2016 02/23/2016  PHQ - 2 Score 2 0 0 0 0 0  PHQ- 9 Score 3 - - - - -    Assessment:    THN CM visited Wesley Ho in his home today He is sitting on his couch with various bills and papers on his couch, floor and table  Wesley Ho reports he has a "court date in December" 2018  He reports he is scheduled to see Dr Lenn Sink but needs to obtain labs before the office visit.  At a lab of his choice   Today we concentrated again on his bills, medications and upcoming appointments  Plan: Follow up with Wesley Ho in 2-3 weeks  Route note to care team members   [place] L. Noelle Penner, RN, BSN, CCM [facility] Care Management [phone]

## 2017-01-09 ENCOUNTER — Telehealth: Payer: Self-pay | Admitting: "Endocrinology

## 2017-01-09 MED ORDER — ANDROGEL PUMP 20.25 MG/ACT (1.62%) TD GEL: TRANSDERMAL | 0 refills | Status: DC

## 2017-01-09 NOTE — Telephone Encounter (Signed)
Wesley Ho is asking for a refill on his ANDROGEL PUMP 20.25 MG/ACT (1.62%) GEL please advise?

## 2017-01-10 ENCOUNTER — Telehealth (HOSPITAL_COMMUNITY): Payer: Self-pay | Admitting: *Deleted

## 2017-01-10 ENCOUNTER — Other Ambulatory Visit: Payer: Self-pay | Admitting: Pharmacist

## 2017-01-10 ENCOUNTER — Other Ambulatory Visit: Payer: Self-pay | Admitting: *Deleted

## 2017-01-10 NOTE — Telephone Encounter (Signed)
phone call this morning, spoke with patient to schedule an appointment.  Told him that we can schedule, but our provider will not prescribe Xanax.  Mr. Wesley Ho said, then it is no point in scheduling.

## 2017-01-10 NOTE — Patient Outreach (Signed)
[facility]N) Care Management  01/10/2017  Wesley Ho 15-Feb-1956 [id number]   Care coordination   [facility] received a voice message from Wesley Ho of [facility] to make [facility] aware that Wesley Ho was called to schedule an appointment today and she explained to him that their provider would not provide xanax Wesley Ho informed Wesley Ho "it is no use to schedule an appointment"  [facility] then received a message from Wesley Ho a few minutes later to inform Wesley Ho that Wesley Ho called to offer an appointment but xanax would not be prescribed.  He informed Wesley Ho that he informed Wesley Ho "well there is no point in pursuing it.  That is something that is absolutely essential for me.", "for twenty five years and I think my body is getting dependent on it and it is getting more and more urgent."  Wesley Ho returned a call to Wesley Ho without success after dialing his number x 2, then a voice message was left strongly encouraging him to return a call to Wesley Ho at [name] to schedule an appointment and reminding him that they can provide psychology services he stated he preferred on the first home visit to "discuss" his anxiety triggers or causes and psychiatry services to manage medications that may be needed for anxiety in the future.  Wesley Ho then left another voice message to request to talk to Wesley Ho about HTA billing after Wesley Ho had encouraged him to call a HTA connoisseur He voiced concern about "confused" about whether a bill was paid or not for 11/21/16. He stated this was causing him anxiety  [facility] Wesley Ho returned a call to Wesley Ho.  [facility] Wesley Ho reminded him that the explanation of benefits (EOBs) statements he receieves are not bills therefore he is not being charged the amounts noted. He denies receiving a bill. Encouraged him again to call HTA connoisseur and to ask to speak with a supervisor if he had confusion. Wesley Ho has called on 01/08/17 also about the same HTA billing issues. Wesley Ho had  referred him and coached him on how to call HTA and the questions to ask as was done on 01/07/17. He confirmed none of the information stated they were bills. Wesley Ho had discussed EOBs and that they are not bills.  [facility] Wesley Ho again encouraged him to return a call to [facility] to schedule an appointment and to call another in network pcp if he feels he needs xanax before 02/18/17. Wesley Ho again explained the differences in services for psychology and psychiatry. Wesley Ho recommended he needs a psychology and psychiatry based upon his statement "for twenty five years and I think my body is getting dependent on it and it is getting more and more urgent." Wesley Ho encouraged him also to contact another one of the listed in network psychiatrists on the resource sheet provided by Wesley Ho during the first home visit if he prefers.   Wesley Ho discussed the standard FDA recommended dose of xanax for anxiety maximum (4 mg daily) and referred Wesley Ho back to his discussion with previous pcp , Fusco.  Wesley Ho denied a discussion of recommended dose for xanax  He states he does not feel he would need to call another in network pcp provider on the resource list Wesley Ho left with him because he should be okay with xanax until his 02/18/17 appointment to see Dr Margo AyeHall. Wesley Ho reminded him of other available in network local pcps on the resource list.  Wesley Ho explained to Wesley Ho  that Wesley Ho has provide the resources he needs to use to get assistance with a new pcp, psychiatrist, psychologist and HTA billing concerns. Wesley Ho has initiated all initial calls and referrals to these providers for him per his preferences. Wesley Ho discussed with him that he will need to assist with further calls.   Wesley Ho discussed his plan of care for new  pcp, psychiatrist, psychologist and fall risks. Encouraged him today to call HTA and [name]  Plan [facility] Wesley [facility] returned a call to Wesley Ho to thank her for the updated. Wesley Ho informed her that Wesley Ho spoke with Wesley Ho and encouraged  him to return a call because of a need for psychologist and  Psychiatrist services  [facility] with [facility], Wesley Ho about today calls from Wesley Ho, BH and his plan of care. Email sent to [facility] leadership Route note to pertinent care team members  Follow up with Wesley Ho in 2 weeks or prn   Wesley Ho L. Wesley PennerGibbs, RN, BSN, CCM [facility] Care Management [phone]

## 2017-01-10 NOTE — Patient Outreach (Signed)
[facility](THN) Care Management  01/10/2017  Wesley Ho [birth year] [id number]   Called patient to follow up with him. Unfortunately, patient did not answer the phone. HIPAA compliant message was left on his voicemail.  Review of patient's chart showed patient decline a behavioral health appointment with a new provider due to the provider stating he would not write for alprazolam.   Plan:  Call patient back in 5-7 business days.  Beecher McardleKatina J. Zakkery Dorian, PharmD, BCACP [facility] Clinical Pharmacist [phone]

## 2017-01-10 NOTE — Telephone Encounter (Signed)
returned phone call to Marval RegalKim Gibbs who referred patient to us.   Informed her of patient's decision not to schedule an appointment, due to our provider will not prescribe Xanax.   He said it is no point in scheduling.

## 2017-01-14 ENCOUNTER — Other Ambulatory Visit: Payer: Self-pay | Admitting: *Deleted

## 2017-01-14 NOTE — Patient Outreach (Signed)
[facility](THN) Care Management  01/14/2017  Wesley Ho 1955/09/04 [id number]   CSW made an initial attempt to try and contact patient today to perform phone assessment, as well as assess and assist with social needs and services, without success. A HIPPA compliant message was left for patient on voicemail (ph#: [phone]). CSW is currently awaiting a return call. CSW will make a second outreach attempt within the next week, if CSW does not receive a return call from patient in the meantime.    Wesley MaxinKelly Ajooni Karam, LCSW [facility]  Clinical Social Worker cell #: [phone]

## 2017-01-16 ENCOUNTER — Ambulatory Visit: Payer: Self-pay | Admitting: Pharmacist

## 2017-01-17 ENCOUNTER — Other Ambulatory Visit: Payer: Self-pay | Admitting: *Deleted

## 2017-01-18 ENCOUNTER — Ambulatory Visit: Payer: Self-pay | Admitting: Pharmacist
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
## 2017-01-21 ENCOUNTER — Other Ambulatory Visit: Payer: Self-pay | Admitting: *Deleted

## 2017-01-21 ENCOUNTER — Other Ambulatory Visit: Payer: Self-pay

## 2017-01-21 ENCOUNTER — Other Ambulatory Visit: Payer: Self-pay | Admitting: Pharmacist

## 2017-01-21 DIAGNOSIS — L309 Dermatitis, unspecified: Secondary | ICD-10-CM | POA: Diagnosis not present
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
## 2017-01-21 NOTE — Patient Outreach (Signed)
Request received from Kelly Harrison, LCSW to mail patient personal care resources.  Information mailed today. 

## 2017-01-21 NOTE — Patient Outreach (Signed)
[facility]) Care Management  01/21/2017  Chuck Hintdward W Gasper 1956/02/13 [id number]   Patient was called to follow up on medications. HIPAA identifiers were obtained. Patient was in the middle of a home visit with [facility] Community Nurse, Edd ArbourKimberly Gibbs. It was confirmed while on the phone with the patient that he will be starting the step down of the Alprazolam from 2mg  to 1mg  four times daily next week. This step down was ordered at discharge in August.  Patient has several provider visits coming up with pain management and psychiatry.  Since he was in the middle of a home visit, patient said he would try to call me back. Patient has an appointment today and may be traveling for the holiday. As such, I will call him next week to follow up.   Beecher McardleKatina J. Clessie Karras, PharmD, BCACP [facility] Clinical Pharmacist [phone]

## 2017-01-22 ENCOUNTER — Encounter: Payer: Self-pay | Admitting: Pharmacist

## 2017-01-22 ENCOUNTER — Other Ambulatory Visit: Payer: Self-pay | Admitting: *Deleted

## 2017-01-22 NOTE — Patient Outreach (Signed)
[facility]N) Care Management  01/22/2017  Wesley Ho 30-Jul-1955 [id number]   Care coordination  CM received a call form Wesley Ho to discuss upcoming labs needed for Wesley Ho and Wesley Ho. CM assisted by offering suggestion on completing labs for both providers at the same time and taking lab sheets to his preferred lab "[facility]" He is coordinating contact with [facility] for transportation to lab CM spoke with Wesley Ho [facility] pharmacist after Wesley Ho returned a call to her (She had called during 01/21/17 home visit and Wesley Ho agreed to call her back) Updated and discussed Wesley Ho plan of care and concerns with medications like Lyrica, Hydrocodone, Xanax and buspar.   Plans CM plan to follow up with Wesley Ho in 3-4 weeks CM will continue to collaborate with [facility] pharmac[facility]  Wesley Ho L. Noelle PennerGibbs, RN, BSN, CCM [facility] Care Management [phone]

## 2017-01-23 ENCOUNTER — Ambulatory Visit: Payer: PPO | Admitting: Orthopaedic Surgery

## 2017-01-23 ENCOUNTER — Encounter: Payer: Self-pay | Admitting: Orthopaedic Surgery

## 2017-01-23 DIAGNOSIS — M25512 Pain in left shoulder: Secondary | ICD-10-CM | POA: Diagnosis not present

## 2017-01-23 DIAGNOSIS — G8929 Other chronic pain: Secondary | ICD-10-CM | POA: Diagnosis not present

## 2017-01-23 MED ORDER — HYDROCODONE-ACETAMINOPHEN 7.5-325 MG PO TABS: ORAL_TABLET | ORAL | 0 refills | Status: DC

## 2017-01-23 NOTE — Progress Notes (Signed)
PROCEDURE NOTE:  The patient request injection, verbal consent was obtained.  The left shoulder was prepped appropriately after time out was performed.   Sterile technique was observed and injection of 1 cc of Depo-Medrol 40 mg with several cc's of plain xylocaine. Anesthesia was provided by ethyl chloride and a 20-gauge needle was used to inject the shoulder area. A posterior approach was used.  The injection was tolerated well.  A band aid dressing was applied.  The patient was advised to apply ice later today and tomorrow to the injection sight as needed.  Return in one month.  I have reviewed the [place] Controlled Substance Reporting System web site prior to prescribing narcotic medicine for this patient.  Electronically Signed Darreld McleanWayne Teola Felipe, MD 11/21/201810:13 AM

## 2017-01-28 DIAGNOSIS — G4709 Other insomnia: Secondary | ICD-10-CM | POA: Diagnosis not present

## 2017-01-28 DIAGNOSIS — M545 Low back pain: Secondary | ICD-10-CM | POA: Diagnosis not present

## 2017-01-28 DIAGNOSIS — F064 Anxiety disorder due to known physiological condition: Secondary | ICD-10-CM | POA: Diagnosis not present

## 2017-01-28 DIAGNOSIS — G603 Idiopathic progressive neuropathy: Secondary | ICD-10-CM | POA: Diagnosis not present

## 2017-01-29 ENCOUNTER — Other Ambulatory Visit: Payer: Self-pay | Admitting: Pharmacist
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
## 2017-01-30 ENCOUNTER — Ambulatory Visit: Payer: Self-pay | Admitting: Pharmacist

## 2017-02-08 DIAGNOSIS — E291 Testicular hypofunction: Secondary | ICD-10-CM | POA: Diagnosis not present

## 2017-02-09 LAB — HEPATIC FUNCTION PANEL
AG Ratio: 1.1 (calc) (ref 1.0–2.5)
ALBUMIN MSPROF: 3.6 g/dL (ref 3.6–5.1)
ALT: 16 U/L (ref 9–46)
AST: 22 U/L (ref 10–35)
Alkaline phosphatase (APISO): 38 U/L — ABNORMAL LOW (ref 40–115)
BILIRUBIN DIRECT: 0.1 mg/dL (ref 0.0–0.2)
GLOBULIN: 3.2 g/dL (ref 1.9–3.7)
Indirect Bilirubin: 0.3 mg/dL (calc) (ref 0.2–1.2)
TOTAL PROTEIN: 6.8 g/dL (ref 6.1–8.1)
Total Bilirubin: 0.4 mg/dL (ref 0.2–1.2)

## 2017-02-09 LAB — CBC WITH DIFFERENTIAL/PLATELET
BASOS PCT: 0.5 %
Basophils Absolute: 38 cells/uL (ref 0–200)
EOS PCT: 7.5 %
Eosinophils Absolute: 570 cells/uL — ABNORMAL HIGH (ref 15–500)
HCT: 39.6 % (ref 38.5–50.0)
Hemoglobin: 13.4 g/dL (ref 13.2–17.1)
Lymphs Abs: 2196 cells/uL (ref 850–3900)
MCH: 33 pg (ref 27.0–33.0)
MCHC: 33.8 g/dL (ref 32.0–36.0)
MCV: 97.5 fL (ref 80.0–100.0)
MONOS PCT: 7.2 %
MPV: 12.3 fL (ref 7.5–12.5)
NEUTROS PCT: 55.9 %
Neutro Abs: 4248 cells/uL (ref 1500–7800)
PLATELETS: 143 10*3/uL (ref 140–400)
RBC: 4.06 10*6/uL — AB (ref 4.20–5.80)
RDW: 13.4 % (ref 11.0–15.0)
TOTAL LYMPHOCYTE: 28.9 %
WBC: 7.6 10*3/uL (ref 3.8–10.8)
WBCMIX: 547 {cells}/uL (ref 200–950)

## 2017-02-09 LAB — PSA: PSA: 2 ng/mL (ref <=4.0)

## 2017-02-11 ENCOUNTER — Other Ambulatory Visit: Payer: Self-pay | Admitting: *Deleted

## 2017-02-11 NOTE — Patient Outreach (Signed)
[facility]) Care Management  02/11/2017  Chuck Hintdward W Jesson 08-24-1955 [id number]   CSW attempted to reach patient to follow-up on advance directives and patient's upcoming appointments, specifically [facility] [place]. CSW was able to leave HIPPA compliant voicemail on his home #: [phone] & will await returned call, otherwise will try again within a week.    Lincoln MaxinKelly Yannet Rincon, LCSW [facility]  Clinical Social Worker cell #: [phone]
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
## 2017-02-14 ENCOUNTER — Ambulatory Visit: Payer: PPO | Admitting: "Endocrinology

## 2017-02-15 ENCOUNTER — Other Ambulatory Visit: Payer: Self-pay | Admitting: *Deleted

## 2017-02-15 ENCOUNTER — Ambulatory Visit (HOSPITAL_COMMUNITY): Payer: PPO | Admitting: Psychiatry

## 2017-02-15 ENCOUNTER — Ambulatory Visit: Payer: Self-pay | Admitting: *Deleted

## 2017-02-15 NOTE — Patient Outreach (Signed)
[facility]) Care Management  02/15/2017  Wesley Ho 1955-09-17 [id number]   Care coordination  Wesley Wesley Ho called Cm to inform her on 02/14/17 that [facility]17 to 03/14/17 at 0900 related to issues with [facility]. After Cm assessed the issues more (ex wife, Benedetto Goaduber or cab possible could transport) he admitted he is to be assisting his ex wife  02/15/17 for a colonoscopy.  CM counseled Wesley Wesley Ho on compliance and Mental health services being an important part of his treatment plan also. Cm inquired about him having He informs CM he has enough medications at his home for anxiety because he was offered a Rx by his neurologist's PA, "Rolm Bookbinderyeshia" He is also noted with past and future appointments with Dr Hilda LiasKeeling He did discuss with CM on 02/14/17 that he had "found out why Dr Sherwood GamblerFusco let me go" CM [facility] When Cm spoke with him on 12/14/198 and asked how his ex wife was doing, he informed Cm he was not assisting his wife but was at home awaiting "[facility] to be here shortly" to take him to a "dental appointment" Cm discussed his new patient appointment with Dr Hughie ClossZ Hall on 02/18/17 at 1050.  He looked at his calendar to confirm he had called [facility] for the appointment and their arrival time would be 02/18/17 at 0950 to pick him up. Cm informed Wesley Wesley Ho she would see him at this 02/18/17 appointment Left a message at Dr Margo AyeHall office to confirm Wesley Ho's 02/18/17 appointment Pending a return call from staff at Dr Scharlene GlossHall's office    Plans follow up with Wesley Wesley Ho in 1-2 weeks Route note to care team members   Cala BradfordKimberly L. Noelle PennerGibbs, RN, BSN, CCM [facility] Care [facility] [phone]

## 2017-02-18 ENCOUNTER — Other Ambulatory Visit: Payer: Self-pay | Admitting: *Deleted

## 2017-02-18 ENCOUNTER — Other Ambulatory Visit: Payer: Self-pay

## 2017-02-18 DIAGNOSIS — Z0000 Encounter for general adult medical examination without abnormal findings: Secondary | ICD-10-CM | POA: Diagnosis not present

## 2017-02-18 DIAGNOSIS — N400 Enlarged prostate without lower urinary tract symptoms: Secondary | ICD-10-CM | POA: Diagnosis not present

## 2017-02-18 DIAGNOSIS — F419 Anxiety disorder, unspecified: Secondary | ICD-10-CM | POA: Diagnosis not present

## 2017-02-18 DIAGNOSIS — N62 Hypertrophy of breast: Secondary | ICD-10-CM | POA: Diagnosis not present

## 2017-02-18 NOTE — Patient Outreach (Signed)
[facility]) Care Management  02/18/2017  Chuck Hintdward W Brocious [birth year] [id number]   CSW called & spoke with patient to follow-up on community resources. Patient informed CSW that he went to his appointment today with his new PCP - Dr. Dwana MelenaZack Hall and patient reports that he felt very good about switching doctors. CSW spoke with patient about advance directives - patient states that he has turned a lot of friends and his children away and regrets that as he now has very little support. Patient states that he will accept the information but will fill it out once he has decided later on. Patient states that his ex-wife, Toniann FailWendy lives right next door and helps take him out to run errands and such but is unsure whether to make her his healthcare power of attorney.  Patient states that he [facility], but is looking forward to his appointment in January and will then see if they recommend that he take xanax, patient states that Dr. Margo AyeHall is awaiting their recommendation before writing a prescription.   CSW will have CMA mail information on advance directives & CSW will follow-up in 2 weeks.    Lincoln MaxinKelly Jozalyn Baglio, LCSW [facility]  Clinical Social Worker cell #: [phone]
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
## 2017-02-19 ENCOUNTER — Other Ambulatory Visit: Payer: Self-pay | Admitting: Pharmacist

## 2017-02-19 ENCOUNTER — Ambulatory Visit: Payer: PPO | Admitting: Urology

## 2017-02-19 DIAGNOSIS — N5201 Erectile dysfunction due to arterial insufficiency: Secondary | ICD-10-CM | POA: Diagnosis not present

## 2017-02-19 DIAGNOSIS — N35811 Other urethral stricture, male, meatal: Secondary | ICD-10-CM

## 2017-02-19 DIAGNOSIS — E291 Testicular hypofunction: Secondary | ICD-10-CM

## 2017-02-19 NOTE — Patient Outreach (Signed)
[facility]) Care Management  02/19/2017  Wesley Ho 10-10-1955 [id number]   Patient was called to follow up with him after his most recent PCP visit. HIPAA identifiers were obtained.  Patient confirmed that he went to his new provider's office. He said his buspirone 10mg   dose was increased from BID to TID.  Patient also confirmed that he has a prescription for diazepam from Dr. Cherly Hensenonquah's PA but he is not going to fill it. He will continue Alprazolam for now and begin to taper after the Buspirone dose has been optimized.  Patient's medications were reviewed via telephone:  Medications Reviewed Today    Reviewed by Beecher McardleBoyd, Jigar Zielke J, [facility] Inc[facility]PH (Pharmacist) on 02/19/17 at 1616  Med List Status: <None>  Medication Order Taking? Sig Documenting Provider Last Dose Status Informant  acetaminophen (TYLENOL) 500 MG tablet [id number] Yes Take 500-1,000 mg by mouth every 6 (six) hours as needed for mild pain. [provider] Taking Active Self  ALPRAZolam Prudy Feeler(XANAX) 1 MG tablet [id number] Yes Take 1 mg by mouth at bedtime as needed for anxiety (every 6 hours as needed ordered). Elfredia NevinsFusco, Lawrence, MD Taking Active Self  ANDROGEL PUMP 20.25 MG/ACT (1.62%) GEL [id number] Yes Use 1 pump in the morning Roma KayserNida, Gebreselassie W, MD Taking Active   aspirin EC 81 MG tablet [id number] Yes Take 81 mg by mouth daily. [provider] Taking Active Self  aspirin-sod bicarb-citric acid (ALKA-SELTZER) 325 MG TBEF tablet [id number] Yes Take 325 mg by mouth every 6 (six) hours as needed. [provider] Taking Active Self  busPIRone (BUSPAR) 10 MG tablet [id number] Yes Take 10 mg by mouth 3 (three) times daily.  [provider] Taking Active Self           Med Note Primus Bravo(Paiten Boies J   Thu Dec 13, 2016 12:52 PM)    Calcium Carb-Cholecalciferol (CALCIUM 1000 + D PO) [id number] Yes Take 1 tablet by mouth daily. [provider] Taking Active   carboxymethylcellulose (REFRESH  TEARS) 0.5 % SOLN [id number] Yes 1 drop 2 (two) times daily as needed.  [provider] Taking Active Self  cholecalciferol (VITAMIN D) 1000 units tablet [id number] Yes Take 1,000 Units by mouth daily. [provider] Taking Active Self  clobetasol cream (TEMOVATE) 0.05 % [id number] Yes Apply to the affected area as directed [provider] Taking Active   Cyanocobalamin 2500 MCG TABS [id number] Yes Take 1 tablet by mouth daily.  [provider] Taking Active   diazepam (VALIUM) 5 MG tablet [id number] No Take 5 mg by mouth 3 (three) times daily. Jorge MandrilPowell, Ayeshia, NP Not Taking Active            Med Note Daune Perch(Tarae Wooden J   Tue Jan 29, 2017 11:30 AM) New prescription written.  Patient has not picked it up yet--replaces alprazolam  ferrous sulfate (IRON SUPPLEMENT) 325 (65 FE) MG tablet [id number] Yes Take 65 mg by mouth daily with breakfast. [provider] Taking Active Self  furosemide (LASIX) 20 MG tablet [id number] Yes Take 1 tablet by mouth daily. Elfredia NevinsFusco, Lawrence, MD Taking Active   hydrochlorothiazide (HYDRODIURIL) 25 MG tablet [id number] Yes Take 25 mg by mouth daily. [provider] Taking Active Self  HYDROcodone-acetaminophen (NORCO) 7.5-325 MG tablet [id number] Yes One every four hours for pain as needed.  Do not drive car or operate machinery while taking this medicine.  Must last 14 days. Darreld McleanKeeling, Wayne, MD Taking Active   Multiple Vitamins-Minerals (HM COMPLETE 50+ MENS ULTIMATE PO) [id number]  Yes Take 1 tablet by mouth daily. [provider] Taking Active Self  Papaverine-Alprostadil 30-10 MG-MCG/ML SOLN [id number] Yes by Intracavernosal route. [provider] Taking Active   Potassium 99 MG TABS [id number] Yes Take 99 mg by mouth daily. [provider] Taking Active Self  pregabalin (LYRICA) 200 MG capsule [id number] Yes Take 200 mg by mouth 3 (three) times daily. [provider] Taking Active Self  Specialty  Vitamins Products (MM BIOTIN/KERATIN PO) [id number] Yes Take 1 capsule by mouth daily. [provider] Taking Active   tamoxifen (NOLVADEX) 20 MG tablet [id number] Yes Take 20 mg by mouth daily. [provider] Taking Active Self           Med Note [name], YOLINDA T   Tue Jan 10, 2016  8:55 AM)    tamsulosin (FLOMAX) 0.4 MG CAPS capsule [id number] Yes Take 1 capsule by mouth daily. Elfredia NevinsFusco, Lawrence, MD Taking Active   WHEY PROTEIN PO [id number] No Take by mouth 2 (two) times daily. [provider] Not Taking Active Self           Med Note Primus Bravo(Yichen Gilardi J   Thu Dec 13, 2016 12:54 PM) Only takes when going to workout         Plan:  Patient is being followed closely by [facility] Community Nurse, Edd ArbourKimberly Gibbs  I will follow up with the patient in 14-21 days.  Close pharmacy case.  Beecher McardleKatina J. Jaelan Rasheed, PharmD, BCACP [facility] Clinical Pharmacist [phone]

## 2017-02-19 NOTE — Patient Outreach (Signed)
Request received from Kelly Harrison, LCSW to mail patient personal care resources.  Information mailed today. 

## 2017-02-20 ENCOUNTER — Encounter: Payer: Self-pay | Admitting: Orthopaedic Surgery

## 2017-02-20 ENCOUNTER — Other Ambulatory Visit: Payer: Self-pay | Admitting: *Deleted

## 2017-02-20 ENCOUNTER — Ambulatory Visit: Payer: PPO | Admitting: Orthopaedic Surgery

## 2017-02-20 DIAGNOSIS — G8929 Other chronic pain: Secondary | ICD-10-CM | POA: Diagnosis not present

## 2017-02-20 DIAGNOSIS — M25512 Pain in left shoulder: Secondary | ICD-10-CM

## 2017-02-20 MED ORDER — HYDROCODONE-ACETAMINOPHEN 7.5-325 MG PO TABS: ORAL_TABLET | ORAL | 0 refills | Status: DC

## 2017-02-20 NOTE — Patient Outreach (Signed)
[facility]) Care Management  02/20/2017  Wesley Ho 1955/05/30 [id number]   Care coordination  [facility] CM received a voice message from Mr Wesley Ho requesting assistance with attempting to identify a possible appointment he believes he has on March 24, 2017 Reports he wrote it down but can not recall what he wrote.  Also states he recognizes that he has "lots of doctors". [facility] CM has provided him with a [facility] calendar to keep all his appointments coordinated but has noted during previous home visits he joints information down on slips of paper throughout his living room.   Epic indicates only that he has an appointment today with Dr Hilda LiasKeeling and his upcoming January [facility] and pharmacists States he was "comforted" to see CM at his recent new pcp appointment and believes he will be comfortable with his new pcp Reports he has an appointment today at 1330 with Dr Hilda LiasKeeling and will be using [facility] to get to that appointment    Plans  [facility] CM returned Mr Wesley Ho call and left a vice message briefly stating CM not noting a March 24 2016 appointment and encouraged him to call his providers that are not accessible in Epic. Encouraged a return call to CM if needed  [facility] CM will follow up with Mr Wesley Ho prn and in 3-4 week for MD appointment CM collaborated with [facility], Wesley Ho  about Mr Wesley Ho medication changes on 02/19/17 plus an update on new pcp appointment conclusion CM collaborated with [facility] Talmage NapK Harrison, on 02/18/17 after new pcp  Appointment to provide and update on new pcp appointment conclusion CM sent [facility] MD involvement barrier etter to [employer] Pcp   Breslin Hemann L. Noelle PennerGibbs, RN, BSN, CCM [facility] Care Management [phone]

## 2017-02-20 NOTE — Progress Notes (Signed)
PROCEDURE NOTE:  The patient request injection, verbal consent was obtained.  The left shoulder was prepped appropriately after time out was performed.   Sterile technique was observed and injection of 1 cc of Depo-Medrol 40 mg with several cc's of plain xylocaine. Anesthesia was provided by ethyl chloride and a 20-gauge needle was used to inject the shoulder area. A posterior approach was used.  The injection was tolerated well.  A band aid dressing was applied.  The patient was advised to apply ice later today and tomorrow to the injection sight as needed.  I have reviewed the [place] Controlled Substance Reporting System web site prior to prescribing narcotic medicine for this patient.  Electronically Signed Darreld McleanWayne Denora Wysocki, MD 12/19/20181:42 PM

## 2017-02-21 ENCOUNTER — Other Ambulatory Visit: Payer: Self-pay | Admitting: "Endocrinology

## 2017-02-22 MED ORDER — ANDROGEL PUMP 20.25 MG/ACT (1.62%) TD GEL: TRANSDERMAL | 0 refills | Status: DC

## 2017-03-04 ENCOUNTER — Other Ambulatory Visit: Payer: Self-pay | Admitting: *Deleted

## 2017-03-04 NOTE — Patient Outreach (Signed)
[facility]) Care Management  03/04/2017  Wesley Ho Nov 03, 1955 [id number]   CSW attempted to reach patient to follow-up on advance directives & community resources but no answer [phone]. CSW left HIPPA compliant voicemail and will try again within 2 weeks. Noted that patient is scheduled for visit with [facility] on 03/14/17.    Lincoln MaxinKelly Chrisa Hassan, LCSW [facility]  Clinical Social Worker cell #: [phone]

## 2017-03-06 ENCOUNTER — Other Ambulatory Visit: Payer: Self-pay | Admitting: *Deleted

## 2017-03-06 ENCOUNTER — Other Ambulatory Visit: Payer: Self-pay | Admitting: Pharmacist

## 2017-03-06 NOTE — Patient Outreach (Signed)
Wesley Ho) Care Management  03/06/2017  Wesley Ho 06-04-1955 [id number]   Care coordination   CM noted calls but no messages from Wesley Ho on Sunday 03/03/17 and Monday 03/04/17 (holiday) upon Cm return to office CM received a voice message on today 03/06/17 voicing his appreciation for CM assistance in 2018 and stating he needed to speak with CM about his medication changes for xanax and Buspar."I am trying to work with this medication the best I can." "pretty extreme cut and I just wanted to know if there ws anything we could do about it."  Cm referred him to  pharmacist and his Primary care provider office staff to manage medicine changes.   CM assessed his s/s   He reports "queasy", anxiety, tremors and poor sleep for 1 week with use of xanax 1 mg and Buspar at reduced dosages. Wesley Ho had been taking xanax 2 mg tabs.   Cm discussed the s/s are indicative of his decrease in medicines He is questioning if he should fill the valium Rx he has.   Cm consulted with pharmacist, Wesley Ho and Wesley Ho at Dr Juel Burrow office for recommendations  Wesley Ho, pharmacist able to reach Wesley Ho and speak with him. Refer to pharmacist's note Pending response from Wesley Ho at Primary care provider office but Cm left message for Wesley Ho to return a call to Wesley Ho  Pending Behavioral health evaluation on March 14, 2017 Wesley Ho also discussed the cost of his Androgel is now unaffordable at his pharmacy.  Cm discussed starting 03/05/17 deductibles have to be met. Referred pharmacist to see if there was availability for any medication assistance. He reports the medication was left at his pharmacy at this time   Plans Continue to collaborate with Wesley Ho Pharmacist and Wesley Ho Primary care provider office NP/PA for  to s/s Follow up with attendance to his behavioral health appointment next week   Wesley L. Lavina Hamman, RN, BSN, [place] Care Management [phone]

## 2017-03-06 NOTE — Patient Outreach (Addendum)
[facility]) Care Management  03/06/2017  Wesley Ho October 02, 1955 [id number]  Received a message from [facility] Community [facility], Edd ArbourKimberly Gibbs stating the patient expressed concerns about his medications.  Patient was called. HIPAA identifiers were obtained. Patient reported tremors and nausea which he felt was due to the alprazolam dose being tapered from 2mg  1 tablet four times daily to 1mg  1 tablet four times daily  Fill history:  Alprazolam 2mg  filled 10/17/16  Alprazolam 2mg  filled on 12/10/16 for #120 tablets 1 tablet four times daily-Dr. Sherwood GamblerFusco  Alprazolam 1mg  #120 tablets 1 tablet four times daily ---01/18/17  Alprazolam 1mg  #90 tablets 1 tablet every 6 hours--Kyra McClanahan, PA (Dr. Evette DoffingZac Hall)  Diazepam 5mg  "on hold" 1 tablet three times daily Nelwyn SalisburyAaisha Powell, PA  Patient was advised to call his provider and tell them about his symptoms to see what his options would be.  In addition, Dr. Scharlene GlossHall's office was called on the patient's behalf to describe his symptoms.  Patient has been on high dose benzodiazepine therapy for more than twenty years. As such, a very slow taper may be necessary.  However, the patient has been on Alprazolam 1 mg since November and it would seem he would have experienced the symptoms he complained of when the dose was first decreased.    Plan:  Follow up with the patient in 1-3 business days.  Route note to Dr. Frederich BaldingHall  Deyon Chizek J. Ewa Hipp, PharmD, [facility] LLCBCACP [facility] Clinical Pharmacist [phone]
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
## 2017-03-12 ENCOUNTER — Ambulatory Visit: Payer: Self-pay | Admitting: Pharmacist

## 2017-03-13 ENCOUNTER — Other Ambulatory Visit: Payer: Self-pay | Admitting: Pharmacist

## 2017-03-13 NOTE — Patient Outreach (Signed)
[facility] Care Management  03/13/2017  Wesley Ho [birth year] [id number]  Called patient regarding medication adherence.   HIPAA identifiers were obtained.  Patient confirmed Dr. Scharlene GlossHall's office contacted him after both [facility], Marval RegalKim Gibbs and I reached out to them about the patient's complaint of tremors and anxiousness.    Patient reported and it was confirmed by [facility] that a new prescription was sent for Alprazolam 1mg  1 tablet every 6 hours as needed filled on 03/12/16. Patient had 90 alprazolam 1mg  filled 02/18/17 for a 23 day supply, 01/18/17 for a 30 day supply (#120), 10.08/18 (#120 ).  Patient has a prescription for diazepam 5mg  "on hold" at [facility].  Patient has an appointment with his provider and Edd ArbourKimberly Gibbs will be attending the appointment with the patient.  Plan: Follow up with the patient after his visit. Close the patient's case as he is being closely followed by [facility].  Beecher McardleKatina J. Carmello Cabiness, PharmD, BCACP [facility] Clinical Pharmacist [phone]

## 2017-03-14 ENCOUNTER — Other Ambulatory Visit: Payer: Self-pay | Admitting: *Deleted

## 2017-03-14 ENCOUNTER — Encounter (HOSPITAL_COMMUNITY): Payer: Self-pay | Admitting: Psychiatry

## 2017-03-14 ENCOUNTER — Ambulatory Visit (HOSPITAL_COMMUNITY): Payer: PPO | Admitting: Psychiatry

## 2017-03-14 VITALS — BP 126/85 | HR 65 | Ht 68.0 in | Wt 242.0 lb

## 2017-03-14 DIAGNOSIS — Z87891 Personal history of nicotine dependence: Secondary | ICD-10-CM

## 2017-03-14 DIAGNOSIS — F411 Generalized anxiety disorder: Secondary | ICD-10-CM | POA: Diagnosis not present

## 2017-03-14 DIAGNOSIS — F4010 Social phobia, unspecified: Secondary | ICD-10-CM

## 2017-03-14 DIAGNOSIS — F410 Panic disorder [episodic paroxysmal anxiety] without agoraphobia: Secondary | ICD-10-CM

## 2017-03-14 DIAGNOSIS — Z62810 Personal history of physical and sexual abuse in childhood: Secondary | ICD-10-CM | POA: Diagnosis not present

## 2017-03-14 DIAGNOSIS — Z811 Family history of alcohol abuse and dependence: Secondary | ICD-10-CM | POA: Diagnosis not present

## 2017-03-14 MED ORDER — SERTRALINE HCL 50 MG PO TABS: ORAL_TABLET | ORAL | 1 refills | Status: DC

## 2017-03-14 MED ORDER — LORAZEPAM 2 MG PO TABS: ORAL_TABLET | ORAL | 0 refills | Status: DC

## 2017-03-14 NOTE — Patient Outreach (Signed)
[facility]) Care Management   03/14/2017  Wesley Ho [birth year] [id number]  Wesley Ho is an 62 y.o. male  Subjective:  See notes below  Objective:   BP 126/85   Pulse 65   SpO2 94%   Review of Systems  Constitutional: Positive for malaise/fatigue. Negative for chills, diaphoresis, fever and weight loss.  HENT: Negative for congestion, ear discharge, ear pain, hearing loss, nosebleeds, sinus pain, sore throat and tinnitus.   Eyes: Negative.  Negative for blurred vision, double vision, photophobia, pain, discharge and redness.  Respiratory: Negative.  Negative for cough, hemoptysis, sputum production, shortness of breath, wheezing and stridor.   Cardiovascular: Negative.  Negative for chest pain, palpitations, orthopnea, claudication, leg swelling and PND.  Gastrointestinal: Positive for nausea. Negative for abdominal pain, blood in stool, constipation, diarrhea, heartburn, melena and vomiting.  Genitourinary: Negative.  Negative for dysuria, flank pain, frequency, hematuria and urgency.  Musculoskeletal: Positive for joint pain. Negative for back pain, falls, myalgias and neck pain.  Skin: Positive for rash. Negative for itching.  Neurological: Positive for dizziness, tingling, tremors, sensory change, weakness and headaches. Negative for speech change, focal weakness, seizures and loss of consciousness.       Neuropathy of feet  Endo/Heme/Allergies: Negative for environmental allergies and polydipsia. Bruises/bleeds easily.  Psychiatric/Behavioral: Negative for depression, hallucinations, memory loss, substance abuse and suicidal ideas. The patient is nervous/anxious. The patient does not have insomnia.     Physical Exam  Constitutional: He is oriented to person, place, and time. He appears well-developed and well-nourished.  HENT:  Head: Normocephalic and atraumatic.  Eyes: Conjunctivae are normal. Pupils are equal, round, and reactive to light.  Neck:  Normal range of motion. Neck supple.  Cardiovascular: Normal rate and regular rhythm.  Respiratory: Effort normal and breath sounds normal.  GI: Soft. Bowel sounds are normal.  Musculoskeletal: Normal range of motion.  Neurological: He is alert and oriented to person, place, and time.  Skin: Skin is warm and dry.  Psychiatric: He has a normal mood and affect. His behavior is normal. Judgment and thought content normal.    Encounter Medications:   Outpatient Encounter Medications as of 03/14/2017  Medication Sig Note  . acetaminophen (TYLENOL) 500 MG tablet Take 500-1,000 mg by mouth every 6 (six) hours as needed for mild pain.   Marland Kitchen ALPRAZolam (XANAX) 1 MG tablet Take 1 mg by mouth at bedtime as needed for anxiety (every 6 hours as needed ordered).   . ANDROGEL PUMP 20.25 MG/ACT (1.62%) GEL Use 1 pump in the morning   . aspirin EC 81 MG tablet Take 81 mg by mouth daily.   Marland Kitchen aspirin-sod bicarb-citric acid (ALKA-SELTZER) 325 MG TBEF tablet Take 325 mg by mouth every 6 (six) hours as needed.   . busPIRone (BUSPAR) 10 MG tablet Take 10 mg by mouth 3 (three) times daily.    . Calcium Carb-Cholecalciferol (CALCIUM 1000 + D PO) Take 1 tablet by mouth daily.   . carboxymethylcellulose (REFRESH TEARS) 0.5 % SOLN 1 drop 2 (two) times daily as needed.    . cholecalciferol (VITAMIN D) 1000 units tablet Take 1,000 Units by mouth daily.   . clobetasol cream (TEMOVATE) 0.05 % Apply to the affected area as directed   . Cyanocobalamin 2500 MCG TABS Take 1 tablet by mouth daily.    . diazepam (VALIUM) 5 MG tablet Take 5 mg by mouth 3 (three) times daily. 01/29/2017: New prescription written.  Patient has not picked it up  yet--replaces alprazolam  . ferrous sulfate (IRON SUPPLEMENT) 325 (65 FE) MG tablet Take 65 mg by mouth daily with breakfast.   . furosemide (LASIX) 20 MG tablet Take 1 tablet by mouth daily.   . hydrochlorothiazide (HYDRODIURIL) 25 MG tablet Take 25 mg by mouth daily.   Marland Kitchen  HYDROcodone-acetaminophen (NORCO) 7.5-325 MG tablet One every four hours for pain as needed.  Do not drive car or operate machinery while taking this medicine.  Must last 14 days.   . Multiple Vitamins-Minerals (HM COMPLETE 50+ MENS ULTIMATE PO) Take 1 tablet by mouth daily.   . Papaverine-Alprostadil 30-10 MG-MCG/ML SOLN by Intracavernosal route.   . Potassium 99 MG TABS Take 99 mg by mouth daily.   . pregabalin (LYRICA) 200 MG capsule Take 200 mg by mouth 3 (three) times daily.   Marland Kitchen Specialty Vitamins Products (MM BIOTIN/KERATIN PO) Take 1 capsule by mouth daily.   . tamoxifen (NOLVADEX) 20 MG tablet Take 20 mg by mouth daily.   . tamsulosin (FLOMAX) 0.4 MG CAPS capsule Take 1 capsule by mouth daily.   . WHEY PROTEIN PO Take by mouth 2 (two) times daily. 12/13/2016: Only takes when going to workout   No facility-administered encounter medications on file as of 03/14/2017.     Functional Status:   In your present state of health, do you have any difficulty performing the following activities: 01/18/2017 11/21/2016  Hearing? N N  Vision? N N  Difficulty concentrating or making decisions? N N  Walking or climbing stairs? N N  Dressing or bathing? N N  Doing errands, shopping? N -  Some recent data might be hidden    Fall/Depression Screening:    Fall Risk  01/18/2017 12/24/2016 11/27/2016  Falls in the past year? Yes Yes Yes  Number falls in past yr: 2 or more 2 or more 2 or more  Injury with Fall? Yes Yes No  Risk Factor Category  High Fall Risk High Fall Risk -  Risk for fall due to : History of fall(s) History of fall(s);Impaired balance/gait;Impaired mobility;Medication side effect;Mental status change Medication side effect  Follow up Falls prevention discussed Education provided;Falls prevention discussed -   PHQ 2/9 Scores 01/18/2017 12/24/2016 11/27/2016 11/14/2016 07/02/2016 04/02/2016 02/23/2016  PHQ - 2 Score 1 2 0 0 0 0 0  PHQ- 9 Score - 3 - - - - -    Assessment:    Met Wesley Ho at his first [place] appointment to provide support.  He arrived early via [facility]. Sat with in the waiting room Dr Modesta Messing  hypotension - Discussed the differences in hypertension and hypotension  Fall Risk - discussed fall risk criteria  Neuropathy Sleeping better Medicines Reports still some tremors (noted slight shaking of hands with anxious during office visit that would resolve) "queasy" (nausea) Dr Modesta Messing wants him to stop taking xanax as of 03/14/17, fill ativan 2 mg tid prn Rx and start taking it on 03/14/17, continue Buspar as prescribed, will start introducing antidepressant, complete a medical release form for medical records, schedule a month follow up psychiatry visit and schedule an initial psychology/therapy visit with Merrily Pew Sheets as soon as available CM recommended he keep a journal to monitor his symptoms as he change from xanax to ativan and as he starts the antidepressant. Cm assisted with getting him the office's crisis number to be able to report abnormal changes related to new medications and behavior He was given the contact number [phone] and hotline  crisis numbers He was also encouraged to contact first the behavioral health office then [facility] pharmacist if questions during this time.  Plan:  Cm will follow up with Wesley Ho in 4-5 weeks if no call from him Route note to care team members   Logan L. Lavina Hamman, RN, BSN, [place] Care Management [phone]

## 2017-03-14 NOTE — Patient Instructions (Addendum)
1. Start sertraline 25 mg daily for one week, then 50 mg daily  2. Continue buspar 10 mg three times a day  3. Start ativan 1-2 mg three times a day as needed for anxiety 4. Discontinue Xanax 5. Referral to therapy 6. Return to clinic in one month for 30 mins 7. Obtain record from primary care

## 2017-03-18 ENCOUNTER — Other Ambulatory Visit: Payer: Self-pay | Admitting: Pharmacist

## 2017-03-18 ENCOUNTER — Other Ambulatory Visit: Payer: Self-pay | Admitting: *Deleted

## 2017-03-18 DIAGNOSIS — M79675 Pain in left toe(s): Secondary | ICD-10-CM | POA: Diagnosis not present

## 2017-03-18 DIAGNOSIS — G629 Polyneuropathy, unspecified: Secondary | ICD-10-CM | POA: Diagnosis not present

## 2017-03-18 DIAGNOSIS — B351 Tinea unguium: Secondary | ICD-10-CM | POA: Diagnosis not present

## 2017-03-18 DIAGNOSIS — M79674 Pain in right toe(s): Secondary | ICD-10-CM | POA: Diagnosis not present
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
## 2017-03-19 NOTE — Patient Outreach (Signed)
[facility]) Care Management  03/19/2017  Chuck Hintdward W Kaminski 1955-11-28 [id number]   CSW made second attempt to reach patient with no answer. CSW left HIPPA compliant voicemail on patient's phone (#: [phone]) and will await returned call. CSW will also have unsuccessful outreach letter mailed to patient as well & try back within 1 week.    Lincoln MaxinKelly Doren Kaspar, LCSW [facility]  Clinical Social Worker cell #: [phone]

## 2017-03-20 ENCOUNTER — Encounter: Payer: Self-pay | Admitting: Orthopaedic Surgery

## 2017-03-20 ENCOUNTER — Ambulatory Visit: Payer: PPO | Admitting: Orthopaedic Surgery

## 2017-03-20 VITALS — BP 160/106 | HR 73 | Ht 68.0 in | Wt 240.0 lb

## 2017-03-20 DIAGNOSIS — M25512 Pain in left shoulder: Secondary | ICD-10-CM

## 2017-03-20 DIAGNOSIS — G8929 Other chronic pain: Secondary | ICD-10-CM

## 2017-03-20 MED ORDER — HYDROCODONE-ACETAMINOPHEN 7.5-325 MG PO TABS: ORAL_TABLET | ORAL | 0 refills | Status: DC

## 2017-03-20 NOTE — Progress Notes (Signed)
Patient ZO:XWRUEA TAYSHUN Ho, male DOB:[birth year], 62 y.o. VWU:[id number]  Chief Complaint  Patient presents with  . Shoulder Pain    left     HPI  Wesley Ho is a 62 y.o. male who has chronic left shoulder pain.  He has good and bad days.  The cold weather makes it worse. He has been going to the gym and doing exercises. He has no new trauma, no paresthesias.  He is taking his medicine. HPI  Body mass index is 36.49 kg/m.  ROS  Review of Systems  HENT: Negative for congestion.   Respiratory: Positive for cough and shortness of breath. Negative for choking.   Cardiovascular: Negative for chest pain and leg swelling.  Endocrine: Positive for cold intolerance.  Musculoskeletal: Positive for arthralgias.  Allergic/Immunologic: Positive for environmental allergies.  Psychiatric/Behavioral: The patient is nervous/anxious.   All other systems reviewed and are negative.   Past Medical History:  Diagnosis Date  . Anxiety   . Arthritis   . Bronchitis   . Chronic diarrhea   . Chronic edema of lower extremity 09/28/2010  . COPD (chronic obstructive pulmonary disease) (HCC)   . DVT (deep venous thrombosis) (HCC) 09/28/2010  . Emphysema of lung (HCC)   . Fatigue   . H/O Thrombocytopenia 09/28/2010  . Left radial fracture   . Multiple lung nodules 09/28/2010  . Neuropathy     Past Surgical History:  Procedure Laterality Date  . CATARACT EXTRACTION W/PHACO Right 01/17/2016   Procedure: CATARACT EXTRACTION PHACO AND INTRAOCULAR LENS PLACEMENT (IOC);  Surgeon: Jethro Bolus, MD;  Location: [facility];  Service: Ophthalmology;  Laterality: Right;  CDE: 4.34  . CATARACT EXTRACTION W/PHACO Left 01/31/2016   Procedure: CATARACT EXTRACTION PHACO AND INTRAOCULAR LENS PLACEMENT (IOC);  Surgeon: Jethro Bolus, MD;  Location: [facility];  Service: Ophthalmology;  Laterality: Left;  CDE: 5.69  . CHOLECYSTECTOMY    . MOUTH SURGERY      Family History  Problem Relation Age of Onset  . Diabetes Mother    . Clotting disorder Father   . Alcohol abuse Maternal Uncle     Social History Social History   Tobacco Use  . Smoking status: Former Smoker    Packs/day: 1.00    Years: 40.00    Pack years: 40.00    Last attempt to quit: 01/11/2013    Years since quitting: 4.1  . Smokeless tobacco: Never Used  Substance Use Topics  . Alcohol use: No    Comment: stopped drinking 2016.  . Drug use: No    No Known Allergies  Current Outpatient Medications  Medication Sig Dispense Refill  . acetaminophen (TYLENOL) 500 MG tablet Take 500-1,000 mg by mouth every 6 (six) hours as needed for mild pain.    . ANDROGEL PUMP 20.25 MG/ACT (1.62%) GEL Use 1 pump in the morning 75 g 0  . aspirin EC 81 MG tablet Take 81 mg by mouth daily.    Marland Kitchen aspirin-sod bicarb-citric acid (ALKA-SELTZER) 325 MG TBEF tablet Take 325 mg by mouth every 6 (six) hours as needed.    . busPIRone (BUSPAR) 10 MG tablet Take 10 mg by mouth 3 (three) times daily.     . Calcium Carb-Cholecalciferol (CALCIUM 1000 + D PO) Take 1 tablet by mouth daily.    . carboxymethylcellulose (REFRESH TEARS) 0.5 % SOLN 1 drop 2 (two) times daily as needed.     . cholecalciferol (VITAMIN D) 1000 units tablet Take 1,000 Units by mouth daily.    Marland Kitchen  clobetasol cream (TEMOVATE) 0.05 % Apply to the affected area as directed    . Cyanocobalamin 2500 MCG TABS Take 1 tablet by mouth daily.     . ferrous sulfate (IRON SUPPLEMENT) 325 (65 FE) MG tablet Take 65 mg by mouth daily with breakfast.    . furosemide (LASIX) 20 MG tablet Take 1 tablet by mouth daily.    . hydrochlorothiazide (HYDRODIURIL) 25 MG tablet Take 25 mg by mouth daily.    Marland Kitchen. HYDROcodone-acetaminophen (NORCO) 7.5-325 MG tablet One every four hours for pain as needed.  Do not drive car or operate machinery while taking this medicine.  Must last 14 days. 56 tablet 0  . LORazepam (ATIVAN) 2 MG tablet 1-2 mg three times a day as needed for anxiety 90 tablet 0  . Multiple Vitamins-Minerals (HM  COMPLETE 50+ MENS ULTIMATE PO) Take 1 tablet by mouth daily.    . Papaverine-Alprostadil 30-10 MG-MCG/ML SOLN by Intracavernosal route.    . Potassium 99 MG TABS Take 99 mg by mouth daily.    . pregabalin (LYRICA) 200 MG capsule Take 200 mg by mouth 3 (three) times daily.    . sertraline (ZOLOFT) 50 MG tablet Start 25 mg daily for one week, then 50 mg daily 30 tablet 1  . Specialty Vitamins Products (MM BIOTIN/KERATIN PO) Take 1 capsule by mouth daily.    . tamoxifen (NOLVADEX) 20 MG tablet Take 20 mg by mouth daily.    . tamsulosin (FLOMAX) 0.4 MG CAPS capsule Take 1 capsule by mouth daily.    . WHEY PROTEIN PO Take by mouth daily as needed.      No current facility-administered medications for this visit.      Physical Exam  Blood pressure (!) 160/106, pulse 73, height 5\' 8"  (1.727 m), weight 240 lb (108.9 kg).  Constitutional: overall normal hygiene, normal nutrition, well developed, normal grooming, normal body habitus. Assistive device:cane  Musculoskeletal: gait and station Limp left, muscle tone and strength are normal, no tremors or atrophy is present.  .  Neurological: coordination overall normal.  Deep tendon reflex/nerve stretch intact.  Sensation normal.  Cranial nerves II-XII intact.   Skin:   Normal overall no scars, lesions, ulcers or rashes. No psoriasis.  Psychiatric: Alert and oriented x 3.  Recent memory intact, remote memory unclear.  Normal mood and affect. Well groomed.  Good eye contact.  Cardiovascular: overall no swelling, no varicosities, no edema bilaterally, normal temperatures of the legs and arms, no clubbing, cyanosis and good capillary refill.  Lymphatic: palpation is normal.  Examination of left Upper Extremity is done.  Inspection:   Overall:  Elbow non-tender without crepitus or defects, forearm non-tender without crepitus or defects, wrist non-tender without crepitus or defects, hand non-tender.    Shoulder: with glenohumeral joint tenderness,  without effusion.   Upper arm: without swelling and tenderness   Range of motion:   Overall:  Full range of motion of the elbow, full range of motion of wrist and full range of motion in fingers.   Shoulder:  left  165 degrees forward flexion; 150 degrees abduction; 35 degrees internal rotation, 35 degrees external rotation, 15 degrees extension, 40 degrees adduction.   Stability:   Overall:  Shoulder, elbow and wrist stable   Strength and Tone:   Overall full shoulder muscles strength, full upper arm strength and normal upper arm bulk and tone.  All other systems reviewed and are negative   The patient has been educated about the nature of  the problem(s) and counseled on treatment options.  The patient appeared to understand what I have discussed and is in agreement with it.  Encounter Diagnosis  Name Primary?  . Chronic left shoulder pain Yes    PLAN Call if any problems.  Precautions discussed.  Continue current medications.   Return to clinic 1 month   I have reviewed the [facility] Controlled Substance Reporting System web site prior to prescribing narcotic medicine for this patient.  Electronically Signed Darreld Mclean, MD 1/16/20191:43 PM

## 2017-03-22 ENCOUNTER — Other Ambulatory Visit (HOSPITAL_COMMUNITY): Payer: Self-pay | Admitting: Psychiatry

## 2017-03-22 ENCOUNTER — Telehealth (HOSPITAL_COMMUNITY): Payer: Self-pay | Admitting: Psychiatry

## 2017-03-22 ENCOUNTER — Other Ambulatory Visit: Payer: Self-pay | Admitting: *Deleted

## 2017-03-22 ENCOUNTER — Telehealth (HOSPITAL_COMMUNITY): Payer: Self-pay | Admitting: *Deleted

## 2017-03-22 DIAGNOSIS — N400 Enlarged prostate without lower urinary tract symptoms: Secondary | ICD-10-CM | POA: Diagnosis not present

## 2017-03-22 DIAGNOSIS — F419 Anxiety disorder, unspecified: Secondary | ICD-10-CM | POA: Diagnosis not present

## 2017-03-22 DIAGNOSIS — N62 Hypertrophy of breast: Secondary | ICD-10-CM | POA: Diagnosis not present

## 2017-03-22 DIAGNOSIS — Z125 Encounter for screening for malignant neoplasm of prostate: Secondary | ICD-10-CM | POA: Diagnosis not present

## 2017-03-22 DIAGNOSIS — Z1159 Encounter for screening for other viral diseases: Secondary | ICD-10-CM | POA: Diagnosis not present

## 2017-03-22 MED ORDER — FLUOXETINE HCL 10 MG PO TABS: ORAL_TABLET | ORAL | 0 refills | Status: DC
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
## 2017-03-22 NOTE — Telephone Encounter (Signed)
Dr Charm RingsHisada Kimberley the case worker for [facility] called with [name] begin on line with concerns about his medication change. Since starting the Ativan verses the Xanax he's been experiencing: Sleeplessness, Tremors, Crazy Dreams, Quizzyness, Awakening in Terror. The [facility] case worker [place]Kimberley had [name] [facility] & they stated this sound like symptoms of withdrawal. He's asking for help & stated that he was better on the Xanax.

## 2017-03-22 NOTE — Patient Outreach (Signed)
[facility]) Care Management  03/22/2017  Chuck Hintdward W Whitworth [birth year] [id number]   Late entry for 03/21/17   Coordination of care  Collaboration with [facility] pharmacist, [name] related to plan of care for [name]  Plan Follow up with [name] in 3-4 weeks or prn if he calls Cm   Cala BradfordKimberly L. Noelle PennerGibbs, RN, BSN, CCM [facility] Care [facility] [phone]

## 2017-03-22 NOTE — Telephone Encounter (Signed)
[facility] case worker has scheduled sooner appointment 03/28/17

## 2017-03-22 NOTE — Telephone Encounter (Signed)
Discussed with Mr. Wesley Ho and Ms. Wesley Ho, [facility] case[facility] on the phone.   Patient reports worsening queasiness and hand tremors. He notices more in the morning after taking sertraline 25 mg. He has been taking ativan 2 mg TID and denies taking Xanax. He states that he was doing better when he was on Xanax 2 QID. He is concerned that he might have hand tremors at the meeting tomorrow, although he needs to sign the form.   Will switch from sertraline to fluoxetine given he might be experiencing side effect from this medication. It is also possible that upcoming meeting tomorrow is contributing to his anxiety like symptoms. On phone, he demonstrates linear thought process and no concern of confusion. Will stay on current dose of ativan; he is reminded of available resources such as ED, urgent care if any worsening in his symptoms. Will have earlier appointment for evaluation.   - Discontinue sertraline - Start fluoxetine 10 mg daily for one week, then 20 mg daily  - Continue ativan 2 mg TID prn for anxiety

## 2017-03-22 NOTE — Telephone Encounter (Signed)
Left voice message to discuss with patient. In case we are unable to contact with the patient, please contact them to consider earlier follow up appointment to discuss medication. Also make them aware of available resources (such as ED, urgent care) on weekends.   Per chart, he was seen by a provider on 1/16. No tachycardia, though hypertensive. No significant signs of withdrawal is documented (except anxiety).
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
## 2017-03-26 ENCOUNTER — Other Ambulatory Visit: Payer: Self-pay | Admitting: *Deleted

## 2017-03-27 ENCOUNTER — Encounter (HOSPITAL_COMMUNITY): Payer: Self-pay | Admitting: Licensed Clinical Social Worker

## 2017-03-27 ENCOUNTER — Ambulatory Visit (INDEPENDENT_AMBULATORY_CARE_PROVIDER_SITE_OTHER): Payer: PPO | Admitting: Licensed Clinical Social Worker

## 2017-03-27 ENCOUNTER — Other Ambulatory Visit: Payer: Self-pay | Admitting: "Endocrinology

## 2017-03-27 DIAGNOSIS — F411 Generalized anxiety disorder: Secondary | ICD-10-CM | POA: Diagnosis not present

## 2017-03-27 DIAGNOSIS — E291 Testicular hypofunction: Secondary | ICD-10-CM | POA: Diagnosis not present
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
## 2017-03-28 ENCOUNTER — Ambulatory Visit (HOSPITAL_COMMUNITY): Payer: Self-pay | Admitting: Psychiatry

## 2017-03-28 NOTE — Patient Outreach (Signed)
[facility] Care Management  03/28/2017  Chuck Hintdward W Purdum 1955/11/19 [id number]   CSW made third & final attempt to reach patient with no answer. CSW left HIPPA compliant voicemail on patient's phone (#: [phone]) and will await returned call. If CSW does not receive a return call from patient within the next 10 business days, CSW will proceed with case closure. Required number of phone attempts will have been made and outreach letter mailed.    Lincoln MaxinKelly Suzanna Zahn, LCSW [facility]  Clinical Social Worker cell #: [phone]

## 2017-04-01 ENCOUNTER — Other Ambulatory Visit: Payer: Self-pay | Admitting: Pharmacist

## 2017-04-01 ENCOUNTER — Telehealth: Payer: Self-pay | Admitting: *Deleted

## 2017-04-01 NOTE — Patient Outreach (Signed)
[facility]) Care Management  04/01/2017  Wesley Ho Mar 15, 1955 [id number]   Patient was called to follow up on symptoms of anxiety. HIPAA identifiers were obtained. Patient confirmed he is doing better after being switched from sertraline to fluoxetine. However, he said he is still experiencing tremors and elevated anxiety.  Patient was comforted and educated on benzodiazepine dosing.  Patient wondered if his Psych provider would consider increasing his dose.  Patient was encouraged to trust the process and to reach out to his provider because the purpose is to decrease and possibly eliminate benzodiazepine use.  Plan: Call patient back in 7-10 business days Close pharmacy case.  Beecher McardleKatina J. Izzak Fries, PharmD, BCACP [facility] Clinical Pharmacist [phone]

## 2017-04-01 NOTE — Patient Outreach (Signed)
[facility]) Care Management  04/01/2017  Wesley Ho 09-20-1955 [id number]   Care coordination   Community CM consulted by [facility] pharmacist, Abbey Chatters, related to plan of care for Wesley Ho.  Wesley Ho is still wondering if he could Dr Modesta Messing would increase his medicine. Please refer also to Abbey Chatters note in Epic for 04/01/17 Cm updated Pharmacist on plan of care for Wesley Ho to see his therapist and Dr Modesta Messing on last week for follow care stemming from 03/22/17 interaction with Wesley Ho. Pharmacist confirmed Wesley Ho reported he cancelled and rescheduled his urgent appointment with Dr Modesta Messing that was scheduled with the assistance of this CM for 03/28/17 at 11 am to review his medication treatment plan for anxiety management after he reported on 03/22/17 concerns with side effects of Zoloft.   He did attend his 03/27/17 therapist appointment with J Sheets without issues. Refer to the notes from [employer] in [place].   Plans: Community CM will make an outreach call to Wesley Ho in 3-7 days to follow up on his progress.   He has been assisted to establish care with a new Primary care provider, Dr Nevada Crane, new psychiatrist, Dr Modesta Messing and new psychologist/therapist Glori Bickers.  He is not needing a pain management provider but continues to coordinate his own office visits to Dr Luna Glasgow for shoulder pain and Dr Merlene Laughter office per report of Wesley Camerer to Cm.  He is on a medication treatment plan for anxiety that is being monitored by Dr Modesta Messing.   Wesley Hellenbrand has been encouraged to contact behavorial health office first, then crisis hotline second, and [facility] 24 hour nurse line, [facility] CM or [facility] pharmacist prn. He has also been informed by Dr Modesta Messing and reminded by Cm of the available resources such as ED or urgent car if any worsening in his symptoms occur.  Wesley Willinger has not had any falls reported since CM has been engaged with him. Wesley Tesar has meet [facility] goals and will be evaluated for case closure Route this  note to [place] team members listed in [place] Problem One     Most [place] Problem One  need new pcp, pain management, psychology and psychiatrist medical providers  Role Documenting the Problem One  Care Management [place] for Problem One  Active  [facility] Long Term Goal   over the next 45 days Patient will have new pcp, pain management MD, psychiatrist, psychologist  [facility] Long Term Goal Start Date  12/24/16  [facility] Long Term Goal Met Date  03/27/17  Interventions for Problem One Long Term Goal  provide list of in network HTA providers, coordinate and collaborate with providers for referrals, assist with appointments  [facility] CM Short Term Goal #1   over the next 14 days Patient will choose in network HTA providers to seek new patient services   [facility] CM Short Term Goal #1 Start Date  12/24/16  [facility] CM Short Term Goal #1 Met Date  01/07/17  Interventions for Short Term Goal #1  provide list of in network HTA providers, coordinate and collaborate with providers for referrals, assist with appointment    [facility] CM Care Plan Problem Two     Most Recent Value  Care Plan Problem Two  fall risk prevention  Role Documenting the Problem Two  Care Management [place] for Problem Two  Active  Interventions for Problem Two  Long Term Goal   assess fall hx and causes, have pharmacy to review medicine list for causes of falls, medicine management, gait assessment, monitor for orthostatic vital signs, home safety eval, check foot wear, check home for clutter and needed home modifications, check fo need of PT/OT services   [facility] Long Term Goal  over the next 45 days Patient will be able to implement safety measures to decrease fall risks  [facility] Long Term Goal Start Date  12/24/16  [facility] Long Term Goal Met Date  03/14/17  [facility] CM Short Term Goal #1   over the next 30 days patient will recognize risk factors for falls, keep a journal of falls/causes and implement  measures to decxrease falls   [facility] CM Short Term Goal #1 Start Date  12/24/16  [facility] CM Short Term Goal #1 Met Date   02/15/17  Interventions for Short Term Goal #2   [facility] CM will assess for fall reasons, educate on fall reasons/prevention safety measures, check fall journal/discuss fall causes plus medicine managment          Cadance Raus L. Lavina Hamman, RN, BSN, [place] Care Management [phone]

## 2017-04-02 ENCOUNTER — Telehealth: Payer: Self-pay | Admitting: *Deleted

## 2017-04-02 ENCOUNTER — Ambulatory Visit: Payer: PPO | Admitting: "Endocrinology

## 2017-04-02 NOTE — Patient Outreach (Addendum)
[facility]) Care Management  04/02/2017  Wesley Ho 25-Sep-1955 [id number]  Care coordination  CM received a message from [facility] , T Jeannetta Naplkins that was sent from [employer] 24 hr nurse line RN-G Phebe CollaVandenberg  Cm called and spoke with Wesley Ho about the [employer] ([facility]) medical call center call made on 04/02/17 0657:42 am---Chief complain Caller states he took a two fluid pills at 230, now he is sweating, has slurred speech, and feels confused.  WANTING TO CANCEL HIS APPT. Has taken self off his diuretics for awhile.   He reports "I just called them because of what physically happened to me in a couple of hours" . Cm confirmed this action of taking these after he had not been taking them as ordered may have caused an increase in sweating and voiding as this is the purpose of diuretics.   Medication intake confirmed as not related to behavioral issues (suicidal thoughts) nor "I was not confused I took them as ordered." Took 2 fluid pills, took 1 lasix that is order as 20 mg daily and one hydrodiuril that is ordered as one 25 mg daily.  "I have done it for so long. This is the first time I have had this type of reaction. I have taken them both together before."  Wesley Ho allowed to ventilate his feelings and shared the following in the time he spoke with CM  "I want to fix my feet and to find out why I am prone to fall" "If I get up I feel unstable" "I don't like it.  I feel caged"  "I'm just mad as hell.  I don't have a car." "I am not use to not getting results" [facility] "I have only seen Dr Gerilyn Pilgrimoonquah once but see Rolm Bookbinderyeshia the rest of the time" and "given a prescription miracle pill" He states he would like to see Dr Gerilyn Pilgrimoonquah to discuss his issues   "I can't focus well. My speech is slurred" During this interaction slurred speech is not heard by CM.  CM did note he was going from subject to subject in conversation and had to stop to  redirect himself back to the main subject he was discussing. CM did not have to redirect him but did have to ask for clarification at intervals.  "On the right side of my t-shirt and pants there was something wet when I woke up."  He confirmed it was not urine but sweat but not enough to change his clothes "I woke up on my couch" "I am so not steady today and I really don't want to do anything this afternoon" I was suppose to go see Dr Fransico HimNida this morning but I decided I was not going"  "My son is next door watching out for me today" I'm better but do not feel like I am up to speed" When Cm clarified what he was concerned about with Dr Margo AyeHall he replied "Make it go away.", "the fluid I can feel on my feet. When I walk on my feet, it is painful"   He confirmed he has an appointment with Dr Margo AyeHall this week on April 04 2017 10 am  I am now drinking propel to replace loss from sweating"  When CM inquired about socialization and getting out of the home with his ex wife and son, he stated his son takes him out but he does not feel that he has the time to go out and  do anything for himself after taking care of things for his deceased father and ill mother (he is her POA). He reports having difficulty with cleaning his house and "keep myself clean"  He reports he has to "pay my ex wife to take me places" and he goes "out about every day" He confirms he has a follow up appointment with Ivin Booty, therapist/psychologist on 04/18/17 and will speak with him about anger on next visit plus shared frustration with not having a car "I am tired of not getting what I want.  I like Ivin Booty but I don't feel doctor Hisada and doctor Margo Aye are helping me" "I am not worried abut drug dependency I think it Is productive to have someone to speak to"  Wesley Ho reports feelings of losing control of being "the leader in my family"  "I hate that she does not need me. I got so use to being the leader of the pack. I have trouble backing  down."  "the agony of my children not needing me causes me to be anxious" "with talking with you I'm not as mad" By the end of the conversation Wesley Ho was more jovial "my stubbornness and refusal to accept what I don't want to accept. It is a battle of the Titans"  Plans Wesley Ho denies being medically unstable and states he preferred not to seek higher level of care today  Allowed him lots of time to ventilate his feelings (he went from various subjects to various subjects) Time spent 1 hour 40 minutes He voiced appreciation for being allowed to ventilate his feelings Discussed the importance of continuing his plan of care with Dr. Margo Aye, Dr Vanetta Shawl, Ivin Booty and other providers.   Discussed compliance and progression through his goals of care Discussed case closure may be needed soon. Discussed his resources CM has provided that he can use prn  Discussed using anger in a positive way Encouraged and Advised him to call crisis line prn and to ask Ivin Booty for a possible local support group to continue sharing and ventilating of his feelings that helps him release anger and process threw his concerns Discussed how to address his providers in a positive manner to get effective and safe results Encouraged him to get closure with his father "dis inheriting me" and estranged daughter, becca, when he is ready and to discuss these concerns with Ivin Booty Encouraged to continue to ventilate his feelings of anger with Ivin Booty or in a journal (recommended this previously but he has not started) Encouraged him to encourage himself Discussed elevation of feet for edema management again Discussed because he had been off of lasix and hydrodiuril) in a good while it may be better to take them at separate times to prevent the same results he had on today Route note to Primary care provider, psychiatrist, psychologist, neurologist, orthopedic providers  To updated [facility] Spoke with [facility] Pharmacist, Regino Schultze  L. Noelle Penner, RN, BSN, CCM [facility] Care Management Care Coordinator [phone] week day mobile

## 2017-04-02 NOTE — Telephone Encounter (Deleted)
-----   Message from Marlow Baarseresa L Elkins sent at 04/02/2017 11:42 AM EST ----- Regarding: Nurse Call Center Report Good morning Regino SchultzeKimberly & Katina,  I have indexed a Nurse Call Center Report in EPIC under the Media Tab that was received today for Wesley BoydenEdward Ho.  I have indexed it into Epic under the Media Tab.      Thank you, Rosey Batheresa

## 2017-04-03 LAB — TESTOS,TOTAL,FREE AND SHBG (FEMALE)
FREE TESTOSTERONE: 26.1 pg/mL — AB (ref 35.0–155.0)
SEX HORMONE BINDING: 86 nmol/L — AB (ref 22–77)
Testosterone, Total, LC-MS-MS: 522 ng/dL (ref 250–1100)

## 2017-04-04 ENCOUNTER — Telehealth: Payer: Self-pay | Admitting: *Deleted

## 2017-04-04 DIAGNOSIS — I10 Essential (primary) hypertension: Secondary | ICD-10-CM | POA: Diagnosis not present

## 2017-04-04 DIAGNOSIS — N400 Enlarged prostate without lower urinary tract symptoms: Secondary | ICD-10-CM | POA: Diagnosis not present

## 2017-04-04 DIAGNOSIS — N62 Hypertrophy of breast: Secondary | ICD-10-CM | POA: Diagnosis not present

## 2017-04-04 DIAGNOSIS — F419 Anxiety disorder, unspecified: Secondary | ICD-10-CM | POA: Diagnosis not present

## 2017-04-04 DIAGNOSIS — E782 Mixed hyperlipidemia: Secondary | ICD-10-CM | POA: Diagnosis not present

## 2017-04-04 NOTE — Patient Outreach (Signed)
[facility]) Care Management  04/04/2017  Chuck Hintdward W Gallardo [birth year] [id number]   Care coordination  In basket message to J Sheets for possible resources for [name] related to ventilation of emotions  Plan sent in basket message and will continue to collaborate with behavioral health staff, [facility] staff and MDs Update [name] prn  on next interaction  Britanie Harshman L. Noelle PennerGibbs, RN, BSN, CCM [facility] Care Management Care Coordinator [phone] week day mobile

## 2017-04-05 ENCOUNTER — Other Ambulatory Visit: Payer: Self-pay | Admitting: *Deleted

## 2017-04-05 NOTE — Patient Outreach (Signed)
[facility] ([facility]) Care Management  04/05/2017  Jadiel W Wendland 08/25/1955 [id number]   CSW will perform a case closure on patient, CSW has made 3 unsuccessful attempts to reach patient and mailed letter with no response. All goals of treatment have been met from social work standpoint and no additional social work needs have been identified at this time. CSW will notify patient's RNCM, Kim & Pharmacist, Katina with [facility] of CSW's plans to close patient's case.   CSW will fax an update to patient's Primary Care Physician, Dr. Zack Hall to ensure that they are aware of CSW's involvement with patient's plan of care.    Kelly Harrison, LCSW [facility]  Clinical Social Worker cell #: [phone]  

## 2017-04-10 ENCOUNTER — Telehealth (HOSPITAL_COMMUNITY): Payer: Self-pay | Admitting: *Deleted

## 2017-04-10 NOTE — Telephone Encounter (Signed)
Dr Vanetta ShawlHisada Patient called stating that he's been taking the Prozac for 2 weeks now. And he's concerned states that he feels spaced out, scattered brain, making more aggressive & his been told that looking @ his eyes he looks like someone who is high. He would like to know what you suggest # [phone]

## 2017-04-10 NOTE — Telephone Encounter (Signed)
Advise him to discontinue fluoxetine if it is causing side effect. This medication usually does not cause any withdrawal symptoms, fyi. Given his appointment is coming soon, would discuss other options at the next visit (and also to take some time off from medication to see if his symptoms alleviates).

## 2017-04-15 ENCOUNTER — Other Ambulatory Visit: Payer: Self-pay | Admitting: *Deleted

## 2017-04-15 ENCOUNTER — Other Ambulatory Visit: Payer: Self-pay | Admitting: Pharmacist

## 2017-04-15 NOTE — Patient Outreach (Signed)
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 Patient is currently taking a tapered dose of lorazepam.  HIPAA identifiers were obtained. Patient said his withdrawal symptoms have gotten better. He also reported that he has not taken any alprazolam in over a week. Patient is not supposed to be on alprazolam but reported he had some left over.     Patient also said fluoxetine was discontinued due to side effects "I was stoned" when I took fluoxetine.    He has an appointment with Dr. Vanetta ShawlHisada tomorrow.   [facility].  Medications Reviewed Today    Reviewed by Beecher McardleBoyd, Denaja Verhoeven J, [facility] (Pharmacist) on 04/15/17 at [phone]  Med List Status: <None>  Medication Order Taking? Sig Documenting Provider Last Dose Status Informant  acetaminophen (TYLENOL) 500 MG tablet [id number] Yes Take 500-1,000 mg by mouth every 6 (six) hours as needed for mild pain. [provider] Taking Active Self  ANDROGEL PUMP 20.25 MG/ACT (1.62%) GEL [id number] Yes Use 1 pump in the morning Roma KayserNida, Gebreselassie W, MD Taking Active   aspirin EC 81 MG tablet [id number] Yes Take 81 mg by mouth daily. [provider] Taking Active Self  aspirin-sod bicarb-citric acid (ALKA-SELTZER) 325 MG TBEF tablet [id number] Yes Take 325 mg by mouth every 6 (six) hours as needed. [provider] Taking Active Self  busPIRone (BUSPAR) 10 MG tablet [id number] Yes Take 10 mg by mouth 3 (three) times daily.  [provider] Taking Active Self           Med Note Primus Bravo(Adyson Vanburen J   Thu Dec 13, 2016 12:52 PM)    Calcium Carb-Cholecalciferol (CALCIUM 1000 + D PO) [id number] Yes Take 1 tablet by mouth daily. [provider] Taking Active   carboxymethylcellulose (REFRESH TEARS) 0.5 % SOLN [id number] Yes 1 drop 2 (two) times daily as needed.  [provider]  Taking Active Self  cholecalciferol (VITAMIN D) 1000 units tablet [id number] Yes Take 1,000 Units by mouth daily. [provider] Taking Active Self  clobetasol cream (TEMOVATE) 0.05 % [id number] Yes Apply to the affected area as directed [provider] Taking Active   Cyanocobalamin 2500 MCG TABS [id number] Yes Take 1 tablet by mouth daily.  [provider] Taking Active   ferrous sulfate (IRON SUPPLEMENT) 325 (65 FE) MG tablet [id number] Yes Take 65 mg by mouth daily with breakfast. [provider] Taking Active Self       Patient not taking:       Discontinued 04/15/17 0936 (Side effect (s))   furosemide (LASIX) 20 MG tablet [id number] Yes Take 1 tablet by mouth daily. Elfredia NevinsFusco, Lawrence, MD Taking Active   hydrochlorothiazide (HYDRODIURIL) 25 MG tablet [id number] Yes Take 25 mg by mouth daily. [provider] Taking Active Self  HYDROcodone-acetaminophen (NORCO) 7.5-325 MG tablet [id number] Yes One every four hours for pain as needed.  Do not drive car or operate machinery while taking this medicine.  Must last 14 days. Darreld McleanKeeling, Wayne, MD Taking Active   LORazepam (ATIVAN) 2 MG tablet [id number] Yes 1-2 mg three times a day as needed for anxiety Neysa HotterHisada, Reina, MD Taking Active   Multiple Vitamins-Minerals (HM COMPLETE 50+ MENS ULTIMATE PO) [id number] Yes Take 1 tablet by mouth daily. [provider] Taking Active Self  Papaverine-Alprostadil 30-10 MG-MCG/ML SOLN [id number] Yes by Intracavernosal route. [provider] Taking Active   Potassium  99 MG TABS [id number] Yes Take 99 mg by mouth daily. [provider] Taking Active Self  pregabalin (LYRICA) 200 MG capsule [id number] Yes Take 200 mg by mouth 3 (three) times daily. [provider] Taking Active Self  Specialty Vitamins Products (MM BIOTIN/KERATIN PO) [id number] Yes Take 1 capsule by mouth daily. [provider] Taking Active   tamoxifen (NOLVADEX) 20 MG tablet  [id number] Yes Take 20 mg by mouth daily. [provider] Taking Active Self           Med Note Sueanne Margarita Jan 10, 2016  8:55 AM)    tamsulosin (FLOMAX) 0.4 MG CAPS capsule [id number] Yes Take 1 capsule by mouth daily. Elfredia Nevins, MD Taking Active   WHEY PROTEIN PO [id number] No Take by mouth daily as needed.  [provider] Not Taking Active Self           Med Note Primus Bravo Dec 13, 2016 12:54 PM) Only takes when going to workout           Plan:  Route note to Dr. Vanetta Shawl.  [name], PharmD, BCACP [facility] Clinical Pharmacist [phone]

## 2017-04-15 NOTE — Patient Outreach (Signed)
[facility]) Care Management  04/15/2017  Chuck Hintdward W Kelliher [birth year] [id number]   Care coordination  [facility], Lilian KapurK Boyd to review an out reach call to [name]  Concern voiced with medication administration Dr Vanetta ShawlHisada to be updated per pharmacy staff. Cm encouraged updated to Dr Vanetta ShawlHisada  Plan follow up with [name] this week    Cala BradfordKimberly L. Noelle PennerGibbs, RN, BSN, CCM [facility] Care Management Care Coordinator [phone] week day mobile
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
## 2017-04-16 ENCOUNTER — Encounter (HOSPITAL_COMMUNITY): Payer: Self-pay | Admitting: Psychiatry

## 2017-04-16 ENCOUNTER — Telehealth (HOSPITAL_COMMUNITY): Payer: Self-pay | Admitting: Psychiatry

## 2017-04-16 ENCOUNTER — Ambulatory Visit (HOSPITAL_COMMUNITY): Payer: PPO | Admitting: Psychiatry

## 2017-04-16 VITALS — BP 155/90 | HR 78 | Ht 68.0 in | Wt 229.0 lb

## 2017-04-16 DIAGNOSIS — Z811 Family history of alcohol abuse and dependence: Secondary | ICD-10-CM

## 2017-04-16 DIAGNOSIS — R450 Nervousness: Secondary | ICD-10-CM

## 2017-04-16 DIAGNOSIS — F410 Panic disorder [episodic paroxysmal anxiety] without agoraphobia: Secondary | ICD-10-CM

## 2017-04-16 DIAGNOSIS — F3340 Major depressive disorder, recurrent, in remission, unspecified: Secondary | ICD-10-CM

## 2017-04-16 DIAGNOSIS — Z87891 Personal history of nicotine dependence: Secondary | ICD-10-CM

## 2017-04-16 DIAGNOSIS — F411 Generalized anxiety disorder: Secondary | ICD-10-CM

## 2017-04-16 DIAGNOSIS — R251 Tremor, unspecified: Secondary | ICD-10-CM | POA: Diagnosis not present

## 2017-04-16 MED ORDER — LORAZEPAM 2 MG PO TABS: ORAL_TABLET | ORAL | 1 refills | Status: DC

## 2017-04-16 MED ORDER — BUSPIRONE HCL 15 MG PO TABS: 15.0000 mg | ORAL_TABLET | Freq: Three times a day (TID) | ORAL | 1 refills | Status: DC

## 2017-04-16 NOTE — Patient Instructions (Addendum)
1. Discontinue sertraline 2. Increase buspar 15 mg three times a day  3. Continue ativan 1-2 mg three times a day as needed for anxiety  4. Return to clinic in two months for 30 mins

## 2017-04-16 NOTE — Telephone Encounter (Signed)
Received a record from Dr. Dwana MelenaZack Hall, last visit on 02/18/2017.   Per UDS,  "positive for pain medication not prescribed to patient" hydrocodone, dihydrocodein, norhydrocodone;

## 2017-04-17 ENCOUNTER — Ambulatory Visit: Payer: Self-pay | Admitting: Orthopaedic Surgery

## 2017-04-18 ENCOUNTER — Ambulatory Visit (HOSPITAL_COMMUNITY): Payer: PPO | Admitting: Licensed Clinical Social Worker

## 2017-04-19 DIAGNOSIS — G9009 Other idiopathic peripheral autonomic neuropathy: Secondary | ICD-10-CM | POA: Diagnosis not present

## 2017-04-19 DIAGNOSIS — B370 Candidal stomatitis: Secondary | ICD-10-CM | POA: Diagnosis not present

## 2017-04-19 DIAGNOSIS — H6500 Acute serous otitis media, unspecified ear: Secondary | ICD-10-CM | POA: Diagnosis not present

## 2017-04-19 DIAGNOSIS — R070 Pain in throat: Secondary | ICD-10-CM | POA: Diagnosis not present

## 2017-04-23 ENCOUNTER — Other Ambulatory Visit: Payer: Self-pay | Admitting: Pharmacist

## 2017-04-23 NOTE — Patient Outreach (Addendum)
[facility]) Care Management  04/23/2017  Wesley Ho [birth year] [id number]   Patient was called to follow up with him after his appointment with Dr. Vanetta Shawl. HIPAA identifiers were obtained.  Patient confirmed he saw Dr. Vanetta Shawl and she increased his Buspirone 15mg  to 1.5 tablets three times daily.  He said the increased dose seems to be working well as his anxiety has decreased.    Patient did not report any other medication issues. [facility] was called on the patients behalf and they confirmed the patient not had any other narcotic or controlled substances filled other than the lorazepam Dr. Vanetta Shawl is prescribing.   Medications Reviewed Today    Reviewed by Rushie Chestnut, RMA (Registered Medical Assistant) on 04/16/17 at 1045  Med List Status: <None>  Medication Order Taking? Sig Documenting Provider Last Dose Status Informant  acetaminophen (TYLENOL) 500 MG tablet [id number] No Take 500-1,000 mg by mouth every 6 (six) hours as needed for mild pain. [provider] Taking Active Self  ANDROGEL PUMP 20.25 MG/ACT (1.62%) GEL [id number] No Use 1 pump in the morning Roma Kayser, MD Taking Active   aspirin EC 81 MG tablet [id number] No Take 81 mg by mouth daily. [provider] Taking Active Self  aspirin-sod bicarb-citric acid (ALKA-SELTZER) 325 MG TBEF tablet [id number] No Take 325 mg by mouth every 6 (six) hours as needed. [provider] Taking Active Self  busPIRone (BUSPAR) 10 MG tablet [id number] No Take 10 mg by mouth 3 (three) times daily.  [provider] Taking Active Self           Med Note Primus Bravo Dec 13, 2016 12:52 PM)    Calcium Carb-Cholecalciferol (CALCIUM 1000 + D PO) [id number] No Take 1 tablet by mouth daily. [provider] Taking Active   carboxymethylcellulose (REFRESH TEARS) 0.5 % SOLN [id number] No 1 drop 2 (two) times daily as needed.  [provider] Taking Active  Self  cholecalciferol (VITAMIN D) 1000 units tablet [id number] No Take 1,000 Units by mouth daily. [provider] Taking Active Self  clobetasol cream (TEMOVATE) 0.05 % [id number] No Apply to the affected area as directed [provider] Taking Active   Cyanocobalamin 2500 MCG TABS [id number] No Take 1 tablet by mouth daily.  [provider] Taking Active   ferrous sulfate (IRON SUPPLEMENT) 325 (65 FE) MG tablet [id number] No Take 65 mg by mouth daily with breakfast. [provider] Taking Active Self  furosemide (LASIX) 20 MG tablet [id number] No Take 1 tablet by mouth daily. Elfredia Nevins, MD Taking Active   hydrochlorothiazide (HYDRODIURIL) 25 MG tablet [id number] No Take 25 mg by mouth daily. [provider] Taking Active Self  HYDROcodone-acetaminophen (NORCO) 7.5-325 MG tablet [id number] No One every four hours for pain as needed.  Do not drive car or operate machinery while taking this medicine.  Must last 14 days. Darreld Mclean, MD Taking Active   LORazepam (ATIVAN) 2 MG tablet [id number] No 1-2 mg three times a day as needed for anxiety Neysa Hotter, MD Taking Active   Multiple Vitamins-Minerals (HM COMPLETE 50+ MENS ULTIMATE PO) [id number] No Take 1 tablet by mouth daily. [provider] Taking Active Self  Papaverine-Alprostadil 30-10 MG-MCG/ML SOLN [id number] No by Intracavernosal route. [provider] Taking Active   Potassium 99 MG TABS [id number] No Take 99 mg by mouth daily. [provider] Taking Active Self  pregabalin (LYRICA) 200 MG capsule [id number] No Take 200  mg by mouth 3 (three) times daily. [provider] Taking Active Self  Specialty Vitamins Products (MM BIOTIN/KERATIN PO) [id number] No Take 1 capsule by mouth daily. [provider] Taking Active   tamoxifen (NOLVADEX) 20 MG tablet [id number] No Take 20 mg by mouth daily. [provider] Taking Active Self           Med Note  Wesley Ho(Ho, Wesley T   Tue Jan 10, 2016  8:55 AM)    tamsulosin (FLOMAX) 0.4 MG CAPS capsule [id number] No Take 1 capsule by mouth daily. Elfredia NevinsFusco, Lawrence, MD Taking Active   WHEY PROTEIN PO [id number] No Take by mouth daily as needed.  [provider] Not Taking Active Self           Med Note Primus Bravo(Wesley Ho   Thu Dec 13, 2016 12:54 PM) Only takes when going to workout          '  Plan:  Close patient's case as he does not have any other pharmacy needs.  Patient has my number and understands that he can call me at anytime with pharmacy questions or concerns.  Patient's provider will be sent a discipline closure letter.  Beecher McardleKatina Ho. Carrell Rahmani, PharmD, BCACP [facility] Clinical Pharmacist [phone]

## 2017-05-07 ENCOUNTER — Encounter (HOSPITAL_COMMUNITY): Payer: Self-pay | Admitting: Licensed Clinical Social Worker

## 2017-05-07 ENCOUNTER — Ambulatory Visit (HOSPITAL_COMMUNITY): Payer: PPO | Admitting: Licensed Clinical Social Worker

## 2017-05-07 DIAGNOSIS — F411 Generalized anxiety disorder: Secondary | ICD-10-CM | POA: Diagnosis not present

## 2017-05-07 NOTE — Progress Notes (Signed)
   THERAPIST PROGRESS NOTE  Session Time: 8:00 am-8:50 am  Participation Level: Active  Behavioral Response: CasualAlertAnxious  Type of Therapy: Individual Therapy  Treatment Goals addressed: Anxiety  Interventions: CBT and Solution Focused  Summary: Wesley Ho is a 62 y.o. male who presents oriented x5 (person, place, situation, time, and object), alert, average height, overweight, casually dressed, appropriately groomed, and cooperative to address anxiety. Patient has a history of medical treatment including COPD, DVT, and chronic pain. Patient has a history of mental health treatment including medication management. Patient denies symptoms of mania. Patient denies suicidal and homicidal ideations. Patient denies psychosis including auditory and visual hallucinations. Patient denies substance use. Patient denies history of elopement. Patient is at low risk for lethality.   Physically: Patient reported that he has been feeling well physically. He has experienced some serious tremors in his hands due to medication. Patient also noted that his sleep is off but he is coping with it.  Spiritually/values: Patient is spiritually healthy. He is firm in his faith and watches his church on television each week. He is planning on going back to his congregation soon.  Relationships: Patient has a good relationship with his ex wife. She is a big support for him. He also noted that he has a strong relationship with his son. Patient has a strained relationship with his daughter. He has tried to reach out to her numerous times by text since that is her preferred way of communication but got no response.  Emotionally/Mentally/Behavior: Patient noted an increase in anxiety related to his upcoming court date. He is worried about going to jail. Patient also noted that a source of anxiety is his mother's health and when she may pass away. He is always waiting for the phone call that she has passed. Patient is  also concerned with his son's lifestyle choices and worries that he may be making some dangerous choices. Patient identified that support from his ex and son, listening to music and watching television helps him with his anxiety.   Patient engaged in session. He responded well to interventions. Patient continues to meet criteria for Generalized Anxiety Disorder. Patient will continue in outpatient therapy due to being the least restrictive service to meet his needs. Patient made no progress on his goals at this time.   Suicidal/Homicidal: Negativewithout intent/plan  Therapist Response: Therapist reviewed patient's recent thoughts and behaviors. Therapist utilized CBT to address anxiety. Therapist processed patient's feelings to identify triggers for anxiety. Therapist assisted patient in identifying ways he manages his anxiety.   Plan: Return again in 2-3 weeks.  Diagnosis: Axis I: Generalized Anxiety Disorder    Axis II: No diagnosis    Bynum BellowsJoshua Renu Asby, LCSW 05/07/2017

## 2017-05-09 DIAGNOSIS — K1379 Other lesions of oral mucosa: Secondary | ICD-10-CM | POA: Diagnosis not present

## 2017-05-09 DIAGNOSIS — G9009 Other idiopathic peripheral autonomic neuropathy: Secondary | ICD-10-CM | POA: Diagnosis not present

## 2017-05-09 DIAGNOSIS — Z6835 Body mass index (BMI) 35.0-35.9, adult: Secondary | ICD-10-CM | POA: Diagnosis not present

## 2017-05-09 DIAGNOSIS — F419 Anxiety disorder, unspecified: Secondary | ICD-10-CM | POA: Diagnosis not present

## 2017-05-21 ENCOUNTER — Ambulatory Visit (HOSPITAL_COMMUNITY): Payer: PPO | Admitting: Licensed Clinical Social Worker

## 2017-05-27 DIAGNOSIS — R070 Pain in throat: Secondary | ICD-10-CM | POA: Diagnosis not present

## 2017-05-27 DIAGNOSIS — B370 Candidal stomatitis: Secondary | ICD-10-CM | POA: Diagnosis not present

## 2017-05-27 DIAGNOSIS — Z6834 Body mass index (BMI) 34.0-34.9, adult: Secondary | ICD-10-CM | POA: Diagnosis not present

## 2017-05-27 DIAGNOSIS — I10 Essential (primary) hypertension: Secondary | ICD-10-CM | POA: Diagnosis not present

## 2017-05-27 DIAGNOSIS — L659 Nonscarring hair loss, unspecified: Secondary | ICD-10-CM | POA: Diagnosis not present

## 2017-05-27 DIAGNOSIS — F419 Anxiety disorder, unspecified: Secondary | ICD-10-CM | POA: Diagnosis not present

## 2017-05-27 DIAGNOSIS — E782 Mixed hyperlipidemia: Secondary | ICD-10-CM | POA: Diagnosis not present

## 2017-05-27 DIAGNOSIS — J029 Acute pharyngitis, unspecified: Secondary | ICD-10-CM | POA: Diagnosis not present

## 2017-05-27 DIAGNOSIS — N400 Enlarged prostate without lower urinary tract symptoms: Secondary | ICD-10-CM | POA: Diagnosis not present

## 2017-05-27 DIAGNOSIS — N62 Hypertrophy of breast: Secondary | ICD-10-CM | POA: Diagnosis not present

## 2017-05-27 DIAGNOSIS — G9009 Other idiopathic peripheral autonomic neuropathy: Secondary | ICD-10-CM | POA: Diagnosis not present

## 2017-05-27 DIAGNOSIS — H6500 Acute serous otitis media, unspecified ear: Secondary | ICD-10-CM | POA: Diagnosis not present

## 2017-05-28 ENCOUNTER — Ambulatory Visit (INDEPENDENT_AMBULATORY_CARE_PROVIDER_SITE_OTHER): Payer: PPO | Admitting: Urology

## 2017-05-28 DIAGNOSIS — N5201 Erectile dysfunction due to arterial insufficiency: Secondary | ICD-10-CM

## 2017-05-28 DIAGNOSIS — E291 Testicular hypofunction: Secondary | ICD-10-CM

## 2017-05-29 DIAGNOSIS — L600 Ingrowing nail: Secondary | ICD-10-CM | POA: Diagnosis not present

## 2017-05-29 DIAGNOSIS — S91109A Unspecified open wound of unspecified toe(s) without damage to nail, initial encounter: Secondary | ICD-10-CM | POA: Diagnosis not present

## 2017-06-03 DIAGNOSIS — L600 Ingrowing nail: Secondary | ICD-10-CM | POA: Diagnosis not present

## 2017-06-03 DIAGNOSIS — S91109A Unspecified open wound of unspecified toe(s) without damage to nail, initial encounter: Secondary | ICD-10-CM | POA: Diagnosis not present

## 2017-06-03 DIAGNOSIS — M79675 Pain in left toe(s): Secondary | ICD-10-CM | POA: Diagnosis not present

## 2017-06-04 ENCOUNTER — Ambulatory Visit (INDEPENDENT_AMBULATORY_CARE_PROVIDER_SITE_OTHER): Payer: PPO | Admitting: Urology

## 2017-06-04 ENCOUNTER — Ambulatory Visit (HOSPITAL_COMMUNITY): Payer: PPO | Admitting: Licensed Clinical Social Worker

## 2017-06-04 ENCOUNTER — Encounter (HOSPITAL_COMMUNITY): Payer: Self-pay | Admitting: Licensed Clinical Social Worker

## 2017-06-04 DIAGNOSIS — F411 Generalized anxiety disorder: Secondary | ICD-10-CM

## 2017-06-04 DIAGNOSIS — N5201 Erectile dysfunction due to arterial insufficiency: Secondary | ICD-10-CM | POA: Diagnosis not present
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
## 2017-06-06 ENCOUNTER — Other Ambulatory Visit (HOSPITAL_COMMUNITY): Payer: Self-pay | Admitting: Psychiatry

## 2017-06-06 ENCOUNTER — Telehealth (HOSPITAL_COMMUNITY): Payer: Self-pay | Admitting: *Deleted

## 2017-06-06 ENCOUNTER — Ambulatory Visit (HOSPITAL_COMMUNITY): Payer: Self-pay | Admitting: Psychiatry

## 2017-06-06 MED ORDER — BUSPIRONE HCL 15 MG PO TABS: 15.0000 mg | ORAL_TABLET | Freq: Three times a day (TID) | ORAL | 0 refills | Status: DC

## 2017-06-06 NOTE — Telephone Encounter (Signed)
Dr  Vanetta ShawlHisada Xanax refill only received from you. Requesting refill

## 2017-06-06 NOTE — Telephone Encounter (Signed)
Could you ask the pharmacy about this. According to the database, he has received xanax 05/09/2017
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
## 2017-06-06 NOTE — Telephone Encounter (Signed)
   It seems like he has received xanax from other physician in March. Could you verify this with the patient? I will not able to prescribe ativan if he receives benzodiazepine (Xanax) from other physician.  Ordered buspar.   Per PMP,  Xanax 1 mg filled on 05/09/2017, 90 for 23 days by Kyra A Mcclanahan  Ativan filled on 05/15/2017, 90 tabs for 30 days

## 2017-06-07 ENCOUNTER — Encounter (HOSPITAL_COMMUNITY): Payer: Self-pay | Admitting: Psychiatry

## 2017-06-07 ENCOUNTER — Telehealth (HOSPITAL_COMMUNITY): Payer: Self-pay | Admitting: *Deleted

## 2017-06-07 NOTE — Telephone Encounter (Signed)
Dr Vanetta ShawlHisada [facility] has in their system that  On 05/09/17 Dr Dwana MelenaZack Hall Prescribed Xanax for patient.

## 2017-06-07 NOTE — Telephone Encounter (Signed)
This encounter was created in error - please disregard.

## 2017-06-07 NOTE — Telephone Encounter (Signed)
Noted. Given the fact that he got Xanax filled, I would not be able to order another benzodiazepine anymore as we discussed at the initial visit. I believe he still has ativan left; advise him to take lower dose to avoid withdrawal symptoms.  Ativan 1 mg three times a day as needed for 4 days, then 0.5 mg three times a day as needed. Instruct him to go to urgent care/ED if any worsening tremors, confusion.

## 2017-06-10 ENCOUNTER — Telehealth (HOSPITAL_COMMUNITY): Payer: Self-pay | Admitting: *Deleted

## 2017-06-10 NOTE — Telephone Encounter (Signed)
Per Dr Rockne CoonsHisada   Called to notify patient:  Noted. Given the fact that he got Xanax filled, I would not be able to order another benzodiazepine anymore as we discussed at the initial visit. I believe he still has ativan left; advise him to take lower dose to avoid withdrawal symptoms.  Ativan 1 mg three times a day as needed for 4 days, then 0.5 mg three times a day as needed. Instruct him to go to urgent care/ED if any signs of withdrawal symptoms, such as worsening tremors, confusion.   *Patient stated that he taken 2 mg of the Ativan not 1-2 mg as needed

## 2017-06-11 ENCOUNTER — Other Ambulatory Visit: Payer: Self-pay | Admitting: *Deleted

## 2017-06-12 DIAGNOSIS — G9009 Other idiopathic peripheral autonomic neuropathy: Secondary | ICD-10-CM | POA: Diagnosis not present

## 2017-06-12 DIAGNOSIS — R070 Pain in throat: Secondary | ICD-10-CM | POA: Diagnosis not present

## 2017-06-12 DIAGNOSIS — Z6836 Body mass index (BMI) 36.0-36.9, adult: Secondary | ICD-10-CM | POA: Diagnosis not present

## 2017-06-12 DIAGNOSIS — J029 Acute pharyngitis, unspecified: Secondary | ICD-10-CM | POA: Diagnosis not present

## 2017-06-12 DIAGNOSIS — I10 Essential (primary) hypertension: Secondary | ICD-10-CM | POA: Diagnosis not present

## 2017-06-12 DIAGNOSIS — N62 Hypertrophy of breast: Secondary | ICD-10-CM | POA: Diagnosis not present

## 2017-06-12 DIAGNOSIS — H6500 Acute serous otitis media, unspecified ear: Secondary | ICD-10-CM | POA: Diagnosis not present

## 2017-06-12 DIAGNOSIS — N400 Enlarged prostate without lower urinary tract symptoms: Secondary | ICD-10-CM | POA: Diagnosis not present

## 2017-06-12 DIAGNOSIS — B370 Candidal stomatitis: Secondary | ICD-10-CM | POA: Diagnosis not present

## 2017-06-12 DIAGNOSIS — F419 Anxiety disorder, unspecified: Secondary | ICD-10-CM | POA: Diagnosis not present

## 2017-06-12 DIAGNOSIS — E782 Mixed hyperlipidemia: Secondary | ICD-10-CM | POA: Diagnosis not present

## 2017-06-12 DIAGNOSIS — L659 Nonscarring hair loss, unspecified: Secondary | ICD-10-CM | POA: Diagnosis not present
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
## 2017-06-17 DIAGNOSIS — M79675 Pain in left toe(s): Secondary | ICD-10-CM | POA: Diagnosis not present

## 2017-06-17 DIAGNOSIS — M79674 Pain in right toe(s): Secondary | ICD-10-CM | POA: Diagnosis not present

## 2017-06-17 DIAGNOSIS — S91109A Unspecified open wound of unspecified toe(s) without damage to nail, initial encounter: Secondary | ICD-10-CM | POA: Diagnosis not present

## 2017-06-26 ENCOUNTER — Telehealth (HOSPITAL_COMMUNITY): Payer: Self-pay | Admitting: *Deleted

## 2017-06-26 NOTE — Telephone Encounter (Signed)
Dr Vanetta ShawlHisada  Patient called requesting refill on Ativan

## 2017-06-26 NOTE — Telephone Encounter (Signed)
Dr Vanetta ShawlHisada Per Rx  Last script for Ativan is from you. Elder NegusKyra McClanahan is in the office with Dr Margo AyeHall & they ordered the Xanax

## 2017-06-26 NOTE — Telephone Encounter (Signed)
Dr Richardo PriestHisada  Spoke with Rx last refill on Ativan was 05/15/17

## 2017-06-26 NOTE — Telephone Encounter (Signed)
Discussed with the pharmacy. Xanax was dispensed on 06/17/2017 as in database. Will send a letter for discharge given patient misuse of medication. Please inform the patient that I will not prescribe ativan anymore as we discussed in the past. I will order refill of buspar for a month.

## 2017-06-26 NOTE — Telephone Encounter (Signed)
Could you contact the pharmacy first- per database, he is filled xanax on 06/17/2017

## 2017-06-26 NOTE — Telephone Encounter (Signed)
Do they have any idea why it is listed on database then? It seems to be prescribed by Wesley Ho. If the patient does not receive any medication, the database needs to be corrected.

## 2017-06-27 NOTE — Telephone Encounter (Signed)
Spoke with patient & informed per Dr Vanetta ShawlHisada: Please inform the patient that I will not prescribe ativan anymore as we discussed in the past. I will order refill of buspar for a month.   And then patient asked me to cancel his appointment for next week.

## 2017-06-28 ENCOUNTER — Encounter (HOSPITAL_COMMUNITY): Payer: Self-pay | Admitting: Psychiatry

## 2017-06-28 ENCOUNTER — Other Ambulatory Visit (HOSPITAL_COMMUNITY): Payer: Self-pay | Admitting: Psychiatry

## 2017-06-28 ENCOUNTER — Telehealth (HOSPITAL_COMMUNITY): Payer: Self-pay | Admitting: Psychiatry

## 2017-06-28 MED ORDER — BUSPIRONE HCL 15 MG PO TABS: 15.0000 mg | ORAL_TABLET | Freq: Three times a day (TID) | ORAL | 0 refills | Status: AC

## 2017-06-28 NOTE — Telephone Encounter (Signed)
Will discharge from the clinic given non adherence to treatment/advice. Ativan will not be ordered to avoid polypharmacy given he has been getting xanax from the other provider per PMP database and the pharmacy.

## 2017-07-02 ENCOUNTER — Ambulatory Visit (HOSPITAL_COMMUNITY): Payer: PPO | Admitting: Psychiatry

## 2017-07-15 DIAGNOSIS — M79674 Pain in right toe(s): Secondary | ICD-10-CM | POA: Diagnosis not present

## 2017-07-15 DIAGNOSIS — S91109A Unspecified open wound of unspecified toe(s) without damage to nail, initial encounter: Secondary | ICD-10-CM | POA: Diagnosis not present

## 2017-07-15 DIAGNOSIS — G629 Polyneuropathy, unspecified: Secondary | ICD-10-CM | POA: Diagnosis not present

## 2017-07-15 DIAGNOSIS — M79675 Pain in left toe(s): Secondary | ICD-10-CM | POA: Diagnosis not present

## 2017-07-17 DIAGNOSIS — N400 Enlarged prostate without lower urinary tract symptoms: Secondary | ICD-10-CM | POA: Diagnosis not present

## 2017-07-17 DIAGNOSIS — E782 Mixed hyperlipidemia: Secondary | ICD-10-CM | POA: Diagnosis not present

## 2017-07-19 DIAGNOSIS — N400 Enlarged prostate without lower urinary tract symptoms: Secondary | ICD-10-CM | POA: Diagnosis not present

## 2017-07-19 DIAGNOSIS — F419 Anxiety disorder, unspecified: Secondary | ICD-10-CM | POA: Diagnosis not present

## 2017-07-19 DIAGNOSIS — Z6833 Body mass index (BMI) 33.0-33.9, adult: Secondary | ICD-10-CM | POA: Diagnosis not present

## 2017-07-19 DIAGNOSIS — I10 Essential (primary) hypertension: Secondary | ICD-10-CM | POA: Diagnosis not present

## 2017-07-19 DIAGNOSIS — G9009 Other idiopathic peripheral autonomic neuropathy: Secondary | ICD-10-CM | POA: Diagnosis not present

## 2017-07-19 DIAGNOSIS — E782 Mixed hyperlipidemia: Secondary | ICD-10-CM | POA: Diagnosis not present

## 2017-07-19 DIAGNOSIS — R7301 Impaired fasting glucose: Secondary | ICD-10-CM | POA: Diagnosis not present

## 2017-07-19 DIAGNOSIS — E291 Testicular hypofunction: Secondary | ICD-10-CM | POA: Diagnosis not present

## 2017-08-21 DIAGNOSIS — E291 Testicular hypofunction: Secondary | ICD-10-CM | POA: Diagnosis not present

## 2017-08-26 DIAGNOSIS — S91109A Unspecified open wound of unspecified toe(s) without damage to nail, initial encounter: Secondary | ICD-10-CM | POA: Diagnosis not present

## 2017-08-26 DIAGNOSIS — G629 Polyneuropathy, unspecified: Secondary | ICD-10-CM | POA: Diagnosis not present

## 2017-08-27 ENCOUNTER — Ambulatory Visit: Payer: PPO | Admitting: Urology

## 2017-08-27 DIAGNOSIS — E291 Testicular hypofunction: Secondary | ICD-10-CM

## 2017-08-27 DIAGNOSIS — N5201 Erectile dysfunction due to arterial insufficiency: Secondary | ICD-10-CM | POA: Diagnosis not present

## 2017-09-02 DIAGNOSIS — R070 Pain in throat: Secondary | ICD-10-CM | POA: Diagnosis not present

## 2017-09-02 DIAGNOSIS — G9009 Other idiopathic peripheral autonomic neuropathy: Secondary | ICD-10-CM | POA: Diagnosis not present

## 2017-09-02 DIAGNOSIS — L239 Allergic contact dermatitis, unspecified cause: Secondary | ICD-10-CM | POA: Diagnosis not present

## 2017-09-02 DIAGNOSIS — H6500 Acute serous otitis media, unspecified ear: Secondary | ICD-10-CM | POA: Diagnosis not present

## 2017-09-02 DIAGNOSIS — N400 Enlarged prostate without lower urinary tract symptoms: Secondary | ICD-10-CM | POA: Diagnosis not present

## 2017-09-02 DIAGNOSIS — F419 Anxiety disorder, unspecified: Secondary | ICD-10-CM | POA: Diagnosis not present

## 2017-09-02 DIAGNOSIS — N62 Hypertrophy of breast: Secondary | ICD-10-CM | POA: Diagnosis not present

## 2017-09-02 DIAGNOSIS — B370 Candidal stomatitis: Secondary | ICD-10-CM | POA: Diagnosis not present

## 2017-09-02 DIAGNOSIS — Z6834 Body mass index (BMI) 34.0-34.9, adult: Secondary | ICD-10-CM | POA: Diagnosis not present

## 2017-09-02 DIAGNOSIS — I10 Essential (primary) hypertension: Secondary | ICD-10-CM | POA: Diagnosis not present

## 2017-09-02 DIAGNOSIS — E782 Mixed hyperlipidemia: Secondary | ICD-10-CM | POA: Diagnosis not present

## 2017-09-02 DIAGNOSIS — J029 Acute pharyngitis, unspecified: Secondary | ICD-10-CM | POA: Diagnosis not present

## 2017-09-16 DIAGNOSIS — G629 Polyneuropathy, unspecified: Secondary | ICD-10-CM | POA: Diagnosis not present

## 2017-09-16 DIAGNOSIS — S91109A Unspecified open wound of unspecified toe(s) without damage to nail, initial encounter: Secondary | ICD-10-CM | POA: Diagnosis not present

## 2017-09-17 DIAGNOSIS — I10 Essential (primary) hypertension: Secondary | ICD-10-CM | POA: Diagnosis not present

## 2017-09-17 DIAGNOSIS — F419 Anxiety disorder, unspecified: Secondary | ICD-10-CM | POA: Diagnosis not present

## 2017-09-17 DIAGNOSIS — R7301 Impaired fasting glucose: Secondary | ICD-10-CM | POA: Diagnosis not present

## 2017-09-17 DIAGNOSIS — J029 Acute pharyngitis, unspecified: Secondary | ICD-10-CM | POA: Diagnosis not present

## 2017-09-17 DIAGNOSIS — E782 Mixed hyperlipidemia: Secondary | ICD-10-CM | POA: Diagnosis not present

## 2017-10-18 DIAGNOSIS — E782 Mixed hyperlipidemia: Secondary | ICD-10-CM | POA: Diagnosis not present

## 2017-10-18 DIAGNOSIS — N400 Enlarged prostate without lower urinary tract symptoms: Secondary | ICD-10-CM | POA: Diagnosis not present

## 2017-10-18 DIAGNOSIS — R7301 Impaired fasting glucose: Secondary | ICD-10-CM | POA: Diagnosis not present

## 2017-10-18 DIAGNOSIS — G9009 Other idiopathic peripheral autonomic neuropathy: Secondary | ICD-10-CM | POA: Diagnosis not present

## 2017-10-18 DIAGNOSIS — N62 Hypertrophy of breast: Secondary | ICD-10-CM | POA: Diagnosis not present

## 2017-10-18 DIAGNOSIS — F419 Anxiety disorder, unspecified: Secondary | ICD-10-CM | POA: Diagnosis not present

## 2017-10-18 DIAGNOSIS — I10 Essential (primary) hypertension: Secondary | ICD-10-CM | POA: Diagnosis not present

## 2017-10-18 DIAGNOSIS — H6500 Acute serous otitis media, unspecified ear: Secondary | ICD-10-CM | POA: Diagnosis not present

## 2017-10-18 DIAGNOSIS — L239 Allergic contact dermatitis, unspecified cause: Secondary | ICD-10-CM | POA: Diagnosis not present

## 2017-11-18 DIAGNOSIS — S91109A Unspecified open wound of unspecified toe(s) without damage to nail, initial encounter: Secondary | ICD-10-CM | POA: Diagnosis not present

## 2017-11-18 DIAGNOSIS — G629 Polyneuropathy, unspecified: Secondary | ICD-10-CM | POA: Diagnosis not present

## 2017-11-18 DIAGNOSIS — S91109S Unspecified open wound of unspecified toe(s) without damage to nail, sequela: Secondary | ICD-10-CM | POA: Diagnosis not present

## 2017-12-23 DIAGNOSIS — L309 Dermatitis, unspecified: Secondary | ICD-10-CM | POA: Diagnosis not present

## 2017-12-23 DIAGNOSIS — L409 Psoriasis, unspecified: Secondary | ICD-10-CM | POA: Diagnosis not present

## 2017-12-23 DIAGNOSIS — L97511 Non-pressure chronic ulcer of other part of right foot limited to breakdown of skin: Secondary | ICD-10-CM | POA: Diagnosis not present

## 2017-12-24 DIAGNOSIS — B370 Candidal stomatitis: Secondary | ICD-10-CM | POA: Diagnosis not present

## 2017-12-24 DIAGNOSIS — N62 Hypertrophy of breast: Secondary | ICD-10-CM | POA: Diagnosis not present

## 2017-12-24 DIAGNOSIS — E782 Mixed hyperlipidemia: Secondary | ICD-10-CM | POA: Diagnosis not present

## 2017-12-24 DIAGNOSIS — G9009 Other idiopathic peripheral autonomic neuropathy: Secondary | ICD-10-CM | POA: Diagnosis not present

## 2017-12-24 DIAGNOSIS — F419 Anxiety disorder, unspecified: Secondary | ICD-10-CM | POA: Diagnosis not present

## 2017-12-24 DIAGNOSIS — E291 Testicular hypofunction: Secondary | ICD-10-CM | POA: Diagnosis not present

## 2017-12-24 DIAGNOSIS — R7301 Impaired fasting glucose: Secondary | ICD-10-CM | POA: Diagnosis not present

## 2017-12-24 DIAGNOSIS — I10 Essential (primary) hypertension: Secondary | ICD-10-CM | POA: Diagnosis not present

## 2018-01-10 ENCOUNTER — Other Ambulatory Visit: Payer: Self-pay

## 2018-01-10 NOTE — Patient Outreach (Signed)
[facility]) Care Management  01/10/2018  Wesley Ho [birth year] [id number]   Referral Date: 01/10/18 Referral Source: Nurseline Referral Reason: Question about injections   Outreach Attempt: spoke with patient.  He is able to verify HIPAA.  Patient states that he had questions about his injection.  He thinks that he is doing it correctly but wanted to be sure. Advised patient patient that the best thing would be to have someone from his physician's office to watch him draw up the medication and administer it.  He verbalized understanding.  Patient voices no other questions or concerns.     Plan: RN CM will close case.     [name], RN, MSN [facility] Care Management Care Management Coordinator Direct Line [phone] Toll Free: [phone]  Fax: [phone]

## 2018-01-13 DIAGNOSIS — L97511 Non-pressure chronic ulcer of other part of right foot limited to breakdown of skin: Secondary | ICD-10-CM | POA: Diagnosis not present

## 2018-01-13 DIAGNOSIS — L409 Psoriasis, unspecified: Secondary | ICD-10-CM | POA: Diagnosis not present

## 2018-01-13 DIAGNOSIS — L309 Dermatitis, unspecified: Secondary | ICD-10-CM | POA: Diagnosis not present

## 2018-01-14 DIAGNOSIS — L219 Seborrheic dermatitis, unspecified: Secondary | ICD-10-CM | POA: Diagnosis not present

## 2018-01-14 DIAGNOSIS — L630 Alopecia (capitis) totalis: Secondary | ICD-10-CM | POA: Diagnosis not present

## 2018-01-14 DIAGNOSIS — L210 Seborrhea capitis: Secondary | ICD-10-CM | POA: Diagnosis not present

## 2018-01-17 DIAGNOSIS — E291 Testicular hypofunction: Secondary | ICD-10-CM | POA: Diagnosis not present

## 2018-01-17 DIAGNOSIS — N400 Enlarged prostate without lower urinary tract symptoms: Secondary | ICD-10-CM | POA: Diagnosis not present

## 2018-01-17 DIAGNOSIS — R7301 Impaired fasting glucose: Secondary | ICD-10-CM | POA: Diagnosis not present

## 2018-01-17 DIAGNOSIS — E782 Mixed hyperlipidemia: Secondary | ICD-10-CM | POA: Diagnosis not present

## 2018-01-17 DIAGNOSIS — I10 Essential (primary) hypertension: Secondary | ICD-10-CM | POA: Diagnosis not present

## 2018-01-23 ENCOUNTER — Other Ambulatory Visit: Payer: Self-pay

## 2018-01-23 DIAGNOSIS — Z0000 Encounter for general adult medical examination without abnormal findings: Secondary | ICD-10-CM | POA: Diagnosis not present

## 2018-01-23 DIAGNOSIS — E291 Testicular hypofunction: Secondary | ICD-10-CM | POA: Diagnosis not present

## 2018-01-23 DIAGNOSIS — E870 Hyperosmolality and hypernatremia: Secondary | ICD-10-CM | POA: Diagnosis not present

## 2018-01-23 DIAGNOSIS — J060 Acute laryngopharyngitis: Secondary | ICD-10-CM | POA: Diagnosis not present

## 2018-01-23 DIAGNOSIS — N400 Enlarged prostate without lower urinary tract symptoms: Secondary | ICD-10-CM | POA: Diagnosis not present

## 2018-01-23 DIAGNOSIS — E1169 Type 2 diabetes mellitus with other specified complication: Secondary | ICD-10-CM | POA: Diagnosis not present

## 2018-01-23 DIAGNOSIS — I10 Essential (primary) hypertension: Secondary | ICD-10-CM | POA: Diagnosis not present

## 2018-01-23 DIAGNOSIS — E782 Mixed hyperlipidemia: Secondary | ICD-10-CM | POA: Diagnosis not present

## 2018-01-23 NOTE — Patient Outreach (Signed)
[facility]) Care Management  01/23/2018  Wesley Ho 1955/08/28 [id number]   Referral Date: 01/22/18 Referral Source: Nurseline Referral Reason: Questions about injection   Outreach Attempt: spoke with patient. He states he had some questions about how to administer injection to himself.  He states that he is seeing the physician today.  Advised patient to take medication with him and have someone in the office show him how to pull up the medication and administer.  Also advised that giving injections is something that needs to be shown in person and not via phone call.  He verbalized understanding and declines any further needs.    Plan: RN CM will close case.     Wesley Lericheionne J Daimen Shovlin, RN, MSN [facility] Care Management Care Management Coordinator Direct Line [phone] Toll Free: [phone]  Fax: [phone]

## 2018-02-05 DIAGNOSIS — I10 Essential (primary) hypertension: Secondary | ICD-10-CM | POA: Diagnosis not present

## 2018-02-05 DIAGNOSIS — E1169 Type 2 diabetes mellitus with other specified complication: Secondary | ICD-10-CM | POA: Diagnosis not present

## 2018-02-05 DIAGNOSIS — N400 Enlarged prostate without lower urinary tract symptoms: Secondary | ICD-10-CM | POA: Diagnosis not present

## 2018-02-05 DIAGNOSIS — E782 Mixed hyperlipidemia: Secondary | ICD-10-CM | POA: Diagnosis not present

## 2018-02-17 DIAGNOSIS — L409 Psoriasis, unspecified: Secondary | ICD-10-CM | POA: Diagnosis not present

## 2018-02-17 DIAGNOSIS — L97511 Non-pressure chronic ulcer of other part of right foot limited to breakdown of skin: Secondary | ICD-10-CM | POA: Diagnosis not present

## 2018-02-17 DIAGNOSIS — L309 Dermatitis, unspecified: Secondary | ICD-10-CM | POA: Diagnosis not present

## 2018-03-12 DIAGNOSIS — I10 Essential (primary) hypertension: Secondary | ICD-10-CM | POA: Diagnosis not present

## 2018-03-12 DIAGNOSIS — E782 Mixed hyperlipidemia: Secondary | ICD-10-CM | POA: Diagnosis not present

## 2018-03-12 DIAGNOSIS — E1169 Type 2 diabetes mellitus with other specified complication: Secondary | ICD-10-CM | POA: Diagnosis not present

## 2018-03-12 DIAGNOSIS — N400 Enlarged prostate without lower urinary tract symptoms: Secondary | ICD-10-CM | POA: Diagnosis not present

## 2018-04-21 DIAGNOSIS — N400 Enlarged prostate without lower urinary tract symptoms: Secondary | ICD-10-CM | POA: Diagnosis not present

## 2018-04-21 DIAGNOSIS — I10 Essential (primary) hypertension: Secondary | ICD-10-CM | POA: Diagnosis not present

## 2018-04-21 DIAGNOSIS — E1169 Type 2 diabetes mellitus with other specified complication: Secondary | ICD-10-CM | POA: Diagnosis not present

## 2018-04-21 DIAGNOSIS — E782 Mixed hyperlipidemia: Secondary | ICD-10-CM | POA: Diagnosis not present

## 2018-04-28 DIAGNOSIS — L409 Psoriasis, unspecified: Secondary | ICD-10-CM | POA: Diagnosis not present

## 2018-04-28 DIAGNOSIS — L97511 Non-pressure chronic ulcer of other part of right foot limited to breakdown of skin: Secondary | ICD-10-CM | POA: Diagnosis not present

## 2018-04-28 DIAGNOSIS — B351 Tinea unguium: Secondary | ICD-10-CM | POA: Diagnosis not present

## 2018-04-28 DIAGNOSIS — E1140 Type 2 diabetes mellitus with diabetic neuropathy, unspecified: Secondary | ICD-10-CM | POA: Diagnosis not present

## 2018-05-07 DIAGNOSIS — N400 Enlarged prostate without lower urinary tract symptoms: Secondary | ICD-10-CM | POA: Diagnosis not present

## 2018-05-07 DIAGNOSIS — E1169 Type 2 diabetes mellitus with other specified complication: Secondary | ICD-10-CM | POA: Diagnosis not present

## 2018-05-07 DIAGNOSIS — I10 Essential (primary) hypertension: Secondary | ICD-10-CM | POA: Diagnosis not present

## 2018-05-07 DIAGNOSIS — E782 Mixed hyperlipidemia: Secondary | ICD-10-CM | POA: Diagnosis not present

## 2018-05-08 DIAGNOSIS — E782 Mixed hyperlipidemia: Secondary | ICD-10-CM | POA: Diagnosis not present

## 2018-05-08 DIAGNOSIS — R7301 Impaired fasting glucose: Secondary | ICD-10-CM | POA: Diagnosis not present

## 2018-05-08 DIAGNOSIS — E291 Testicular hypofunction: Secondary | ICD-10-CM | POA: Diagnosis not present

## 2018-05-08 DIAGNOSIS — I10 Essential (primary) hypertension: Secondary | ICD-10-CM | POA: Diagnosis not present

## 2018-05-15 DIAGNOSIS — E1169 Type 2 diabetes mellitus with other specified complication: Secondary | ICD-10-CM | POA: Diagnosis not present

## 2018-05-15 DIAGNOSIS — E291 Testicular hypofunction: Secondary | ICD-10-CM | POA: Diagnosis not present

## 2018-05-15 DIAGNOSIS — I10 Essential (primary) hypertension: Secondary | ICD-10-CM | POA: Diagnosis not present

## 2018-05-15 DIAGNOSIS — N400 Enlarged prostate without lower urinary tract symptoms: Secondary | ICD-10-CM | POA: Diagnosis not present

## 2018-05-15 DIAGNOSIS — E870 Hyperosmolality and hypernatremia: Secondary | ICD-10-CM | POA: Diagnosis not present

## 2018-05-15 DIAGNOSIS — H6122 Impacted cerumen, left ear: Secondary | ICD-10-CM | POA: Diagnosis not present

## 2018-05-15 DIAGNOSIS — E782 Mixed hyperlipidemia: Secondary | ICD-10-CM | POA: Diagnosis not present

## 2018-05-16 ENCOUNTER — Other Ambulatory Visit (HOSPITAL_COMMUNITY): Payer: Self-pay | Admitting: Respiratory Therapy

## 2018-05-16 DIAGNOSIS — R0602 Shortness of breath: Secondary | ICD-10-CM

## 2018-06-24 DIAGNOSIS — E1169 Type 2 diabetes mellitus with other specified complication: Secondary | ICD-10-CM | POA: Diagnosis not present

## 2018-06-24 DIAGNOSIS — I10 Essential (primary) hypertension: Secondary | ICD-10-CM | POA: Diagnosis not present

## 2018-06-24 DIAGNOSIS — E782 Mixed hyperlipidemia: Secondary | ICD-10-CM | POA: Diagnosis not present

## 2018-06-24 DIAGNOSIS — N400 Enlarged prostate without lower urinary tract symptoms: Secondary | ICD-10-CM | POA: Diagnosis not present

## 2018-06-26 DIAGNOSIS — R197 Diarrhea, unspecified: Secondary | ICD-10-CM | POA: Diagnosis not present

## 2018-07-07 DIAGNOSIS — B351 Tinea unguium: Secondary | ICD-10-CM | POA: Diagnosis not present

## 2018-07-07 DIAGNOSIS — M79675 Pain in left toe(s): Secondary | ICD-10-CM | POA: Diagnosis not present

## 2018-07-07 DIAGNOSIS — E1140 Type 2 diabetes mellitus with diabetic neuropathy, unspecified: Secondary | ICD-10-CM | POA: Diagnosis not present

## 2018-07-15 ENCOUNTER — Other Ambulatory Visit (HOSPITAL_COMMUNITY): Payer: Self-pay | Admitting: Respiratory Therapy

## 2018-07-18 DIAGNOSIS — Z0000 Encounter for general adult medical examination without abnormal findings: Secondary | ICD-10-CM | POA: Diagnosis not present

## 2018-07-29 DIAGNOSIS — I10 Essential (primary) hypertension: Secondary | ICD-10-CM | POA: Diagnosis not present

## 2018-07-29 DIAGNOSIS — E782 Mixed hyperlipidemia: Secondary | ICD-10-CM | POA: Diagnosis not present

## 2018-07-29 DIAGNOSIS — E1169 Type 2 diabetes mellitus with other specified complication: Secondary | ICD-10-CM | POA: Diagnosis not present

## 2018-07-29 DIAGNOSIS — N400 Enlarged prostate without lower urinary tract symptoms: Secondary | ICD-10-CM | POA: Diagnosis not present

## 2018-08-14 DIAGNOSIS — I10 Essential (primary) hypertension: Secondary | ICD-10-CM | POA: Diagnosis not present

## 2018-08-14 DIAGNOSIS — E1169 Type 2 diabetes mellitus with other specified complication: Secondary | ICD-10-CM | POA: Diagnosis not present

## 2018-08-14 DIAGNOSIS — N400 Enlarged prostate without lower urinary tract symptoms: Secondary | ICD-10-CM | POA: Diagnosis not present

## 2018-08-14 DIAGNOSIS — E782 Mixed hyperlipidemia: Secondary | ICD-10-CM | POA: Diagnosis not present

## 2018-08-27 DIAGNOSIS — R7301 Impaired fasting glucose: Secondary | ICD-10-CM | POA: Diagnosis not present

## 2018-08-27 DIAGNOSIS — E1169 Type 2 diabetes mellitus with other specified complication: Secondary | ICD-10-CM | POA: Diagnosis not present

## 2018-08-27 DIAGNOSIS — E291 Testicular hypofunction: Secondary | ICD-10-CM | POA: Diagnosis not present

## 2018-08-27 DIAGNOSIS — E782 Mixed hyperlipidemia: Secondary | ICD-10-CM | POA: Diagnosis not present

## 2018-08-27 DIAGNOSIS — I10 Essential (primary) hypertension: Secondary | ICD-10-CM | POA: Diagnosis not present

## 2018-09-08 DIAGNOSIS — E291 Testicular hypofunction: Secondary | ICD-10-CM | POA: Diagnosis not present

## 2018-09-08 DIAGNOSIS — E782 Mixed hyperlipidemia: Secondary | ICD-10-CM | POA: Diagnosis not present

## 2018-09-08 DIAGNOSIS — J069 Acute upper respiratory infection, unspecified: Secondary | ICD-10-CM | POA: Diagnosis not present

## 2018-09-08 DIAGNOSIS — N400 Enlarged prostate without lower urinary tract symptoms: Secondary | ICD-10-CM | POA: Diagnosis not present

## 2018-09-08 DIAGNOSIS — I10 Essential (primary) hypertension: Secondary | ICD-10-CM | POA: Diagnosis not present

## 2018-09-08 DIAGNOSIS — E1169 Type 2 diabetes mellitus with other specified complication: Secondary | ICD-10-CM | POA: Diagnosis not present

## 2018-09-15 DIAGNOSIS — E1140 Type 2 diabetes mellitus with diabetic neuropathy, unspecified: Secondary | ICD-10-CM | POA: Diagnosis not present

## 2018-09-15 DIAGNOSIS — L97511 Non-pressure chronic ulcer of other part of right foot limited to breakdown of skin: Secondary | ICD-10-CM | POA: Diagnosis not present

## 2018-09-23 DIAGNOSIS — E1169 Type 2 diabetes mellitus with other specified complication: Secondary | ICD-10-CM | POA: Diagnosis not present

## 2018-09-23 DIAGNOSIS — E782 Mixed hyperlipidemia: Secondary | ICD-10-CM | POA: Diagnosis not present

## 2018-09-23 DIAGNOSIS — N400 Enlarged prostate without lower urinary tract symptoms: Secondary | ICD-10-CM | POA: Diagnosis not present

## 2018-09-23 DIAGNOSIS — I10 Essential (primary) hypertension: Secondary | ICD-10-CM | POA: Diagnosis not present

## 2018-10-08 DIAGNOSIS — N400 Enlarged prostate without lower urinary tract symptoms: Secondary | ICD-10-CM | POA: Diagnosis not present

## 2018-10-08 DIAGNOSIS — I10 Essential (primary) hypertension: Secondary | ICD-10-CM | POA: Diagnosis not present

## 2018-10-08 DIAGNOSIS — E782 Mixed hyperlipidemia: Secondary | ICD-10-CM | POA: Diagnosis not present

## 2018-10-08 DIAGNOSIS — E1169 Type 2 diabetes mellitus with other specified complication: Secondary | ICD-10-CM | POA: Diagnosis not present

## 2018-11-07 DIAGNOSIS — E1169 Type 2 diabetes mellitus with other specified complication: Secondary | ICD-10-CM | POA: Diagnosis not present

## 2018-11-07 DIAGNOSIS — I10 Essential (primary) hypertension: Secondary | ICD-10-CM | POA: Diagnosis not present

## 2018-11-07 DIAGNOSIS — E782 Mixed hyperlipidemia: Secondary | ICD-10-CM | POA: Diagnosis not present

## 2018-11-07 DIAGNOSIS — N400 Enlarged prostate without lower urinary tract symptoms: Secondary | ICD-10-CM | POA: Diagnosis not present

## 2018-11-24 DIAGNOSIS — E1140 Type 2 diabetes mellitus with diabetic neuropathy, unspecified: Secondary | ICD-10-CM | POA: Diagnosis not present

## 2018-11-24 DIAGNOSIS — L97511 Non-pressure chronic ulcer of other part of right foot limited to breakdown of skin: Secondary | ICD-10-CM | POA: Diagnosis not present

## 2018-12-05 DIAGNOSIS — E782 Mixed hyperlipidemia: Secondary | ICD-10-CM | POA: Diagnosis not present

## 2018-12-05 DIAGNOSIS — N400 Enlarged prostate without lower urinary tract symptoms: Secondary | ICD-10-CM | POA: Diagnosis not present

## 2018-12-05 DIAGNOSIS — E1169 Type 2 diabetes mellitus with other specified complication: Secondary | ICD-10-CM | POA: Diagnosis not present

## 2018-12-05 DIAGNOSIS — I10 Essential (primary) hypertension: Secondary | ICD-10-CM | POA: Diagnosis not present

## 2018-12-12 DIAGNOSIS — E291 Testicular hypofunction: Secondary | ICD-10-CM | POA: Diagnosis not present

## 2018-12-12 DIAGNOSIS — Z23 Encounter for immunization: Secondary | ICD-10-CM | POA: Diagnosis not present

## 2018-12-12 DIAGNOSIS — E1169 Type 2 diabetes mellitus with other specified complication: Secondary | ICD-10-CM | POA: Diagnosis not present

## 2018-12-12 DIAGNOSIS — E782 Mixed hyperlipidemia: Secondary | ICD-10-CM | POA: Diagnosis not present

## 2018-12-12 DIAGNOSIS — I10 Essential (primary) hypertension: Secondary | ICD-10-CM | POA: Diagnosis not present

## 2018-12-29 DIAGNOSIS — L97511 Non-pressure chronic ulcer of other part of right foot limited to breakdown of skin: Secondary | ICD-10-CM | POA: Diagnosis not present

## 2018-12-29 DIAGNOSIS — E1140 Type 2 diabetes mellitus with diabetic neuropathy, unspecified: Secondary | ICD-10-CM | POA: Diagnosis not present

## 2019-01-12 DIAGNOSIS — N400 Enlarged prostate without lower urinary tract symptoms: Secondary | ICD-10-CM | POA: Diagnosis not present

## 2019-01-12 DIAGNOSIS — F411 Generalized anxiety disorder: Secondary | ICD-10-CM | POA: Diagnosis not present

## 2019-01-12 DIAGNOSIS — Z712 Person consulting for explanation of examination or test findings: Secondary | ICD-10-CM | POA: Diagnosis not present

## 2019-01-12 DIAGNOSIS — E1169 Type 2 diabetes mellitus with other specified complication: Secondary | ICD-10-CM | POA: Diagnosis not present

## 2019-01-12 DIAGNOSIS — E785 Hyperlipidemia, unspecified: Secondary | ICD-10-CM | POA: Diagnosis not present

## 2019-01-12 DIAGNOSIS — E291 Testicular hypofunction: Secondary | ICD-10-CM | POA: Diagnosis not present

## 2019-01-12 DIAGNOSIS — I10 Essential (primary) hypertension: Secondary | ICD-10-CM | POA: Diagnosis not present

## 2019-03-09 ENCOUNTER — Other Ambulatory Visit: Payer: Self-pay | Admitting: Podiatry

## 2019-03-09 ENCOUNTER — Other Ambulatory Visit (HOSPITAL_COMMUNITY): Payer: Self-pay | Admitting: Podiatry

## 2019-03-09 DIAGNOSIS — E1140 Type 2 diabetes mellitus with diabetic neuropathy, unspecified: Secondary | ICD-10-CM | POA: Diagnosis not present

## 2019-03-09 DIAGNOSIS — B351 Tinea unguium: Secondary | ICD-10-CM | POA: Diagnosis not present

## 2019-03-09 DIAGNOSIS — I739 Peripheral vascular disease, unspecified: Secondary | ICD-10-CM

## 2019-03-11 DIAGNOSIS — E7849 Other hyperlipidemia: Secondary | ICD-10-CM | POA: Diagnosis not present

## 2019-03-11 DIAGNOSIS — Z23 Encounter for immunization: Secondary | ICD-10-CM | POA: Diagnosis not present

## 2019-03-11 DIAGNOSIS — E1169 Type 2 diabetes mellitus with other specified complication: Secondary | ICD-10-CM | POA: Diagnosis not present

## 2019-03-11 DIAGNOSIS — I10 Essential (primary) hypertension: Secondary | ICD-10-CM | POA: Diagnosis not present

## 2019-03-12 ENCOUNTER — Ambulatory Visit (HOSPITAL_COMMUNITY)
Admission: RE | Admit: 2019-03-12 | Discharge: 2019-03-12 | Disposition: A | Discharge status: Home / Self Care | Payer: PPO | Source: Ambulatory Visit | Attending: Podiatry | Admitting: Podiatry

## 2019-03-12 ENCOUNTER — Other Ambulatory Visit: Payer: Self-pay

## 2019-03-12 DIAGNOSIS — M79605 Pain in left leg: Secondary | ICD-10-CM | POA: Diagnosis not present

## 2019-03-12 DIAGNOSIS — I739 Peripheral vascular disease, unspecified: Secondary | ICD-10-CM | POA: Insufficient documentation

## 2019-03-12 DIAGNOSIS — M79604 Pain in right leg: Secondary | ICD-10-CM | POA: Diagnosis not present

## 2019-04-20 DIAGNOSIS — Z6833 Body mass index (BMI) 33.0-33.9, adult: Secondary | ICD-10-CM | POA: Diagnosis not present

## 2019-04-20 DIAGNOSIS — E291 Testicular hypofunction: Secondary | ICD-10-CM | POA: Diagnosis not present

## 2019-04-20 DIAGNOSIS — N62 Hypertrophy of breast: Secondary | ICD-10-CM | POA: Diagnosis not present

## 2019-04-20 DIAGNOSIS — R197 Diarrhea, unspecified: Secondary | ICD-10-CM | POA: Diagnosis not present

## 2019-04-20 DIAGNOSIS — R7301 Impaired fasting glucose: Secondary | ICD-10-CM | POA: Diagnosis not present

## 2019-04-20 DIAGNOSIS — I10 Essential (primary) hypertension: Secondary | ICD-10-CM | POA: Diagnosis not present

## 2019-04-20 DIAGNOSIS — R070 Pain in throat: Secondary | ICD-10-CM | POA: Diagnosis not present

## 2019-04-20 DIAGNOSIS — E870 Hyperosmolality and hypernatremia: Secondary | ICD-10-CM | POA: Diagnosis not present

## 2019-04-20 DIAGNOSIS — H6500 Acute serous otitis media, unspecified ear: Secondary | ICD-10-CM | POA: Diagnosis not present

## 2019-04-20 DIAGNOSIS — Z23 Encounter for immunization: Secondary | ICD-10-CM | POA: Diagnosis not present

## 2019-04-20 DIAGNOSIS — J069 Acute upper respiratory infection, unspecified: Secondary | ICD-10-CM | POA: Diagnosis not present

## 2019-04-27 DIAGNOSIS — F411 Generalized anxiety disorder: Secondary | ICD-10-CM | POA: Diagnosis not present

## 2019-04-27 DIAGNOSIS — E782 Mixed hyperlipidemia: Secondary | ICD-10-CM | POA: Diagnosis not present

## 2019-04-27 DIAGNOSIS — G9009 Other idiopathic peripheral autonomic neuropathy: Secondary | ICD-10-CM | POA: Diagnosis not present

## 2019-04-27 DIAGNOSIS — J449 Chronic obstructive pulmonary disease, unspecified: Secondary | ICD-10-CM | POA: Diagnosis not present

## 2019-04-27 DIAGNOSIS — E291 Testicular hypofunction: Secondary | ICD-10-CM | POA: Diagnosis not present

## 2019-04-27 DIAGNOSIS — I10 Essential (primary) hypertension: Secondary | ICD-10-CM | POA: Diagnosis not present

## 2019-04-27 DIAGNOSIS — N400 Enlarged prostate without lower urinary tract symptoms: Secondary | ICD-10-CM | POA: Diagnosis not present

## 2019-04-27 DIAGNOSIS — Z0001 Encounter for general adult medical examination with abnormal findings: Secondary | ICD-10-CM | POA: Diagnosis not present

## 2019-04-27 DIAGNOSIS — E1169 Type 2 diabetes mellitus with other specified complication: Secondary | ICD-10-CM | POA: Diagnosis not present

## 2019-05-05 IMAGING — CT CT CHEST W/O CM
2 of 3 series · 15 of 36 positions shown, 18 images · non-contrast
Comparison: None.

CLINICAL DATA: Altered mental status with dyspnea. Unresolved
pneumonia.

EXAM:
CT CHEST WITHOUT CONTRAST
TECHNIQUE: Multidetector CT imaging of the chest was performed following the
standard protocol without IV contrast.

[Series 2: thorax · axial · 0.87mm/px · z∈[+1052,+1334]mm · 12 of 167 slices shown, 15 images]
[im 13/167  mediastinal]
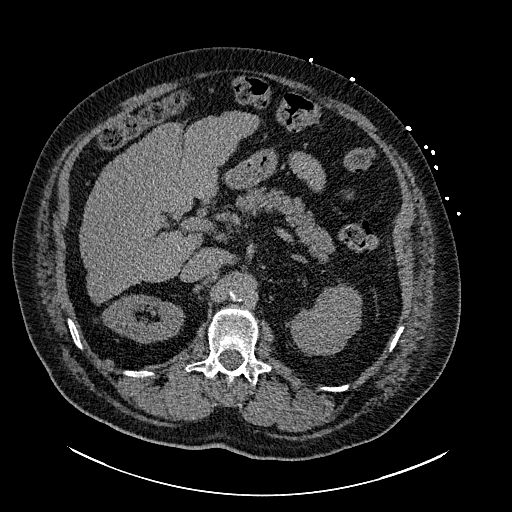
[im 13/167  lung]
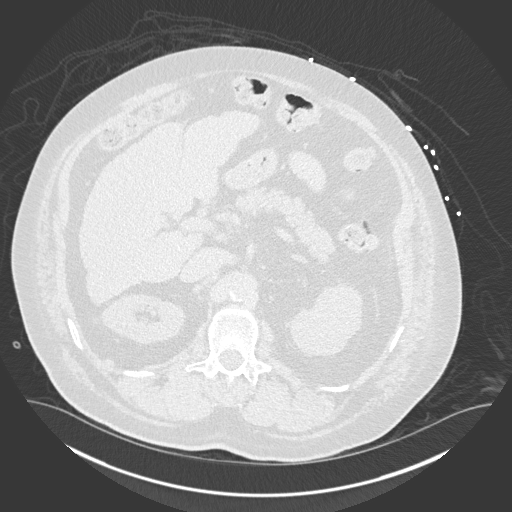
[im 25/167  lung]
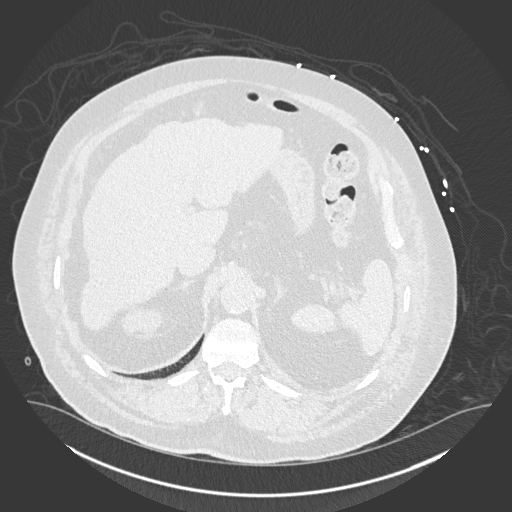
[im 37/167  lung]
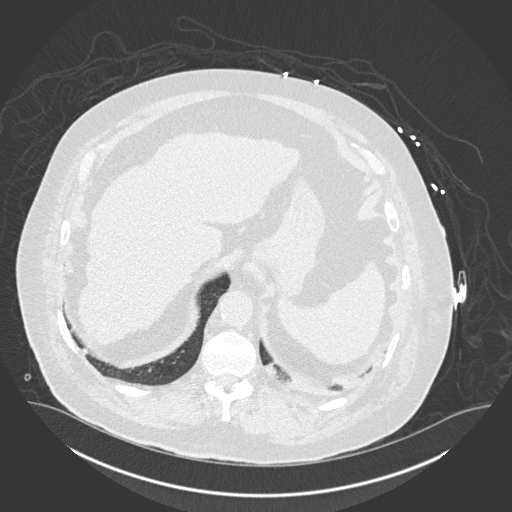
[im 50/167  lung]
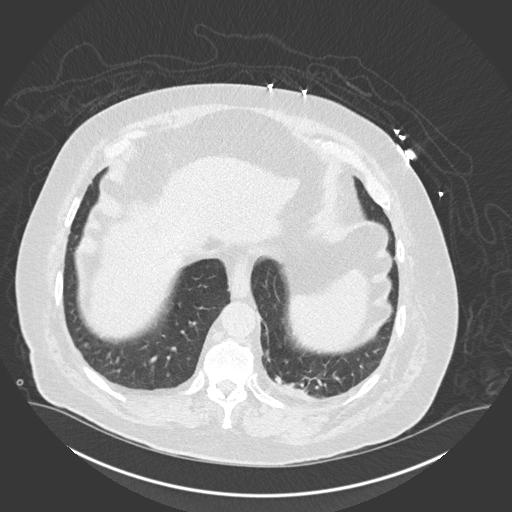
[im 62/167  mediastinal]
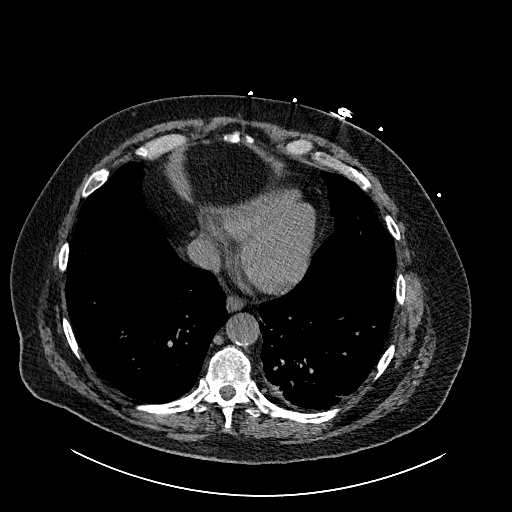
[im 62/167  lung]
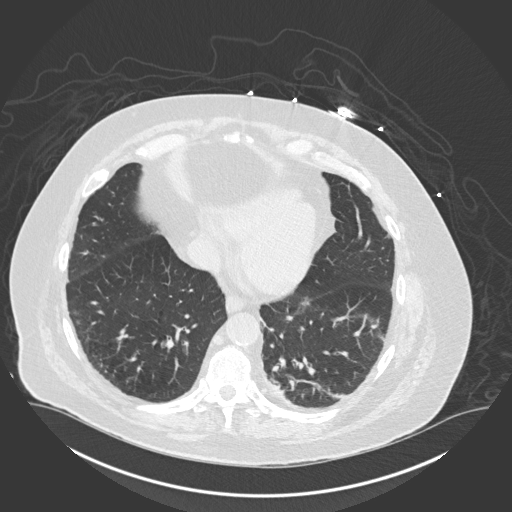
[im 74/167  lung]
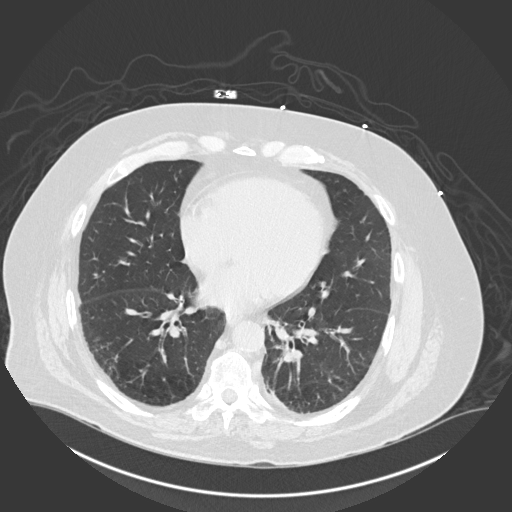
[im 93/167  lung]
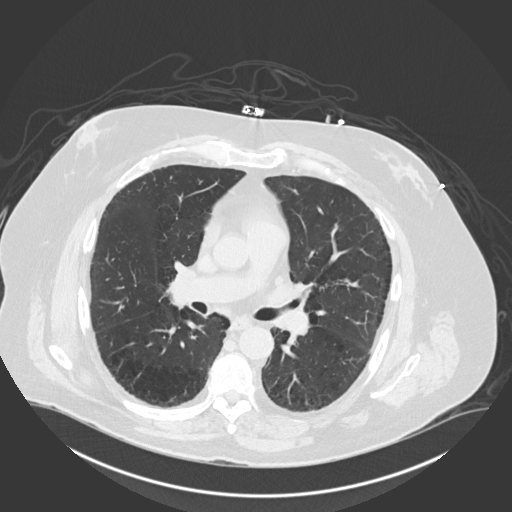
[im 105/167  lung]
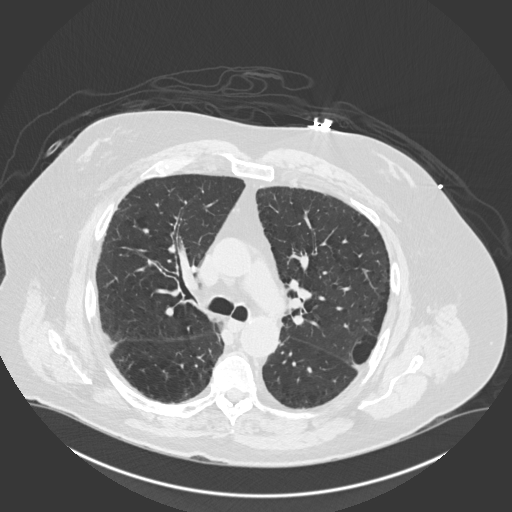
[im 117/167  mediastinal]
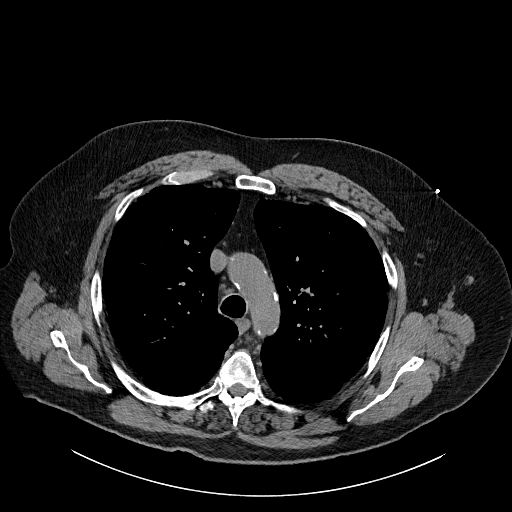
[im 117/167  lung]
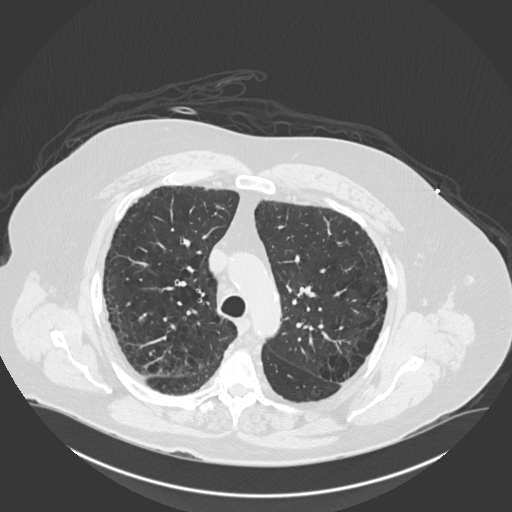
[im 130/167  lung]
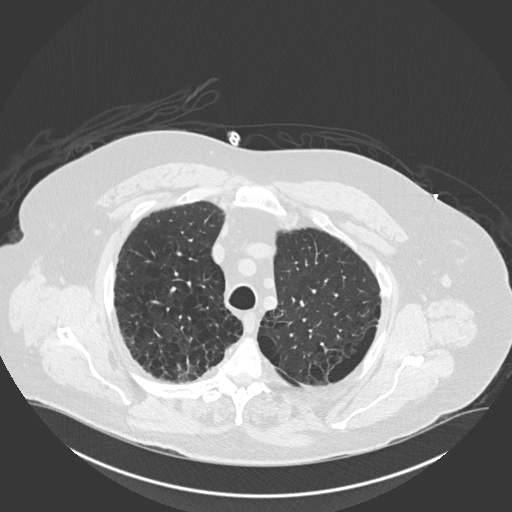
[im 142/167  lung]
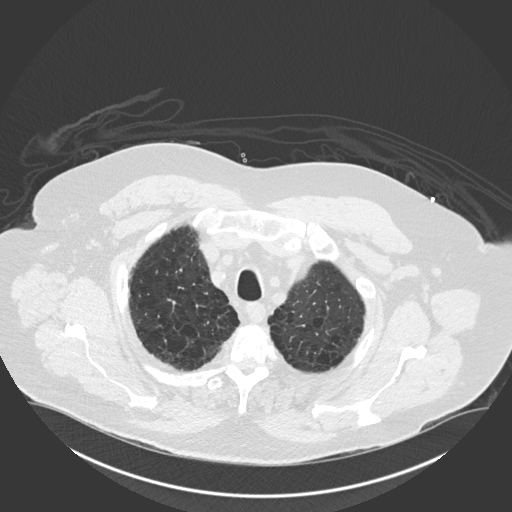
[im 154/167  lung]
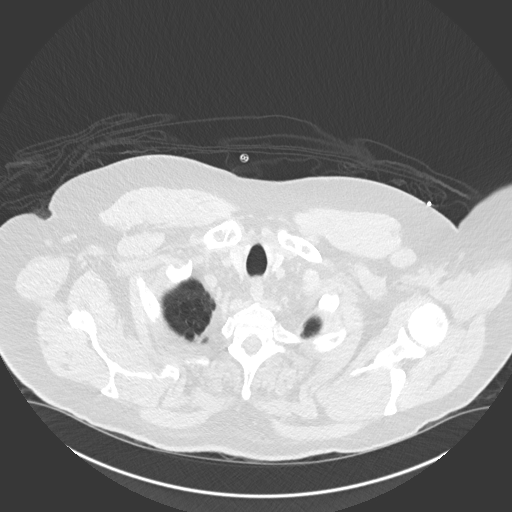

[Series 5: coronal · coronal · 0.73mm/px · 3 of 183 slices shown]
[im 37/183  lung]
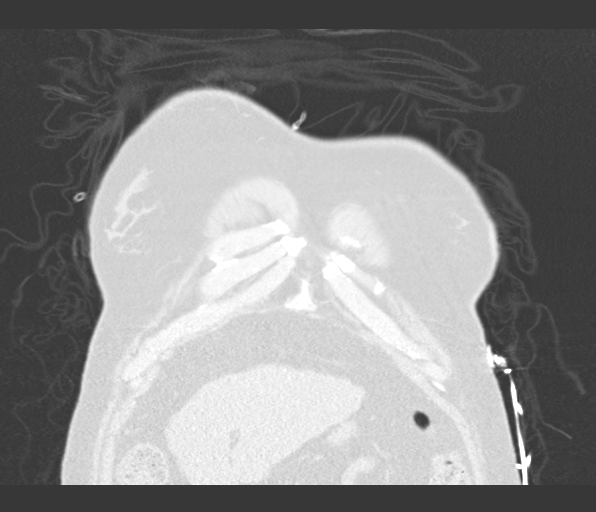
[im 73/183  lung]
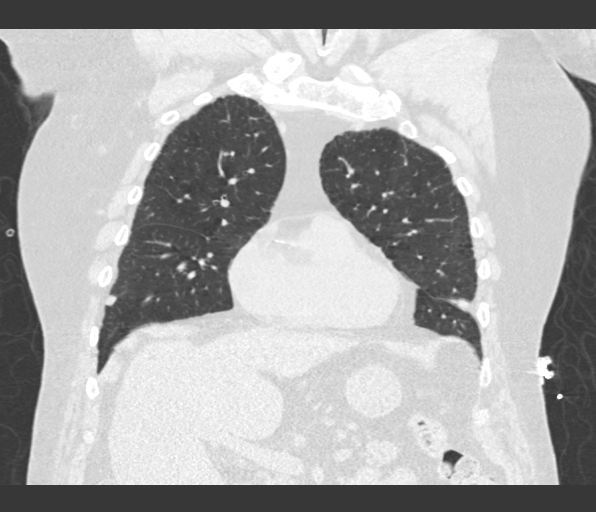
[im 110/183  lung]
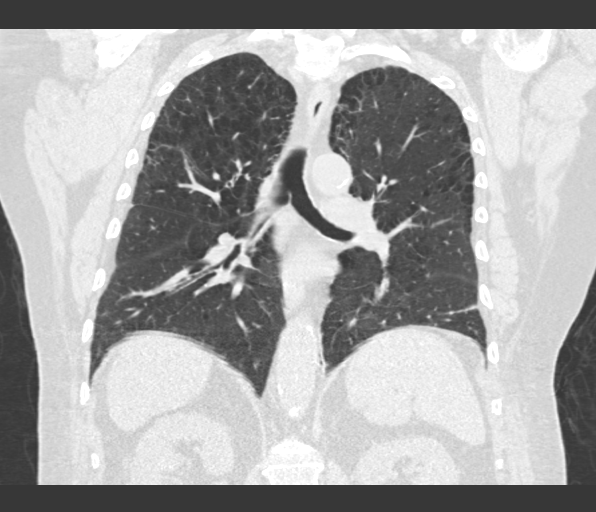

[15 of 36 positions shown; findings below may reference images not displayed]

FINDINGS: Cardiovascular: Heart is top normal without pericardial effusion.
Three-vessel coronary arteriosclerosis including the left main are
noted. No aortic aneurysm. There is mild-to-moderate aortic
atherosclerosis. Mild prominence of the central pulmonary
vasculature likely reflects chronic pulmonary hypertension.

Mediastinum/Nodes: No enlarged mediastinal or axillary lymph nodes.
Thyroid gland, trachea, and esophagus demonstrate no significant
findings.

Lungs/Pleura: Centrilobular and paraseptal emphysema with lingular
sub pleural atelectasis noted. Stable pleural-based right lower lobe
nodular density is stable, image 101/4 with rounded 4 mm nodule in
the superior segment of the left lower lobe image 73/4. Additional
right upper and left lower lobe nodules previously described are
less apparent and may have were selected inflammatory nodules. No
new pulmonary consolidation, effusion or pneumothorax.

Upper Abdomen: Nodular liver surface morphologically consistent with
cirrhosis. No acute abdominal abnormality.

Musculoskeletal: No chest wall mass or suspicious bone lesions
identified.
IMPRESSION: 1. Central and paraseptal emphysema.
2. Scarring and/or atelectasis the lingula. No new pulmonary
consolidation, effusion or pneumothorax.
3. Stable nodular densities in the lower lobes. Given stability
since 02/07/2011 CT can these are likely to represent benign
findings. Apparent resolution of right upper lobe nodular densities
that may have represented inflammatory nodules.
4. Cirrhotic appearance of the liver.
5. Aortic atherosclerosis and coronary arteriosclerosis.

Aortic Atherosclerosis (VDIRW-04B.B) and Emphysema (VDIRW-0E8.Q).

## 2019-05-18 DIAGNOSIS — E1140 Type 2 diabetes mellitus with diabetic neuropathy, unspecified: Secondary | ICD-10-CM | POA: Diagnosis not present

## 2019-05-18 DIAGNOSIS — B351 Tinea unguium: Secondary | ICD-10-CM | POA: Diagnosis not present

## 2019-05-28 DIAGNOSIS — I10 Essential (primary) hypertension: Secondary | ICD-10-CM | POA: Diagnosis not present

## 2019-05-28 DIAGNOSIS — E1169 Type 2 diabetes mellitus with other specified complication: Secondary | ICD-10-CM | POA: Diagnosis not present

## 2019-05-28 DIAGNOSIS — N400 Enlarged prostate without lower urinary tract symptoms: Secondary | ICD-10-CM | POA: Diagnosis not present

## 2019-05-28 DIAGNOSIS — E782 Mixed hyperlipidemia: Secondary | ICD-10-CM | POA: Diagnosis not present

## 2019-07-06 DIAGNOSIS — I10 Essential (primary) hypertension: Secondary | ICD-10-CM | POA: Diagnosis not present

## 2019-07-06 DIAGNOSIS — E1169 Type 2 diabetes mellitus with other specified complication: Secondary | ICD-10-CM | POA: Diagnosis not present

## 2019-07-06 DIAGNOSIS — N400 Enlarged prostate without lower urinary tract symptoms: Secondary | ICD-10-CM | POA: Diagnosis not present

## 2019-07-06 DIAGNOSIS — E782 Mixed hyperlipidemia: Secondary | ICD-10-CM | POA: Diagnosis not present

## 2019-08-04 DIAGNOSIS — N400 Enlarged prostate without lower urinary tract symptoms: Secondary | ICD-10-CM | POA: Diagnosis not present

## 2019-08-04 DIAGNOSIS — I10 Essential (primary) hypertension: Secondary | ICD-10-CM | POA: Diagnosis not present

## 2019-08-04 DIAGNOSIS — E782 Mixed hyperlipidemia: Secondary | ICD-10-CM | POA: Diagnosis not present

## 2019-08-04 DIAGNOSIS — E1169 Type 2 diabetes mellitus with other specified complication: Secondary | ICD-10-CM | POA: Diagnosis not present

## 2019-08-10 DIAGNOSIS — E1169 Type 2 diabetes mellitus with other specified complication: Secondary | ICD-10-CM | POA: Diagnosis not present

## 2019-08-10 DIAGNOSIS — G9009 Other idiopathic peripheral autonomic neuropathy: Secondary | ICD-10-CM | POA: Diagnosis not present

## 2019-08-10 DIAGNOSIS — B370 Candidal stomatitis: Secondary | ICD-10-CM | POA: Diagnosis not present

## 2019-08-10 DIAGNOSIS — E7849 Other hyperlipidemia: Secondary | ICD-10-CM | POA: Diagnosis not present

## 2019-08-10 DIAGNOSIS — E782 Mixed hyperlipidemia: Secondary | ICD-10-CM | POA: Diagnosis not present

## 2019-08-10 DIAGNOSIS — F419 Anxiety disorder, unspecified: Secondary | ICD-10-CM | POA: Diagnosis not present

## 2019-08-10 DIAGNOSIS — H6122 Impacted cerumen, left ear: Secondary | ICD-10-CM | POA: Diagnosis not present

## 2019-08-10 DIAGNOSIS — E870 Hyperosmolality and hypernatremia: Secondary | ICD-10-CM | POA: Diagnosis not present

## 2019-08-10 DIAGNOSIS — E291 Testicular hypofunction: Secondary | ICD-10-CM | POA: Diagnosis not present

## 2019-08-10 DIAGNOSIS — F411 Generalized anxiety disorder: Secondary | ICD-10-CM | POA: Diagnosis not present

## 2019-08-10 DIAGNOSIS — E785 Hyperlipidemia, unspecified: Secondary | ICD-10-CM | POA: Diagnosis not present

## 2019-08-17 DIAGNOSIS — J449 Chronic obstructive pulmonary disease, unspecified: Secondary | ICD-10-CM | POA: Diagnosis not present

## 2019-08-17 DIAGNOSIS — E291 Testicular hypofunction: Secondary | ICD-10-CM | POA: Diagnosis not present

## 2019-08-17 DIAGNOSIS — N390 Urinary tract infection, site not specified: Secondary | ICD-10-CM | POA: Diagnosis not present

## 2019-08-17 DIAGNOSIS — E782 Mixed hyperlipidemia: Secondary | ICD-10-CM | POA: Diagnosis not present

## 2019-08-17 DIAGNOSIS — G9009 Other idiopathic peripheral autonomic neuropathy: Secondary | ICD-10-CM | POA: Diagnosis not present

## 2019-08-17 DIAGNOSIS — D72829 Elevated white blood cell count, unspecified: Secondary | ICD-10-CM | POA: Diagnosis not present

## 2019-08-17 DIAGNOSIS — E1169 Type 2 diabetes mellitus with other specified complication: Secondary | ICD-10-CM | POA: Diagnosis not present

## 2019-08-17 DIAGNOSIS — I10 Essential (primary) hypertension: Secondary | ICD-10-CM | POA: Diagnosis not present

## 2019-08-17 DIAGNOSIS — N400 Enlarged prostate without lower urinary tract symptoms: Secondary | ICD-10-CM | POA: Diagnosis not present

## 2019-09-02 ENCOUNTER — Emergency Department (HOSPITAL_COMMUNITY): Payer: PPO

## 2019-09-02 ENCOUNTER — Encounter (HOSPITAL_COMMUNITY): Payer: Self-pay

## 2019-09-02 ENCOUNTER — Emergency Department (HOSPITAL_COMMUNITY)
Admission: EM | Admit: 2019-09-02 | Discharge: 2019-09-02 | Disposition: A | Discharge status: Home / Self Care | Payer: PPO | Attending: Emergency Medicine | Admitting: Emergency Medicine

## 2019-09-02 ENCOUNTER — Other Ambulatory Visit: Payer: Self-pay

## 2019-09-02 DIAGNOSIS — R58 Hemorrhage, not elsewhere classified: Secondary | ICD-10-CM | POA: Diagnosis not present

## 2019-09-02 DIAGNOSIS — S3993XA Unspecified injury of pelvis, initial encounter: Secondary | ICD-10-CM | POA: Diagnosis not present

## 2019-09-02 DIAGNOSIS — J441 Chronic obstructive pulmonary disease with (acute) exacerbation: Secondary | ICD-10-CM | POA: Diagnosis not present

## 2019-09-02 DIAGNOSIS — G319 Degenerative disease of nervous system, unspecified: Secondary | ICD-10-CM | POA: Diagnosis not present

## 2019-09-02 DIAGNOSIS — Y939 Activity, unspecified: Secondary | ICD-10-CM | POA: Diagnosis not present

## 2019-09-02 DIAGNOSIS — Z87891 Personal history of nicotine dependence: Secondary | ICD-10-CM | POA: Diagnosis not present

## 2019-09-02 DIAGNOSIS — Y999 Unspecified external cause status: Secondary | ICD-10-CM | POA: Insufficient documentation

## 2019-09-02 DIAGNOSIS — J341 Cyst and mucocele of nose and nasal sinus: Secondary | ICD-10-CM | POA: Diagnosis not present

## 2019-09-02 DIAGNOSIS — Z79899 Other long term (current) drug therapy: Secondary | ICD-10-CM | POA: Insufficient documentation

## 2019-09-02 DIAGNOSIS — W01198A Fall on same level from slipping, tripping and stumbling with subsequent striking against other object, initial encounter: Secondary | ICD-10-CM | POA: Diagnosis not present

## 2019-09-02 DIAGNOSIS — S0181XA Laceration without foreign body of other part of head, initial encounter: Secondary | ICD-10-CM | POA: Diagnosis not present

## 2019-09-02 DIAGNOSIS — J90 Pleural effusion, not elsewhere classified: Secondary | ICD-10-CM | POA: Diagnosis not present

## 2019-09-02 DIAGNOSIS — Y929 Unspecified place or not applicable: Secondary | ICD-10-CM | POA: Diagnosis not present

## 2019-09-02 DIAGNOSIS — G9389 Other specified disorders of brain: Secondary | ICD-10-CM | POA: Diagnosis not present

## 2019-09-02 DIAGNOSIS — S0101XA Laceration without foreign body of scalp, initial encounter: Secondary | ICD-10-CM | POA: Diagnosis not present

## 2019-09-02 DIAGNOSIS — W19XXXA Unspecified fall, initial encounter: Secondary | ICD-10-CM

## 2019-09-02 DIAGNOSIS — R52 Pain, unspecified: Secondary | ICD-10-CM | POA: Diagnosis not present

## 2019-09-02 DIAGNOSIS — R42 Dizziness and giddiness: Secondary | ICD-10-CM | POA: Insufficient documentation

## 2019-09-02 DIAGNOSIS — Z7982 Long term (current) use of aspirin: Secondary | ICD-10-CM | POA: Diagnosis not present

## 2019-09-02 LAB — COMPREHENSIVE METABOLIC PANEL
ALT: 24 U/L (ref 0–44)
AST: 24 U/L (ref 15–41)
Albumin: 3.5 g/dL (ref 3.5–5.0)
Alkaline Phosphatase: 44 U/L (ref 38–126)
Anion gap: 10 (ref 5–15)
BUN: 11 mg/dL (ref 8–23)
CO2: 29 mmol/L (ref 22–32)
Calcium: 8.1 mg/dL — ABNORMAL LOW (ref 8.9–10.3)
Chloride: 99 mmol/L (ref 98–111)
Creatinine, Ser: 0.79 mg/dL (ref 0.61–1.24)
GFR calc Af Amer: >60 mL/min (ref >60)
GFR calc non Af Amer: >60 mL/min (ref >60)
Glucose, Bld: 98 mg/dL (ref 70–99)
Potassium: 4 mmol/L (ref 3.5–5.1)
Sodium: 138 mmol/L (ref 135–145)
Total Bilirubin: 1.3 mg/dL — ABNORMAL HIGH (ref 0.3–1.2)
Total Protein: 7.6 g/dL (ref 6.5–8.1)

## 2019-09-02 LAB — CBC WITH DIFFERENTIAL/PLATELET
Abs Immature Granulocytes: 0.04 10*3/uL (ref 0.00–0.07)
Basophils Absolute: 0.1 10*3/uL (ref 0.0–0.1)
Basophils Relative: 1 %
Eosinophils Absolute: 0.5 10*3/uL (ref 0.0–0.5)
Eosinophils Relative: 4 %
HCT: 42.4 % (ref 39.0–52.0)
Hemoglobin: 13.7 g/dL (ref 13.0–17.0)
Immature Granulocytes: 0 %
Lymphocytes Relative: 17 %
Lymphs Abs: 2 10*3/uL (ref 0.7–4.0)
MCH: 33.2 pg (ref 26.0–34.0)
MCHC: 32.3 g/dL (ref 30.0–36.0)
MCV: 102.7 fL — ABNORMAL HIGH (ref 80.0–100.0)
Monocytes Absolute: 0.6 10*3/uL (ref 0.1–1.0)
Monocytes Relative: 5 %
Neutro Abs: 8.6 10*3/uL — ABNORMAL HIGH (ref 1.7–7.7)
Neutrophils Relative %: 73 %
Platelets: 228 10*3/uL (ref 150–400)
RBC: 4.13 MIL/uL — ABNORMAL LOW (ref 4.22–5.81)
RDW: 14.2 % (ref 11.5–15.5)
WBC: 11.9 10*3/uL — ABNORMAL HIGH (ref 4.0–10.5)
nRBC: 0 % (ref 0.0–0.2)

## 2019-09-02 MED ORDER — SODIUM CHLORIDE 0.9 % IV BOLUS
1000.0000 mL | Freq: Once | INTRAVENOUS | Status: AC
  Administered 2019-09-02: 16:00:00 1000 mL via INTRAVENOUS

## 2019-09-02 MED ORDER — LIDOCAINE-EPINEPHRINE (PF) 2 %-1:200000 IJ SOLN
10.0000 mL | Freq: Once | INTRAMUSCULAR | Status: AC
  Administered 2019-09-02: 10 mL
  Filled 2019-09-02: qty 10

## 2019-09-02 MED ORDER — TETANUS-DIPHTH-ACELL PERTUSSIS 5-2.5-18.5 LF-MCG/0.5 IM SUSP
0.5000 mL | Freq: Once | INTRAMUSCULAR | Status: AC
  Administered 2019-09-02: 16:00:00 0.5 mL via INTRAMUSCULAR
  Filled 2019-09-02: qty 0.5

## 2019-09-02 MED ORDER — TRANEXAMIC ACID FOR EPISTAXIS
500.0000 mg | Freq: Once | TOPICAL | Status: AC
  Administered 2019-09-02: 500 mg via TOPICAL
  Filled 2019-09-02: qty 10

## 2019-09-02 NOTE — ED Triage Notes (Signed)
Pt brought to ED via [facility] following fall. Pt with laceration to head. Pt states he stood up and fell and hit the coffee table. Pt states he is unsure if he got dizzy or if he tripped when he stood up. Pt denies LOC. Pt denies blood thinners.

## 2019-09-02 NOTE — ED Notes (Signed)
Pt ambulated to BR to change and back to room without difficultly.

## 2019-09-02 NOTE — ED Provider Notes (Signed)
I was asked to suture forehead laceration for this patient.  It had a small arterial bleed which was partially controlled after using lidocaine/epi for anesthetic, additional control obtained using topical TXA.  LACERATION REPAIR Performed by: Burgess Amor Authorized by: Burgess Amor Consent: Verbal consent obtained. Risks and benefits: risks, benefits and alternatives were discussed Consent given by: patient Patient identity confirmed: provided demographic data Prepped and Draped in normal sterile fashion Wound explored  Laceration Location: forehead  Laceration Length: 5 cm  No Foreign Bodies seen or palpated  Anesthesia: local infiltration  Local anesthetic: lidocaine 2% with epinephrine  Anesthetic total: 4 ml  Irrigation method: syringe Amount of cleaning: standard  Skin closure: ethilon 5-0  Number of sutures: 11  Technique: simple interupted  Patient tolerance: Patient tolerated the procedure well with no immediate complications.    Burgess Amor, PA-C 09/02/19 1721    Jacalyn Lefevre, MD 09/02/19 1725

## 2019-09-02 NOTE — ED Notes (Signed)
Pt sitting up in the chair.
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
## 2019-09-03 DIAGNOSIS — I10 Essential (primary) hypertension: Secondary | ICD-10-CM | POA: Diagnosis not present

## 2019-09-03 DIAGNOSIS — E1169 Type 2 diabetes mellitus with other specified complication: Secondary | ICD-10-CM | POA: Diagnosis not present

## 2019-09-03 DIAGNOSIS — N400 Enlarged prostate without lower urinary tract symptoms: Secondary | ICD-10-CM | POA: Diagnosis not present

## 2019-09-03 DIAGNOSIS — E782 Mixed hyperlipidemia: Secondary | ICD-10-CM | POA: Diagnosis not present

## 2019-09-09 ENCOUNTER — Emergency Department (HOSPITAL_COMMUNITY)
Admission: EM | Admit: 2019-09-09 | Discharge: 2019-09-09 | Disposition: A | Discharge status: Home / Self Care | Payer: PPO | Attending: Emergency Medicine | Admitting: Emergency Medicine

## 2019-09-09 ENCOUNTER — Encounter (HOSPITAL_COMMUNITY): Payer: Self-pay | Admitting: Emergency Medicine

## 2019-09-09 ENCOUNTER — Other Ambulatory Visit: Payer: Self-pay

## 2019-09-09 DIAGNOSIS — J441 Chronic obstructive pulmonary disease with (acute) exacerbation: Secondary | ICD-10-CM | POA: Diagnosis not present

## 2019-09-09 DIAGNOSIS — S0990XD Unspecified injury of head, subsequent encounter: Secondary | ICD-10-CM | POA: Diagnosis present

## 2019-09-09 DIAGNOSIS — Z87891 Personal history of nicotine dependence: Secondary | ICD-10-CM | POA: Diagnosis not present

## 2019-09-09 DIAGNOSIS — S0191XD Laceration without foreign body of unspecified part of head, subsequent encounter: Secondary | ICD-10-CM | POA: Diagnosis not present

## 2019-09-09 DIAGNOSIS — Z7982 Long term (current) use of aspirin: Secondary | ICD-10-CM | POA: Insufficient documentation

## 2019-09-09 DIAGNOSIS — W19XXXD Unspecified fall, subsequent encounter: Secondary | ICD-10-CM | POA: Insufficient documentation

## 2019-09-09 DIAGNOSIS — Z4802 Encounter for removal of sutures: Secondary | ICD-10-CM | POA: Diagnosis not present

## 2019-09-09 NOTE — Discharge Instructions (Signed)
You were seen here for suture removal.  Please allow the scabs to fall off on their own.  There is no need to scrub the wound or apply soap to the area.  Allow clean water to run over the area when you take a shower and pat dry.   I want to come back to emergency department if you develop fever, chills, discharge or pus from the wound, shortness of breath, chest pain, nausea, vomiting, diarrhea as he symptoms require further evaluation and management.
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
## 2019-09-09 NOTE — ED Triage Notes (Signed)
Pt reports had stitches placed on forehead x1 week ago and is here to have them removed after a fall. Pt reports continued chest discomfort. 4/10

## 2019-10-05 DIAGNOSIS — E782 Mixed hyperlipidemia: Secondary | ICD-10-CM | POA: Diagnosis not present

## 2019-10-05 DIAGNOSIS — E1169 Type 2 diabetes mellitus with other specified complication: Secondary | ICD-10-CM | POA: Diagnosis not present

## 2019-10-05 DIAGNOSIS — N400 Enlarged prostate without lower urinary tract symptoms: Secondary | ICD-10-CM | POA: Diagnosis not present

## 2019-10-05 DIAGNOSIS — I10 Essential (primary) hypertension: Secondary | ICD-10-CM | POA: Diagnosis not present

## 2019-10-26 DIAGNOSIS — N400 Enlarged prostate without lower urinary tract symptoms: Secondary | ICD-10-CM | POA: Diagnosis not present

## 2019-10-26 DIAGNOSIS — I10 Essential (primary) hypertension: Secondary | ICD-10-CM | POA: Diagnosis not present

## 2019-10-26 DIAGNOSIS — D72829 Elevated white blood cell count, unspecified: Secondary | ICD-10-CM | POA: Diagnosis not present

## 2019-10-26 DIAGNOSIS — E1169 Type 2 diabetes mellitus with other specified complication: Secondary | ICD-10-CM | POA: Diagnosis not present

## 2019-10-26 DIAGNOSIS — E782 Mixed hyperlipidemia: Secondary | ICD-10-CM | POA: Diagnosis not present

## 2019-10-26 DIAGNOSIS — J449 Chronic obstructive pulmonary disease, unspecified: Secondary | ICD-10-CM | POA: Diagnosis not present

## 2019-10-26 DIAGNOSIS — E291 Testicular hypofunction: Secondary | ICD-10-CM | POA: Diagnosis not present

## 2019-10-26 DIAGNOSIS — G9009 Other idiopathic peripheral autonomic neuropathy: Secondary | ICD-10-CM | POA: Diagnosis not present

## 2019-11-02 DIAGNOSIS — B351 Tinea unguium: Secondary | ICD-10-CM | POA: Diagnosis not present

## 2019-11-02 DIAGNOSIS — S91105A Unspecified open wound of left lesser toe(s) without damage to nail, initial encounter: Secondary | ICD-10-CM | POA: Diagnosis not present

## 2019-11-02 DIAGNOSIS — E1140 Type 2 diabetes mellitus with diabetic neuropathy, unspecified: Secondary | ICD-10-CM | POA: Diagnosis not present

## 2019-11-23 DIAGNOSIS — E782 Mixed hyperlipidemia: Secondary | ICD-10-CM | POA: Diagnosis not present

## 2019-11-23 DIAGNOSIS — B370 Candidal stomatitis: Secondary | ICD-10-CM | POA: Diagnosis not present

## 2019-11-23 DIAGNOSIS — E785 Hyperlipidemia, unspecified: Secondary | ICD-10-CM | POA: Diagnosis not present

## 2019-11-23 DIAGNOSIS — R070 Pain in throat: Secondary | ICD-10-CM | POA: Diagnosis not present

## 2019-11-23 DIAGNOSIS — H6500 Acute serous otitis media, unspecified ear: Secondary | ICD-10-CM | POA: Diagnosis not present

## 2019-11-23 DIAGNOSIS — Z0001 Encounter for general adult medical examination with abnormal findings: Secondary | ICD-10-CM | POA: Diagnosis not present

## 2019-11-23 DIAGNOSIS — G9009 Other idiopathic peripheral autonomic neuropathy: Secondary | ICD-10-CM | POA: Diagnosis not present

## 2019-11-23 DIAGNOSIS — F411 Generalized anxiety disorder: Secondary | ICD-10-CM | POA: Diagnosis not present

## 2019-11-23 DIAGNOSIS — I10 Essential (primary) hypertension: Secondary | ICD-10-CM | POA: Diagnosis not present

## 2019-11-23 DIAGNOSIS — J449 Chronic obstructive pulmonary disease, unspecified: Secondary | ICD-10-CM | POA: Diagnosis not present

## 2019-11-23 DIAGNOSIS — Z23 Encounter for immunization: Secondary | ICD-10-CM | POA: Diagnosis not present

## 2019-11-23 DIAGNOSIS — Z79899 Other long term (current) drug therapy: Secondary | ICD-10-CM | POA: Diagnosis not present

## 2019-11-30 DIAGNOSIS — I10 Essential (primary) hypertension: Secondary | ICD-10-CM | POA: Diagnosis not present

## 2019-11-30 DIAGNOSIS — J449 Chronic obstructive pulmonary disease, unspecified: Secondary | ICD-10-CM | POA: Diagnosis not present

## 2019-11-30 DIAGNOSIS — E782 Mixed hyperlipidemia: Secondary | ICD-10-CM | POA: Diagnosis not present

## 2019-11-30 DIAGNOSIS — F411 Generalized anxiety disorder: Secondary | ICD-10-CM | POA: Diagnosis not present

## 2019-11-30 DIAGNOSIS — N400 Enlarged prostate without lower urinary tract symptoms: Secondary | ICD-10-CM | POA: Diagnosis not present

## 2019-11-30 DIAGNOSIS — E1169 Type 2 diabetes mellitus with other specified complication: Secondary | ICD-10-CM | POA: Diagnosis not present

## 2019-11-30 DIAGNOSIS — D72829 Elevated white blood cell count, unspecified: Secondary | ICD-10-CM | POA: Diagnosis not present

## 2019-11-30 DIAGNOSIS — E291 Testicular hypofunction: Secondary | ICD-10-CM | POA: Diagnosis not present

## 2019-11-30 DIAGNOSIS — G9009 Other idiopathic peripheral autonomic neuropathy: Secondary | ICD-10-CM | POA: Diagnosis not present

## 2019-12-03 DIAGNOSIS — E782 Mixed hyperlipidemia: Secondary | ICD-10-CM | POA: Diagnosis not present

## 2019-12-03 DIAGNOSIS — E1169 Type 2 diabetes mellitus with other specified complication: Secondary | ICD-10-CM | POA: Diagnosis not present

## 2019-12-03 DIAGNOSIS — I10 Essential (primary) hypertension: Secondary | ICD-10-CM | POA: Diagnosis not present

## 2019-12-03 DIAGNOSIS — N400 Enlarged prostate without lower urinary tract symptoms: Secondary | ICD-10-CM | POA: Diagnosis not present

## 2019-12-21 ENCOUNTER — Inpatient Hospital Stay (HOSPITAL_COMMUNITY): Payer: PPO | Attending: Hematology | Admitting: Hematology

## 2019-12-21 ENCOUNTER — Encounter (HOSPITAL_COMMUNITY): Payer: Self-pay | Admitting: Hematology

## 2019-12-21 ENCOUNTER — Other Ambulatory Visit: Payer: Self-pay

## 2019-12-21 ENCOUNTER — Inpatient Hospital Stay (HOSPITAL_COMMUNITY): Payer: PPO

## 2019-12-21 VITALS — BP 158/97 | HR 86 | Temp 97.8°F | Resp 18 | Ht 68.0 in | Wt 242.5 lb

## 2019-12-21 DIAGNOSIS — E1140 Type 2 diabetes mellitus with diabetic neuropathy, unspecified: Secondary | ICD-10-CM | POA: Diagnosis not present

## 2019-12-21 DIAGNOSIS — J449 Chronic obstructive pulmonary disease, unspecified: Secondary | ICD-10-CM | POA: Diagnosis not present

## 2019-12-21 DIAGNOSIS — D72825 Bandemia: Secondary | ICD-10-CM

## 2019-12-21 DIAGNOSIS — I10 Essential (primary) hypertension: Secondary | ICD-10-CM | POA: Insufficient documentation

## 2019-12-21 DIAGNOSIS — Z86718 Personal history of other venous thrombosis and embolism: Secondary | ICD-10-CM | POA: Diagnosis not present

## 2019-12-21 DIAGNOSIS — Z23 Encounter for immunization: Secondary | ICD-10-CM | POA: Diagnosis not present

## 2019-12-21 DIAGNOSIS — D72829 Elevated white blood cell count, unspecified: Secondary | ICD-10-CM | POA: Diagnosis not present

## 2019-12-21 DIAGNOSIS — R634 Abnormal weight loss: Secondary | ICD-10-CM

## 2019-12-21 DIAGNOSIS — N400 Enlarged prostate without lower urinary tract symptoms: Secondary | ICD-10-CM | POA: Diagnosis not present

## 2019-12-21 DIAGNOSIS — Z6834 Body mass index (BMI) 34.0-34.9, adult: Secondary | ICD-10-CM | POA: Diagnosis not present

## 2019-12-21 DIAGNOSIS — Z832 Family history of diseases of the blood and blood-forming organs and certain disorders involving the immune mechanism: Secondary | ICD-10-CM | POA: Diagnosis not present

## 2019-12-21 DIAGNOSIS — Z87891 Personal history of nicotine dependence: Secondary | ICD-10-CM | POA: Diagnosis not present

## 2019-12-21 DIAGNOSIS — Z79890 Hormone replacement therapy (postmenopausal): Secondary | ICD-10-CM | POA: Insufficient documentation

## 2019-12-21 DIAGNOSIS — D649 Anemia, unspecified: Secondary | ICD-10-CM | POA: Diagnosis not present

## 2019-12-21 DIAGNOSIS — E785 Hyperlipidemia, unspecified: Secondary | ICD-10-CM | POA: Diagnosis not present

## 2019-12-21 LAB — CBC WITH DIFFERENTIAL/PLATELET
Abs Immature Granulocytes: 0.02 10*3/uL (ref 0.00–0.07)
Basophils Absolute: 0.1 10*3/uL (ref 0.0–0.1)
Basophils Relative: 1 %
Eosinophils Absolute: 0.5 10*3/uL (ref 0.0–0.5)
Eosinophils Relative: 5 %
HCT: 45 % (ref 39.0–52.0)
Hemoglobin: 14.4 g/dL (ref 13.0–17.0)
Immature Granulocytes: 0 %
Lymphocytes Relative: 15 %
Lymphs Abs: 1.5 10*3/uL (ref 0.7–4.0)
MCH: 31.1 pg (ref 26.0–34.0)
MCHC: 32 g/dL (ref 30.0–36.0)
MCV: 97.2 fL (ref 80.0–100.0)
Monocytes Absolute: 0.5 10*3/uL (ref 0.1–1.0)
Monocytes Relative: 5 %
Neutro Abs: 7.2 10*3/uL (ref 1.7–7.7)
Neutrophils Relative %: 74 %
Platelets: 225 10*3/uL (ref 150–400)
RBC: 4.63 MIL/uL (ref 4.22–5.81)
RDW: 15.7 % — ABNORMAL HIGH (ref 11.5–15.5)
WBC: 9.7 10*3/uL (ref 4.0–10.5)
nRBC: 0 % (ref 0.0–0.2)

## 2019-12-21 LAB — COMPREHENSIVE METABOLIC PANEL
ALT: 27 U/L (ref 0–44)
AST: 32 U/L (ref 15–41)
Albumin: 3.6 g/dL (ref 3.5–5.0)
Alkaline Phosphatase: 56 U/L (ref 38–126)
Anion gap: 11 (ref 5–15)
BUN: 7 mg/dL — ABNORMAL LOW (ref 8–23)
CO2: 29 mmol/L (ref 22–32)
Calcium: 8.3 mg/dL — ABNORMAL LOW (ref 8.9–10.3)
Chloride: 94 mmol/L — ABNORMAL LOW (ref 98–111)
Creatinine, Ser: 0.8 mg/dL (ref 0.61–1.24)
GFR, Estimated: >60 mL/min (ref >60)
Glucose, Bld: 179 mg/dL — ABNORMAL HIGH (ref 70–99)
Potassium: 3.7 mmol/L (ref 3.5–5.1)
Sodium: 134 mmol/L — ABNORMAL LOW (ref 135–145)
Total Bilirubin: 1.1 mg/dL (ref 0.3–1.2)
Total Protein: 8.3 g/dL — ABNORMAL HIGH (ref 6.5–8.1)

## 2019-12-21 LAB — LACTATE DEHYDROGENASE: LDH: 128 U/L (ref 98–192)

## 2019-12-21 MED ORDER — INFLUENZA VAC A&B SA ADJ QUAD 0.5 ML IM PRSY
0.5000 mL | PREFILLED_SYRINGE | Freq: Once | INTRAMUSCULAR | Status: AC
  Administered 2019-12-21: 0.5 mL via INTRAMUSCULAR
  Filled 2019-12-21: qty 0.5

## 2019-12-21 NOTE — Progress Notes (Signed)
Patient tolerated flu vaccine with no complaints voiced.  Site clean and dry with no bruising or swelling noted at site.  Band aid applied.  VSS. 

## 2019-12-21 NOTE — Progress Notes (Signed)
CONSULT NOTE  Patient Care Team: Benita Stabile, MD as PCP - General (Internal Medicine) Beryle Beams, MD as Consulting Physician (Neurology) Roma Kayser, MD as Consulting Physician (Endocrinology) Darreld Mclean, MD as Consulting Physician (Orthopedic Surgery) Erskine Emery, DPM as Consulting Physician (Podiatry) Marcine Matar, MD as Consulting Physician (Urology) Jethro Bolus, MD as Consulting Physician (Ophthalmology) Neysa Hotter, MD as Consulting Physician (Psychiatry) Sheets, Ivin Booty, LCSW as Counselor (Licensed Clinical Social Worker)  CHIEF COMPLAINTS/PURPOSE OF CONSULTATION: Leukocytosis and weightloss  HISTORY OF PRESENTING ILLNESS:  Wesley Ho 64 y.o. male is here because of intermittent elevated white blood cell count and weight loss.  His white counts have ranged from normal to 14.5.  Mainly neutrophils but has isolated elevated eosinophils as well.   Mr. Wesley Ho is followed closely by Dr. Margo Aye his PCP.  He has past medical history significant for type 2 diabetes, generalized anxiety disorder, neuropathy, COPD, BPH, hypertension, hyperlipidemia and hyper testicular dysfunction.  He is on intermittent testosterone injections.  Apparently has not had injections in several months.  Patient had imaging in 2018 which showed changes of hepatic cirrhosis, normal-appearing spleen, enlarged prostate and stable pulmonary nodules. Has history of left popliteal DVT back in 2011  He denies any chronic infections.  Any steroid or antibiotic use.  Previous cigarette smoker.  Denies any alcohol or illicit drug use.  Denies any B symptoms.  Has 20 pound weight loss in the past month.  Denies any history of autoimmune disease such as lupus or RA.  Patient has BMI of 34.  No history of thyroid problems.   MEDICAL HISTORY:  Past Medical History:  Diagnosis Date  . Anxiety   . Arthritis   . Bronchitis   . Chronic diarrhea   . Chronic edema of lower extremity  09/28/2010  . COPD (chronic obstructive pulmonary disease) (HCC)   . DVT (deep venous thrombosis) (HCC) 09/28/2010  . Emphysema of lung (HCC)   . Fatigue   . H/O Thrombocytopenia 09/28/2010  . Left radial fracture   . Multiple lung nodules 09/28/2010  . Neuropathy     SURGICAL HISTORY: Past Surgical History:  Procedure Laterality Date  . CATARACT EXTRACTION W/PHACO Right 01/17/2016   Procedure: CATARACT EXTRACTION PHACO AND INTRAOCULAR LENS PLACEMENT (IOC);  Surgeon: Jethro Bolus, MD;  Location: [facility];  Service: Ophthalmology;  Laterality: Right;  CDE: 4.34  . CATARACT EXTRACTION W/PHACO Left 01/31/2016   Procedure: CATARACT EXTRACTION PHACO AND INTRAOCULAR LENS PLACEMENT (IOC);  Surgeon: Jethro Bolus, MD;  Location: [facility];  Service: Ophthalmology;  Laterality: Left;  CDE: 5.69  . CHOLECYSTECTOMY    . MOUTH SURGERY      SOCIAL HISTORY: Social History   Socioeconomic History  . Marital status: Divorced    Spouse name: Not on file  . Number of children: Not on file  . Years of education: Not on file  . Highest education level: Not on file  Occupational History  . Not on file  Tobacco Use  . Smoking status: Former Smoker    Packs/day: 1.00    Years: 40.00    Pack years: 40.00    Quit date: 01/11/2013    Years since quitting: 6.9  . Smokeless tobacco: Never Used  Vaping Use  . Vaping Use: Never used  Substance and Sexual Activity  . Alcohol use: No    Comment: stopped drinking 2016.  . Drug use: No  . Sexual activity: Not Currently    Birth control/protection: None  Other Topics  Concern  . Not on file  Social History Narrative  . Not on file   Social Determinants of Health   Financial Resource Strain:   . Difficulty of Paying Living Expenses: Not on file  Food Insecurity:   . Worried About Programme researcher, broadcasting/film/videounning Out of Food in the Last Year: Not on file  . Ran Out of Food in the Last Year: Not on file  Transportation Needs:   . Lack of Transportation (Medical): Not on file  .  Lack of Transportation (Non-Medical): Not on file  Physical Activity:   . Days of Exercise per Week: Not on file  . Minutes of Exercise per Session: Not on file  Stress:   . Feeling of Stress : Not on file  Social Connections:   . Frequency of Communication with Friends and Family: Not on file  . Frequency of Social Gatherings with Friends and Family: Not on file  . Attends Religious Services: Not on file  . Active Member of Clubs or Organizations: Not on file  . Attends BankerClub or Organization Meetings: Not on file  . Marital Status: Not on file  Intimate Partner Violence:   . Fear of Current or Ex-Partner: Not on file  . Emotionally Abused: Not on file  . Physically Abused: Not on file  . Sexually Abused: Not on file    FAMILY HISTORY: Family History  Problem Relation Age of Onset  . Diabetes Mother   . Clotting disorder Father   . Alcohol abuse Maternal Uncle     ALLERGIES:  has No Known Allergies.  MEDICATIONS:  Current Outpatient Medications  Medication Sig Dispense Refill  . acetaminophen (TYLENOL) 500 MG tablet Take 500-1,000 mg by mouth every 6 (six) hours as needed for mild pain.    . ANDROGEL PUMP 20.25 MG/ACT (1.62%) GEL Use 1 pump in the morning 75 g 0  . aspirin EC 81 MG tablet Take 81 mg by mouth daily.    Marland Kitchen. aspirin-sod bicarb-citric acid (ALKA-SELTZER) 325 MG TBEF tablet Take 325 mg by mouth every 6 (six) hours as needed.    . busPIRone (BUSPAR) 15 MG tablet Take 1 tablet (15 mg total) by mouth 3 (three) times daily. 90 tablet 0  . Calcium Carb-Cholecalciferol (CALCIUM 1000 + D PO) Take 1 tablet by mouth daily.    . carboxymethylcellulose (REFRESH TEARS) 0.5 % SOLN 1 drop 2 (two) times daily as needed.     . cholecalciferol (VITAMIN D) 1000 units tablet Take 1,000 Units by mouth daily.    . clobetasol cream (TEMOVATE) 0.05 % Apply to the affected area as directed    . Cyanocobalamin 2500 MCG TABS Take 1 tablet by mouth daily.     . ferrous sulfate (IRON  SUPPLEMENT) 325 (65 FE) MG tablet Take 65 mg by mouth daily with breakfast.    . furosemide (LASIX) 20 MG tablet Take 1 tablet by mouth daily.    . hydrochlorothiazide (HYDRODIURIL) 25 MG tablet Take 25 mg by mouth daily.    Marland Kitchen. HYDROcodone-acetaminophen (NORCO) 7.5-325 MG tablet One every four hours for pain as needed.  Do not drive car or operate machinery while taking this medicine.  Must last 14 days. (Patient not taking: Reported on 04/23/2017) 56 tablet 0  . lidocaine-prilocaine (EMLA) cream Apply to feet for neuropathic pain    . LORazepam (ATIVAN) 2 MG tablet 1-2 mg three times a day as needed for anxiety 90 tablet 1  . Multiple Vitamins-Minerals (HM COMPLETE 50+ MENS ULTIMATE PO) Take  1 tablet by mouth daily.    . Papaverine-Alprostadil 30-10 MG-MCG/ML SOLN by Intracavernosal route.    . Potassium 99 MG TABS Take 99 mg by mouth daily.    . pregabalin (LYRICA) 200 MG capsule Take 200 mg by mouth 3 (three) times daily.    Marland Kitchen Specialty Vitamins Products (MM BIOTIN/KERATIN PO) Take 1 capsule by mouth daily.    . tamoxifen (NOLVADEX) 20 MG tablet Take 20 mg by mouth daily.    . tamsulosin (FLOMAX) 0.4 MG CAPS capsule Take 1 capsule by mouth daily.    . WHEY PROTEIN PO Take by mouth daily as needed.      No current facility-administered medications for this visit.    REVIEW OF SYSTEMS:   Constitutional: Denies fevers, chills or abnormal night sweats Eyes: Denies blurriness of vision, double vision or watery eyes Ears, nose, mouth, throat, and face: Denies mucositis or sore throat Respiratory: Denies cough, dyspnea or wheezes Cardiovascular: Denies palpitation, chest discomfort or lower extremity swelling Gastrointestinal:  Denies nausea, heartburn or change in bowel habits Skin: Denies abnormal skin rashes Lymphatics: Denies new lymphadenopathy or easy bruising Neurological:Denies numbness, tingling or new weaknesses Behavioral/Psych: Mood is stable, no new changes  All other systems were  reviewed with the patient and are negative.  PHYSICAL EXAMINATION: ECOG PERFORMANCE STATUS: 1 - Symptomatic but completely ambulatory  There were no vitals filed for this visit. There were no vitals filed for this visit.  GENERAL:alert, no distress and comfortable SKIN: skin color, texture, turgor are normal, no rashes or significant lesions EYES: normal, conjunctiva are pink and non-injected, sclera clear OROPHARYNX:no exudate, no erythema and lips, buccal mucosa, and tongue normal  NECK: supple, thyroid normal size, non-tender, without nodularity LYMPH:  no palpable lymphadenopathy in the cervical, axillary or inguinal LUNGS: clear to auscultation and percussion with normal breathing effort HEART: regular rate & rhythm and no murmurs and no lower extremity edema ABDOMEN:abdomen soft, non-tender and normal bowel sounds Musculoskeletal:no cyanosis of digits and no clubbing  PSYCH: alert & oriented x 3 with fluent speech NEURO: no focal motor/sensory deficits  LABORATORY DATA:  I have reviewed the data as listed No results found for this or any previous visit (from the past 2160 hour(s)).  RADIOGRAPHIC STUDIES: I have personally reviewed the radiological images as listed and agreed with the findings in the report. No results found.  ASSESSMENT & PLAN:  .  Leukocytosis: -He has had intermittent leukocytosis since 2011, mainly neutrophils with one episode of elevated eosinophils. -Denies any fevers, night sweats or weight loss.    No recurrent infections.   --Denies any systemic steroid use.  Uses steroid inhalers. -CT [facility] on 11/08/2019 done for right upper quadrant pain shows spleen is normal in size without focal abnormality.  2.  Social/family history: -Has a family history of a clotting disorder. --No history of cancer.  PLAN:  1.  Leukocytosis: -We will repeat her CBC today along with LDH. -Will evaluate for myeloproliferative disorders by checking JAK2 V6 68F mutation  and BCR/ABL. -We will also check for flow cytometry to evaluate for lymphoproliferative disorders. -RTC 2 to 3 weeks to discuss results.  2. Anemia: --Intermittent --Most recent hemoglobin is 14.5 11/23/2019. --No recent colonoscopy  3.  Weight loss: --Has lost about 15 pounds over the past month. --Prior smoker-smoked 1 pack/day for about 40 years. --Possible imaging with CT chest may be warranted  Disposition: RTC in about 3 weeks to discuss lab work and review CT scan.  No  problem-specific Assessment & Plan notes found for this encounter.  I have independently interviewed and examined this patient and agree with HPI written by my nurse practitioner Durenda Hurt, NP.  Patient evaluated for leukocytosis, predominantly neutrophilic with mild elevation of eosinophils.  He has had intermittent leukocytosis since 2011.  Hence we will order blood work for myeloproliferative disorders.   All questions were answered. The patient knows to call the clinic with any problems, questions or concerns.     Mauro Kaufmann, NP 12/21/19 8:23 AM

## 2019-12-22 LAB — PATHOLOGIST SMEAR REVIEW

## 2019-12-26 LAB — BCR-ABL1, CML/ALL, PCR, QUANT: Interpretation (BCRAL):: NEGATIVE

## 2019-12-29 LAB — CALR + JAK2 E12-15 + MPL (REFLEXED)

## 2019-12-29 LAB — JAK2 V617F, W REFLEX TO CALR/E12/MPL

## 2020-01-02 LAB — FISH ONCOLOGY
Cells Analyzed: 200
Cells Counted: 200

## 2020-01-04 DIAGNOSIS — N400 Enlarged prostate without lower urinary tract symptoms: Secondary | ICD-10-CM | POA: Diagnosis not present

## 2020-01-04 DIAGNOSIS — E782 Mixed hyperlipidemia: Secondary | ICD-10-CM | POA: Diagnosis not present

## 2020-01-04 DIAGNOSIS — I10 Essential (primary) hypertension: Secondary | ICD-10-CM | POA: Diagnosis not present

## 2020-01-04 DIAGNOSIS — E1169 Type 2 diabetes mellitus with other specified complication: Secondary | ICD-10-CM | POA: Diagnosis not present

## 2020-01-12 ENCOUNTER — Ambulatory Visit (HOSPITAL_COMMUNITY): Payer: PPO | Admitting: Hematology

## 2020-01-21 ENCOUNTER — Other Ambulatory Visit: Payer: Self-pay

## 2020-01-21 ENCOUNTER — Inpatient Hospital Stay (HOSPITAL_COMMUNITY): Payer: PPO | Attending: Hematology | Admitting: Hematology

## 2020-01-21 VITALS — BP 153/64 | HR 64 | Temp 97.1°F | Resp 16 | Wt 237.7 lb

## 2020-01-21 DIAGNOSIS — D72825 Bandemia: Secondary | ICD-10-CM | POA: Diagnosis not present

## 2020-01-21 DIAGNOSIS — Z87891 Personal history of nicotine dependence: Secondary | ICD-10-CM | POA: Insufficient documentation

## 2020-01-21 DIAGNOSIS — D72829 Elevated white blood cell count, unspecified: Secondary | ICD-10-CM | POA: Diagnosis not present

## 2020-01-21 DIAGNOSIS — Z86718 Personal history of other venous thrombosis and embolism: Secondary | ICD-10-CM | POA: Diagnosis not present

## 2020-01-21 DIAGNOSIS — E119 Type 2 diabetes mellitus without complications: Secondary | ICD-10-CM | POA: Diagnosis not present

## 2020-01-21 DIAGNOSIS — R634 Abnormal weight loss: Secondary | ICD-10-CM | POA: Diagnosis not present

## 2020-01-21 DIAGNOSIS — J449 Chronic obstructive pulmonary disease, unspecified: Secondary | ICD-10-CM | POA: Insufficient documentation

## 2020-01-21 NOTE — Patient Instructions (Signed)
[facility] at [facility] Discharge Instructions  You were seen today by Dr. Ellin Saba. He went over your recent results; your blood work was negative for leukemias or myelomas. Dr. Ellin Saba will see you back in 4 months for labs and follow up.   Thank you for choosing [facility] at [facility] to provide your oncology and hematology care.  To afford each patient quality time with our provider, please arrive at least 15 minutes before your scheduled appointment time.   If you have a lab appointment with the Cancer Center please come in thru the Main Entrance and check in at the main information desk  You need to re-schedule your appointment should you arrive 10 or more minutes late.  We strive to give you quality time with our providers, and arriving late affects you and other patients whose appointments are after yours.  Also, if you no show three or more times for appointments you may be dismissed from the clinic at the providers discretion.     Again, thank you for choosing [facility].  Our hope is that these requests will decrease the amount of time that you wait before being seen by our physicians.       _____________________________________________________________  Should you have questions after your visit to [facility], please contact our office at [phone] between the hours of 8:00 a.m. and 4:30 p.m.  Voicemails left after 4:00 p.m. will not be returned until the following business day.  For prescription refill requests, have your pharmacy contact our office and allow 72 hours.    Cancer Center Support Programs:   > Cancer Support Group  2nd Tuesday of the month 1pm-2pm, Journey Room

## 2020-01-21 NOTE — Progress Notes (Signed)
[facility] [address], [place] [address]   CLINIC:  Medical Oncology/Hematology  PCP:  Benita Stabile, MD [address] [place] [address]  [phone]  REASON FOR VISIT:  Follow-up for leukocytosis and weight loss  PRIOR THERAPY: None  CURRENT THERAPY: None  INTERVAL HISTORY:  Wesley Ho, a 64 y.o. 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 Wesley Ho was last seen on 12/21/2019.  Today he reports feeling okay. He denies having any nosebleeds, hematochezia or hematuria, though he reports having easy bruising especially after injections. He is not currently trying to lose weight but his appetite is down from his usual; his usual is 1 meal per day and snacking throughout the day.  He reports that he is currently stressed due to issues with his estranged daughter and not having seen his granddaugther since she was several months old. He also reports that within the past 1.5 years, he had buried his father, mother, step-father and step-mother.   REVIEW OF SYSTEMS:  Review of Systems  Constitutional: Positive for appetite change (75%) and fatigue (50%).  HENT:   Negative for nosebleeds.   Respiratory: Positive for shortness of breath.   Gastrointestinal: Negative for blood in stool.  Genitourinary: Negative for hematuria.   Neurological: Positive for dizziness and numbness (feet).  Hematological: Bruises/bleeds easily.  Psychiatric/Behavioral: Positive for sleep disturbance.  All other systems reviewed and are negative.   PAST MEDICAL/SURGICAL HISTORY:  Past Medical History:  Diagnosis Date  . Anxiety   . Arthritis   . Bronchitis   . Chronic diarrhea   . Chronic edema of lower extremity 09/28/2010  . COPD (chronic obstructive pulmonary disease) (HCC)   . Diabetes mellitus without complication (HCC)   . DVT (deep venous thrombosis) (HCC) 09/28/2010  . Emphysema of lung (HCC)   . Fatigue   . H/O Thrombocytopenia 09/28/2010   . Left radial fracture   . Multiple lung nodules 09/28/2010  . Neuropathy    Past Surgical History:  Procedure Laterality Date  . CATARACT EXTRACTION W/PHACO Right 01/17/2016   Procedure: CATARACT EXTRACTION PHACO AND INTRAOCULAR LENS PLACEMENT (IOC);  Surgeon: Jethro Bolus, MD;  Location: [facility];  Service: Ophthalmology;  Laterality: Right;  CDE: 4.34  . CATARACT EXTRACTION W/PHACO Left 01/31/2016   Procedure: CATARACT EXTRACTION PHACO AND INTRAOCULAR LENS PLACEMENT (IOC);  Surgeon: Jethro Bolus, MD;  Location: [facility];  Service: Ophthalmology;  Laterality: Left;  CDE: 5.69  . CHOLECYSTECTOMY    . MOUTH SURGERY      SOCIAL HISTORY:  Social History   Socioeconomic History  . Marital status: Divorced    Spouse name: Not on file  . Number of children: 2  . Years of education: Not on file  . Highest education level: Not on file  Occupational History  . Occupation: Retired  Tobacco Use  . Smoking status: Former Smoker    Packs/day: 1.00    Years: 40.00    Pack years: 40.00    Quit date: 01/11/2013    Years since quitting: 7.0  . Smokeless tobacco: Never Used  Vaping Use  . Vaping Use: Never used  Substance and Sexual Activity  . Alcohol use: No    Comment: stopped drinking 2016.  . Drug use: No  . Sexual activity: Not Currently    Birth control/protection: None  Other Topics Concern  . Not on file  Social History Narrative  . Not on file   Social Determinants  of Health   Financial Resource Strain:   . Difficulty of Paying Living Expenses: Not on file  Food Insecurity:   . Worried About Programme researcher, broadcasting/film/video in the Last Year: Not on file  . Ran Out of Food in the Last Year: Not on file  Transportation Needs:   . Lack of Transportation (Medical): Not on file  . Lack of Transportation (Non-Medical): Not on file  Physical Activity:   . Days of Exercise per Week: Not on file  . Minutes of Exercise per Session: Not on file  Stress:   . Feeling of Stress : Not on file   Social Connections:   . Frequency of Communication with Friends and Family: Not on file  . Frequency of Social Gatherings with Friends and Family: Not on file  . Attends Religious Services: Not on file  . Active Member of Clubs or Organizations: Not on file  . Attends Banker Meetings: Not on file  . Marital Status: Not on file  Intimate Partner Violence:   . Fear of Current or Ex-Partner: Not on file  . Emotionally Abused: Not on file  . Physically Abused: Not on file  . Sexually Abused: Not on file    FAMILY HISTORY:  Family History  Problem Relation Age of Onset  . Diabetes Mother   . Clotting disorder Father   . Alcohol abuse Maternal Uncle   . Lung cancer Maternal Grandfather   . Emphysema Paternal Grandfather     CURRENT MEDICATIONS:  Current Outpatient Medications  Medication Sig Dispense Refill  . acetaminophen (TYLENOL) 500 MG tablet Take 500-1,000 mg by mouth every 6 (six) hours as needed for mild pain.    Marland Kitchen ALPRAZolam (XANAX) 1 MG tablet Take 1 mg by mouth every 6 (six) hours as needed.    Marland Kitchen aspirin EC 81 MG tablet Take 81 mg by mouth daily.    Marland Kitchen aspirin-sod bicarb-citric acid (ALKA-SELTZER) 325 MG TBEF tablet Take 325 mg by mouth every 6 (six) hours as needed.    Marland Kitchen atorvastatin (LIPITOR) 40 MG tablet SMARTSIG:1 Tablet(s) By Mouth Every Evening    . busPIRone (BUSPAR) 15 MG tablet Take 1 tablet (15 mg total) by mouth 3 (three) times daily. 90 tablet 0  . Calcium Carb-Cholecalciferol (CALCIUM 1000 + D PO) Take 1 tablet by mouth daily.    . carboxymethylcellulose (REFRESH TEARS) 0.5 % SOLN 1 drop 2 (two) times daily as needed.     . cholecalciferol (VITAMIN D) 1000 units tablet Take 1,000 Units by mouth daily.    . clobetasol cream (TEMOVATE) 0.05 % Apply to the affected area as directed    . Cyanocobalamin 2500 MCG TABS Take 1 tablet by mouth daily.     . DULoxetine (CYMBALTA) 30 MG capsule Take 30 mg by mouth 2 (two) times daily.    . ferrous sulfate  (IRON SUPPLEMENT) 325 (65 FE) MG tablet Take 65 mg by mouth daily with breakfast.    . finasteride (PROSCAR) 5 MG tablet Take 5 mg by mouth daily.    . furosemide (LASIX) 20 MG tablet Take 1 tablet by mouth daily.    Marland Kitchen glipiZIDE (GLUCOTROL) 5 MG tablet Take 5 mg by mouth 2 (two) times daily.    . hydrochlorothiazide (HYDRODIURIL) 25 MG tablet Take 25 mg by mouth daily.    Marland Kitchen ketoconazole (NIZORAL) 2 % shampoo Apply topically 2 (two) times a week.    . lidocaine-prilocaine (EMLA) cream Apply to feet for neuropathic pain    .  LORazepam (ATIVAN) 2 MG tablet 1-2 mg three times a day as needed for anxiety 90 tablet 1  . metFORMIN (GLUCOPHAGE) 500 MG tablet Take 500 mg by mouth daily.    . montelukast (SINGULAIR) 10 MG tablet Take 10 mg by mouth daily.    . Multiple Vitamins-Minerals (HM COMPLETE 50+ MENS ULTIMATE PO) Take 1 tablet by mouth daily.    Marland Kitchen omega-3 acid ethyl esters (LOVAZA) 1 g capsule Take 2 capsules by mouth daily.    . Papaverine-Alprostadil 30-10 MG-MCG/ML SOLN by Intracavernosal route.    . Potassium 99 MG TABS Take 99 mg by mouth daily.    . pregabalin (LYRICA) 200 MG capsule Take 200 mg by mouth 3 (three) times daily.    . propranolol (INDERAL) 60 MG tablet SMARTSIG:1 Tablet(s) By Mouth Morning-Evening    . Specialty Vitamins Products (MM BIOTIN/KERATIN PO) Take 1 capsule by mouth daily.    . tamoxifen (NOLVADEX) 20 MG tablet Take 20 mg by mouth daily.    . tamsulosin (FLOMAX) 0.4 MG CAPS capsule Take 1 capsule by mouth daily.    Marland Kitchen testosterone cypionate (DEPOTESTOSTERONE CYPIONATE) 200 MG/ML injection Inject into the skin once a week.    . TRELEGY ELLIPTA 100-62.5-25 MCG/INH AEPB     . WHEY PROTEIN PO Take by mouth daily as needed.      No current facility-administered medications for this visit.    ALLERGIES:  No Known Allergies  PHYSICAL EXAM:  Performance status (ECOG): 1 - Symptomatic but completely ambulatory  Vitals:   01/21/20 1140  BP: (!) 153/64  Pulse: 64   Resp: 16  Temp: (!) 97.1 F (36.2 C)  SpO2: 93%   Wt Readings from Last 3 Encounters:  01/21/20 237 lb 11.2 oz (107.8 kg)  12/21/19 242 lb 8.1 oz (110 kg)  09/09/19 262 lb 5.6 oz (119 kg)   Physical Exam Vitals reviewed.  Constitutional:      Appearance: Normal appearance. He is obese.  Neurological:     General: No focal deficit present.     Mental Status: He is alert and oriented to person, place, and time.  Psychiatric:        Mood and Affect: Mood normal.        Behavior: Behavior normal.     LABORATORY DATA:  I have reviewed the labs as listed.  CBC Latest Ref Rng & Units 12/21/2019 09/02/2019 02/08/2017  WBC 4.0 - 10.5 K/uL 9.7 11.9(H) 7.6  Hemoglobin 13.0 - 17.0 g/dL 75.8 83.2 54.9  Hematocrit 39 - 52 % 45.0 42.4 39.6  Platelets 150 - 400 K/uL 225 228 143   CMP Latest Ref Rng & Units 12/21/2019 09/02/2019 02/08/2017  Glucose 70 - 99 mg/dL 826(E) 98 -  BUN 8 - 23 mg/dL 7(L) 11 -  Creatinine 1.58 - 1.24 mg/dL 3.09 4.07 -  Sodium 680 - 145 mmol/L 134(L) 138 -  Potassium 3.5 - 5.1 mmol/L 3.7 4.0 -  Chloride 98 - 111 mmol/L 94(L) 99 -  CO2 22 - 32 mmol/L 29 29 -  Calcium 8.9 - 10.3 mg/dL 8.3(L) 8.1(L) -  Total Protein 6.5 - 8.1 g/dL 8.3(H) 7.6 6.8  Total Bilirubin 0.3 - 1.2 mg/dL 1.1 8.8(P) 0.4  Alkaline Phos 38 - 126 U/L 56 44 -  AST 15 - 41 U/L 32 24 22  ALT 0 - 44 U/L 27 24 16       Component Value Date/Time   RBC 4.63 12/21/2019 1025   MCV 97.2 12/21/2019 1025  MCH 31.1 12/21/2019 1025   MCHC 32.0 12/21/2019 1025   RDW 15.7 (H) 12/21/2019 1025   LYMPHSABS 1.5 12/21/2019 1025   MONOABS 0.5 12/21/2019 1025   EOSABS 0.5 12/21/2019 1025   BASOSABS 0.1 12/21/2019 1025   Lab Results  Component Value Date   LDH 128 12/21/2019    DIAGNOSTIC IMAGING:  I have independently reviewed the scans and discussed with the patient. No results found.   ASSESSMENT:  1. Leukocytosis: -Hehas had intermittent leukocytosis since 2011, mainly neutrophils with one  episode of elevated eosinophils. -Denies any fevers, night sweats or weight loss.   No recurrent infections.   --Denies any systemic steroid use.  Uses steroid inhalers. -CT AP on 11/08/2019 done for right upper quadrant pain shows spleen is normal in size without focal abnormality.  2. Social/family history: -Has a family history of a clotting disorder. --No history of cancer.   PLAN:  1. Leukocytosis: -I have reviewed labs from 12/23/2019.  White count has normalized at 9.7 with normal differential. -JAK2 V617F, BCR/ABL testing was negative.  2.  Weight loss: -He does not report any sudden weight loss.  He lost 15 pounds over the last several months. -He is reportedly undergoing a lot of stress.  Recent chest x-ray on 09/02/2019 was negative. -We will monitor his weight in 4 months.  We will consider scans if he continues to lose weight.  3.  Tobacco abuse: -He smoked 1 pack/day for 40 years.  He quit smoking at this time. -We will evaluate him at next visit to see if he qualifies for CT of the chest.   Orders placed this encounter:  No orders of the defined types were placed in this encounter.    Doreatha MassedSreedhar Criston Chancellor, MD [facility] [phone]   I, Drue Secondaniel Khashchuk, am acting as a scribe for Dr. Payton MccallumSreedhar Katagadda.  I, Doreatha MassedSreedhar Yunus Stoklosa MD, have reviewed the above documentation for accuracy and completeness, and I agree with the above.

## 2020-03-04 DIAGNOSIS — E1169 Type 2 diabetes mellitus with other specified complication: Secondary | ICD-10-CM | POA: Diagnosis not present

## 2020-03-04 DIAGNOSIS — I10 Essential (primary) hypertension: Secondary | ICD-10-CM | POA: Diagnosis not present

## 2020-03-04 DIAGNOSIS — N400 Enlarged prostate without lower urinary tract symptoms: Secondary | ICD-10-CM | POA: Diagnosis not present

## 2020-03-04 DIAGNOSIS — E782 Mixed hyperlipidemia: Secondary | ICD-10-CM | POA: Diagnosis not present

## 2020-04-02 DIAGNOSIS — E785 Hyperlipidemia, unspecified: Secondary | ICD-10-CM | POA: Diagnosis not present

## 2020-04-02 DIAGNOSIS — E870 Hyperosmolality and hypernatremia: Secondary | ICD-10-CM | POA: Diagnosis not present

## 2020-04-02 DIAGNOSIS — F419 Anxiety disorder, unspecified: Secondary | ICD-10-CM | POA: Diagnosis not present

## 2020-04-02 DIAGNOSIS — I10 Essential (primary) hypertension: Secondary | ICD-10-CM | POA: Diagnosis not present

## 2020-04-02 DIAGNOSIS — Z23 Encounter for immunization: Secondary | ICD-10-CM | POA: Diagnosis not present

## 2020-05-02 DIAGNOSIS — F419 Anxiety disorder, unspecified: Secondary | ICD-10-CM | POA: Diagnosis not present

## 2020-05-02 DIAGNOSIS — E870 Hyperosmolality and hypernatremia: Secondary | ICD-10-CM | POA: Diagnosis not present

## 2020-05-02 DIAGNOSIS — E785 Hyperlipidemia, unspecified: Secondary | ICD-10-CM | POA: Diagnosis not present

## 2020-05-02 DIAGNOSIS — I10 Essential (primary) hypertension: Secondary | ICD-10-CM | POA: Diagnosis not present

## 2020-05-12 ENCOUNTER — Other Ambulatory Visit: Payer: Self-pay

## 2020-05-12 ENCOUNTER — Inpatient Hospital Stay (HOSPITAL_COMMUNITY): Payer: PPO | Attending: Hematology

## 2020-05-12 DIAGNOSIS — D72825 Bandemia: Secondary | ICD-10-CM | POA: Insufficient documentation

## 2020-05-12 LAB — CBC WITH DIFFERENTIAL/PLATELET
Abs Immature Granulocytes: 0.01 10*3/uL (ref 0.00–0.07)
Basophils Absolute: 0.1 10*3/uL (ref 0.0–0.1)
Basophils Relative: 1 %
Eosinophils Absolute: 0.5 10*3/uL (ref 0.0–0.5)
Eosinophils Relative: 5 %
HCT: 47.7 % (ref 39.0–52.0)
Hemoglobin: 15 g/dL (ref 13.0–17.0)
Immature Granulocytes: 0 %
Lymphocytes Relative: 18 %
Lymphs Abs: 1.9 10*3/uL (ref 0.7–4.0)
MCH: 31.4 pg (ref 26.0–34.0)
MCHC: 31.4 g/dL (ref 30.0–36.0)
MCV: 100 fL (ref 80.0–100.0)
Monocytes Absolute: 0.7 10*3/uL (ref 0.1–1.0)
Monocytes Relative: 6 %
Neutro Abs: 7.4 10*3/uL (ref 1.7–7.7)
Neutrophils Relative %: 70 %
Platelets: 242 10*3/uL (ref 150–400)
RBC: 4.77 MIL/uL (ref 4.22–5.81)
RDW: 14.7 % (ref 11.5–15.5)
WBC: 10.5 10*3/uL (ref 4.0–10.5)
nRBC: 0 % (ref 0.0–0.2)

## 2020-05-12 LAB — LACTATE DEHYDROGENASE: LDH: 145 U/L (ref 98–192)

## 2020-05-19 ENCOUNTER — Inpatient Hospital Stay (HOSPITAL_COMMUNITY): Payer: PPO | Admitting: Oncology

## 2020-05-19 ENCOUNTER — Other Ambulatory Visit: Payer: Self-pay

## 2020-05-19 ENCOUNTER — Ambulatory Visit (HOSPITAL_COMMUNITY): Payer: PPO | Admitting: Hematology

## 2020-05-19 DIAGNOSIS — D72825 Bandemia: Secondary | ICD-10-CM | POA: Diagnosis not present

## 2020-05-19 NOTE — Progress Notes (Addendum)
Error.  Durenda Hurt, NP 09/08/2020 12:45 PM        This encounter was created in error - please disregard.

## 2020-06-01 DIAGNOSIS — N400 Enlarged prostate without lower urinary tract symptoms: Secondary | ICD-10-CM | POA: Diagnosis not present

## 2020-06-01 DIAGNOSIS — I10 Essential (primary) hypertension: Secondary | ICD-10-CM | POA: Diagnosis not present

## 2020-06-01 DIAGNOSIS — E1169 Type 2 diabetes mellitus with other specified complication: Secondary | ICD-10-CM | POA: Diagnosis not present

## 2020-06-01 DIAGNOSIS — E782 Mixed hyperlipidemia: Secondary | ICD-10-CM | POA: Diagnosis not present

## 2020-07-03 DIAGNOSIS — I10 Essential (primary) hypertension: Secondary | ICD-10-CM | POA: Diagnosis not present

## 2020-07-03 DIAGNOSIS — E785 Hyperlipidemia, unspecified: Secondary | ICD-10-CM | POA: Diagnosis not present

## 2020-07-03 DIAGNOSIS — R7989 Other specified abnormal findings of blood chemistry: Secondary | ICD-10-CM | POA: Diagnosis not present

## 2020-07-03 DIAGNOSIS — N401 Enlarged prostate with lower urinary tract symptoms: Secondary | ICD-10-CM | POA: Diagnosis not present

## 2020-07-03 DIAGNOSIS — E7800 Pure hypercholesterolemia, unspecified: Secondary | ICD-10-CM | POA: Diagnosis not present

## 2020-07-03 DIAGNOSIS — F419 Anxiety disorder, unspecified: Secondary | ICD-10-CM | POA: Diagnosis not present

## 2020-07-03 DIAGNOSIS — E1100 Type 2 diabetes mellitus with hyperosmolarity without nonketotic hyperglycemic-hyperosmolar coma (NKHHC): Secondary | ICD-10-CM | POA: Diagnosis not present

## 2020-07-03 DIAGNOSIS — J449 Chronic obstructive pulmonary disease, unspecified: Secondary | ICD-10-CM | POA: Diagnosis not present

## 2020-07-03 DIAGNOSIS — G629 Polyneuropathy, unspecified: Secondary | ICD-10-CM | POA: Diagnosis not present

## 2020-07-03 DIAGNOSIS — H547 Unspecified visual loss: Secondary | ICD-10-CM | POA: Diagnosis not present

## 2020-07-21 DIAGNOSIS — I10 Essential (primary) hypertension: Secondary | ICD-10-CM | POA: Diagnosis not present

## 2020-07-21 DIAGNOSIS — E785 Hyperlipidemia, unspecified: Secondary | ICD-10-CM | POA: Diagnosis not present

## 2020-08-02 DIAGNOSIS — N401 Enlarged prostate with lower urinary tract symptoms: Secondary | ICD-10-CM | POA: Diagnosis not present

## 2020-08-02 DIAGNOSIS — H547 Unspecified visual loss: Secondary | ICD-10-CM | POA: Diagnosis not present

## 2020-08-02 DIAGNOSIS — F419 Anxiety disorder, unspecified: Secondary | ICD-10-CM | POA: Diagnosis not present

## 2020-08-02 DIAGNOSIS — J449 Chronic obstructive pulmonary disease, unspecified: Secondary | ICD-10-CM | POA: Diagnosis not present

## 2020-08-02 DIAGNOSIS — E1100 Type 2 diabetes mellitus with hyperosmolarity without nonketotic hyperglycemic-hyperosmolar coma (NKHHC): Secondary | ICD-10-CM | POA: Diagnosis not present

## 2020-08-02 DIAGNOSIS — G629 Polyneuropathy, unspecified: Secondary | ICD-10-CM | POA: Diagnosis not present

## 2020-08-02 DIAGNOSIS — E785 Hyperlipidemia, unspecified: Secondary | ICD-10-CM | POA: Diagnosis not present

## 2020-08-02 DIAGNOSIS — R7989 Other specified abnormal findings of blood chemistry: Secondary | ICD-10-CM | POA: Diagnosis not present

## 2020-08-02 DIAGNOSIS — E7800 Pure hypercholesterolemia, unspecified: Secondary | ICD-10-CM | POA: Diagnosis not present

## 2020-08-02 DIAGNOSIS — I10 Essential (primary) hypertension: Secondary | ICD-10-CM | POA: Diagnosis not present

## 2020-09-01 DIAGNOSIS — I10 Essential (primary) hypertension: Secondary | ICD-10-CM | POA: Diagnosis not present

## 2020-09-01 DIAGNOSIS — F419 Anxiety disorder, unspecified: Secondary | ICD-10-CM | POA: Diagnosis not present

## 2020-10-02 DIAGNOSIS — I10 Essential (primary) hypertension: Secondary | ICD-10-CM | POA: Diagnosis not present

## 2020-10-02 DIAGNOSIS — F419 Anxiety disorder, unspecified: Secondary | ICD-10-CM | POA: Diagnosis not present

## 2020-11-02 DIAGNOSIS — E1100 Type 2 diabetes mellitus with hyperosmolarity without nonketotic hyperglycemic-hyperosmolar coma (NKHHC): Secondary | ICD-10-CM | POA: Diagnosis not present

## 2020-11-02 DIAGNOSIS — F419 Anxiety disorder, unspecified: Secondary | ICD-10-CM | POA: Diagnosis not present

## 2020-11-02 DIAGNOSIS — I10 Essential (primary) hypertension: Secondary | ICD-10-CM | POA: Diagnosis not present

## 2020-11-02 DIAGNOSIS — E119 Type 2 diabetes mellitus without complications: Secondary | ICD-10-CM | POA: Diagnosis not present

## 2020-11-09 DIAGNOSIS — E1100 Type 2 diabetes mellitus with hyperosmolarity without nonketotic hyperglycemic-hyperosmolar coma (NKHHC): Secondary | ICD-10-CM | POA: Diagnosis not present

## 2020-11-09 DIAGNOSIS — F419 Anxiety disorder, unspecified: Secondary | ICD-10-CM | POA: Diagnosis not present

## 2020-11-09 DIAGNOSIS — G629 Polyneuropathy, unspecified: Secondary | ICD-10-CM | POA: Diagnosis not present

## 2020-11-09 DIAGNOSIS — J449 Chronic obstructive pulmonary disease, unspecified: Secondary | ICD-10-CM | POA: Diagnosis not present

## 2020-11-09 DIAGNOSIS — R7989 Other specified abnormal findings of blood chemistry: Secondary | ICD-10-CM | POA: Diagnosis not present

## 2020-11-09 DIAGNOSIS — I10 Essential (primary) hypertension: Secondary | ICD-10-CM | POA: Diagnosis not present

## 2020-11-09 DIAGNOSIS — Z0001 Encounter for general adult medical examination with abnormal findings: Secondary | ICD-10-CM | POA: Diagnosis not present

## 2020-11-09 DIAGNOSIS — F5221 Male erectile disorder: Secondary | ICD-10-CM | POA: Diagnosis not present

## 2020-11-09 DIAGNOSIS — N401 Enlarged prostate with lower urinary tract symptoms: Secondary | ICD-10-CM | POA: Diagnosis not present

## 2020-11-09 DIAGNOSIS — H547 Unspecified visual loss: Secondary | ICD-10-CM | POA: Diagnosis not present

## 2020-11-09 DIAGNOSIS — E785 Hyperlipidemia, unspecified: Secondary | ICD-10-CM | POA: Diagnosis not present

## 2020-12-02 DIAGNOSIS — I10 Essential (primary) hypertension: Secondary | ICD-10-CM | POA: Diagnosis not present

## 2020-12-02 DIAGNOSIS — F419 Anxiety disorder, unspecified: Secondary | ICD-10-CM | POA: Diagnosis not present

## 2020-12-09 DIAGNOSIS — E1100 Type 2 diabetes mellitus with hyperosmolarity without nonketotic hyperglycemic-hyperosmolar coma (NKHHC): Secondary | ICD-10-CM | POA: Diagnosis not present

## 2020-12-09 DIAGNOSIS — Z760 Encounter for issue of repeat prescription: Secondary | ICD-10-CM | POA: Diagnosis not present

## 2021-01-02 DIAGNOSIS — F419 Anxiety disorder, unspecified: Secondary | ICD-10-CM | POA: Diagnosis not present

## 2021-01-02 DIAGNOSIS — I10 Essential (primary) hypertension: Secondary | ICD-10-CM | POA: Diagnosis not present

## 2021-01-02 DIAGNOSIS — E782 Mixed hyperlipidemia: Secondary | ICD-10-CM | POA: Diagnosis not present

## 2021-01-23 DIAGNOSIS — B351 Tinea unguium: Secondary | ICD-10-CM | POA: Diagnosis not present

## 2021-01-23 DIAGNOSIS — M79671 Pain in right foot: Secondary | ICD-10-CM | POA: Diagnosis not present

## 2021-01-23 DIAGNOSIS — E1140 Type 2 diabetes mellitus with diabetic neuropathy, unspecified: Secondary | ICD-10-CM | POA: Diagnosis not present

## 2021-02-01 DIAGNOSIS — F419 Anxiety disorder, unspecified: Secondary | ICD-10-CM | POA: Diagnosis not present

## 2021-02-01 DIAGNOSIS — I10 Essential (primary) hypertension: Secondary | ICD-10-CM | POA: Diagnosis not present

## 2021-02-06 DIAGNOSIS — E1100 Type 2 diabetes mellitus with hyperosmolarity without nonketotic hyperglycemic-hyperosmolar coma (NKHHC): Secondary | ICD-10-CM | POA: Diagnosis not present

## 2021-02-06 DIAGNOSIS — I10 Essential (primary) hypertension: Secondary | ICD-10-CM | POA: Diagnosis not present

## 2021-02-14 ENCOUNTER — Encounter (INDEPENDENT_AMBULATORY_CARE_PROVIDER_SITE_OTHER): Payer: Self-pay | Admitting: *Deleted

## 2021-02-14 DIAGNOSIS — E785 Hyperlipidemia, unspecified: Secondary | ICD-10-CM | POA: Diagnosis not present

## 2021-02-14 DIAGNOSIS — R7989 Other specified abnormal findings of blood chemistry: Secondary | ICD-10-CM | POA: Diagnosis not present

## 2021-02-14 DIAGNOSIS — F419 Anxiety disorder, unspecified: Secondary | ICD-10-CM | POA: Diagnosis not present

## 2021-02-14 DIAGNOSIS — Z1211 Encounter for screening for malignant neoplasm of colon: Secondary | ICD-10-CM | POA: Diagnosis not present

## 2021-02-14 DIAGNOSIS — G629 Polyneuropathy, unspecified: Secondary | ICD-10-CM | POA: Diagnosis not present

## 2021-02-14 DIAGNOSIS — J449 Chronic obstructive pulmonary disease, unspecified: Secondary | ICD-10-CM | POA: Diagnosis not present

## 2021-02-14 DIAGNOSIS — E1100 Type 2 diabetes mellitus with hyperosmolarity without nonketotic hyperglycemic-hyperosmolar coma (NKHHC): Secondary | ICD-10-CM | POA: Diagnosis not present

## 2021-02-14 DIAGNOSIS — F5221 Male erectile disorder: Secondary | ICD-10-CM | POA: Diagnosis not present

## 2021-02-14 DIAGNOSIS — N401 Enlarged prostate with lower urinary tract symptoms: Secondary | ICD-10-CM | POA: Diagnosis not present

## 2021-02-14 DIAGNOSIS — I10 Essential (primary) hypertension: Secondary | ICD-10-CM | POA: Diagnosis not present

## 2021-02-14 DIAGNOSIS — H547 Unspecified visual loss: Secondary | ICD-10-CM | POA: Diagnosis not present

## 2021-03-03 DIAGNOSIS — I10 Essential (primary) hypertension: Secondary | ICD-10-CM | POA: Diagnosis not present

## 2021-03-03 DIAGNOSIS — E782 Mixed hyperlipidemia: Secondary | ICD-10-CM | POA: Diagnosis not present

## 2021-03-03 DIAGNOSIS — F419 Anxiety disorder, unspecified: Secondary | ICD-10-CM | POA: Diagnosis not present

## 2021-04-04 DIAGNOSIS — I10 Essential (primary) hypertension: Secondary | ICD-10-CM | POA: Diagnosis not present

## 2021-04-04 DIAGNOSIS — F419 Anxiety disorder, unspecified: Secondary | ICD-10-CM | POA: Diagnosis not present

## 2021-05-02 DIAGNOSIS — F419 Anxiety disorder, unspecified: Secondary | ICD-10-CM | POA: Diagnosis not present

## 2021-05-02 DIAGNOSIS — I10 Essential (primary) hypertension: Secondary | ICD-10-CM | POA: Diagnosis not present

## 2021-05-11 DIAGNOSIS — R7989 Other specified abnormal findings of blood chemistry: Secondary | ICD-10-CM | POA: Diagnosis not present

## 2021-05-31 DIAGNOSIS — R7989 Other specified abnormal findings of blood chemistry: Secondary | ICD-10-CM | POA: Diagnosis not present

## 2021-05-31 DIAGNOSIS — G629 Polyneuropathy, unspecified: Secondary | ICD-10-CM | POA: Diagnosis not present

## 2021-05-31 DIAGNOSIS — Z0000 Encounter for general adult medical examination without abnormal findings: Secondary | ICD-10-CM | POA: Diagnosis not present

## 2021-05-31 DIAGNOSIS — E1100 Type 2 diabetes mellitus with hyperosmolarity without nonketotic hyperglycemic-hyperosmolar coma (NKHHC): Secondary | ICD-10-CM | POA: Diagnosis not present

## 2021-05-31 DIAGNOSIS — I10 Essential (primary) hypertension: Secondary | ICD-10-CM | POA: Diagnosis not present

## 2021-05-31 DIAGNOSIS — E785 Hyperlipidemia, unspecified: Secondary | ICD-10-CM | POA: Diagnosis not present

## 2021-05-31 DIAGNOSIS — F5221 Male erectile disorder: Secondary | ICD-10-CM | POA: Diagnosis not present

## 2021-05-31 DIAGNOSIS — H547 Unspecified visual loss: Secondary | ICD-10-CM | POA: Diagnosis not present

## 2021-05-31 DIAGNOSIS — Z1211 Encounter for screening for malignant neoplasm of colon: Secondary | ICD-10-CM | POA: Diagnosis not present

## 2021-05-31 DIAGNOSIS — N401 Enlarged prostate with lower urinary tract symptoms: Secondary | ICD-10-CM | POA: Diagnosis not present

## 2021-05-31 DIAGNOSIS — F419 Anxiety disorder, unspecified: Secondary | ICD-10-CM | POA: Diagnosis not present

## 2021-05-31 DIAGNOSIS — J449 Chronic obstructive pulmonary disease, unspecified: Secondary | ICD-10-CM | POA: Diagnosis not present

## 2021-06-02 DIAGNOSIS — I10 Essential (primary) hypertension: Secondary | ICD-10-CM | POA: Diagnosis not present

## 2021-06-02 DIAGNOSIS — F419 Anxiety disorder, unspecified: Secondary | ICD-10-CM | POA: Diagnosis not present

## 2021-06-05 DIAGNOSIS — M79671 Pain in right foot: Secondary | ICD-10-CM | POA: Diagnosis not present

## 2021-06-05 DIAGNOSIS — E1140 Type 2 diabetes mellitus with diabetic neuropathy, unspecified: Secondary | ICD-10-CM | POA: Diagnosis not present

## 2021-06-05 DIAGNOSIS — B351 Tinea unguium: Secondary | ICD-10-CM | POA: Diagnosis not present

## 2021-06-07 ENCOUNTER — Encounter (INDEPENDENT_AMBULATORY_CARE_PROVIDER_SITE_OTHER): Payer: Self-pay | Admitting: *Deleted

## 2021-07-02 DIAGNOSIS — E1100 Type 2 diabetes mellitus with hyperosmolarity without nonketotic hyperglycemic-hyperosmolar coma (NKHHC): Secondary | ICD-10-CM | POA: Diagnosis not present

## 2021-07-02 DIAGNOSIS — E785 Hyperlipidemia, unspecified: Secondary | ICD-10-CM | POA: Diagnosis not present

## 2021-07-02 DIAGNOSIS — I10 Essential (primary) hypertension: Secondary | ICD-10-CM | POA: Diagnosis not present

## 2021-07-02 DIAGNOSIS — J449 Chronic obstructive pulmonary disease, unspecified: Secondary | ICD-10-CM | POA: Diagnosis not present

## 2021-08-02 DIAGNOSIS — J449 Chronic obstructive pulmonary disease, unspecified: Secondary | ICD-10-CM | POA: Diagnosis not present

## 2021-08-02 DIAGNOSIS — I10 Essential (primary) hypertension: Secondary | ICD-10-CM | POA: Diagnosis not present

## 2021-08-02 DIAGNOSIS — E785 Hyperlipidemia, unspecified: Secondary | ICD-10-CM | POA: Diagnosis not present

## 2021-08-02 DIAGNOSIS — E1100 Type 2 diabetes mellitus with hyperosmolarity without nonketotic hyperglycemic-hyperosmolar coma (NKHHC): Secondary | ICD-10-CM | POA: Diagnosis not present

## 2021-09-01 DIAGNOSIS — I10 Essential (primary) hypertension: Secondary | ICD-10-CM | POA: Diagnosis not present

## 2021-09-01 DIAGNOSIS — J449 Chronic obstructive pulmonary disease, unspecified: Secondary | ICD-10-CM | POA: Diagnosis not present

## 2021-09-01 DIAGNOSIS — E785 Hyperlipidemia, unspecified: Secondary | ICD-10-CM | POA: Diagnosis not present

## 2021-09-01 DIAGNOSIS — E1100 Type 2 diabetes mellitus with hyperosmolarity without nonketotic hyperglycemic-hyperosmolar coma (NKHHC): Secondary | ICD-10-CM | POA: Diagnosis not present

## 2021-09-08 DIAGNOSIS — G629 Polyneuropathy, unspecified: Secondary | ICD-10-CM | POA: Diagnosis not present

## 2021-09-08 DIAGNOSIS — N401 Enlarged prostate with lower urinary tract symptoms: Secondary | ICD-10-CM | POA: Diagnosis not present

## 2021-09-08 DIAGNOSIS — E785 Hyperlipidemia, unspecified: Secondary | ICD-10-CM | POA: Diagnosis not present

## 2021-09-08 DIAGNOSIS — E1100 Type 2 diabetes mellitus with hyperosmolarity without nonketotic hyperglycemic-hyperosmolar coma (NKHHC): Secondary | ICD-10-CM | POA: Diagnosis not present

## 2021-09-08 DIAGNOSIS — J449 Chronic obstructive pulmonary disease, unspecified: Secondary | ICD-10-CM | POA: Diagnosis not present

## 2021-09-08 DIAGNOSIS — I10 Essential (primary) hypertension: Secondary | ICD-10-CM | POA: Diagnosis not present

## 2021-09-08 DIAGNOSIS — F5221 Male erectile disorder: Secondary | ICD-10-CM | POA: Diagnosis not present

## 2021-09-08 DIAGNOSIS — E618 Deficiency of other specified nutrient elements: Secondary | ICD-10-CM | POA: Diagnosis not present

## 2021-09-08 DIAGNOSIS — R7989 Other specified abnormal findings of blood chemistry: Secondary | ICD-10-CM | POA: Diagnosis not present

## 2021-09-08 DIAGNOSIS — H547 Unspecified visual loss: Secondary | ICD-10-CM | POA: Diagnosis not present

## 2021-09-08 DIAGNOSIS — E876 Hypokalemia: Secondary | ICD-10-CM | POA: Diagnosis not present

## 2021-09-08 DIAGNOSIS — F419 Anxiety disorder, unspecified: Secondary | ICD-10-CM | POA: Diagnosis not present

## 2021-09-25 DIAGNOSIS — Z7984 Long term (current) use of oral hypoglycemic drugs: Secondary | ICD-10-CM | POA: Diagnosis not present

## 2021-09-25 DIAGNOSIS — B351 Tinea unguium: Secondary | ICD-10-CM | POA: Diagnosis not present

## 2021-09-25 DIAGNOSIS — E1142 Type 2 diabetes mellitus with diabetic polyneuropathy: Secondary | ICD-10-CM | POA: Diagnosis not present

## 2021-11-02 DIAGNOSIS — E1100 Type 2 diabetes mellitus with hyperosmolarity without nonketotic hyperglycemic-hyperosmolar coma (NKHHC): Secondary | ICD-10-CM | POA: Diagnosis not present

## 2021-11-02 DIAGNOSIS — I10 Essential (primary) hypertension: Secondary | ICD-10-CM | POA: Diagnosis not present

## 2021-11-02 DIAGNOSIS — E785 Hyperlipidemia, unspecified: Secondary | ICD-10-CM | POA: Diagnosis not present

## 2021-11-02 DIAGNOSIS — J449 Chronic obstructive pulmonary disease, unspecified: Secondary | ICD-10-CM | POA: Diagnosis not present

## 2021-12-02 DIAGNOSIS — I10 Essential (primary) hypertension: Secondary | ICD-10-CM | POA: Diagnosis not present

## 2021-12-02 DIAGNOSIS — E785 Hyperlipidemia, unspecified: Secondary | ICD-10-CM | POA: Diagnosis not present

## 2021-12-02 DIAGNOSIS — J449 Chronic obstructive pulmonary disease, unspecified: Secondary | ICD-10-CM | POA: Diagnosis not present

## 2021-12-02 DIAGNOSIS — E1100 Type 2 diabetes mellitus with hyperosmolarity without nonketotic hyperglycemic-hyperosmolar coma (NKHHC): Secondary | ICD-10-CM | POA: Diagnosis not present

## 2021-12-05 ENCOUNTER — Encounter (INDEPENDENT_AMBULATORY_CARE_PROVIDER_SITE_OTHER): Payer: Self-pay | Admitting: *Deleted

## 2021-12-12 DIAGNOSIS — R7989 Other specified abnormal findings of blood chemistry: Secondary | ICD-10-CM | POA: Diagnosis not present

## 2021-12-12 DIAGNOSIS — H547 Unspecified visual loss: Secondary | ICD-10-CM | POA: Diagnosis not present

## 2021-12-18 DIAGNOSIS — R7989 Other specified abnormal findings of blood chemistry: Secondary | ICD-10-CM | POA: Diagnosis not present

## 2021-12-18 DIAGNOSIS — E785 Hyperlipidemia, unspecified: Secondary | ICD-10-CM | POA: Diagnosis not present

## 2021-12-18 DIAGNOSIS — Z23 Encounter for immunization: Secondary | ICD-10-CM | POA: Diagnosis not present

## 2021-12-18 DIAGNOSIS — Z91141 Patient's other noncompliance with medication regimen due to financial hardship: Secondary | ICD-10-CM | POA: Diagnosis not present

## 2021-12-18 DIAGNOSIS — E1100 Type 2 diabetes mellitus with hyperosmolarity without nonketotic hyperglycemic-hyperosmolar coma (NKHHC): Secondary | ICD-10-CM | POA: Diagnosis not present

## 2021-12-18 DIAGNOSIS — G629 Polyneuropathy, unspecified: Secondary | ICD-10-CM | POA: Diagnosis not present

## 2021-12-18 DIAGNOSIS — E669 Obesity, unspecified: Secondary | ICD-10-CM | POA: Diagnosis not present

## 2021-12-18 DIAGNOSIS — H547 Unspecified visual loss: Secondary | ICD-10-CM | POA: Diagnosis not present

## 2021-12-18 DIAGNOSIS — E618 Deficiency of other specified nutrient elements: Secondary | ICD-10-CM | POA: Diagnosis not present

## 2021-12-18 DIAGNOSIS — I10 Essential (primary) hypertension: Secondary | ICD-10-CM | POA: Diagnosis not present

## 2021-12-18 DIAGNOSIS — Z0000 Encounter for general adult medical examination without abnormal findings: Secondary | ICD-10-CM | POA: Diagnosis not present

## 2021-12-25 DIAGNOSIS — E1142 Type 2 diabetes mellitus with diabetic polyneuropathy: Secondary | ICD-10-CM | POA: Diagnosis not present

## 2021-12-25 DIAGNOSIS — B351 Tinea unguium: Secondary | ICD-10-CM | POA: Diagnosis not present

## 2021-12-25 DIAGNOSIS — M79671 Pain in right foot: Secondary | ICD-10-CM | POA: Diagnosis not present

## 2022-03-19 DIAGNOSIS — L97422 Non-pressure chronic ulcer of left heel and midfoot with fat layer exposed: Secondary | ICD-10-CM | POA: Diagnosis not present

## 2022-03-19 DIAGNOSIS — E1142 Type 2 diabetes mellitus with diabetic polyneuropathy: Secondary | ICD-10-CM | POA: Diagnosis not present

## 2022-03-19 DIAGNOSIS — B351 Tinea unguium: Secondary | ICD-10-CM | POA: Diagnosis not present

## 2022-03-26 DIAGNOSIS — I10 Essential (primary) hypertension: Secondary | ICD-10-CM | POA: Diagnosis not present

## 2022-03-26 DIAGNOSIS — E119 Type 2 diabetes mellitus without complications: Secondary | ICD-10-CM | POA: Diagnosis not present

## 2022-03-26 DIAGNOSIS — N400 Enlarged prostate without lower urinary tract symptoms: Secondary | ICD-10-CM | POA: Diagnosis not present

## 2022-03-30 DIAGNOSIS — E669 Obesity, unspecified: Secondary | ICD-10-CM | POA: Diagnosis not present

## 2022-03-30 DIAGNOSIS — I10 Essential (primary) hypertension: Secondary | ICD-10-CM | POA: Diagnosis not present

## 2022-03-30 DIAGNOSIS — Z91141 Patient's other noncompliance with medication regimen due to financial hardship: Secondary | ICD-10-CM | POA: Diagnosis not present

## 2022-03-30 DIAGNOSIS — E1140 Type 2 diabetes mellitus with diabetic neuropathy, unspecified: Secondary | ICD-10-CM | POA: Diagnosis not present

## 2022-03-30 DIAGNOSIS — H547 Unspecified visual loss: Secondary | ICD-10-CM | POA: Diagnosis not present

## 2022-03-30 DIAGNOSIS — Z87891 Personal history of nicotine dependence: Secondary | ICD-10-CM | POA: Diagnosis not present

## 2022-03-30 DIAGNOSIS — Z79899 Other long term (current) drug therapy: Secondary | ICD-10-CM | POA: Diagnosis not present

## 2022-03-30 DIAGNOSIS — Z7984 Long term (current) use of oral hypoglycemic drugs: Secondary | ICD-10-CM | POA: Diagnosis not present

## 2022-03-30 DIAGNOSIS — Z0000 Encounter for general adult medical examination without abnormal findings: Secondary | ICD-10-CM | POA: Diagnosis not present

## 2022-03-30 DIAGNOSIS — E782 Mixed hyperlipidemia: Secondary | ICD-10-CM | POA: Diagnosis not present

## 2022-03-30 DIAGNOSIS — R5383 Other fatigue: Secondary | ICD-10-CM | POA: Diagnosis not present

## 2022-03-30 DIAGNOSIS — Z0001 Encounter for general adult medical examination with abnormal findings: Secondary | ICD-10-CM | POA: Diagnosis not present

## 2022-04-03 ENCOUNTER — Encounter (HOSPITAL_COMMUNITY): Payer: Self-pay

## 2022-04-03 ENCOUNTER — Emergency Department (HOSPITAL_COMMUNITY): Payer: PPO

## 2022-04-03 ENCOUNTER — Observation Stay (HOSPITAL_COMMUNITY): Payer: PPO

## 2022-04-03 ENCOUNTER — Inpatient Hospital Stay (HOSPITAL_COMMUNITY)
Admission: EM | Admit: 2022-04-03 | Discharge: 2022-04-07 | DRG: 638 | Disposition: A | Discharge status: Home / Self Care | Payer: PPO | Attending: Internal Medicine | Admitting: Internal Medicine

## 2022-04-03 ENCOUNTER — Other Ambulatory Visit: Payer: Self-pay

## 2022-04-03 DIAGNOSIS — E66811 Obesity, class 1: Secondary | ICD-10-CM | POA: Insufficient documentation

## 2022-04-03 DIAGNOSIS — K529 Noninfective gastroenteritis and colitis, unspecified: Secondary | ICD-10-CM | POA: Diagnosis present

## 2022-04-03 DIAGNOSIS — J449 Chronic obstructive pulmonary disease, unspecified: Secondary | ICD-10-CM | POA: Diagnosis not present

## 2022-04-03 DIAGNOSIS — Z832 Family history of diseases of the blood and blood-forming organs and certain disorders involving the immune mechanism: Secondary | ICD-10-CM

## 2022-04-03 DIAGNOSIS — K229 Disease of esophagus, unspecified: Secondary | ICD-10-CM | POA: Diagnosis not present

## 2022-04-03 DIAGNOSIS — E669 Obesity, unspecified: Secondary | ICD-10-CM | POA: Diagnosis not present

## 2022-04-03 DIAGNOSIS — E782 Mixed hyperlipidemia: Secondary | ICD-10-CM | POA: Diagnosis present

## 2022-04-03 DIAGNOSIS — E0810 Diabetes mellitus due to underlying condition with ketoacidosis without coma: Secondary | ICD-10-CM

## 2022-04-03 DIAGNOSIS — Z91199 Patient's noncompliance with other medical treatment and regimen due to unspecified reason: Secondary | ICD-10-CM | POA: Diagnosis not present

## 2022-04-03 DIAGNOSIS — Z6832 Body mass index (BMI) 32.0-32.9, adult: Secondary | ICD-10-CM

## 2022-04-03 DIAGNOSIS — F1011 Alcohol abuse, in remission: Secondary | ICD-10-CM | POA: Diagnosis present

## 2022-04-03 DIAGNOSIS — E872 Acidosis, unspecified: Secondary | ICD-10-CM | POA: Insufficient documentation

## 2022-04-03 DIAGNOSIS — R933 Abnormal findings on diagnostic imaging of other parts of digestive tract: Secondary | ICD-10-CM | POA: Diagnosis not present

## 2022-04-03 DIAGNOSIS — R079 Chest pain, unspecified: Secondary | ICD-10-CM | POA: Diagnosis not present

## 2022-04-03 DIAGNOSIS — Z1152 Encounter for screening for COVID-19: Secondary | ICD-10-CM

## 2022-04-03 DIAGNOSIS — Z833 Family history of diabetes mellitus: Secondary | ICD-10-CM

## 2022-04-03 DIAGNOSIS — Z79890 Hormone replacement therapy (postmenopausal): Secondary | ICD-10-CM

## 2022-04-03 DIAGNOSIS — K760 Fatty (change of) liver, not elsewhere classified: Secondary | ICD-10-CM | POA: Diagnosis present

## 2022-04-03 DIAGNOSIS — K7030 Alcoholic cirrhosis of liver without ascites: Secondary | ICD-10-CM

## 2022-04-03 DIAGNOSIS — R109 Unspecified abdominal pain: Secondary | ICD-10-CM | POA: Diagnosis not present

## 2022-04-03 DIAGNOSIS — E860 Dehydration: Secondary | ICD-10-CM | POA: Diagnosis not present

## 2022-04-03 DIAGNOSIS — R197 Diarrhea, unspecified: Secondary | ICD-10-CM | POA: Diagnosis not present

## 2022-04-03 DIAGNOSIS — R1111 Vomiting without nausea: Secondary | ICD-10-CM | POA: Diagnosis not present

## 2022-04-03 DIAGNOSIS — Z8042 Family history of malignant neoplasm of prostate: Secondary | ICD-10-CM | POA: Diagnosis not present

## 2022-04-03 DIAGNOSIS — E871 Hypo-osmolality and hyponatremia: Secondary | ICD-10-CM | POA: Diagnosis not present

## 2022-04-03 DIAGNOSIS — F419 Anxiety disorder, unspecified: Secondary | ICD-10-CM | POA: Diagnosis present

## 2022-04-03 DIAGNOSIS — R739 Hyperglycemia, unspecified: Secondary | ICD-10-CM | POA: Diagnosis not present

## 2022-04-03 DIAGNOSIS — Z86718 Personal history of other venous thrombosis and embolism: Secondary | ICD-10-CM | POA: Diagnosis not present

## 2022-04-03 DIAGNOSIS — Z87891 Personal history of nicotine dependence: Secondary | ICD-10-CM | POA: Diagnosis not present

## 2022-04-03 DIAGNOSIS — E876 Hypokalemia: Secondary | ICD-10-CM | POA: Diagnosis present

## 2022-04-03 DIAGNOSIS — A045 Campylobacter enteritis: Secondary | ICD-10-CM | POA: Diagnosis present

## 2022-04-03 DIAGNOSIS — J439 Emphysema, unspecified: Secondary | ICD-10-CM | POA: Diagnosis not present

## 2022-04-03 DIAGNOSIS — N179 Acute kidney failure, unspecified: Secondary | ICD-10-CM | POA: Insufficient documentation

## 2022-04-03 DIAGNOSIS — Z79899 Other long term (current) drug therapy: Secondary | ICD-10-CM

## 2022-04-03 DIAGNOSIS — Z7951 Long term (current) use of inhaled steroids: Secondary | ICD-10-CM

## 2022-04-03 DIAGNOSIS — R112 Nausea with vomiting, unspecified: Principal | ICD-10-CM

## 2022-04-03 DIAGNOSIS — Z9049 Acquired absence of other specified parts of digestive tract: Secondary | ICD-10-CM | POA: Diagnosis not present

## 2022-04-03 DIAGNOSIS — Z825 Family history of asthma and other chronic lower respiratory diseases: Secondary | ICD-10-CM

## 2022-04-03 DIAGNOSIS — E1110 Type 2 diabetes mellitus with ketoacidosis without coma: Secondary | ICD-10-CM | POA: Diagnosis not present

## 2022-04-03 DIAGNOSIS — Z7982 Long term (current) use of aspirin: Secondary | ICD-10-CM

## 2022-04-03 DIAGNOSIS — F1010 Alcohol abuse, uncomplicated: Secondary | ICD-10-CM | POA: Diagnosis not present

## 2022-04-03 DIAGNOSIS — R945 Abnormal results of liver function studies: Secondary | ICD-10-CM | POA: Diagnosis not present

## 2022-04-03 DIAGNOSIS — Z7984 Long term (current) use of oral hypoglycemic drugs: Secondary | ICD-10-CM

## 2022-04-03 DIAGNOSIS — R110 Nausea: Secondary | ICD-10-CM | POA: Diagnosis not present

## 2022-04-03 DIAGNOSIS — Z801 Family history of malignant neoplasm of trachea, bronchus and lung: Secondary | ICD-10-CM

## 2022-04-03 DIAGNOSIS — R000 Tachycardia, unspecified: Secondary | ICD-10-CM | POA: Diagnosis not present

## 2022-04-03 LAB — CBC
HCT: 56.3 % — ABNORMAL HIGH (ref 39.0–52.0)
Hemoglobin: 18.6 g/dL — ABNORMAL HIGH (ref 13.0–17.0)
MCH: 33.6 pg (ref 26.0–34.0)
MCHC: 33 g/dL (ref 30.0–36.0)
MCV: 101.8 fL — ABNORMAL HIGH (ref 80.0–100.0)
Platelets: 277 10*3/uL (ref 150–400)
RBC: 5.53 MIL/uL (ref 4.22–5.81)
RDW: 13.7 % (ref 11.5–15.5)
WBC: 26.2 10*3/uL — ABNORMAL HIGH (ref 4.0–10.5)
nRBC: 0 % (ref 0.0–0.2)

## 2022-04-03 LAB — BETA-HYDROXYBUTYRIC ACID
Beta-Hydroxybutyric Acid: >8 mmol/L — ABNORMAL HIGH (ref 0.05–0.27)
Beta-Hydroxybutyric Acid: 7.7 mmol/L — ABNORMAL HIGH (ref 0.05–0.27)
Beta-Hydroxybutyric Acid: 4.48 mmol/L — ABNORMAL HIGH (ref 0.05–0.27)
Beta-Hydroxybutyric Acid: 2.1 mmol/L — ABNORMAL HIGH (ref 0.05–0.27)
Beta-Hydroxybutyric Acid: 2.54 mmol/L — ABNORMAL HIGH (ref 0.05–0.27)

## 2022-04-03 LAB — URINALYSIS, W/ REFLEX TO CULTURE (INFECTION SUSPECTED)
Bacteria, UA: NONE SEEN
Bilirubin Urine: NEGATIVE
Glucose, UA: >=500 mg/dL — AB
Ketones, ur: 80 mg/dL — AB
Leukocytes,Ua: NEGATIVE
Nitrite: NEGATIVE
Protein, ur: 30 mg/dL — AB
Specific Gravity, Urine: 1.022 (ref 1.005–1.030)
pH: 5 (ref 5.0–8.0)

## 2022-04-03 LAB — BASIC METABOLIC PANEL
Anion gap: 28 — ABNORMAL HIGH (ref 5–15)
Anion gap: 30 — ABNORMAL HIGH (ref 5–15)
Anion gap: 19 — ABNORMAL HIGH (ref 5–15)
Anion gap: 18 — ABNORMAL HIGH (ref 5–15)
Anion gap: 11 (ref 5–15)
Anion gap: 11 (ref 5–15)
BUN: 32 mg/dL — ABNORMAL HIGH (ref 8–23)
BUN: 32 mg/dL — ABNORMAL HIGH (ref 8–23)
BUN: 28 mg/dL — ABNORMAL HIGH (ref 8–23)
BUN: 27 mg/dL — ABNORMAL HIGH (ref 8–23)
BUN: 25 mg/dL — ABNORMAL HIGH (ref 8–23)
BUN: 23 mg/dL (ref 8–23)
CO2: 14 mmol/L — ABNORMAL LOW (ref 22–32)
CO2: 11 mmol/L — ABNORMAL LOW (ref 22–32)
CO2: 13 mmol/L — ABNORMAL LOW (ref 22–32)
CO2: 14 mmol/L — ABNORMAL LOW (ref 22–32)
CO2: 18 mmol/L — ABNORMAL LOW (ref 22–32)
CO2: 19 mmol/L — ABNORMAL LOW (ref 22–32)
Calcium: 8.3 mg/dL — ABNORMAL LOW (ref 8.9–10.3)
Calcium: 8.4 mg/dL — ABNORMAL LOW (ref 8.9–10.3)
Calcium: 7.7 mg/dL — ABNORMAL LOW (ref 8.9–10.3)
Calcium: 7.7 mg/dL — ABNORMAL LOW (ref 8.9–10.3)
Calcium: 7.8 mg/dL — ABNORMAL LOW (ref 8.9–10.3)
Calcium: 8 mg/dL — ABNORMAL LOW (ref 8.9–10.3)
Chloride: 91 mmol/L — ABNORMAL LOW (ref 98–111)
Chloride: 91 mmol/L — ABNORMAL LOW (ref 98–111)
Chloride: 98 mmol/L (ref 98–111)
Chloride: 98 mmol/L (ref 98–111)
Chloride: 100 mmol/L (ref 98–111)
Chloride: 100 mmol/L (ref 98–111)
Creatinine, Ser: 1.77 mg/dL — ABNORMAL HIGH (ref 0.61–1.24)
Creatinine, Ser: 1.66 mg/dL — ABNORMAL HIGH (ref 0.61–1.24)
Creatinine, Ser: 1.36 mg/dL — ABNORMAL HIGH (ref 0.61–1.24)
Creatinine, Ser: 1.33 mg/dL — ABNORMAL HIGH (ref 0.61–1.24)
Creatinine, Ser: 1.04 mg/dL (ref 0.61–1.24)
Creatinine, Ser: 0.97 mg/dL (ref 0.61–1.24)
GFR, Estimated: 41 mL/min — ABNORMAL LOW (ref >60)
GFR, Estimated: 45 mL/min — ABNORMAL LOW (ref >60)
GFR, Estimated: 57 mL/min — ABNORMAL LOW (ref >60)
GFR, Estimated: 58 mL/min — ABNORMAL LOW (ref >60)
GFR, Estimated: >60 mL/min (ref >60)
GFR, Estimated: >60 mL/min (ref >60)
Glucose, Bld: 377 mg/dL — ABNORMAL HIGH (ref 70–99)
Glucose, Bld: 322 mg/dL — ABNORMAL HIGH (ref 70–99)
Glucose, Bld: 186 mg/dL — ABNORMAL HIGH (ref 70–99)
Glucose, Bld: 177 mg/dL — ABNORMAL HIGH (ref 70–99)
Glucose, Bld: 126 mg/dL — ABNORMAL HIGH (ref 70–99)
Glucose, Bld: 149 mg/dL — ABNORMAL HIGH (ref 70–99)
Potassium: 3.7 mmol/L (ref 3.5–5.1)
Potassium: 3.8 mmol/L (ref 3.5–5.1)
Potassium: 3.5 mmol/L (ref 3.5–5.1)
Potassium: 3.6 mmol/L (ref 3.5–5.1)
Potassium: 3.4 mmol/L — ABNORMAL LOW (ref 3.5–5.1)
Potassium: 3.4 mmol/L — ABNORMAL LOW (ref 3.5–5.1)
Sodium: 133 mmol/L — ABNORMAL LOW (ref 135–145)
Sodium: 132 mmol/L — ABNORMAL LOW (ref 135–145)
Sodium: 130 mmol/L — ABNORMAL LOW (ref 135–145)
Sodium: 130 mmol/L — ABNORMAL LOW (ref 135–145)
Sodium: 129 mmol/L — ABNORMAL LOW (ref 135–145)
Sodium: 130 mmol/L — ABNORMAL LOW (ref 135–145)

## 2022-04-03 LAB — COMPREHENSIVE METABOLIC PANEL
ALT: <5 U/L (ref 0–44)
AST: 80 U/L — ABNORMAL HIGH (ref 15–41)
Albumin: 4.1 g/dL (ref 3.5–5.0)
Alkaline Phosphatase: 120 U/L (ref 38–126)
Anion gap: 38 — ABNORMAL HIGH (ref 5–15)
BUN: 30 mg/dL — ABNORMAL HIGH (ref 8–23)
CO2: 10 mmol/L — ABNORMAL LOW (ref 22–32)
Calcium: 8.9 mg/dL (ref 8.9–10.3)
Chloride: 84 mmol/L — ABNORMAL LOW (ref 98–111)
Creatinine, Ser: 2.07 mg/dL — ABNORMAL HIGH (ref 0.61–1.24)
GFR, Estimated: 34 mL/min — ABNORMAL LOW (ref >60)
Glucose, Bld: 411 mg/dL — ABNORMAL HIGH (ref 70–99)
Potassium: 2.7 mmol/L — CL (ref 3.5–5.1)
Sodium: 132 mmol/L — ABNORMAL LOW (ref 135–145)
Total Bilirubin: 2.4 mg/dL — ABNORMAL HIGH (ref 0.3–1.2)
Total Protein: 9.7 g/dL — ABNORMAL HIGH (ref 6.5–8.1)

## 2022-04-03 LAB — VITAMIN B12: Vitamin B-12: 550 pg/mL (ref 180–914)

## 2022-04-03 LAB — BLOOD GAS, VENOUS
Acid-base deficit: 13.5 mmol/L — ABNORMAL HIGH (ref 0.0–2.0)
Bicarbonate: 14.6 mmol/L — ABNORMAL LOW (ref 20.0–28.0)
Drawn by: 1528
O2 Saturation: 56.6 %
Patient temperature: 36.4
pCO2, Ven: 40 mmHg — ABNORMAL LOW (ref 44–60)
pH, Ven: 7.17 — CL (ref 7.25–7.43)
pO2, Ven: 35 mmHg (ref 32–45)

## 2022-04-03 LAB — CBG MONITORING, ED
Glucose-Capillary: 390 mg/dL — ABNORMAL HIGH (ref 70–99)
Glucose-Capillary: 344 mg/dL — ABNORMAL HIGH (ref 70–99)
Glucose-Capillary: 233 mg/dL — ABNORMAL HIGH (ref 70–99)
Glucose-Capillary: 190 mg/dL — ABNORMAL HIGH (ref 70–99)
Glucose-Capillary: 190 mg/dL — ABNORMAL HIGH (ref 70–99)
Glucose-Capillary: 191 mg/dL — ABNORMAL HIGH (ref 70–99)
Glucose-Capillary: 158 mg/dL — ABNORMAL HIGH (ref 70–99)

## 2022-04-03 LAB — GLUCOSE, CAPILLARY
Glucose-Capillary: 128 mg/dL — ABNORMAL HIGH (ref 70–99)
Glucose-Capillary: 131 mg/dL — ABNORMAL HIGH (ref 70–99)
Glucose-Capillary: 138 mg/dL — ABNORMAL HIGH (ref 70–99)
Glucose-Capillary: 140 mg/dL — ABNORMAL HIGH (ref 70–99)
Glucose-Capillary: 153 mg/dL — ABNORMAL HIGH (ref 70–99)
Glucose-Capillary: 156 mg/dL — ABNORMAL HIGH (ref 70–99)
Glucose-Capillary: 169 mg/dL — ABNORMAL HIGH (ref 70–99)
Glucose-Capillary: 219 mg/dL — ABNORMAL HIGH (ref 70–99)

## 2022-04-03 LAB — MAGNESIUM: Magnesium: 2.7 mg/dL — ABNORMAL HIGH (ref 1.7–2.4)

## 2022-04-03 LAB — RAPID URINE DRUG SCREEN, HOSP PERFORMED
Amphetamines: NOT DETECTED
Barbiturates: NOT DETECTED
Benzodiazepines: POSITIVE — AB
Cocaine: NOT DETECTED
Opiates: NOT DETECTED
Tetrahydrocannabinol: NOT DETECTED

## 2022-04-03 LAB — PROCALCITONIN: Procalcitonin: 0.19 ng/mL

## 2022-04-03 LAB — LACTIC ACID, PLASMA: Lactic Acid, Venous: 5.4 mmol/L (ref 0.5–1.9)

## 2022-04-03 LAB — RESP PANEL BY RT-PCR (RSV, FLU A&B, COVID)  RVPGX2
Influenza A by PCR: NEGATIVE
Influenza B by PCR: NEGATIVE
Resp Syncytial Virus by PCR: NEGATIVE
SARS Coronavirus 2 by RT PCR: NEGATIVE

## 2022-04-03 LAB — FOLATE: Folate: 10.7 ng/mL (ref >5.9)

## 2022-04-03 LAB — BILIRUBIN, FRACTIONATED(TOT/DIR/INDIR)
Bilirubin, Direct: 0.3 mg/dL — ABNORMAL HIGH (ref 0.0–0.2)
Indirect Bilirubin: 2.1 mg/dL — ABNORMAL HIGH (ref 0.3–0.9)
Total Bilirubin: 2.4 mg/dL — ABNORMAL HIGH (ref 0.3–1.2)

## 2022-04-03 LAB — HEMOGLOBIN A1C
Hgb A1c MFr Bld: 13.1 % — ABNORMAL HIGH (ref 4.8–5.6)
Mean Plasma Glucose: 329.27 mg/dL

## 2022-04-03 LAB — AMMONIA: Ammonia: 43 umol/L — ABNORMAL HIGH (ref 9–35)

## 2022-04-03 LAB — HEPATITIS B SURFACE ANTIGEN: Hepatitis B Surface Ag: NONREACTIVE

## 2022-04-03 LAB — TROPONIN I (HIGH SENSITIVITY)
Troponin I (High Sensitivity): 12 ng/L (ref <18)
Troponin I (High Sensitivity): 11 ng/L (ref <18)
Troponin I (High Sensitivity): 11 ng/L (ref <18)

## 2022-04-03 LAB — PROTIME-INR
INR: 1.2 (ref 0.8–1.2)
Prothrombin Time: 15 seconds (ref 11.4–15.2)

## 2022-04-03 LAB — HEPATITIS C ANTIBODY: HCV Ab: NONREACTIVE

## 2022-04-03 LAB — HIV ANTIBODY (ROUTINE TESTING W REFLEX): HIV Screen 4th Generation wRfx: NONREACTIVE

## 2022-04-03 LAB — MRSA NEXT GEN BY PCR, NASAL: MRSA by PCR Next Gen: DETECTED — AB

## 2022-04-03 LAB — LIPASE, BLOOD: Lipase: 29 U/L (ref 11–51)

## 2022-04-03 MED ORDER — PROPRANOLOL HCL 20 MG PO TABS
10.0000 mg | ORAL_TABLET | Freq: Two times a day (BID) | ORAL | Status: DC
  Administered 2022-04-03 – 2022-04-07 (×9): 10 mg via ORAL
  Filled 2022-04-03 (×9): qty 1

## 2022-04-03 MED ORDER — LACTULOSE 10 GM/15ML PO SOLN
20.0000 g | Freq: Two times a day (BID) | ORAL | Status: DC
  Administered 2022-04-03 – 2022-04-07 (×8): 20 g via ORAL
  Filled 2022-04-03 (×8): qty 30

## 2022-04-03 MED ORDER — MUPIROCIN 2 % EX OINT
1.0000 | TOPICAL_OINTMENT | Freq: Two times a day (BID) | CUTANEOUS | Status: DC
  Administered 2022-04-03 – 2022-04-07 (×8): 1 via NASAL
  Filled 2022-04-03: qty 22

## 2022-04-03 MED ORDER — POTASSIUM CHLORIDE CRYS ER 20 MEQ PO TBCR
40.0000 meq | EXTENDED_RELEASE_TABLET | Freq: Once | ORAL | Status: AC
  Administered 2022-04-03: 40 meq via ORAL
  Filled 2022-04-03: qty 2

## 2022-04-03 MED ORDER — VITAMIN B-12 1000 MCG PO TABS
1000.0000 ug | ORAL_TABLET | Freq: Every day | ORAL | Status: DC
  Administered 2022-04-03 – 2022-04-07 (×5): 1000 ug via ORAL
  Filled 2022-04-03 (×5): qty 1

## 2022-04-03 MED ORDER — LACTATED RINGERS IV BOLUS
20.0000 mL/kg | Freq: Once | INTRAVENOUS | Status: AC
  Administered 2022-04-03: 1814 mL via INTRAVENOUS

## 2022-04-03 MED ORDER — ENOXAPARIN SODIUM 40 MG/0.4ML IJ SOSY
40.0000 mg | PREFILLED_SYRINGE | INTRAMUSCULAR | Status: DC
  Administered 2022-04-03 – 2022-04-07 (×5): 40 mg via SUBCUTANEOUS
  Filled 2022-04-03 (×5): qty 0.4

## 2022-04-03 MED ORDER — PROCHLORPERAZINE EDISYLATE 10 MG/2ML IJ SOLN
INTRAMUSCULAR | Status: AC
  Filled 2022-04-03: qty 2

## 2022-04-03 MED ORDER — LACTATED RINGERS IV SOLN: INTRAVENOUS | Status: DC

## 2022-04-03 MED ORDER — DEXTROSE IN LACTATED RINGERS 5 % IV SOLN: INTRAVENOUS | Status: DC

## 2022-04-03 MED ORDER — DULOXETINE HCL 30 MG PO CPEP
30.0000 mg | ORAL_CAPSULE | Freq: Two times a day (BID) | ORAL | Status: DC
  Administered 2022-04-03 – 2022-04-07 (×9): 30 mg via ORAL
  Filled 2022-04-03 (×10): qty 1

## 2022-04-03 MED ORDER — PREGABALIN 50 MG PO CAPS
50.0000 mg | ORAL_CAPSULE | Freq: Two times a day (BID) | ORAL | Status: DC
  Administered 2022-04-03 – 2022-04-07 (×9): 50 mg via ORAL
  Filled 2022-04-03: qty 2
  Filled 2022-04-03 (×2): qty 1
  Filled 2022-04-03 (×4): qty 2
  Filled 2022-04-03 (×2): qty 1

## 2022-04-03 MED ORDER — POTASSIUM CHLORIDE 10 MEQ/100ML IV SOLN
10.0000 meq | INTRAVENOUS | Status: AC
  Administered 2022-04-03 (×2): 10 meq via INTRAVENOUS
  Filled 2022-04-03 (×2): qty 100

## 2022-04-03 MED ORDER — INSULIN REGULAR(HUMAN) IN NACL 100-0.9 UT/100ML-% IV SOLN: INTRAVENOUS | Status: DC

## 2022-04-03 MED ORDER — METOCLOPRAMIDE HCL 5 MG/ML IJ SOLN
10.0000 mg | Freq: Once | INTRAMUSCULAR | Status: AC
  Administered 2022-04-03: 10 mg via INTRAVENOUS
  Filled 2022-04-03: qty 2

## 2022-04-03 MED ORDER — POTASSIUM CHLORIDE CRYS ER 20 MEQ PO TBCR
20.0000 meq | EXTENDED_RELEASE_TABLET | Freq: Once | ORAL | Status: AC
  Administered 2022-04-03: 20 meq via ORAL
  Filled 2022-04-03: qty 1

## 2022-04-03 MED ORDER — LACTATED RINGERS IV BOLUS
20.0000 mL/kg | Freq: Once | INTRAVENOUS | Status: AC
  Administered 2022-04-03: 1000 mL via INTRAVENOUS

## 2022-04-03 MED ORDER — PROCHLORPERAZINE EDISYLATE 10 MG/2ML IJ SOLN
10.0000 mg | Freq: Four times a day (QID) | INTRAMUSCULAR | Status: DC | PRN
  Administered 2022-04-03: 10 mg via INTRAVENOUS

## 2022-04-03 MED ORDER — INSULIN REGULAR(HUMAN) IN NACL 100-0.9 UT/100ML-% IV SOLN
INTRAVENOUS | Status: DC
  Administered 2022-04-03: 13 [IU]/h via INTRAVENOUS
  Filled 2022-04-03: qty 100

## 2022-04-03 MED ORDER — CHLORHEXIDINE GLUCONATE CLOTH 2 % EX PADS
6.0000 | MEDICATED_PAD | Freq: Every day | CUTANEOUS | Status: DC
  Administered 2022-04-04 – 2022-04-05 (×2): 6 via TOPICAL

## 2022-04-03 MED ORDER — SODIUM CHLORIDE 0.9 % IV BOLUS
1000.0000 mL | Freq: Once | INTRAVENOUS | Status: AC
  Administered 2022-04-03: 1000 mL via INTRAVENOUS

## 2022-04-03 MED ORDER — DEXTROSE 50 % IV SOLN: 0.0000 mL | INTRAVENOUS | Status: DC | PRN

## 2022-04-03 MED ORDER — PANTOPRAZOLE SODIUM 40 MG IV SOLR
40.0000 mg | Freq: Two times a day (BID) | INTRAVENOUS | Status: DC
  Administered 2022-04-03 – 2022-04-07 (×9): 40 mg via INTRAVENOUS
  Filled 2022-04-03 (×9): qty 10

## 2022-04-03 MED ORDER — ASPIRIN 81 MG PO TBEC
81.0000 mg | DELAYED_RELEASE_TABLET | Freq: Every day | ORAL | Status: DC
  Administered 2022-04-03 – 2022-04-07 (×5): 81 mg via ORAL
  Filled 2022-04-03 (×5): qty 1

## 2022-04-03 MED ORDER — POTASSIUM CHLORIDE 10 MEQ/100ML IV SOLN
10.0000 meq | INTRAVENOUS | Status: AC
  Administered 2022-04-03 (×4): 10 meq via INTRAVENOUS
  Filled 2022-04-03 (×4): qty 100

## 2022-04-03 MED ORDER — ONDANSETRON HCL 4 MG/2ML IJ SOLN
4.0000 mg | Freq: Once | INTRAMUSCULAR | Status: AC
  Administered 2022-04-03: 4 mg via INTRAVENOUS
  Filled 2022-04-03: qty 2

## 2022-04-03 MED ORDER — BUSPIRONE HCL 5 MG PO TABS
15.0000 mg | ORAL_TABLET | Freq: Three times a day (TID) | ORAL | Status: DC
  Administered 2022-04-03 – 2022-04-07 (×13): 15 mg via ORAL
  Filled 2022-04-03 (×13): qty 3

## 2022-04-03 NOTE — ED Notes (Signed)
Echo at bedside

## 2022-04-03 NOTE — ED Triage Notes (Signed)
Pt presents with N/V that started today. Has not felt good since Friday. Pt HR 115 on monitor. Possible dehydration

## 2022-04-03 NOTE — ED Provider Notes (Signed)
[facility] Emergency Department Provider Note MRN:  [id number]  Arrival date & time: 04/04/22     Chief Complaint   Emesis   History of Present Illness   Wesley Ho is a 67 y.o. year-old male with a history of COPD, diabetes presenting to the ED with chief complaint of emesis.  Persistent nausea vomiting and diarrhea for the past 3 days.  Also endorsing some occasional abdominal pain, chest pain.  Denies fever.  Feeling very weak and dehydrated.  Review of Systems  A thorough review of systems was obtained and all systems are negative except as noted in the HPI and PMH.   Patient's Health History    Past Medical History:  Diagnosis Date   Anxiety    Arthritis    Bronchitis    Chronic diarrhea    Chronic edema of lower extremity 09/28/2010   COPD (chronic obstructive pulmonary disease) (HCC)    Diabetes mellitus without complication (HCC)    DVT (deep venous thrombosis) (HCC) 09/28/2010   Emphysema of lung (HCC)    Fatigue    H/O Thrombocytopenia 09/28/2010   Left radial fracture    Multiple lung nodules 09/28/2010   Neuropathy     Past Surgical History:  Procedure Laterality Date   CATARACT EXTRACTION W/PHACO Right 01/17/2016   Procedure: CATARACT EXTRACTION PHACO AND INTRAOCULAR LENS PLACEMENT (IOC);  Surgeon: Jethro Bolus, MD;  Location: [facility];  Service: Ophthalmology;  Laterality: Right;  CDE: 4.34   CATARACT EXTRACTION W/PHACO Left 01/31/2016   Procedure: CATARACT EXTRACTION PHACO AND INTRAOCULAR LENS PLACEMENT (IOC);  Surgeon: Jethro Bolus, MD;  Location: [facility];  Service: Ophthalmology;  Laterality: Left;  CDE: 5.69   CHOLECYSTECTOMY     MOUTH SURGERY      Family History  Problem Relation Age of Onset   Diabetes Mother    Clotting disorder Father    Alcohol abuse Maternal Uncle    Lung cancer Maternal Grandfather    Emphysema Paternal Grandfather     Social History   Socioeconomic History   Marital status: Divorced    Spouse  name: Not on file   Number of children: 2   Years of education: Not on file   Highest education level: Not on file  Occupational History   Occupation: Retired  Tobacco Use   Smoking status: Former    Packs/day: 1.00    Years: 40.00    Total pack years: 40.00    Types: Cigarettes    Quit date: 01/11/2013    Years since quitting: 9.2   Smokeless tobacco: Never  Vaping Use   Vaping Use: Never used  Substance and Sexual Activity   Alcohol use: No    Comment: stopped drinking 2016.   Drug use: No   Sexual activity: Not Currently    Birth control/protection: None  Other Topics Concern   Not on file  Social History Narrative   Not on file   Social Determinants of Health   Financial Resource Strain: Not on file  Food Insecurity: No Food Insecurity (04/03/2022)   Hunger Vital Sign    Worried About Running Out of Food in the Last Year: Never true    Ran Out of Food in the Last Year: Never true  Transportation Needs: No Transportation Needs (04/03/2022)   PRAPARE - Administrator, Civil Service (Medical): No    Lack of Transportation (Non-Medical): No  Physical Activity: Not on file  Stress: Not on file  Social Connections:  Not on file  Intimate Partner Violence: Not At Risk (04/03/2022)   Humiliation, Afraid, Rape, and Kick questionnaire    Fear of Current or Ex-Partner: No    Emotionally Abused: No    Physically Abused: No    Sexually Abused: No     Physical Exam   Vitals:   04/04/22 0500 04/04/22 0724  BP: (!) 140/68   Pulse: 75   Resp: 15   Temp:  97.9 F (36.6 C)  SpO2: 95%     CONSTITUTIONAL: Chronically ill-appearing, NAD NEURO/PSYCH:  Alert and oriented x 3, no focal deficits EYES:  eyes equal and reactive ENT/NECK:  no LAD, no JVD CARDIO: Tachycardic rate, well-perfused, normal S1 and S2 PULM:  CTAB no wheezing or rhonchi GI/GU:  non-distended, non-tender MSK/SPINE:  No gross deformities, no edema SKIN:  no rash, atraumatic   *Additional  and/or pertinent findings included in MDM below  Diagnostic and Interventional Summary    EKG Interpretation  Date/Time:  Tuesday April 03 2022 03:20:37 EST Ventricular Rate:  123 PR Interval:  153 QRS Duration: 102 QT Interval:  304 QTC Calculation: 435 R Axis:   82 Text Interpretation: Sinus tachycardia Borderline right axis deviation Low voltage, precordial leads Abnormal T, consider ischemia, diffuse leads Confirmed by Gerlene Fee [phone]) on 04/03/2022 4:07:06 AM       Labs Reviewed  MRSA NEXT GEN BY PCR, NASAL - Abnormal; Notable for the following components:      Result Value   MRSA by PCR Next Gen DETECTED (*)    All other components within normal limits  CBC - Abnormal; Notable for the following components:   WBC 26.2 (*)    Hemoglobin 18.6 (*)    HCT 56.3 (*)    MCV 101.8 (*)    All other components within normal limits  COMPREHENSIVE METABOLIC PANEL - Abnormal; Notable for the following components:   Sodium 132 (*)    Potassium 2.7 (*)    Chloride 84 (*)    CO2 10 (*)    Glucose, Bld 411 (*)    BUN 30 (*)    Creatinine, Ser 2.07 (*)    Total Protein 9.7 (*)    AST 80 (*)    Total Bilirubin 2.4 (*)    GFR, Estimated 34 (*)    Anion gap 38 (*)    All other components within normal limits  BASIC METABOLIC PANEL - Abnormal; Notable for the following components:   Sodium 133 (*)    Chloride 91 (*)    CO2 14 (*)    Glucose, Bld 377 (*)    BUN 32 (*)    Creatinine, Ser 1.77 (*)    Calcium 8.3 (*)    GFR, Estimated 41 (*)    Anion gap 28 (*)    All other components within normal limits  BASIC METABOLIC PANEL - Abnormal; Notable for the following components:   Sodium 132 (*)    Chloride 91 (*)    CO2 11 (*)    Glucose, Bld 322 (*)    BUN 32 (*)    Creatinine, Ser 1.66 (*)    Calcium 8.4 (*)    GFR, Estimated 45 (*)    Anion gap 30 (*)    All other components within normal limits  BASIC METABOLIC PANEL - Abnormal; Notable for the following  components:   Sodium 130 (*)    CO2 14 (*)    Glucose, Bld 177 (*)    BUN 27 (*)  Creatinine, Ser 1.33 (*)    Calcium 7.7 (*)    GFR, Estimated 58 (*)    Anion gap 18 (*)    All other components within normal limits  BETA-HYDROXYBUTYRIC ACID - Abnormal; Notable for the following components:   Beta-Hydroxybutyric Acid >8.00 (*)    All other components within normal limits  BETA-HYDROXYBUTYRIC ACID - Abnormal; Notable for the following components:   Beta-Hydroxybutyric Acid 4.48 (*)    All other components within normal limits  BETA-HYDROXYBUTYRIC ACID - Abnormal; Notable for the following components:   Beta-Hydroxybutyric Acid 2.54 (*)    All other components within normal limits  BLOOD GAS, VENOUS - Abnormal; Notable for the following components:   pH, Ven 7.17 (*)    pCO2, Ven 40 (*)    Bicarbonate 14.6 (*)    Acid-base deficit 13.5 (*)    All other components within normal limits  LACTIC ACID, PLASMA - Abnormal; Notable for the following components:   Lactic Acid, Venous 5.4 (*)    All other components within normal limits  RAPID URINE DRUG SCREEN, HOSP PERFORMED - Abnormal; Notable for the following components:   Benzodiazepines POSITIVE (*)    All other components within normal limits  URINALYSIS, W/ REFLEX TO CULTURE (INFECTION SUSPECTED) - Abnormal; Notable for the following components:   Glucose, UA >=500 (*)    Hgb urine dipstick SMALL (*)    Ketones, ur 80 (*)    Protein, ur 30 (*)    All other components within normal limits  HEMOGLOBIN A1C - Abnormal; Notable for the following components:   Hgb A1c MFr Bld 13.1 (*)    All other components within normal limits  MAGNESIUM - Abnormal; Notable for the following components:   Magnesium 2.7 (*)    All other components within normal limits  BILIRUBIN, FRACTIONATED(TOT/DIR/INDIR) - Abnormal; Notable for the following components:   Total Bilirubin 2.4 (*)    Bilirubin, Direct 0.3 (*)    Indirect Bilirubin 2.1 (*)     All other components within normal limits  BASIC METABOLIC PANEL - Abnormal; Notable for the following components:   Sodium 130 (*)    CO2 13 (*)    Glucose, Bld 186 (*)    BUN 28 (*)    Creatinine, Ser 1.36 (*)    Calcium 7.7 (*)    GFR, Estimated 57 (*)    Anion gap 19 (*)    All other components within normal limits  BASIC METABOLIC PANEL - Abnormal; Notable for the following components:   Sodium 129 (*)    Potassium 3.4 (*)    CO2 18 (*)    Glucose, Bld 126 (*)    BUN 25 (*)    Calcium 7.8 (*)    All other components within normal limits  BASIC METABOLIC PANEL - Abnormal; Notable for the following components:   Sodium 130 (*)    Potassium 3.4 (*)    CO2 19 (*)    Glucose, Bld 149 (*)    Calcium 8.0 (*)    All other components within normal limits  BETA-HYDROXYBUTYRIC ACID - Abnormal; Notable for the following components:   Beta-Hydroxybutyric Acid 7.70 (*)    All other components within normal limits  BETA-HYDROXYBUTYRIC ACID - Abnormal; Notable for the following components:   Beta-Hydroxybutyric Acid 2.10 (*)    All other components within normal limits  BETA-HYDROXYBUTYRIC ACID - Abnormal; Notable for the following components:   Beta-Hydroxybutyric Acid 1.59 (*)    All other components  within normal limits  AMMONIA - Abnormal; Notable for the following components:   Ammonia 43 (*)    All other components within normal limits  GLUCOSE, CAPILLARY - Abnormal; Notable for the following components:   Glucose-Capillary 153 (*)    All other components within normal limits  COMPREHENSIVE METABOLIC PANEL - Abnormal; Notable for the following components:   Sodium 133 (*)    Potassium 3.4 (*)    CO2 21 (*)    Glucose, Bld 134 (*)    Calcium 8.1 (*)    Total Protein 6.4 (*)    Albumin 2.8 (*)    All other components within normal limits  CBC - Abnormal; Notable for the following components:   WBC 20.1 (*)    RBC 4.08 (*)    MCV 100.2 (*)    All other components within  normal limits  GLUCOSE, CAPILLARY - Abnormal; Notable for the following components:   Glucose-Capillary 128 (*)    All other components within normal limits  GLUCOSE, CAPILLARY - Abnormal; Notable for the following components:   Glucose-Capillary 140 (*)    All other components within normal limits  GLUCOSE, CAPILLARY - Abnormal; Notable for the following components:   Glucose-Capillary 169 (*)    All other components within normal limits  BASIC METABOLIC PANEL - Abnormal; Notable for the following components:   Sodium 132 (*)    Potassium 3.1 (*)    CO2 20 (*)    Glucose, Bld 139 (*)    Calcium 7.9 (*)    All other components within normal limits  GLUCOSE, CAPILLARY - Abnormal; Notable for the following components:   Glucose-Capillary 131 (*)    All other components within normal limits  GLUCOSE, CAPILLARY - Abnormal; Notable for the following components:   Glucose-Capillary 138 (*)    All other components within normal limits  GLUCOSE, CAPILLARY - Abnormal; Notable for the following components:   Glucose-Capillary 156 (*)    All other components within normal limits  GLUCOSE, CAPILLARY - Abnormal; Notable for the following components:   Glucose-Capillary 219 (*)    All other components within normal limits  BETA-HYDROXYBUTYRIC ACID - Abnormal; Notable for the following components:   Beta-Hydroxybutyric Acid 2.79 (*)    All other components within normal limits  GLUCOSE, CAPILLARY - Abnormal; Notable for the following components:   Glucose-Capillary 124 (*)    All other components within normal limits  GLUCOSE, CAPILLARY - Abnormal; Notable for the following components:   Glucose-Capillary 150 (*)    All other components within normal limits  GLUCOSE, CAPILLARY - Abnormal; Notable for the following components:   Glucose-Capillary 150 (*)    All other components within normal limits  GLUCOSE, CAPILLARY - Abnormal; Notable for the following components:   Glucose-Capillary 143  (*)    All other components within normal limits  CBG MONITORING, ED - Abnormal; Notable for the following components:   Glucose-Capillary 390 (*)    All other components within normal limits  CBG MONITORING, ED - Abnormal; Notable for the following components:   Glucose-Capillary 344 (*)    All other components within normal limits  CBG MONITORING, ED - Abnormal; Notable for the following components:   Glucose-Capillary 233 (*)    All other components within normal limits  CBG MONITORING, ED - Abnormal; Notable for the following components:   Glucose-Capillary 190 (*)    All other components within normal limits  CBG MONITORING, ED - Abnormal; Notable for the following components:  Glucose-Capillary 190 (*)    All other components within normal limits  CBG MONITORING, ED - Abnormal; Notable for the following components:   Glucose-Capillary 191 (*)    All other components within normal limits  CBG MONITORING, ED - Abnormal; Notable for the following components:   Glucose-Capillary 158 (*)    All other components within normal limits  RESP PANEL BY RT-PCR (RSV, FLU A&B, COVID)  [id number]  CULTURE, BLOOD (ROUTINE X 2)  CULTURE, BLOOD (ROUTINE X 2)  C DIFFICILE QUICK SCREEN W PCR REFLEX    GASTROINTESTINAL PANEL BY PCR, STOOL (REPLACES STOOL CULTURE)  LIPASE, BLOOD  PROCALCITONIN  HEPATITIS B SURFACE ANTIGEN  HEPATITIS C ANTIBODY  VITAMIN B12  FOLATE  HIV ANTIBODY (ROUTINE TESTING W REFLEX)  PROTIME-INR  PROTIME-INR  GLUCOSE, CAPILLARY  GLUCOSE, CAPILLARY  BASIC METABOLIC PANEL  TROPONIN I (HIGH SENSITIVITY)  TROPONIN I (HIGH SENSITIVITY)  TROPONIN I (HIGH SENSITIVITY)    US Abdomen Limited RUQ (LIVER/GB)  Final Result    DG Chest Port 1 View  Final Result    CT ABDOMEN PELVIS WO CONTRAST  Final Result      Medications  lactated ringers infusion (0 mLs Intravenous Stopped 04/03/22 1141)  dextrose 5 % in lactated ringers infusion (0 mLs Intravenous Hold 04/03/22 0612)   dextrose 50 % solution 0-50 mL (has no administration in time range)  prochlorperazine (COMPAZINE) injection 10 mg (10 mg Intravenous Not Given 04/03/22 0647)  aspirin EC tablet 81 mg (81 mg Oral Given 04/03/22 0914)  propranolol (INDERAL) tablet 10 mg (10 mg Oral Given 04/03/22 2122)  busPIRone (BUSPAR) tablet 15 mg (15 mg Oral Given 04/03/22 2120)  DULoxetine (CYMBALTA) DR capsule 30 mg (30 mg Oral Given 04/03/22 2119)  cyanocobalamin (VITAMIN B12) tablet 1,000 mcg (1,000 mcg Oral Given 04/03/22 0915)  pregabalin (LYRICA) capsule 50 mg (50 mg Oral Given 04/03/22 2120)  enoxaparin (LOVENOX) injection 40 mg (40 mg Subcutaneous Given 04/03/22 0915)  insulin regular, human (MYXREDLIN) 100 units/ 100 mL infusion (1 Units/hr Intravenous Restarted 04/04/22 0248)  lactated ringers infusion (0 mLs Intravenous Hold 04/03/22 0850)  dextrose 5 % in lactated ringers infusion ( Intravenous New Bag/Given 04/04/22 0331)  dextrose 50 % solution 0-50 mL (has no administration in time range)  pantoprazole (PROTONIX) injection 40 mg (40 mg Intravenous Given 04/03/22 2116)  Chlorhexidine Gluconate Cloth 2 % PADS 6 each (6 each Topical Given 04/04/22 0611)  lactulose (CHRONULAC) 10 GM/15ML solution 20 g (20 g Oral Given 04/03/22 2120)  mupirocin ointment (BACTROBAN) 2 % 1 Application (1 Application Nasal Given 04/03/22 2356)  sodium chloride 0.9 % bolus 1,000 mL (0 mLs Intravenous Stopped 04/03/22 0502)  ondansetron (ZOFRAN) injection 4 mg (4 mg Intravenous Given 04/03/22 0402)  lactated ringers bolus 1,814 mL (0 mLs Intravenous Stopped 04/03/22 0700)  potassium chloride 10 mEq in 100 mL IVPB (0 mEq Intravenous Stopped 04/03/22 1141)  potassium chloride SA (KLOR-CON M) CR tablet 40 mEq (40 mEq Oral Given 04/03/22 0530)  lactated ringers bolus 1,814 mL (0 mLs Intravenous Stopped 04/03/22 1141)  potassium chloride 10 mEq in 100 mL IVPB (0 mEq Intravenous Stopped 04/03/22 1141)  metoCLOPramide (REGLAN) injection 10 mg (10 mg  Intravenous Given 04/03/22 0947)  potassium chloride SA (KLOR-CON M) CR tablet 20 mEq (20 mEq Oral Given 04/03/22 1808)  potassium chloride 10 mEq in 100 mL IVPB (10 mEq Intravenous New Bag/Given 04/04/22 0315)     Procedures  /  Critical Care .Critical Care  Performed by: Sedonia Small,  Barth Kirks, MD Authorized by: Maudie Flakes, MD   Critical care provider statement:    Critical care time (minutes):  35   Critical care was necessary to treat or prevent imminent or life-threatening deterioration of the following conditions:  Metabolic crisis   Critical care was time spent personally by me on the following activities:  Development of treatment plan with patient or surrogate, discussions with consultants, evaluation of patient's response to treatment, examination of patient, ordering and review of laboratory studies, ordering and review of radiographic studies, ordering and performing treatments and interventions, pulse oximetry, re-evaluation of patient's condition and review of old charts   ED Course and Medical Decision Making  Initial Impression and Ddx Suspect gastroenteritis with dehydration, suspicious for acute kidney injury.  Other considerations include perforated viscus, diverticulitis, appendicitis, ACS.  Past medical/surgical history that increases complexity of ED encounter: Diabetes  Interpretation of Diagnostics I personally reviewed the EKG and my interpretation is as follows: Sinus tachycardia  Labs reveal prominent gap acidosis with hyperglycemia suspicious for DKA.  Patient Reassessment and Ultimate Disposition/Management     Admitted to medicine.  Patient management required discussion with the following services or consulting groups:  Hospitalist Service  Complexity of Problems Addressed Acute illness or injury that poses threat of life of bodily function  Additional Data Reviewed and Analyzed Further history obtained from: Prior labs/imaging results  Additional  Factors Impacting ED Encounter Risk Consideration of hospitalization  Barth Kirks. Sedonia Small, MD [place] [email or web address]  Final Clinical Impressions(s) / ED Diagnoses     ICD-10-CM   1. Nausea and vomiting, unspecified vomiting type  R11.2     2. Diabetic ketoacidosis without coma associated with type 2 diabetes mellitus ([place])  E11.10     3. Dehydration  E86.0     4. Acute kidney injury ([place])  N17.9       ED Discharge Orders     None        Discharge Instructions Discussed with and Provided to Patient:   Discharge Instructions   None      Maudie Flakes, MD 04/04/22 [phone]

## 2022-04-03 NOTE — Progress Notes (Signed)
  Transition of Care [facility]) Screening Note   Patient Details  Name: RAYMUNDO ROUT Date of Birth: 1956/02/15   Transition of Care [facility]) CM/SW Contact:    Iona Beard, [place] Phone Number: 04/03/2022, 8:25 AM    Transition of Care Department [facility]) has reviewed patient and no TOC needs have been identified at this time. We will continue to monitor patient advancement through interdisciplinary progression rounds. If new patient transition needs arise, please place a TOC consult.

## 2022-04-03 NOTE — ED Notes (Signed)
Assisted patient with using urinal at bedside. No difficulties noted.

## 2022-04-03 NOTE — Consult Note (Signed)
@LOGO @   Referring Provider: Dr. Arbutus Leasat Primary Care Physician:  Benita StabileHall, John Z, MD Primary Gastroenterologist:  Dr. Levon Hedgerastaneda  Date of Admission: 04/03/2022 Date of Consultation: 04/03/2022  Reason for Consultation: Esophageal wall thickening  HPI:  Wesley Ho is a 67 y.o. year old male with history of COPD, diabetes, DVT in 2012, anxiety, alcohol abuse in remission, cirrhosis, who presented to the emergency room with nausea, vomiting, generalized weakness.   ED course: Hemodynamically stable.  O2 saturation 90-95% on room air.  Afebrile. WBC 26.2, hemoglobin 18.6, platelets 277,000. Sodium 132, potassium 2.7, serum creatinine 2.07 (previously 0.8), glucose 411, AST 80, ALT <5, alk phos 120, total bilirubin 2.4, direct bilirubin 0.3, indirect bilirubin 2.1, INR 1.2. Lipase 29. Lactic acid 5.4. UDS positive for benzodiazepines. EKG shows sinus rhythm with nonspecific ST changes.  CT of the abdomen and pelvis showed circumferential thickening of the lower esophagus with mild periesophageal stranding. There is a small lobulated liver with fatty infiltration.  RUQ US: Cirrhosis without liver lesion, s/p cholecystectomy.  Chest x-ray showed hyperinflation.  He was started on IV fluids and insulin drip. GI was consulted to assist with management of his esophageal thickening.   Consult:  Came to the hospital due to nausea and vomiting x 2 days. Also with mid abdominal pain above umbilicus. No hemematemesis or coffee ground emesis. No reflux symptoms. No swallowing problems. No brbpr or melena. Reports he has long history of diarrhea "to some extent". Can go 3-4 days without a BM, then stool consistency can range from firm to diarrhea. Will have 2-3 BMs per day. Last BM was loose 2 days ago. Doesn't take anything for diarrhea or constipation.   Takes Naproxen about 3 days a week for various aches or pains/HA. Will take 2 in a 24 hour period. No aspirin powders. Takes 81 mg aspirin daily.   No  prior EGD.  [facility]. No polyps per patient.   Takes furosemide 1-2 tablets 2-3 times a week for swelling in his feet. Prescribed by podiatrists. Reports doesn't pass ankles. No abdominal distension. Currently feeling a little foggy headed. Reports this happens when he gets sick.   ETOH: Stopped 3 years ago. Used to drink 6 pack a day for many years.  Denies illicit drug use.  Quit smoking about 6-7 years ago.   No Fhx of liver disease. Father with prostate cancer. No Fhx of colon or esophageal cancer.   No unintentional weight loss.   Unable to tell me the year.   Past Medical History:  Diagnosis Date   Anxiety    Arthritis    Bronchitis    Chronic diarrhea    Chronic edema of lower extremity 09/28/2010   COPD (chronic obstructive pulmonary disease) (HCC)    Diabetes mellitus without complication (HCC)    DVT (deep venous thrombosis) (HCC) 09/28/2010   Emphysema of lung (HCC)    Fatigue    H/O Thrombocytopenia 09/28/2010   Left radial fracture    Multiple lung nodules 09/28/2010   Neuropathy     Past Surgical History:  Procedure Laterality Date   CATARACT EXTRACTION W/PHACO Right 01/17/2016   Procedure: CATARACT EXTRACTION PHACO AND INTRAOCULAR LENS PLACEMENT (IOC);  Surgeon: Jethro BolusMark Shapiro, MD;  Location: [facility];  Service: Ophthalmology;  Laterality: Right;  CDE: 4.34   CATARACT EXTRACTION W/PHACO Left 01/31/2016   Procedure: CATARACT EXTRACTION PHACO AND INTRAOCULAR LENS PLACEMENT (IOC);  Surgeon: Jethro BolusMark Shapiro, MD;  Location: [facility];  Service:  Ophthalmology;  Laterality: Left;  CDE: 5.69   CHOLECYSTECTOMY     MOUTH SURGERY      Prior to Admission medications   Medication Sig Start Date End Date Taking? Authorizing Provider  acetaminophen (TYLENOL) 500 MG tablet Take 500-1,000 mg by mouth every 6 (six) hours as needed for mild pain.    [provider]  ALPRAZolam Prudy Feeler) 1 MG tablet Take 1 mg by mouth every 6 (six) hours as needed.  11/17/19   [provider]  aspirin EC 81 MG tablet Take 81 mg by mouth daily.    [provider]  aspirin-sod bicarb-citric acid (ALKA-SELTZER) 325 MG TBEF tablet Take 325 mg by mouth every 6 (six) hours as needed. 01/31/16   [provider]  atorvastatin (LIPITOR) 40 MG tablet SMARTSIG:1 Tablet(s) By Mouth Every Evening 10/05/19   [provider]  busPIRone (BUSPAR) 15 MG tablet Take 1 tablet (15 mg total) by mouth 3 (three) times daily. 06/28/17   Neysa Hotter, MD  Calcium Carb-Cholecalciferol (CALCIUM 1000 + D PO) Take 1 tablet by mouth daily.    [provider]  carboxymethylcellulose (REFRESH TEARS) 0.5 % SOLN 1 drop 2 (two) times daily as needed.     [provider]  cholecalciferol (VITAMIN D) 1000 units tablet Take 1,000 Units by mouth daily.    [provider]  clobetasol cream (TEMOVATE) 0.05 % Apply to the affected area as directed 01/29/17   [provider]  Cyanocobalamin 2500 MCG TABS Take 1 tablet by mouth daily.     [provider]  DULoxetine (CYMBALTA) 30 MG capsule Take 30 mg by mouth 2 (two) times daily. 11/30/19   [provider]  ferrous sulfate (IRON SUPPLEMENT) 325 (65 FE) MG tablet Take 65 mg by mouth daily with breakfast.    [provider]  finasteride (PROSCAR) 5 MG tablet Take 5 mg by mouth daily. 10/05/19   [provider]  furosemide (LASIX) 20 MG tablet Take 1 tablet by mouth daily. 12/07/16   Elfredia Nevins, MD  glipiZIDE (GLUCOTROL) 5 MG tablet Take 5 mg by mouth 2 (two) times daily. 10/06/19   [provider]  hydrochlorothiazide (HYDRODIURIL) 25 MG tablet Take 25 mg by mouth daily.    [provider]  ketoconazole (NIZORAL) 2 % shampoo Apply topically 2 (two) times a week. 11/30/19   [provider]  lidocaine-prilocaine (EMLA) cream Apply to feet for neuropathic pain 04/05/17   Elder Negus, NP  LORazepam (ATIVAN) 2 MG tablet 1-2 mg  three times a day as needed for anxiety 04/16/17   Neysa Hotter, MD  metFORMIN (GLUCOPHAGE) 500 MG tablet Take 500 mg by mouth daily. 10/05/19   [provider]  montelukast (SINGULAIR) 10 MG tablet Take 10 mg by mouth daily. 10/05/19   [provider]  Multiple Vitamins-Minerals (HM COMPLETE 50+ MENS ULTIMATE PO) Take 1 tablet by mouth daily.    [provider]  omega-3 acid ethyl esters (LOVAZA) 1 g capsule Take 2 capsules by mouth daily. 11/30/19   [provider]  Papaverine-Alprostadil 30-10 MG-MCG/ML SOLN by Intracavernosal route.    [provider]  Potassium 99 MG TABS Take 99 mg by mouth daily.    [provider]  pregabalin (LYRICA) 200 MG capsule Take 200 mg by mouth 3 (three) times daily.    [provider]  propranolol (INDERAL) 60 MG tablet SMARTSIG:1 Tablet(s) By Mouth Morning-Evening 10/05/19   [provider]  Specialty Vitamins Products (MM  BIOTIN/KERATIN PO) Take 1 capsule by mouth daily.    [provider]  tamoxifen (NOLVADEX) 20 MG tablet Take 20 mg by mouth daily. 12/19/15   [provider]  tamsulosin (FLOMAX) 0.4 MG CAPS capsule Take 1 capsule by mouth daily. 10/15/16   Elfredia Nevins, MD  testosterone cypionate (DEPOTESTOSTERONE CYPIONATE) 200 MG/ML injection Inject into the skin once a week. 12/10/15   [provider]  Dwyane Luo 100-62.5-25 MCG/INH AEPB  10/26/19   [provider]  WHEY PROTEIN PO Take by mouth daily as needed.     [provider]    Current Facility-Administered Medications  Medication Dose Route Frequency Provider Last Rate Last Admin   aspirin EC tablet 81 mg  81 mg Oral Daily Tat, David, MD   81 mg at 04/03/22 0914   busPIRone (BUSPAR) tablet 15 mg  15 mg Oral TID Catarina Hartshorn, MD   15 mg at 04/03/22 1610   cyanocobalamin (VITAMIN B12) tablet 1,000 mcg  1,000 mcg Oral Daily Tat, David, MD   1,000 mcg at 04/03/22 0915   dextrose 5 % in  lactated ringers infusion   Intravenous Continuous Sabas Sous, MD   Held at 04/03/22 0612   dextrose 5 % in lactated ringers infusion   Intravenous Continuous Tat, David, MD 125 mL/hr at 04/03/22 1041 New Bag at 04/03/22 1041   dextrose 50 % solution 0-50 mL  0-50 mL Intravenous PRN Sabas Sous, MD       dextrose 50 % solution 0-50 mL  0-50 mL Intravenous PRN Tat, Onalee Hua, MD       DULoxetine (CYMBALTA) DR capsule 30 mg  30 mg Oral BID Tat, Onalee Hua, MD   30 mg at 04/03/22 0914   enoxaparin (LOVENOX) injection 40 mg  40 mg Subcutaneous Q24H Tat, Onalee Hua, MD   40 mg at 04/03/22 0915   insulin regular, human (MYXREDLIN) 100 units/ 100 mL infusion   Intravenous Continuous Tat, David, MD 4.2 mL/hr at 04/03/22 1142 4.2 Units/hr at 04/03/22 1142   lactated ringers infusion   Intravenous Continuous Sabas Sous, MD   Stopped at 04/03/22 1141   lactated ringers infusion   Intravenous Continuous Tat, David, MD   Held at 04/03/22 0850   pantoprazole (PROTONIX) injection 40 mg  40 mg Intravenous Pablo Ledger, MD   40 mg at 04/03/22 0947   pregabalin (LYRICA) capsule 50 mg  50 mg Oral BID Tat, Onalee Hua, MD   50 mg at 04/03/22 0915   prochlorperazine (COMPAZINE) injection 10 mg  10 mg Intravenous Q6H PRN Adefeso, Oladapo, DO   10 mg at 04/03/22 9604   propranolol (INDERAL) tablet 10 mg  10 mg Oral BID Tat, Onalee Hua, MD   10 mg at 04/03/22 0915   Current Outpatient Medications  Medication Sig Dispense Refill   acetaminophen (TYLENOL) 500 MG tablet Take 500-1,000 mg by mouth every 6 (six) hours as needed for mild pain.     ALPRAZolam (XANAX) 1 MG tablet Take 1 mg by mouth every 6 (six) hours as needed.     aspirin EC 81 MG tablet Take 81 mg by mouth daily.     aspirin-sod bicarb-citric acid (ALKA-SELTZER) 325 MG TBEF tablet Take 325 mg by mouth every 6 (six) hours as needed.     atorvastatin (LIPITOR) 40 MG tablet SMARTSIG:1 Tablet(s) By Mouth Every Evening     busPIRone (BUSPAR) 15 MG tablet Take 1 tablet  (15 mg total) by mouth 3 (three) times  daily. 90 tablet 0   Calcium Carb-Cholecalciferol (CALCIUM 1000 + D PO) Take 1 tablet by mouth daily.     carboxymethylcellulose (REFRESH TEARS) 0.5 % SOLN 1 drop 2 (two) times daily as needed.      cholecalciferol (VITAMIN D) 1000 units tablet Take 1,000 Units by mouth daily.     clobetasol cream (TEMOVATE) 0.05 % Apply to the affected area as directed     Cyanocobalamin 2500 MCG TABS Take 1 tablet by mouth daily.      DULoxetine (CYMBALTA) 30 MG capsule Take 30 mg by mouth 2 (two) times daily.     ferrous sulfate (IRON SUPPLEMENT) 325 (65 FE) MG tablet Take 65 mg by mouth daily with breakfast.     finasteride (PROSCAR) 5 MG tablet Take 5 mg by mouth daily.     furosemide (LASIX) 20 MG tablet Take 1 tablet by mouth daily.     glipiZIDE (GLUCOTROL) 5 MG tablet Take 5 mg by mouth 2 (two) times daily.     hydrochlorothiazide (HYDRODIURIL) 25 MG tablet Take 25 mg by mouth daily.     ketoconazole (NIZORAL) 2 % shampoo Apply topically 2 (two) times a week.     lidocaine-prilocaine (EMLA) cream Apply to feet for neuropathic pain     LORazepam (ATIVAN) 2 MG tablet 1-2 mg three times a day as needed for anxiety 90 tablet 1   metFORMIN (GLUCOPHAGE) 500 MG tablet Take 500 mg by mouth daily.     montelukast (SINGULAIR) 10 MG tablet Take 10 mg by mouth daily.     Multiple Vitamins-Minerals (HM COMPLETE 50+ MENS ULTIMATE PO) Take 1 tablet by mouth daily.     omega-3 acid ethyl esters (LOVAZA) 1 g capsule Take 2 capsules by mouth daily.     Papaverine-Alprostadil 30-10 MG-MCG/ML SOLN by Intracavernosal route.     Potassium 99 MG TABS Take 99 mg by mouth daily.     pregabalin (LYRICA) 200 MG capsule Take 200 mg by mouth 3 (three) times daily.     propranolol (INDERAL) 60 MG tablet SMARTSIG:1 Tablet(s) By Mouth Morning-Evening     Specialty Vitamins Products (MM BIOTIN/KERATIN PO) Take 1 capsule by mouth daily.     tamoxifen (NOLVADEX) 20 MG tablet Take 20 mg by mouth  daily.     tamsulosin (FLOMAX) 0.4 MG CAPS capsule Take 1 capsule by mouth daily.     testosterone cypionate (DEPOTESTOSTERONE CYPIONATE) 200 MG/ML injection Inject into the skin once a week.     TRELEGY ELLIPTA 100-62.5-25 MCG/INH AEPB      WHEY PROTEIN PO Take by mouth daily as needed.       Allergies as of 04/03/2022   (No Known Allergies)    Family History  Problem Relation Age of Onset   Diabetes Mother    Clotting disorder Father    Alcohol abuse Maternal Uncle    Lung cancer Maternal Grandfather    Emphysema Paternal Grandfather     Social History   Socioeconomic History   Marital status: Divorced    Spouse name: Not on file   Number of children: 2   Years of education: Not on file   Highest education level: Not on file  Occupational History   Occupation: Retired  Tobacco Use   Smoking status: Former    Packs/day: 1.00    Years: 40.00    Total pack years: 40.00    Types: Cigarettes    Quit date: 01/11/2013    Years since quitting: 9.2  Smokeless tobacco: Never  Vaping Use   Vaping Use: Never used  Substance and Sexual Activity   Alcohol use: No    Comment: stopped drinking 2016.   Drug use: No   Sexual activity: Not Currently    Birth control/protection: None  Other Topics Concern   Not on file  Social History Narrative   Not on file   Social Determinants of Health   Financial Resource Strain: Not on file  Food Insecurity: Not on file  Transportation Needs: Not on file  Physical Activity: Not on file  Stress: Not on file  Social Connections: Not on file  Intimate Partner Violence: Not on file    Review of Systems: Gen: Denies fever, chills, loss of appetite, change in weight or weight loss CV: Denies chest pain, heart palpitations, syncope, edema  Resp: Denies shortness of breath with rest, cough, wheezing. GI: See HPI GU : Denies urinary burning. Admits to chronic urgency.  MS: Denies joint pain. Derm: Denies rash. Psych: Denies  depression, anxiety.  Heme: See HPI  Physical Exam: Vital signs in last 24 hours: Temp:  [97.6 F (36.4 C)-97.8 F (36.6 C)] 97.8 F (36.6 C) (01/30 0730) Pulse Rate:  [103-119] 103 (01/30 1130) Resp:  [17-22] 17 (01/30 1130) BP: (127-157)/(75-94) 127/75 (01/30 1130) SpO2:  [94 %-98 %] 95 % (01/30 1130) Weight:  [90.7 kg] 90.7 kg (01/30 0536)   General:   Alert,  well-developed, well-nourished, pleasant and cooperative in NAD.  Head:  Normocephalic and atraumatic. Eyes:  slight sclera clear. Ears:  Normal auditory acuity. Lungs:  Clear throughout to auscultation.   No wheezes, crackles, or rhonchi. No acute distress. Heart:  Regular rate and rhythm; no murmurs, clicks, rubs,  or gallops. Abdomen:  Obese, soft, nontender and nondistended. No masses, hepatosplenomegaly or hernias noted. Normal bowel sounds, without guarding, and without rebound.   Rectal:  Deferred  Msk:  Symmetrical without gross deformities. Normal posture. Extremities:  Without edema. Neurologic:  Alert and oriented to person, location, situation though he did have trouble telling me his birthday and the current year. No asterixis.  Skin:  Intact without significant lesions or rashes. Psych:  Normal mood and affect.  Intake/Output from previous day: 01/29 0701 - 01/30 0700 In: 2415.3 [IV Piggyback:2415.3] Out: -  Intake/Output this shift: Total I/O In: 18.7 [I.V.:18.7] Out: 700 [Urine:700]  Lab Results: Recent Labs    04/03/22 0323  WBC 26.2*  HGB 18.6*  HCT 56.3*  PLT 277   BMET Recent Labs    04/03/22 0323 04/03/22 0528 04/03/22 0839  NA 132* 133* 132*  K 2.7* 3.7 3.8  CL 84* 91* 91*  CO2 10* 14* 11*  GLUCOSE 411* 377* 322*  BUN 30* 32* 32*  CREATININE 2.07* 1.77* 1.66*  CALCIUM 8.9 8.3* 8.4*   LFT Recent Labs    04/03/22 0323 04/03/22 0839  PROT 9.7*  --   ALBUMIN 4.1  --   AST 80*  --   ALT <5  --   ALKPHOS 120  --   BILITOT 2.4* 2.4*  BILIDIR  --  0.3*  IBILI  --  2.1*    PT/INR Recent Labs    04/03/22 0945  LABPROT 15.0  INR 1.2    Studies/Results: US Abdomen Limited RUQ (LIVER/GB)  Result Date: 04/03/2022 CLINICAL DATA:  Elevated liver function studies. EXAM: ULTRASOUND ABDOMEN LIMITED RIGHT UPPER QUADRANT COMPARISON:  CT scan 04/03/2022 FINDINGS: Gallbladder: Surgically absent. Common bile duct: Diameter: 4.6 mm Liver: Cirrhotic changes  involving the liver with a very irregular liver contour. Increased coarse echogenicity with poor through transmission and poor definition of the liver architecture likely a combination of cirrhosis and fatty infiltration. No hepatic lesions are identified. No intrahepatic biliary dilatation. Portal vein is patent on color Doppler imaging with normal direction of blood flow towards the liver. Other: None. IMPRESSION: 1. Status post cholecystectomy. No biliary dilatation. 2. Cirrhotic changes involving the liver but no obvious hepatic lesions. Electronically Signed   By: Rudie Meyer M.D.   On: 04/03/2022 09:30   DG Chest Port 1 View  Result Date: 04/03/2022 CLINICAL DATA:  Chest pain EXAM: PORTABLE CHEST 1 VIEW COMPARISON:  09/02/2019 FINDINGS: Chronic interstitial coarsening from emphysema by 2018 chest CT. There is no edema, consolidation, effusion, or pneumothorax. Normal heart size and stable mediastinal contours. Artifact from EKG leads. IMPRESSION: No evidence of acute disease. Emphysema Electronically Signed   By: Tiburcio Pea M.D.   On: 04/03/2022 04:25   CT ABDOMEN PELVIS WO CONTRAST  Result Date: 04/03/2022 CLINICAL DATA:  Acute, nonlocalized abdominal pain EXAM: CT ABDOMEN AND PELVIS WITHOUT CONTRAST TECHNIQUE: Multidetector CT imaging of the abdomen and pelvis was performed following the standard protocol without IV contrast. RADIATION DOSE REDUCTION: This exam was performed according to the departmental dose-optimization program which includes automated exposure control, adjustment of the mA and/or kV  according to patient size and/or use of iterative reconstruction technique. COMPARISON:  11/20/2016 FINDINGS: Lower chest: Motion artifact. Coronary atherosclerosis. Circumferential thickened appearance of the lower esophagus with possible mild periesophageal fat stranding. Hepatobiliary: Small and lobulated liver with fatty infiltration.Cholecystectomy without bile duct dilatation Pancreas: Unremarkable. Spleen: Unremarkable. Adrenals/Urinary Tract: Negative adrenals. No hydronephrosis or stone. Unremarkable bladder. Stomach/Bowel:  No obstruction. No appendicitis. Vascular/Lymphatic: No acute vascular abnormality. Extensive atheromatous calcification. No mass or adenopathy. Reproductive:No pathologic findings. Other: No ascites or pneumoperitoneum. Fatty enlargement of the left more than right inguinal canal. Musculoskeletal: No acute abnormalities. Ordinary lumbar spine degeneration IMPRESSION: 1. No acute intra-abdominal finding. 2. Circumferential thickened appearance of the lower esophagus, correlate for esophagitis symptoms. 3. Cirrhosis and extensive atherosclerosis. Electronically Signed   By: Tiburcio Pea M.D.   On: 04/03/2022 04:19    Impression: 67 y.o. year old male with history of COPD, diabetes, DVT in 2012, anxiety, alcohol abuse in remission, cirrhosis, who presented to the emergency room with nausea, vomiting, generalized weakness, found to be in DKA and with AKI. GI consulted due to incidental findings of esophageal wall thickening on CT. Notably, cirrhosis also noted on imaging.   Esophageal wall thickening:  Denies dysphagia/odynophagia, history of GERD, unintentional weight loss, or Fhx of esophageal cancer. He does take Naproxen chronically. Differential includes pill induced esophagitis, esophagitis secondary to silent GERD, esophageal candidiasis, and can't rule out malignancy. He will need EGD for further evaluation, but this can be completed in the outpatient  setting.  Cirrhosis: Starting referral for EGD of the liver noted on CT and ultrasound this admission.  Notably, these findings date back at least to 2018.  He has not followed with GI for his cirrhosis.  Likely alcohol in etiology, now sober for 3 years. MELD 3.0 was 23 on presentation driven by AKI and elevated bilirubin though mostly indirect. He also has some concern for hepatic encephalopathy today. No asterixis but had trouble telling be his birthday and the current year. Will check an ammonia. No appreciable ascites or peripheral edema, chronically taking Lasix 20-40 mg a few times a week. He will need first ever  EGD to screen for esophageal varices and additional work-up to exclude other etiologies of cirrhosis as an outpatient.   Fluctuating bowel habits:  Would likely benefit from fiber supplement on a daily basis to help regulate bowels. No alarm symptoms.   Plan: Ammonia CMP, INR daily. Avoid NSAIDs.  No more than 2g of tylenol per 24 hrs.  Outpatient EGD with Dr. Jenetta Downer in a couple of weeks.  2g sodium diet once diet is advanced per hospitalist.  Continue strict alcohol abstinence.  Will need outpatient follow-up for ongoing cirrhosis management.  Recommend fiber supplement daily to help with bowel regularity.    LOS: 0 days    04/03/2022, 11:49 AM   Aliene Altes, PA-C [facility]

## 2022-04-03 NOTE — Hospital Course (Signed)
67 year old male with a history of diabetes mellitus type 2, COPD, DVT 09/2010, anxiety, poor compliance, alcohol abuse in remission, tobacco abuse in remission presenting with nausea, vomiting, and generalized weakness that began on 03/30/2022.  The patient states that he had some intermittent abdominal pain and loose stools.  The patient has a history of chronic loose stools.  He states that in the last 24 hours prior to admission he has only had 1 loose stool without hematochezia or melena.  There is no hematemesis or coffee-ground emesis.  He denies any fevers, chills, chest pain, hemoptysis, headache, neck pain.  He states that he quit drinking alcohol 3 years ago and quit smoking 10 years ago. Upon asking the patient about his regimen for his diabetes mellitus, the patient states " I think I take metformin.  I may take some other medications, but I do not know what they are." Review of his medical record shows a history of poor compliance. In the ED, the patient was afebrile and hemodynamically stable with oxygen saturation 95-90% on room air.  WBC 26.2, hemoglobin 10.6, platelets 277,000.  Sodium 132, potassium 2.7, bicarbonate 10, serum creatinine 2.07 with anion gap 38.  EKG shows sinus rhythm with nonspecific ST changes.  VBG 7.1 7/40/35/14 CT of the abdomen and pelvis showed circumferential thickening of the lower esophagus with mild periesophageal stranding.  There is a small lobulated liver with fatty infiltration.  He is status postcholecystectomy without ductal dilatation.  Chest x-ray showed hyperinflation.  The patient was started on IV fluids and insulin drip.  GI was consulted to assist with management of his esophageal thickening.

## 2022-04-03 NOTE — Inpatient Diabetes Management (Signed)
Inpatient Diabetes Program Recommendations  AACE/ADA: New Consensus Statement on Inpatient Glycemic Control (2015)  Target Ranges:  Prepandial:   less than 140 mg/dL      Peak postprandial:   less than 180 mg/dL (1-2 hours)      Critically ill patients:  140 - 180 mg/dL   Lab Results  Component Value Date   GLUCAP 344 (H) 04/03/2022   HGBA1C 6.1 (H) 07/08/2014    Latest Reference Range & Units 04/03/22 05:46  Lactic Acid, Venous 0.5 - 1.9 mmol/L 5.4 (HH)  (HH): Data is critically high   Latest Reference Range & Units 04/03/22 05:28  Sodium 135 - 145 mmol/L 133 (L)  Potassium 3.5 - 5.1 mmol/L 3.7  Chloride 98 - 111 mmol/L 91 (L)  CO2 22 - 32 mmol/L 14 (L)  Glucose 70 - 99 mg/dL 377 (H)  BUN 8 - 23 mg/dL 32 (H)  Creatinine 0.61 - 1.24 mg/dL 1.77 (H)  Calcium 8.9 - 10.3 mg/dL 8.3 (L)  Anion gap 5 - 15  28 (H)  GFR, Estimated >60 mL/min 41 (L)  (L): Data is abnormally low (H): Data is abnormally high  Latest Reference Range & Units 04/03/22 08:39  Sodium 135 - 145 mmol/L 132 (L)  Potassium 3.5 - 5.1 mmol/L 3.8  Chloride 98 - 111 mmol/L 91 (L)  CO2 22 - 32 mmol/L 11 (L)  Glucose 70 - 99 mg/dL 322 (H)  BUN 8 - 23 mg/dL 32 (H)  Creatinine 0.61 - 1.24 mg/dL 1.66 (H)  Calcium 8.9 - 10.3 mg/dL 8.4 (L)  Anion gap 5 - 15  30 (H)  Magnesium 1.7 - 2.4 mg/dL 2.7 (H)  Bilirubin, Direct 0.0 - 0.2 mg/dL 0.3 (H)  Indirect Bilirubin 0.3 - 0.9 mg/dL 2.1 (H)  Total Bilirubin 0.3 - 1.2 mg/dL 2.4 (H)  GFR, Estimated >60 mL/min 45 (L)  (L): Data is abnormally low (H): Data is abnormally high  Diabetes history: DM2 Outpatient Diabetes medications: Glucotrol 5 mg bid, Metformin 500 mg qd Current orders for Inpatient glycemic control: IV insulin  Inpatient Diabetes Program Recommendations:   Pending A1c Patient admitted in DKA.  Will follow while inpatient.  Thank you, Nani Gasser. Kylle Lall, RN, MSN, CDE  Diabetes Coordinator Inpatient Glycemic Control Team Team Pager [phone]  (8am-5pm) 04/03/2022 10:35 AM

## 2022-04-03 NOTE — H&P (Signed)
History and Physical    Patient: Wesley Ho ION:[id number] DOB: 1955/04/09 DOA: 04/03/2022 DOS: the patient was seen and examined on 04/03/2022 PCP: Celene Squibb, MD  Patient coming from: Home  Chief Complaint:  Chief Complaint  Patient presents with   Emesis   HPI: Wesley Ho is a 67 year old male with a history of diabetes mellitus type 2, COPD, DVT 09/2010, anxiety, poor compliance, alcohol abuse in remission, tobacco abuse in remission presenting with nausea, vomiting, and generalized weakness that began on 03/30/2022.  The patient states that he had some intermittent abdominal pain and loose stools.  The patient has a history of chronic loose stools.  He states that in the last 24 hours prior to admission he has only had 1 loose stool without hematochezia or melena.  There is no hematemesis or coffee-ground emesis.  He denies any fevers, chills, chest pain, hemoptysis, headache, neck pain.  He states that he quit drinking alcohol 3 years ago and quit smoking 10 years ago. Upon asking the patient about his regimen for his diabetes mellitus, the patient states " I think I take metformin.  I may take some other medications, but I do not know what they are." Review of his medical record shows a history of poor compliance. In the ED, the patient was afebrile and hemodynamically stable with oxygen saturation 95-90% on room air.  WBC 26.2, hemoglobin 10.6, platelets 277,000.  Sodium 132, potassium 2.7, bicarbonate 10, serum creatinine 2.07 with anion gap 38.  EKG shows sinus rhythm with nonspecific ST changes.  VBG 7.1 7/40/35/14 CT of the abdomen and pelvis showed circumferential thickening of the lower esophagus with mild periesophageal stranding.  There is a small lobulated liver with fatty infiltration.  He is status postcholecystectomy without ductal dilatation.  Chest x-ray showed hyperinflation.  The patient was started on IV fluids and insulin drip.  GI was consulted to assist with  management of his esophageal thickening.  Review of Systems: As mentioned in the history of present illness. All other systems reviewed and are negative. Past Medical History:  Diagnosis Date   Anxiety    Arthritis    Bronchitis    Chronic diarrhea    Chronic edema of lower extremity 09/28/2010   COPD (chronic obstructive pulmonary disease) (HCC)    Diabetes mellitus without complication (HCC)    DVT (deep venous thrombosis) ([place]) 09/28/2010   Emphysema of lung (HCC)    Fatigue    H/O Thrombocytopenia 09/28/2010   Left radial fracture    Multiple lung nodules 09/28/2010   Neuropathy    Past Surgical History:  Procedure Laterality Date   CATARACT EXTRACTION W/PHACO Right 01/17/2016   Procedure: CATARACT EXTRACTION PHACO AND INTRAOCULAR LENS PLACEMENT ([place]);  Surgeon: Rutherford Guys, MD;  Location: [facility];  Service: Ophthalmology;  Laterality: Right;  CDE: 4.34   CATARACT EXTRACTION W/PHACO Left 01/31/2016   Procedure: CATARACT EXTRACTION PHACO AND INTRAOCULAR LENS PLACEMENT ([facility]);  Surgeon: Rutherford Guys, MD;  Location: [facility];  Service: Ophthalmology;  Laterality: Left;  CDE: 5.69   CHOLECYSTECTOMY     MOUTH SURGERY     Social History:  reports that he quit smoking about 9 years ago. He has a 40.00 pack-year smoking history. He has never used smokeless tobacco. He reports that he does not drink alcohol and does not use drugs.  No Known Allergies  Family History  Problem Relation Age of Onset   Diabetes Mother    Clotting disorder Father  Alcohol abuse Maternal Uncle    Lung cancer Maternal Grandfather    Emphysema Paternal Grandfather     Prior to Admission medications   Medication Sig Start Date End Date Taking? Authorizing Provider  acetaminophen (TYLENOL) 500 MG tablet Take 500-1,000 mg by mouth every 6 (six) hours as needed for mild pain.    [provider]  ALPRAZolam Duanne Moron) 1 MG tablet Take 1 mg by mouth every 6 (six) hours as needed. 11/17/19   [provider]  aspirin EC 81 MG tablet Take 81 mg by mouth daily.    [provider]  aspirin-sod bicarb-citric acid (ALKA-SELTZER) 325 MG TBEF tablet Take 325 mg by mouth every 6 (six) hours as needed. 01/31/16   [provider]  atorvastatin (LIPITOR) 40 MG tablet SMARTSIG:1 Tablet(s) By Mouth Every Evening 10/05/19   [provider]  busPIRone (BUSPAR) 15 MG tablet Take 1 tablet (15 mg total) by mouth 3 (three) times daily. 06/28/17   Norman Clay, MD  Calcium Carb-Cholecalciferol (CALCIUM 1000 + D PO) Take 1 tablet by mouth daily.    [provider]  carboxymethylcellulose (REFRESH TEARS) 0.5 % SOLN 1 drop 2 (two) times daily as needed.     [provider]  cholecalciferol (VITAMIN D) 1000 units tablet Take 1,000 Units by mouth daily.    [provider]  clobetasol cream (TEMOVATE) 0.05 % Apply to the affected area as directed 01/29/17   [provider]  Cyanocobalamin 2500 MCG TABS Take 1 tablet by mouth daily.     [provider]  DULoxetine (CYMBALTA) 30 MG capsule Take 30 mg by mouth 2 (two) times daily. 11/30/19   [provider]  ferrous sulfate (IRON SUPPLEMENT) 325 (65 FE) MG tablet Take 65 mg by mouth daily with breakfast.    [provider]  finasteride (PROSCAR) 5 MG tablet Take 5 mg by mouth daily. 10/05/19   [provider]  furosemide (LASIX) 20 MG tablet Take 1 tablet by mouth daily. 12/07/16   Redmond School, MD  glipiZIDE (GLUCOTROL) 5 MG tablet Take 5 mg by mouth 2 (two) times daily. 10/06/19   [provider]  hydrochlorothiazide (HYDRODIURIL) 25 MG tablet Take 25 mg by mouth daily.    [provider]  ketoconazole (NIZORAL) 2 % shampoo Apply topically 2 (two) times a week. 11/30/19   [provider]  lidocaine-prilocaine (EMLA) cream Apply to feet for neuropathic pain 04/05/17   Garnet Sierras, NP  LORazepam (ATIVAN) 2 MG tablet 1-2 mg three times a day as  needed for anxiety 04/16/17   Norman Clay, MD  metFORMIN (GLUCOPHAGE) 500 MG tablet Take 500 mg by mouth daily. 10/05/19   [provider]  montelukast (SINGULAIR) 10 MG tablet Take 10 mg by mouth daily. 10/05/19   [provider]  Multiple Vitamins-Minerals (HM COMPLETE 50+ MENS ULTIMATE PO) Take 1 tablet by mouth daily.    [provider]  omega-3 acid ethyl esters (LOVAZA) 1 g capsule Take 2 capsules by mouth daily. 11/30/19   [provider]  Papaverine-Alprostadil 30-10 MG-MCG/ML SOLN by Intracavernosal route.    [provider]  Potassium 99 MG TABS Take 99 mg by mouth daily.    [provider]  pregabalin (LYRICA) 200 MG capsule Take 200 mg by mouth 3 (three) times daily.    [provider]  propranolol (INDERAL) 60 MG tablet SMARTSIG:1 Tablet(s) By Mouth Morning-Evening 10/05/19   [provider]  Specialty Vitamins Products (MM  BIOTIN/KERATIN PO) Take 1 capsule by mouth daily.    [provider]  tamoxifen (NOLVADEX) 20 MG tablet Take 20 mg by mouth daily. 12/19/15   [provider]  tamsulosin (FLOMAX) 0.4 MG CAPS capsule Take 1 capsule by mouth daily. 10/15/16   Redmond School, MD  testosterone cypionate (DEPOTESTOSTERONE CYPIONATE) 200 MG/ML injection Inject into the skin once a week. 12/10/15   [provider]  Donnal Debar 100-62.5-25 MCG/INH AEPB  10/26/19   [provider]  WHEY PROTEIN PO Take by mouth daily as needed.     [provider]    Physical Exam: Vitals:   04/03/22 0318 04/03/22 0536 04/03/22 0600 04/03/22 0730  BP: 131/86  (!) 157/91 (!) 147/94  Pulse: (!) 114  (!) 115 (!) 116  Resp: (!) 22  18 18   Temp: 97.6 F (36.4 C)   97.8 F (36.6 C)  TempSrc: Axillary   Oral  SpO2: 98%  95% 95%  Weight:  90.7 kg    Height:  5\' 8"  (1.727 m)     GENERAL:  A&O x 3, NAD, well developed, cooperative, follows commands HEENT: Paint/AT, No thrush, No icterus, No oral  ulcers Neck:  No neck mass, No meningismus, soft, supple CV: RRR, no S3, no S4, no rub, no JVD Lungs: Diminished breath sounds bilateral.  Bibasilar rales.  No wheezing. Abd: soft/NT +BS, nondistended Ext: No edema, no lymphangitis, no cyanosis, no rashes Neuro:  CN II-XII intact, strength 4/5 in RUE, RLE, strength 4/5 LUE, LLE; sensation intact bilateral; no dysmetria; babinski equivocal  Data Reviewed: Data reviewed above and in history  Assessment and Plan: DKA type 2 -patient started on IV insulin with q 1 hour CBG check and q 4 hour BMPs -pt started on aggressive fluid resuscitation -Electrolytes were monitored and repleted -transitioned to [place] insulin once anion gap closed -diet was advanced once anion gap closed -HbA1C--  Esophageal wall thickening -Noted on CT abdomen and pelvis -Start pantoprazole -GI consult  Acute kidney injury -Secondary to volume depletion -Baseline creatinine 0.8-1.1 -Continue IV fluids  Hyponatremia -Secondary to hyperglycemia and volume depletion -Continue IV fluid it is  Lactic acidosis -Multifactorial including acute kidney injury, volume depletion -Blood cultures x 2 sets -Continue fluids -Check procalcitonin -UA and urine culture  Transaminasemia/hyperbilirubinemia -Viral hepatitis serologies -Right upper quadrant ultrasound -fractionate bili  Hypokalemia -Replete -Check magnesium  Obesity -BMI 30.41 -Lifestyle modification  COPD -Stable on room air  Mixed Hyperlipidemia -hold statin due to elevated LFTs  Anxiety -PDMP reviewed -Patient was lives Xanax 1 mg, #90, last refill 03/20/2022 -Patient receives pregabalin 50 mg, #60, last refill 03/22/2022  Diarrhe -check stool pathogen panel -check cdiff    Advance Care Planning: FULL  Consults: none  Family Communication: none  Severity of Illness: The appropriate patient status for this patient is OBSERVATION. Observation status is judged to be reasonable and  necessary in order to provide the required intensity of service to ensure the patient's safety. The patient's presenting symptoms, physical exam findings, and initial radiographic and laboratory data in the context of their medical condition is felt to place them at decreased risk for further clinical deterioration. Furthermore, it is anticipated that the patient will be medically stable for discharge from the hospital within 2 midnights of admission.   Author: Orson Eva, MD 04/03/2022 7:58 AM  For on call review [email or web address].

## 2022-04-04 DIAGNOSIS — E1110 Type 2 diabetes mellitus with ketoacidosis without coma: Principal | ICD-10-CM

## 2022-04-04 DIAGNOSIS — J449 Chronic obstructive pulmonary disease, unspecified: Secondary | ICD-10-CM | POA: Diagnosis not present

## 2022-04-04 DIAGNOSIS — R933 Abnormal findings on diagnostic imaging of other parts of digestive tract: Secondary | ICD-10-CM | POA: Diagnosis not present

## 2022-04-04 DIAGNOSIS — K7030 Alcoholic cirrhosis of liver without ascites: Secondary | ICD-10-CM | POA: Diagnosis not present

## 2022-04-04 LAB — GASTROINTESTINAL PANEL BY PCR, STOOL (REPLACES STOOL CULTURE)

## 2022-04-04 LAB — BASIC METABOLIC PANEL
Anion gap: 11 (ref 5–15)
Anion gap: 11 (ref 5–15)
BUN: 13 mg/dL (ref 8–23)
BUN: 19 mg/dL (ref 8–23)
CO2: 20 mmol/L — ABNORMAL LOW (ref 22–32)
CO2: 20 mmol/L — ABNORMAL LOW (ref 22–32)
Calcium: 7.6 mg/dL — ABNORMAL LOW (ref 8.9–10.3)
Calcium: 7.9 mg/dL — ABNORMAL LOW (ref 8.9–10.3)
Chloride: 101 mmol/L (ref 98–111)
Chloride: 102 mmol/L (ref 98–111)
Creatinine, Ser: 0.91 mg/dL (ref 0.61–1.24)
Creatinine, Ser: 1.01 mg/dL (ref 0.61–1.24)
GFR, Estimated: >60 mL/min (ref >60)
GFR, Estimated: >60 mL/min (ref >60)
Glucose, Bld: 139 mg/dL — ABNORMAL HIGH (ref 70–99)
Glucose, Bld: 661 mg/dL (ref 70–99)
Potassium: 3.1 mmol/L — ABNORMAL LOW (ref 3.5–5.1)
Potassium: 3.4 mmol/L — ABNORMAL LOW (ref 3.5–5.1)
Sodium: 132 mmol/L — ABNORMAL LOW (ref 135–145)
Sodium: 133 mmol/L — ABNORMAL LOW (ref 135–145)

## 2022-04-04 LAB — MAGNESIUM: Magnesium: 1.9 mg/dL (ref 1.7–2.4)

## 2022-04-04 LAB — BETA-HYDROXYBUTYRIC ACID
Beta-Hydroxybutyric Acid: 1.59 mmol/L — ABNORMAL HIGH (ref 0.05–0.27)
Beta-Hydroxybutyric Acid: 2.79 mmol/L — ABNORMAL HIGH (ref 0.05–0.27)

## 2022-04-04 LAB — CBC
HCT: 40.9 % (ref 39.0–52.0)
Hemoglobin: 13.7 g/dL (ref 13.0–17.0)
MCH: 33.6 pg (ref 26.0–34.0)
MCHC: 33.5 g/dL (ref 30.0–36.0)
MCV: 100.2 fL — ABNORMAL HIGH (ref 80.0–100.0)
Platelets: 171 10*3/uL (ref 150–400)
RBC: 4.08 MIL/uL — ABNORMAL LOW (ref 4.22–5.81)
RDW: 13.6 % (ref 11.5–15.5)
WBC: 20.1 10*3/uL — ABNORMAL HIGH (ref 4.0–10.5)
nRBC: 0 % (ref 0.0–0.2)

## 2022-04-04 LAB — COMPREHENSIVE METABOLIC PANEL
ALT: 24 U/L (ref 0–44)
ALT: 25 U/L (ref 0–44)
AST: 27 U/L (ref 15–41)
AST: 35 U/L (ref 15–41)
Albumin: 2.8 g/dL — ABNORMAL LOW (ref 3.5–5.0)
Albumin: 2.9 g/dL — ABNORMAL LOW (ref 3.5–5.0)
Alkaline Phosphatase: 61 U/L (ref 38–126)
Alkaline Phosphatase: 61 U/L (ref 38–126)
Anion gap: 9 (ref 5–15)
Anion gap: 10 (ref 5–15)
BUN: 17 mg/dL (ref 8–23)
BUN: 12 mg/dL (ref 8–23)
CO2: 21 mmol/L — ABNORMAL LOW (ref 22–32)
CO2: 22 mmol/L (ref 22–32)
Calcium: 8.1 mg/dL — ABNORMAL LOW (ref 8.9–10.3)
Calcium: 8.2 mg/dL — ABNORMAL LOW (ref 8.9–10.3)
Chloride: 103 mmol/L (ref 98–111)
Chloride: 103 mmol/L (ref 98–111)
Creatinine, Ser: 0.88 mg/dL (ref 0.61–1.24)
Creatinine, Ser: 0.81 mg/dL (ref 0.61–1.24)
GFR, Estimated: >60 mL/min (ref >60)
GFR, Estimated: >60 mL/min (ref >60)
Glucose, Bld: 134 mg/dL — ABNORMAL HIGH (ref 70–99)
Glucose, Bld: 163 mg/dL — ABNORMAL HIGH (ref 70–99)
Potassium: 3.4 mmol/L — ABNORMAL LOW (ref 3.5–5.1)
Potassium: 3.1 mmol/L — ABNORMAL LOW (ref 3.5–5.1)
Sodium: 133 mmol/L — ABNORMAL LOW (ref 135–145)
Sodium: 135 mmol/L (ref 135–145)
Total Bilirubin: 1.2 mg/dL (ref 0.3–1.2)
Total Bilirubin: 1.2 mg/dL (ref 0.3–1.2)
Total Protein: 6.4 g/dL — ABNORMAL LOW (ref 6.5–8.1)
Total Protein: 6.6 g/dL (ref 6.5–8.1)

## 2022-04-04 LAB — PROTIME-INR
INR: 1.2 (ref 0.8–1.2)
Prothrombin Time: 14.9 seconds (ref 11.4–15.2)

## 2022-04-04 LAB — GLUCOSE, CAPILLARY
Glucose-Capillary: 86 mg/dL (ref 70–99)
Glucose-Capillary: 78 mg/dL (ref 70–99)
Glucose-Capillary: 124 mg/dL — ABNORMAL HIGH (ref 70–99)
Glucose-Capillary: 150 mg/dL — ABNORMAL HIGH (ref 70–99)
Glucose-Capillary: 150 mg/dL — ABNORMAL HIGH (ref 70–99)
Glucose-Capillary: 143 mg/dL — ABNORMAL HIGH (ref 70–99)
Glucose-Capillary: 150 mg/dL — ABNORMAL HIGH (ref 70–99)
Glucose-Capillary: 178 mg/dL — ABNORMAL HIGH (ref 70–99)
Glucose-Capillary: 157 mg/dL — ABNORMAL HIGH (ref 70–99)
Glucose-Capillary: 168 mg/dL — ABNORMAL HIGH (ref 70–99)
Glucose-Capillary: 157 mg/dL — ABNORMAL HIGH (ref 70–99)
Glucose-Capillary: 166 mg/dL — ABNORMAL HIGH (ref 70–99)
Glucose-Capillary: 189 mg/dL — ABNORMAL HIGH (ref 70–99)
Glucose-Capillary: 178 mg/dL — ABNORMAL HIGH (ref 70–99)

## 2022-04-04 LAB — C DIFFICILE QUICK SCREEN W PCR REFLEX
C Diff antigen: NEGATIVE
C Diff interpretation: NOT DETECTED
C Diff toxin: NEGATIVE

## 2022-04-04 LAB — GLUCOSE, RANDOM: Glucose, Bld: 169 mg/dL — ABNORMAL HIGH (ref 70–99)

## 2022-04-04 MED ORDER — POTASSIUM CHLORIDE 10 MEQ/100ML IV SOLN
10.0000 meq | INTRAVENOUS | Status: AC
  Administered 2022-04-04 (×3): 10 meq via INTRAVENOUS
  Filled 2022-04-04 (×3): qty 100

## 2022-04-04 MED ORDER — LIVING WELL WITH DIABETES BOOK: Freq: Once | Status: AC

## 2022-04-04 MED ORDER — POTASSIUM CHLORIDE 10 MEQ/100ML IV SOLN
10.0000 meq | INTRAVENOUS | Status: AC
  Administered 2022-04-04 (×4): 10 meq via INTRAVENOUS
  Filled 2022-04-04 (×3): qty 100

## 2022-04-04 MED ORDER — INSULIN STARTER KIT- PEN NEEDLES (ENGLISH)
1.0000 | Freq: Once | Status: AC
  Administered 2022-04-05: 1
  Filled 2022-04-04: qty 1

## 2022-04-04 MED ORDER — INSULIN GLARGINE-YFGN 100 UNIT/ML ~~LOC~~ SOLN
30.0000 [IU] | SUBCUTANEOUS | Status: DC
  Administered 2022-04-04 – 2022-04-07 (×4): 30 [IU] via SUBCUTANEOUS
  Filled 2022-04-04 (×5): qty 0.3

## 2022-04-04 MED ORDER — INSULIN ASPART 100 UNIT/ML IJ SOLN
0.0000 [IU] | Freq: Three times a day (TID) | INTRAMUSCULAR | Status: DC
  Administered 2022-04-05: 2 [IU] via SUBCUTANEOUS
  Administered 2022-04-05: 1 [IU] via SUBCUTANEOUS
  Administered 2022-04-05: 3 [IU] via SUBCUTANEOUS
  Administered 2022-04-06 (×3): 2 [IU] via SUBCUTANEOUS
  Administered 2022-04-07: 3 [IU] via SUBCUTANEOUS
  Administered 2022-04-07: 2 [IU] via SUBCUTANEOUS

## 2022-04-04 NOTE — Inpatient Diabetes Management (Signed)
Inpatient Diabetes Program Recommendations  AACE/ADA: New Consensus Statement on Inpatient Glycemic Control (2015)  Target Ranges:  Prepandial:   less than 140 mg/dL      Peak postprandial:   less than 180 mg/dL (1-2 hours)      Critically ill patients:  140 - 180 mg/dL   Lab Results  Component Value Date   GLUCAP 143 (H) 04/04/2022   HGBA1C 13.1 (H) 04/03/2022    Latest Reference Range & Units 04/04/22 04:15  Sodium 135 - 145 mmol/L 133 (L)  Potassium 3.5 - 5.1 mmol/L 3.4 (L)  Chloride 98 - 111 mmol/L 103  CO2 22 - 32 mmol/L 21 (L)  Glucose 70 - 99 mg/dL 134 (H)  BUN 8 - 23 mg/dL 17  Creatinine 0.61 - 1.24 mg/dL 0.88  Calcium 8.9 - 10.3 mg/dL 8.1 (L)  Anion gap 5 - 15  9  Alkaline Phosphatase 38 - 126 U/L 61  Albumin 3.5 - 5.0 g/dL 2.8 (L)  AST 15 - 41 U/L 27  ALT 0 - 44 U/L 24  Total Protein 6.5 - 8.1 g/dL 6.4 (L)  Total Bilirubin 0.3 - 1.2 mg/dL 1.2  GFR, Estimated >60 mL/min >60  (L): Data is abnormally low (H): Data is abnormally high  Diabetes history: DM2 Outpatient Diabetes medications: Glucotrol 5 mg bid, Metformin 500 mg qd Current orders for Inpatient glycemic control: IV insulin  Inpatient Diabetes Program Recommendations:   Please consider when transitioning to [place] insulin: -Semglee 30 units qd (give 2 hrs. Prior to D/C of IV insulin drip) -Novolog correction scale 0-9 units q 4 hrs. While NPO.  Ordered Living Well With Diabetes and insulin pen starter kit to prepare for discharge if D/C'd on insulin. A1c 13.1.  Thank you, Nani Gasser. Jahliyah Trice, RN, MSN, CDE  Diabetes Coordinator Inpatient Glycemic Control Team Team Pager [phone] (8am-5pm) 04/04/2022 7:55 AM

## 2022-04-04 NOTE — Progress Notes (Signed)
PROGRESS NOTE  Wesley Ho VHQ:[id number] DOB: 05-02-1955 DOA: 04/03/2022 PCP: Celene Squibb, MD  HPI/Recap of past 24 hours: Wesley Ho is a 67 year old male with a history of DM type 2, COPD, DVT 09/2010, anxiety, poor compliance, alcohol abuse in remission, tobacco abuse in remission presenting with nausea, vomiting, and generalized weakness that began on 03/30/2022.  The patient states that he had some intermittent abdominal pain and loose stools.  The patient has a history of chronic loose stools. He states that he quit drinking alcohol 3 years ago and quit smoking 10 years ago. Review of his medical record shows a history of poor compliance. In the ED, the patient was afebrile and hemodynamically stable with oxygen saturation 95-90% on room air.  WBC 26.2, hemoglobin 10.6, platelets 277,000.  Sodium 132, potassium 2.7, bicarbonate 10, serum creatinine 2.07 with anion gap 38.  EKG shows sinus rhythm with nonspecific ST changes.  VBG 7.1 7/40/35/14. CT of the abdomen and pelvis showed circumferential thickening of the lower esophagus with mild periesophageal stranding.  There is a small lobulated liver with fatty infiltration.  He is status postcholecystectomy without ductal dilatation.  Chest x-ray showed hyperinflation.  The patient was started on IV fluids and insulin drip.  GI was consulted to assist with management of his esophageal thickening.  Patient admitted for further management.    Today, patient reported feeling better overall, reports feeling weak overall, with some intermittent abdominal pain.  Discussed the possibility of insulin as an outpatient, but patient is resistant stating he is not able to give himself injections.    Assessment/Plan: Principal Problem:   DKA (diabetic ketoacidosis) (HCC) Active Problems:   Hyponatremia   ETOH abuse   COPD (chronic obstructive pulmonary disease) (HCC)   AKI (acute kidney injury) (HCC)   Lactic acidosis   Class 1 obesity   DKA,  type 2 (HCC)   Abnormal CT scan, esophagus   Alcoholic cirrhosis of liver without ascites (HCC)   DKA Diabetes mellitus type 2, uncontrolled A1c 13.1 Patient is status post insulin drip, will be switching to subcu insulin as anion gap has closed, bicarb has normalized and CBGs are controlled Patient with history of noncompliance, will need insulin subcu on discharge Switch to SSI, Semglee, Accu-Cheks, hypoglycemic protocol Advance diet Diabetes coordinator on board, appreciate recs  Acute kidney injury Resolved, likely 2/2 above IV fluids  Hypokalemia Replace as needed  Leukocytosis Possibly reactive, afebrile UA unremarkable, chest x-ray with no acute findings, respiratory panel negative, C. difficile negative BC x 2 pending Procalcitonin 0.19 Daily CBC  Lactic acidosis Multifactorial including acute kidney injury, volume depletion, unlikely infection Workup as above Continue fluids  History of liver cirrhosis Possibly due to alcohol versus NASH Noted transaminasemia/hyperbilirubinemia Improved Viral hepatitis serologies negative CT noted cirrhosis of liver GI on board, follow-up as an outpatient for further workup  Esophageal wall thickening Noted on CT abdomen and pelvis Possibly reactive due to vomiting Continue PPI GI consulted, outpatient follow-up with GI for further workup including EGD  COPD Stable on room air  Mixed Hyperlipidemia Resume statins once able   Anxiety Patient on PTA Xanax 1 mg, pregabalin 50 mg  Obesity Lifestyle modification advised      Estimated body mass index is 32.88 kg/m as calculated from the following:   Height as of this encounter: 5\' 8"  (1.727 m).   Weight as of this encounter: 98.1 kg.     Code Status: Full  Family Communication: None at bedside  Disposition Plan: Status is: Inpatient Remains inpatient appropriate because: Level of  care      Consultants: GI  Procedures: None  Antimicrobials: None  DVT prophylaxis: Lovenox   Objective: Vitals:   04/04/22 1200 04/04/22 1300 04/04/22 1400 04/04/22 1500  BP: 136/72 131/67 123/72 117/63  Pulse: 73 71 73 73  Resp: 18 17 18 15   Temp:      TempSrc:      SpO2: 95% 94% 94% 94%  Weight:      Height:        Intake/Output Summary (Last 24 hours) at 04/04/2022 1553 Last data filed at 04/04/2022 1301 Gross per 24 hour  Intake 2131.03 ml  Output 2900 ml  Net -768.97 ml   Filed Weights   04/03/22 0536 04/03/22 1600  Weight: 90.7 kg 98.1 kg    Exam: General: NAD, appears deconditioned, weak Cardiovascular: S1, S2 present Respiratory: CTAB Abdomen: Soft, nontender, nondistended, bowel sounds present Musculoskeletal: No bilateral pedal edema noted Skin: Normal Psychiatry: Normal mood    Data Reviewed: CBC: Recent Labs  Lab 04/03/22 0323 04/04/22 0415  WBC 26.2* 20.1*  HGB 18.6* 13.7  HCT 56.3* 40.9  MCV 101.8* 100.2*  PLT 277 171   Basic Metabolic Panel: Recent Labs  Lab 04/03/22 0839 04/03/22 1155 04/03/22 1957 04/04/22 0004 04/04/22 0415 04/04/22 0802 04/04/22 0804 04/04/22 0938 04/04/22 1214  NA 132*   < > 130* 132* 133*  --  133*  --  135  K 3.8   < > 3.4* 3.1* 3.4*  --  3.4*  --  3.1*  CL 91*   < > 100 101 103  --  102  --  103  CO2 11*   < > 19* 20* 21*  --  20*  --  22  GLUCOSE 322*   < > 149* 139* 134*  --  661* 169* 163*  BUN 32*   < > 23 19 17   --  13  --  12  CREATININE 1.66*   < > 0.97 0.91 0.88  --  1.01  --  0.81  CALCIUM 8.4*   < > 8.0* 7.9* 8.1*  --  7.6*  --  8.2*  MG 2.7*  --   --   --   --  1.9  --   --   --    < > = values in this interval not displayed.   GFR: Estimated Creatinine Clearance: 99.1 mL/min (by C-G formula based on SCr of 0.81 mg/dL). Liver Function Tests: Recent Labs  Lab 04/03/22 0323 04/03/22 0839 04/04/22 0415 04/04/22 1214  AST 80*  --  27 35  ALT <5  --  24 25  ALKPHOS 120  --   61 61  BILITOT 2.4* 2.4* 1.2 1.2  PROT 9.7*  --  6.4* 6.6  ALBUMIN 4.1  --  2.8* 2.9*   Recent Labs  Lab 04/03/22 0323  LIPASE 29   Recent Labs  Lab 04/03/22 1302  AMMONIA 43*   Coagulation Profile: Recent Labs  Lab 04/03/22 0945 04/04/22 0415  INR 1.2 1.2   Cardiac Enzymes: No results for input(s): "CKTOTAL", "CKMB", "CKMBINDEX", "TROPONINI" in the last 168 hours. BNP (last 3 results) No results for input(s): "PROBNP" in the last 8760 hours. HbA1C: Recent Labs    04/03/22 0839  HGBA1C 13.1*   CBG: Recent Labs  Lab 04/04/22 0807 04/04/22 0859 04/04/22 1021 04/04/22 1207 04/04/22 1537  GLUCAP 150* 178* 157* 168* 166*   Lipid Profile:  No results for input(s): "CHOL", "HDL", "LDLCALC", "TRIG", "CHOLHDL", "LDLDIRECT" in the last 72 hours. Thyroid Function Tests: No results for input(s): "TSH", "T4TOTAL", "FREET4", "T3FREE", "THYROIDAB" in the last 72 hours. Anemia Panel: Recent Labs    04/03/22 0839  VITAMINB12 550  FOLATE 10.7   Urine analysis:    Component Value Date/Time   COLORURINE YELLOW 04/03/2022 [place] 04/03/2022 0958   LABSPEC 1.022 04/03/2022 0958   PHURINE 5.0 04/03/2022 0958   GLUCOSEU >=500 (A) 04/03/2022 0958   HGBUR SMALL (A) 04/03/2022 0958   BILIRUBINUR NEGATIVE 04/03/2022 0958   KETONESUR 80 (A) 04/03/2022 0958   PROTEINUR 30 (A) 04/03/2022 0958   UROBILINOGEN 0.2 07/08/2014 1203   NITRITE NEGATIVE 04/03/2022 0958   LEUKOCYTESUR NEGATIVE 04/03/2022 0958   Sepsis Labs: @LABRCNTIP (procalcitonin:4,lacticidven:4)  ) Recent Results (from the past 240 hour(s))  Resp panel by RT-PCR (RSV, Flu A&B, Covid) Anterior Nasal Swab     Status: None   Collection Time: 04/03/22  3:26 AM   Specimen: Anterior Nasal Swab  Result Value Ref Range Status   SARS Coronavirus 2 by RT PCR NEGATIVE NEGATIVE Final    Comment: (NOTE) SARS-CoV-2 target nucleic acids are NOT DETECTED.  The SARS-CoV-2 RNA is generally detectable in  upper respiratory specimens during the acute phase of infection. The lowest concentration of SARS-CoV-2 viral copies this assay can detect is 138 copies/mL. A negative result does not preclude SARS-Cov-2 infection and should not be used as the sole basis for treatment or other patient management decisions. A negative result may occur with  improper specimen collection/handling, submission of specimen other than nasopharyngeal swab, presence of viral mutation(s) within the areas targeted by this assay, and inadequate number of viral copies(<138 copies/mL). A negative result must be combined with clinical observations, patient history, and epidemiological information. The expected result is Negative.  Fact Sheet for Patients:  EntrepreneurPulse.com.au  Fact Sheet for Healthcare Providers:  IncredibleEmployment.be  This test is no t yet approved or cleared by the [place] FDA and  has been authorized for detection and/or diagnosis of SARS-CoV-2 by FDA under an Emergency Use Authorization (EUA). This EUA will remain  in effect (meaning this test can be used) for the duration of the COVID-19 declaration under Section 564(b)(1) of the Act, 21 U.S.C.section 360bbb-3(b)(1), unless the authorization is terminated  or revoked sooner.       Influenza A by PCR NEGATIVE NEGATIVE Final   Influenza B by PCR NEGATIVE NEGATIVE Final    Comment: (NOTE) The Xpert Xpress SARS-CoV-2/FLU/RSV plus assay is intended as an aid in the diagnosis of influenza from Nasopharyngeal swab specimens and should not be used as a sole basis for treatment. Nasal washings and aspirates are unacceptable for Xpert Xpress SARS-CoV-2/FLU/RSV testing.  Fact Sheet for Patients: EntrepreneurPulse.com.au  Fact Sheet for Healthcare Providers: IncredibleEmployment.be  This test is not yet approved or cleared by the [place] FDA and has been  authorized for detection and/or diagnosis of SARS-CoV-2 by FDA under an Emergency Use Authorization (EUA). This EUA will remain in effect (meaning this test can be used) for the duration of the COVID-19 declaration under Section 564(b)(1) of the Act, 21 U.S.C. section 360bbb-3(b)(1), unless the authorization is terminated or revoked.     Resp Syncytial Virus by PCR NEGATIVE NEGATIVE Final    Comment: (NOTE) Fact Sheet for Patients: EntrepreneurPulse.com.au  Fact Sheet for Healthcare Providers: IncredibleEmployment.be  This test is not yet approved or cleared by the [place] and  has been authorized for detection and/or diagnosis of SARS-CoV-2 by FDA under an Emergency Use Authorization (EUA). This EUA will remain in effect (meaning this test can be used) for the duration of the COVID-19 declaration under Section 564(b)(1) of the Act, 21 U.S.C. section 360bbb-3(b)(1), unless the authorization is terminated or revoked.  Performed at [facility], [address], [place], [place] [address]   Culture, blood (Routine X 2) w Reflex to ID Panel     Status: None (Preliminary result)   Collection Time: 04/03/22  8:39 AM   Specimen: BLOOD LEFT HAND  Result Value Ref Range Status   Specimen Description   Final    BLOOD LEFT HAND Performed at [facility], [address], [place], [place] [address]    Special Requests   Final    BOTTLES DRAWN AEROBIC AND ANAEROBIC Blood Culture adequate volume Performed at [facility], [address], [place], [place] [address]    Culture   Final    NO GROWTH 1 DAY Performed at [facility], [address], [place], [place] [address]    Report Status PENDING  Incomplete  Culture, blood (Routine X 2) w Reflex to ID Panel     Status: None (Preliminary result)   Collection Time: 04/03/22  8:54 AM   Specimen: BLOOD RIGHT HAND  Result Value Ref Range Status   Specimen Description   Final    BLOOD RIGHT  HAND Performed at [facility], [address] [place], [place] [address]    Special Requests   Final    BOTTLES DRAWN AEROBIC AND ANAEROBIC Blood Culture adequate volume Performed at [facility], [address], [place], [place] [address]    Culture   Final    NO GROWTH 1 DAY Performed at [facility], [address], [place], [place] [address]    Report Status PENDING  Incomplete  MRSA Next Gen by PCR, Nasal     Status: Abnormal   Collection Time: 04/03/22  4:15 PM   Specimen: Nasal Mucosa; Nasal Swab  Result Value Ref Range Status   MRSA by PCR Next Gen DETECTED (A) NOT DETECTED Final    Comment: RESULT CALLED TO, READ BACK BY AND VERIFIED WITH: BHATTREI,S ON 04/03/22 AT 2235 BY LOY,C (NOTE) The GeneXpert MRSA Assay (FDA approved for NASAL specimens only), is one component of a comprehensive MRSA colonization surveillance program. It is not intended to diagnose MRSA infection nor to guide or monitor treatment for MRSA infections. Test performance is not FDA approved in patients less than 57 years old. Performed at [facility], [address], [place], [place] [address]   C Difficile Quick Screen w PCR reflex     Status: None   Collection Time: 04/04/22 12:01 AM   Specimen: Stool  Result Value Ref Range Status   C Diff antigen NEGATIVE NEGATIVE Final   C Diff toxin NEGATIVE NEGATIVE Final   C Diff interpretation No C. difficile detected.  Final    Comment: Performed at [facility], [address], [place], [place] [address]      Studies: No results found.  Scheduled Meds:  aspirin EC  81 mg Oral Daily   busPIRone  15 mg Oral TID   Chlorhexidine Gluconate Cloth  6 each Topical Q0600   cyanocobalamin  1,000 mcg Oral Daily   DULoxetine  30 mg Oral BID   enoxaparin (LOVENOX) injection  40 mg Subcutaneous Q24H   insulin aspart  0-9 Units Subcutaneous TID  WC   insulin glargine-yfgn  30 Units Subcutaneous Q24H   insulin starter kit- pen needles  1 kit Other Once    lactulose  20 g Oral BID   living well with diabetes book   Does not apply Once   mupirocin ointment  1 Application Nasal BID   pantoprazole (PROTONIX) IV  40 mg Intravenous Q12H   pregabalin  50 mg Oral BID   propranolol  10 mg Oral BID    Continuous Infusions:  dextrose 5% lactated ringers Stopped (04/03/22 0612)   dextrose 5% lactated ringers 125 mL/hr at 04/04/22 1214   insulin 2 Units/hr (04/04/22 1208)   lactated ringers Stopped (04/03/22 1041)   lactated ringers Stopped (04/03/22 0850)     LOS: 1 day     Alma Friendly, MD [facility]  If 7PM-7AM, please contact night-coverage [email or web address] 04/04/2022, 3:53 PM

## 2022-04-04 NOTE — Progress Notes (Addendum)
Subjective: Patient feeling better this morning. A/ox4. Nausea and vomiting have improved. He is feeling hungry. Has some occasional, intermittent epigastric pain. No overt heartburn. States the medication they are giving him for his epigastric pain helps ( protonix?)   Objective: Vital signs in last 24 hours: Temp:  [97.5 F (36.4 C)-98.3 F (36.8 C)] 97.9 F (36.6 C) (01/31 0724) Pulse Rate:  [75-112] 75 (01/31 0800) Resp:  [11-19] 14 (01/31 0800) BP: (114-158)/(60-87) 114/60 (01/31 0800) SpO2:  [91 %-98 %] 92 % (01/31 0800) Weight:  [98.1 kg] 98.1 kg (01/30 1600) Last BM Date : 04/04/22 General:   Alert and oriented, pleasant Head:  Normocephalic and atraumatic. Eyes:  No icterus, sclera clear. Conjuctiva pink.  Mouth:  Without lesions, mucosa pink and moist.  Heart:  S1, S2 present, no murmurs noted.  Lungs: Clear to auscultation bilaterally, without wheezing, rales, or rhonchi.  Abdomen:  Bowel sounds present, soft, non-tender, non-distended. No HSM or hernias noted. No rebound or guarding. No masses appreciated  Msk:  Symmetrical without gross deformities. Normal posture. Pulses:  Normal pulses noted. Extremities:  Without clubbing or edema. Neurologic:  Alert and  oriented x4;  grossly normal neurologically.  No asterixis  Skin:  Warm and dry, intact without significant lesions.  Psych:  Alert and cooperative. Normal mood and affect.  Intake/Output from previous day: 01/30 0701 - 01/31 0700 In: 2164.6 [I.V.:2128.2; IV Piggyback:36.3] Out: 2650 [Urine:2650] Intake/Output this shift: Total I/O In: -  Out: 300 [Urine:300]  Lab Results: Recent Labs    04/03/22 0323 04/04/22 0415  WBC 26.2* 20.1*  HGB 18.6* 13.7  HCT 56.3* 40.9  PLT 277 171   BMET Recent Labs    04/04/22 0004 04/04/22 0415 04/04/22 0804  NA 132* 133* 133*  K 3.1* 3.4* 3.4*  CL 101 103 102  CO2 20* 21* 20*  GLUCOSE 139* 134* 661*  BUN 19 17 13   CREATININE 0.91 0.88 1.01  CALCIUM 7.9*  8.1* 7.6*   LFT Recent Labs    04/03/22 0323 04/03/22 0839 04/04/22 0415  PROT 9.7*  --  6.4*  ALBUMIN 4.1  --  2.8*  AST 80*  --  27  ALT <5  --  24  ALKPHOS 120  --  61  BILITOT 2.4* 2.4* 1.2  BILIDIR  --  0.3*  --   IBILI  --  2.1*  --    PT/INR Recent Labs    04/03/22 0945 04/04/22 0415  LABPROT 15.0 14.9  INR 1.2 1.2   Hepatitis Panel Recent Labs    04/03/22 0839  HEPBSAG NON REACTIVE  HCVAB NON REACTIVE    Studies/Results: US Abdomen Limited RUQ (LIVER/GB)  Result Date: 04/03/2022 CLINICAL DATA:  Elevated liver function studies. EXAM: ULTRASOUND ABDOMEN LIMITED RIGHT UPPER QUADRANT COMPARISON:  CT scan 04/03/2022 FINDINGS: Gallbladder: Surgically absent. Common bile duct: Diameter: 4.6 mm Liver: Cirrhotic changes involving the liver with a very irregular liver contour. Increased coarse echogenicity with poor through transmission and poor definition of the liver architecture likely a combination of cirrhosis and fatty infiltration. No hepatic lesions are identified. No intrahepatic biliary dilatation. Portal vein is patent on color Doppler imaging with normal direction of blood flow towards the liver. Other: None. IMPRESSION: 1. Status post cholecystectomy. No biliary dilatation. 2. Cirrhotic changes involving the liver but no obvious hepatic lesions. Electronically Signed   By: Marijo Sanes M.D.   On: 04/03/2022 09:30   DG Chest Port 1 View  Result Date: 04/03/2022 CLINICAL DATA:  Chest pain EXAM: PORTABLE CHEST 1 VIEW COMPARISON:  09/02/2019 FINDINGS: Chronic interstitial coarsening from emphysema by 2018 chest CT. There is no edema, consolidation, effusion, or pneumothorax. Normal heart size and stable mediastinal contours. Artifact from EKG leads. IMPRESSION: No evidence of acute disease. Emphysema Electronically Signed   By: Jorje Guild M.D.   On: 04/03/2022 04:25   CT ABDOMEN PELVIS WO CONTRAST  Result Date: 04/03/2022 CLINICAL DATA:  Acute, nonlocalized  abdominal pain EXAM: CT ABDOMEN AND PELVIS WITHOUT CONTRAST TECHNIQUE: Multidetector CT imaging of the abdomen and pelvis was performed following the standard protocol without IV contrast. RADIATION DOSE REDUCTION: This exam was performed according to the departmental dose-optimization program which includes automated exposure control, adjustment of the mA and/or kV according to patient size and/or use of iterative reconstruction technique. COMPARISON:  11/20/2016 FINDINGS: Lower chest: Motion artifact. Coronary atherosclerosis. Circumferential thickened appearance of the lower esophagus with possible mild periesophageal fat stranding. Hepatobiliary: Small and lobulated liver with fatty infiltration.Cholecystectomy without bile duct dilatation Pancreas: Unremarkable. Spleen: Unremarkable. Adrenals/Urinary Tract: Negative adrenals. No hydronephrosis or stone. Unremarkable bladder. Stomach/Bowel:  No obstruction. No appendicitis. Vascular/Lymphatic: No acute vascular abnormality. Extensive atheromatous calcification. No mass or adenopathy. Reproductive:No pathologic findings. Other: No ascites or pneumoperitoneum. Fatty enlargement of the left more than right inguinal canal. Musculoskeletal: No acute abnormalities. Ordinary lumbar spine degeneration IMPRESSION: 1. No acute intra-abdominal finding. 2. Circumferential thickened appearance of the lower esophagus, correlate for esophagitis symptoms. 3. Cirrhosis and extensive atherosclerosis. Electronically Signed   By: Jorje Guild M.D.   On: 04/03/2022 04:19    Assessment: Wesley Ho. Wesley Ho is a 67 year old male with history of COPD, diabetes, DVT in 2012, anxiety, alcohol abuse in remission, cirrhosis, who presented to the emergency room with n/v, generalized weakness, found to be in DKA and with AKI. GI consulted due to incidental findings of esophageal wall thickening on CT. Also with findings of cirrhosis on imaging.   Esophageal wall thickening:  Denies  dysphagia/odynophagia, history of GERD, unintentional weight loss, or Fhx of esophageal cancer. takes Naproxen chronically. Differential includes pill esophagitis, esophagitis secondary to silent GERD, esophageal candidiasis, and can't rule out malignancy. He does endorse some intermittent epigastric pain which is improved with the medication he states they are giving him (protonix?) He will need EGD for further evaluation, can be completed in the outpatient setting once clinically more stable.    Cirrhosis: Cirrhosis of the liver noted on CT and ultrasound this admission.  Notably, these findings date back at least to 2018. no previous following with GI for his cirrhosis.  Likely secondary to ETOH, now sober x3 years. MELD 3.0 was 23 on presentation driven by AKI and elevated bilirubin though mostly indirect, MELD 3.0 13 today. Due to concerns for HE yesterday with some AMS, elevated Ammonia (43), started on lactulose. No overt ascites or peripheral edema, chronically maintained on Lasix 20-40 mg as outpatient a few times a week. A/ox4 today. No BMs yet this am.   As above will need outpatient EGD for CT findings, can evaluate for esophageal varices at that time as well. Will also need additional cirrhosis work-up as outpatient.  Fluctuating bowel habits: Would likely benefit from fiber supplement on a daily basis to help regulate bowels. No alarm symptoms.   Plan: MELD labs daily  Avoid NSAIDs  Limit tylenol to 2g/24 hr Outpatient EGD for variceal screening/esophageal wall thickening 2g sodium diet  Strict ETOH abstinence Outpatient cirrhosis workup 1T fiber BID with meals  Continue  PPI BID    LOS: 1 day    04/04/2022, 9:51 AM   Chelsea L. Alver Sorrow, MSN, APRN, AGNP-C Adult-Gerontology Nurse Practitioner [facility]

## 2022-04-04 NOTE — Progress Notes (Signed)
Got patient up to [facility]. Tolerated well. Patient is very shaky and weak did require help with standing.

## 2022-04-05 ENCOUNTER — Encounter: Payer: Self-pay | Admitting: Gastroenterology

## 2022-04-05 ENCOUNTER — Telehealth: Payer: Self-pay | Admitting: Gastroenterology

## 2022-04-05 DIAGNOSIS — E1110 Type 2 diabetes mellitus with ketoacidosis without coma: Secondary | ICD-10-CM | POA: Diagnosis not present

## 2022-04-05 DIAGNOSIS — K7030 Alcoholic cirrhosis of liver without ascites: Secondary | ICD-10-CM | POA: Diagnosis not present

## 2022-04-05 DIAGNOSIS — R112 Nausea with vomiting, unspecified: Secondary | ICD-10-CM

## 2022-04-05 DIAGNOSIS — R933 Abnormal findings on diagnostic imaging of other parts of digestive tract: Secondary | ICD-10-CM | POA: Diagnosis not present

## 2022-04-05 DIAGNOSIS — J449 Chronic obstructive pulmonary disease, unspecified: Secondary | ICD-10-CM | POA: Diagnosis not present

## 2022-04-05 LAB — BASIC METABOLIC PANEL
Anion gap: 11 (ref 5–15)
BUN: 10 mg/dL (ref 8–23)
CO2: 24 mmol/L (ref 22–32)
Calcium: 8 mg/dL — ABNORMAL LOW (ref 8.9–10.3)
Chloride: 98 mmol/L (ref 98–111)
Creatinine, Ser: 0.84 mg/dL (ref 0.61–1.24)
GFR, Estimated: >60 mL/min (ref >60)
Glucose, Bld: 161 mg/dL — ABNORMAL HIGH (ref 70–99)
Potassium: 2.9 mmol/L — ABNORMAL LOW (ref 3.5–5.1)
Sodium: 133 mmol/L — ABNORMAL LOW (ref 135–145)

## 2022-04-05 LAB — CBC WITH DIFFERENTIAL/PLATELET
Abs Immature Granulocytes: 0.04 10*3/uL (ref 0.00–0.07)
Basophils Absolute: 0 10*3/uL (ref 0.0–0.1)
Basophils Relative: 0 %
Eosinophils Absolute: 0.1 10*3/uL (ref 0.0–0.5)
Eosinophils Relative: 0 %
HCT: 36.8 % — ABNORMAL LOW (ref 39.0–52.0)
Hemoglobin: 12.3 g/dL — ABNORMAL LOW (ref 13.0–17.0)
Immature Granulocytes: 0 %
Lymphocytes Relative: 24 %
Lymphs Abs: 2.8 10*3/uL (ref 0.7–4.0)
MCH: 33 pg (ref 26.0–34.0)
MCHC: 33.4 g/dL (ref 30.0–36.0)
MCV: 98.7 fL (ref 80.0–100.0)
Monocytes Absolute: 1.1 10*3/uL — ABNORMAL HIGH (ref 0.1–1.0)
Monocytes Relative: 9 %
Neutro Abs: 8.1 10*3/uL — ABNORMAL HIGH (ref 1.7–7.7)
Neutrophils Relative %: 67 %
Platelets: 138 10*3/uL — ABNORMAL LOW (ref 150–400)
RBC: 3.73 MIL/uL — ABNORMAL LOW (ref 4.22–5.81)
RDW: 14 % (ref 11.5–15.5)
WBC: 12.1 10*3/uL — ABNORMAL HIGH (ref 4.0–10.5)
nRBC: 0 % (ref 0.0–0.2)

## 2022-04-05 LAB — GLUCOSE, CAPILLARY
Glucose-Capillary: 173 mg/dL — ABNORMAL HIGH (ref 70–99)
Glucose-Capillary: 151 mg/dL — ABNORMAL HIGH (ref 70–99)
Glucose-Capillary: 228 mg/dL — ABNORMAL HIGH (ref 70–99)
Glucose-Capillary: 159 mg/dL — ABNORMAL HIGH (ref 70–99)
Glucose-Capillary: 155 mg/dL — ABNORMAL HIGH (ref 70–99)

## 2022-04-05 LAB — LACTIC ACID, PLASMA: Lactic Acid, Venous: 0.9 mmol/L (ref 0.5–1.9)

## 2022-04-05 LAB — MAGNESIUM: Magnesium: 2.1 mg/dL (ref 1.7–2.4)

## 2022-04-05 MED ORDER — ALPRAZOLAM 1 MG PO TABS
1.0000 mg | ORAL_TABLET | Freq: Three times a day (TID) | ORAL | Status: DC | PRN
  Administered 2022-04-05 – 2022-04-07 (×5): 1 mg via ORAL
  Filled 2022-04-05: qty 1
  Filled 2022-04-05 (×2): qty 2
  Filled 2022-04-05 (×2): qty 1

## 2022-04-05 MED ORDER — POTASSIUM CHLORIDE CRYS ER 20 MEQ PO TBCR
40.0000 meq | EXTENDED_RELEASE_TABLET | Freq: Two times a day (BID) | ORAL | Status: DC
  Administered 2022-04-05: 40 meq via ORAL
  Filled 2022-04-05: qty 2

## 2022-04-05 MED ORDER — ATORVASTATIN CALCIUM 40 MG PO TABS
40.0000 mg | ORAL_TABLET | Freq: Every day | ORAL | Status: DC
  Administered 2022-04-05 – 2022-04-07 (×3): 40 mg via ORAL
  Filled 2022-04-05 (×3): qty 1

## 2022-04-05 MED ORDER — FUROSEMIDE 40 MG PO TABS
20.0000 mg | ORAL_TABLET | Freq: Every day | ORAL | Status: DC
  Administered 2022-04-05 – 2022-04-07 (×3): 20 mg via ORAL
  Filled 2022-04-05 (×3): qty 1

## 2022-04-05 MED ORDER — FINASTERIDE 5 MG PO TABS
5.0000 mg | ORAL_TABLET | Freq: Every day | ORAL | Status: DC
  Administered 2022-04-05 – 2022-04-07 (×3): 5 mg via ORAL
  Filled 2022-04-05 (×3): qty 1

## 2022-04-05 MED ORDER — OMEGA-3-ACID ETHYL ESTERS 1 G PO CAPS
2.0000 | ORAL_CAPSULE | Freq: Every day | ORAL | Status: DC
  Administered 2022-04-05 – 2022-04-07 (×3): 2 g via ORAL
  Filled 2022-04-05 (×3): qty 2

## 2022-04-05 MED ORDER — MONTELUKAST SODIUM 10 MG PO TABS
10.0000 mg | ORAL_TABLET | Freq: Every day | ORAL | Status: DC
  Administered 2022-04-05 – 2022-04-06 (×2): 10 mg via ORAL
  Filled 2022-04-05 (×2): qty 1

## 2022-04-05 MED ORDER — ALPRAZOLAM 0.5 MG PO TABS
0.5000 mg | ORAL_TABLET | Freq: Once | ORAL | Status: AC
  Administered 2022-04-05: 0.5 mg via ORAL
  Filled 2022-04-05: qty 1

## 2022-04-05 MED ORDER — CHLORTHALIDONE 25 MG PO TABS
25.0000 mg | ORAL_TABLET | Freq: Every morning | ORAL | Status: DC
  Administered 2022-04-05 – 2022-04-07 (×3): 25 mg via ORAL
  Filled 2022-04-05 (×4): qty 1

## 2022-04-05 MED ORDER — AZITHROMYCIN 250 MG PO TABS
500.0000 mg | ORAL_TABLET | Freq: Every day | ORAL | Status: AC
  Administered 2022-04-05 – 2022-04-07 (×3): 500 mg via ORAL
  Filled 2022-04-05 (×3): qty 2

## 2022-04-05 NOTE — Evaluation (Signed)
Occupational Therapy Evaluation Patient Details Name: Wesley Ho MRN: [id number] DOB: May 31, 1955 Today's Date: 04/05/2022   History of Present Illness Wesley Ho is a 67 year old male with a history of diabetes mellitus type 2, COPD, DVT 09/2010, anxiety, poor compliance, alcohol abuse in remission, tobacco abuse in remission presenting with nausea, vomiting, and generalized weakness that began on 03/30/2022.  The patient states that he had some intermittent abdominal pain and loose stools.  The patient has a history of chronic loose stools.  He states that in the last 24 hours prior to admission he has only had 1 loose stool without hematochezia or melena.  There is no hematemesis or coffee-ground emesis.  He denies any fevers, chills, chest pain, hemoptysis, headache, neck pain.  He states that he quit drinking alcohol 3 years ago and quit smoking 10 years ago.   Clinical Impression   Pt agreeable to OT/PT co-evaluation today, reporting he has felt weak from being sick but is independent at baseline. Pt completing ADL and functional mobility tasks today, increased time required for tasks with SpO2 dropping to 79 at lowest with functional mobility, however pt reports he feels fine and no obvious SOB noted. Pt is at baseline with ADL completion, no further OT services required at this time.       Recommendations for follow up therapy are one component of a multi-disciplinary discharge planning process, led by the attending physician.  Recommendations may be updated based on patient status, additional functional criteria and insurance authorization.   Follow Up Recommendations  Precautions No OT follow up  [place] Recommended at Discharge PRN  Patient can return home with the following Assistance with cooking/housework    Functional Status Assessment  Patient has had a recent decline in their functional status and demonstrates the ability to make significant improvements in  function in a reasonable and predictable amount of time.  Equipment Recommendations  None recommended by OT    Recommendations for Other Services       Precautions / Restrictions Precautions Precautions: Fall Restrictions Weight Bearing Restrictions: No      Mobility Bed Mobility Overal bed mobility: Modified Independent                  Transfers Overall transfer level: Needs assistance Equipment used: Rolling walker (2 wheels) Transfers: Sit to/from Stand Sit to Stand: Min guard                      ADL either performed or assessed with clinical judgement   ADL Overall ADL's : Modified independent;At baseline Eating/Feeding: Independent                   Lower Body Dressing: Modified independent;Sitting/lateral leans Lower Body Dressing Details (indicate cue type and reason): seated on EOB donning socks, SOB however minimal difficulty with task Toilet Transfer: Copy Details (indicate cue type and reason): simulated with chair transfer         Functional mobility during ADLs: Supervision/safety;Min guard General ADL Comments: Pt functioning at baseline, SpO2 dropping as low as 79 on exertion      Pertinent Vitals/Pain Pain Assessment Pain Assessment: No/denies pain (states has a knot in his throat)     Hand Dominance Right   Extremity/Trunk Assessment Upper Extremity Assessment Upper Extremity Assessment: Defer to OT evaluation   Lower Extremity Assessment Lower Extremity Assessment: Overall WFL for tasks assessed   Cervical / Trunk Assessment Cervical /  Trunk Assessment: Normal   Communication Communication Communication: No difficulties   Cognition Arousal/Alertness: Awake/alert Behavior During Therapy: WFL for tasks assessed/performed Overall Cognitive Status: Within Functional Limits for tasks assessed                                                  Home Living  Family/patient expects to be discharged to:: Private residence Living Arrangements: Alone Available Help at Discharge: Family;Available PRN/intermittently Type of Home: Apartment Home Access: Stairs to enter Entrance Stairs-Number of Steps: 3 Entrance Stairs-Rails: Left (column) Home Layout: One level     Bathroom Shower/Tub: Occupational psychologist: Standard Bathroom Accessibility: Yes   Home Equipment: Conservation officer, nature (2 wheels);Shower seat;Cane - single point          Prior Functioning/Environment Prior Level of Function : Independent/Modified Independent             Mobility Comments: community ambulation ADLs Comments: independent with basic ADL        OT Problem List: Decreased activity tolerance                Co-evaluation PT/OT/SLP Co-Evaluation/Treatment: Yes Reason for Co-Treatment: Complexity of the patient's impairments (multi-system involvement) PT goals addressed during session: Mobility/safety with mobility;Balance;Proper use of DME;Strengthening/ROM OT goals addressed during session: ADL's and self-care      AM-PAC OT "6 Clicks" Daily Activity     Outcome Measure Help from another person eating meals?: None Help from another person taking care of personal grooming?: None Help from another person toileting, which includes using toliet, bedpan, or urinal?: None Help from another person bathing (including washing, rinsing, drying)?: None Help from another person to put on and taking off regular upper body clothing?: None Help from another person to put on and taking off regular lower body clothing?: None 6 Click Score: 24   End of Session Equipment Utilized During Treatment: Gait belt;Rolling walker (2 wheels) Nurse Communication: Mobility status;Other (comment) (Purewick off)  Activity Tolerance: Patient tolerated treatment well Patient left: in chair;with call bell/phone within reach;with nursing/sitter in room  OT Visit  Diagnosis: Muscle weakness (generalized) (M62.81)                Time: 9211-9417 OT Time Calculation (min): 32 min Charges:  OT General Charges $OT Visit: 1 Visit OT Evaluation $OT Eval Low Complexity: [place], OTR/L  [phone] 04/05/2022, 11:13 AM

## 2022-04-05 NOTE — Progress Notes (Signed)
PROGRESS NOTE  DELVON CHIPPS CBJ:[id number] DOB: 1955/09/19 DOA: 04/03/2022 PCP: Benita Stabile, MD  HPI/Recap of past 24 hours: Wesley Ho is a 67 year old male with a history of DM type 2, COPD, DVT 09/2010, anxiety, poor compliance, alcohol abuse in remission, tobacco abuse in remission presenting with nausea, vomiting, and generalized weakness that began on 03/30/2022.  The patient states that he had some intermittent abdominal pain and loose stools.  The patient has a history of chronic loose stools. He states that he quit drinking alcohol 3 years ago and quit smoking 10 years ago. Review of his medical record shows a history of poor compliance. In the ED, the patient was afebrile and hemodynamically stable with oxygen saturation 95-90% on room air.  WBC 26.2, hemoglobin 10.6, platelets 277,000.  Sodium 132, potassium 2.7, bicarbonate 10, serum creatinine 2.07 with anion gap 38.  EKG shows sinus rhythm with nonspecific ST changes.  VBG 7.1 7/40/35/14. CT of the abdomen and pelvis showed circumferential thickening of the lower esophagus with mild periesophageal stranding.  There is a small lobulated liver with fatty infiltration.  He is status postcholecystectomy without ductal dilatation.  Chest x-ray showed hyperinflation.  The patient was started on IV fluids and insulin drip.  GI was consulted to assist with management of his esophageal thickening.  Patient admitted for further management.    Today, patient denies any new complaints, reports feeling better    Assessment/Plan: Principal Problem:   DKA (diabetic ketoacidosis) (HCC) Active Problems:   Hyponatremia   ETOH abuse   COPD (chronic obstructive pulmonary disease) (HCC)   AKI (acute kidney injury) (HCC)   Lactic acidosis   Class 1 obesity   DKA, type 2 (HCC)   Abnormal CT scan, esophagus   Alcoholic cirrhosis of liver without ascites (HCC)   DKA Diabetes mellitus type 2, uncontrolled A1c 13.1 Patient is status post  insulin drip, will be switching to subcu insulin as anion gap has closed, bicarb has normalized and CBGs are controlled Patient with history of noncompliance, will need insulin subcu on discharge Switch to SSI, Semglee, Accu-Cheks, hypoglycemic protocol Advance diet Diabetes coordinator on board, appreciate recs  Acute kidney injury Resolved, likely 2/2 above S/p IV fluids  Campylobacter gastroenteritis Diarrhea improving, although on lactulose Stool analysis positive for Campylobacter Start azithromycin x 3 days  Hypokalemia Replace as needed  Leukocytosis Possibly due to gastroenteritis, afebrile UA unremarkable, chest x-ray with no acute findings, respiratory panel negative, C. difficile negative BC x 2 pending Procalcitonin 0.19 Daily CBC  Lactic acidosis Resolved Multifactorial including acute kidney injury, volume depletion, gastroenteritis Workup as above  History of liver cirrhosis Possibly due to alcohol versus NASH Noted transaminasemia/hyperbilirubinemia Viral hepatitis serologies negative CT noted cirrhosis of liver GI on board, follow-up as an outpatient for further workup  Esophageal wall thickening Noted on CT abdomen and pelvis Possibly reactive due to vomiting Continue PPI GI consulted, outpatient follow-up with GI for further workup including EGD  COPD Stable on room air  Mixed Hyperlipidemia Resume statins once able   Anxiety Patient on PTA Xanax 1 mg, pregabalin 50 mg  Obesity Lifestyle modification advised      Estimated body mass index is 32.88 kg/m as calculated from the following:   Height as of this encounter: 5\' 8"  (1.727 m).   Weight as of this encounter: 98.1 kg.     Code Status: Full  Family Communication: None at bedside  Disposition Plan: Status is: Inpatient Remains inpatient appropriate because:  Level of care      Consultants: GI  Procedures: None  Antimicrobials: None  DVT  prophylaxis: Lovenox   Objective: Vitals:   04/05/22 1200 04/05/22 1300 04/05/22 1400 04/05/22 1500  BP: 120/72 (!) 91/51 123/69 114/65  Pulse: 75 72 74 71  Resp:   17 15  Temp:      TempSrc:      SpO2: 90% 92% 92% 93%  Weight:      Height:        Intake/Output Summary (Last 24 hours) at 04/05/2022 1528 Last data filed at 04/05/2022 1104 Gross per 24 hour  Intake 2217.49 ml  Output 3600 ml  Net -1382.51 ml   Filed Weights   04/03/22 0536 04/03/22 1600  Weight: 90.7 kg 98.1 kg    Exam: General: NAD, appears deconditioned Cardiovascular: S1, S2 present Respiratory: CTAB Abdomen: Soft, nontender, nondistended, bowel sounds present Musculoskeletal: No bilateral pedal edema noted Skin: Normal Psychiatry: Normal mood    Data Reviewed: CBC: Recent Labs  Lab 04/03/22 0323 04/04/22 0415 04/05/22 0415  WBC 26.2* 20.1* 12.1*  NEUTROABS  --   --  8.1*  HGB 18.6* 13.7 12.3*  HCT 56.3* 40.9 36.8*  MCV 101.8* 100.2* 98.7  PLT 277 171 324*   Basic Metabolic Panel: Recent Labs  Lab 04/03/22 0839 04/03/22 1155 04/04/22 0004 04/04/22 0415 04/04/22 0802 04/04/22 0804 04/04/22 0938 04/04/22 1214 04/05/22 0415  NA 132*   < > 132* 133*  --  133*  --  135 133*  K 3.8   < > 3.1* 3.4*  --  3.4*  --  3.1* 2.9*  CL 91*   < > 101 103  --  102  --  103 98  CO2 11*   < > 20* 21*  --  20*  --  22 24  GLUCOSE 322*   < > 139* 134*  --  661* 169* 163* 161*  BUN 32*   < > 19 17  --  13  --  12 10  CREATININE 1.66*   < > 0.91 0.88  --  1.01  --  0.81 0.84  CALCIUM 8.4*   < > 7.9* 8.1*  --  7.6*  --  8.2* 8.0*  MG 2.7*  --   --   --  1.9  --   --   --  2.1   < > = values in this interval not displayed.   GFR: Estimated Creatinine Clearance: 95.6 mL/min (by C-G formula based on SCr of 0.84 mg/dL). Liver Function Tests: Recent Labs  Lab 04/03/22 0323 04/03/22 0839 04/04/22 0415 04/04/22 1214  AST 80*  --  27 35  ALT <5  --  24 25  ALKPHOS 120  --  61 61  BILITOT 2.4* 2.4*  1.2 1.2  PROT 9.7*  --  6.4* 6.6  ALBUMIN 4.1  --  2.8* 2.9*   Recent Labs  Lab 04/03/22 0323  LIPASE 29   Recent Labs  Lab 04/03/22 1302  AMMONIA 43*   Coagulation Profile: Recent Labs  Lab 04/03/22 0945 04/04/22 0415  INR 1.2 1.2   Cardiac Enzymes: No results for input(s): "CKTOTAL", "CKMB", "CKMBINDEX", "TROPONINI" in the last 168 hours. BNP (last 3 results) No results for input(s): "PROBNP" in the last 8760 hours. HbA1C: Recent Labs    04/03/22 0839  HGBA1C 13.1*   CBG: Recent Labs  Lab 04/04/22 1852 04/04/22 2059 04/05/22 0232 04/05/22 0719 04/05/22 1103  GLUCAP 189* 178* 173* 151*  228*   Lipid Profile: No results for input(s): "CHOL", "HDL", "LDLCALC", "TRIG", "CHOLHDL", "LDLDIRECT" in the last 72 hours. Thyroid Function Tests: No results for input(s): "TSH", "T4TOTAL", "FREET4", "T3FREE", "THYROIDAB" in the last 72 hours. Anemia Panel: Recent Labs    04/03/22 0839  VITAMINB12 550  FOLATE 10.7   Urine analysis:    Component Value Date/Time   COLORURINE YELLOW 04/03/2022 [place] 04/03/2022 0958   LABSPEC 1.022 04/03/2022 0958   PHURINE 5.0 04/03/2022 0958   GLUCOSEU >=500 (A) 04/03/2022 0958   HGBUR SMALL (A) 04/03/2022 0958   BILIRUBINUR NEGATIVE 04/03/2022 0958   KETONESUR 80 (A) 04/03/2022 0958   PROTEINUR 30 (A) 04/03/2022 0958   UROBILINOGEN 0.2 07/08/2014 1203   NITRITE NEGATIVE 04/03/2022 0958   LEUKOCYTESUR NEGATIVE 04/03/2022 0958   Sepsis Labs: @LABRCNTIP (procalcitonin:4,lacticidven:4)  ) Recent Results (from the past 240 hour(s))  Resp panel by RT-PCR (RSV, Flu A&B, Covid) Anterior Nasal Swab     Status: None   Collection Time: 04/03/22  3:26 AM   Specimen: Anterior Nasal Swab  Result Value Ref Range Status   SARS Coronavirus 2 by RT PCR NEGATIVE NEGATIVE Final    Comment: (NOTE) SARS-CoV-2 target nucleic acids are NOT DETECTED.  The SARS-CoV-2 RNA is generally detectable in upper respiratory specimens  during the acute phase of infection. The lowest concentration of SARS-CoV-2 viral copies this assay can detect is 138 copies/mL. A negative result does not preclude SARS-Cov-2 infection and should not be used as the sole basis for treatment or other patient management decisions. A negative result may occur with  improper specimen collection/handling, submission of specimen other than nasopharyngeal swab, presence of viral mutation(s) within the areas targeted by this assay, and inadequate number of viral copies(<138 copies/mL). A negative result must be combined with clinical observations, patient history, and epidemiological information. The expected result is Negative.  Fact Sheet for Patients:  EntrepreneurPulse.com.au  Fact Sheet for Healthcare Providers:  IncredibleEmployment.be  This test is no t yet approved or cleared by the [place] FDA and  has been authorized for detection and/or diagnosis of SARS-CoV-2 by FDA under an Emergency Use Authorization (EUA). This EUA will remain  in effect (meaning this test can be used) for the duration of the COVID-19 declaration under Section 564(b)(1) of the Act, 21 U.S.C.section 360bbb-3(b)(1), unless the authorization is terminated  or revoked sooner.       Influenza A by PCR NEGATIVE NEGATIVE Final   Influenza B by PCR NEGATIVE NEGATIVE Final    Comment: (NOTE) The Xpert Xpress SARS-CoV-2/FLU/RSV plus assay is intended as an aid in the diagnosis of influenza from Nasopharyngeal swab specimens and should not be used as a sole basis for treatment. Nasal washings and aspirates are unacceptable for Xpert Xpress SARS-CoV-2/FLU/RSV testing.  Fact Sheet for Patients: EntrepreneurPulse.com.au  Fact Sheet for Healthcare Providers: IncredibleEmployment.be  This test is not yet approved or cleared by the [place] FDA and has been authorized for detection  and/or diagnosis of SARS-CoV-2 by FDA under an Emergency Use Authorization (EUA). This EUA will remain in effect (meaning this test can be used) for the duration of the COVID-19 declaration under Section 564(b)(1) of the Act, 21 U.S.C. section 360bbb-3(b)(1), unless the authorization is terminated or revoked.     Resp Syncytial Virus by PCR NEGATIVE NEGATIVE Final    Comment: (NOTE) Fact Sheet for Patients: EntrepreneurPulse.com.au  Fact Sheet for Healthcare Providers: IncredibleEmployment.be  This test is not yet approved or cleared by  the [employer] and has been authorized for detection and/or diagnosis of SARS-CoV-2 by FDA under an Emergency Use Authorization (EUA). This EUA will remain in effect (meaning this test can be used) for the duration of the COVID-19 declaration under Section 564(b)(1) of the Act, 21 U.S.C. section 360bbb-3(b)(1), unless the authorization is terminated or revoked.  Performed at [facility], [address], [place], [place] [address]   Culture, blood (Routine X 2) w Reflex to ID Panel     Status: None (Preliminary result)   Collection Time: 04/03/22  8:39 AM   Specimen: BLOOD LEFT HAND  Result Value Ref Range Status   Specimen Description   Final    BLOOD LEFT HAND Performed at [facility], [address], [place], [place] [address]    Special Requests   Final    BOTTLES DRAWN AEROBIC AND ANAEROBIC Blood Culture adequate volume Performed at [facility], [address], [place], [place] [address]    Culture   Final    NO GROWTH 1 DAY Performed at [facility], [place] [address], [place], [place] [address]    Report Status PENDING  Incomplete  Culture, blood (Routine X 2) w Reflex to ID Panel     Status: None (Preliminary result)   Collection Time: 04/03/22  8:54 AM   Specimen: BLOOD RIGHT HAND  Result Value Ref Range Status   Specimen Description   Final    BLOOD RIGHT HAND Performed at [facility], [address], [place], [place] [address]    Special Requests   Final    BOTTLES DRAWN AEROBIC AND ANAEROBIC Blood Culture adequate volume Performed at [facility], [address] [place], [place] [address]    Culture   Final    NO GROWTH 1 DAY Performed at [facility], [place] [address], [place], [place] [address]    Report Status PENDING  Incomplete  MRSA Next Gen by PCR, Nasal     Status: Abnormal   Collection Time: 04/03/22  4:15 PM   Specimen: Nasal Mucosa; Nasal Swab  Result Value Ref Range Status   MRSA by PCR Next Gen DETECTED (A) NOT DETECTED Final    Comment: RESULT CALLED TO, READ BACK BY AND VERIFIED WITH: BHATTREI,S ON 04/03/22 AT 2235 BY LOY,C (NOTE) The GeneXpert MRSA Assay (FDA approved for NASAL specimens only), is one component of a comprehensive MRSA colonization surveillance program. It is not intended to diagnose MRSA infection nor to guide or monitor treatment for MRSA infections. Test performance is not FDA approved in patients less than 31 years old. Performed at [facility], [address] [place], [place] [address]   Gastrointestinal Panel by PCR , Stool     Status: Abnormal   Collection Time: 04/04/22 12:01 AM   Specimen: Stool  Result Value Ref Range Status   Campylobacter species DETECTED (A) NOT DETECTED Final    Comment: RESULT CALLED TO, READ BACK BY AND VERIFIED WITH: MEGAN GRAVLEY AT 2026 ON 04/04/22 BY SS    Plesimonas shigelloides NOT DETECTED NOT DETECTED Final   Salmonella species NOT DETECTED NOT DETECTED Final   Yersinia enterocolitica NOT DETECTED NOT DETECTED Final   Vibrio species NOT DETECTED NOT DETECTED Final   Vibrio cholerae NOT DETECTED NOT DETECTED Final   Enteroaggregative E coli (EAEC) NOT DETECTED NOT DETECTED Final   Enteropathogenic E coli (EPEC) NOT DETECTED NOT DETECTED Final   Enterotoxigenic E coli (ETEC) NOT DETECTED NOT DETECTED Final  Shiga like toxin producing E coli (STEC) NOT DETECTED NOT DETECTED  Final   Shigella/Enteroinvasive E coli (EIEC) NOT DETECTED NOT DETECTED Final   Cryptosporidium NOT DETECTED NOT DETECTED Final   Cyclospora cayetanensis NOT DETECTED NOT DETECTED Final   Entamoeba histolytica NOT DETECTED NOT DETECTED Final   Giardia lamblia NOT DETECTED NOT DETECTED Final   Adenovirus F40/41 NOT DETECTED NOT DETECTED Final   Astrovirus NOT DETECTED NOT DETECTED Final   Norovirus GI/GII NOT DETECTED NOT DETECTED Final   Rotavirus A NOT DETECTED NOT DETECTED Final   Sapovirus (I, II, IV, and V) NOT DETECTED NOT DETECTED Final    Comment: Performed at [facility], [address] [place], [place] [address]  C Difficile Quick Screen w PCR reflex     Status: None   Collection Time: 04/04/22 12:01 AM   Specimen: Stool  Result Value Ref Range Status   C Diff antigen NEGATIVE NEGATIVE Final   C Diff toxin NEGATIVE NEGATIVE Final   C Diff interpretation No C. difficile detected.  Final    Comment: Performed at [facility], [address] [place], [place] [address]      Studies: No results found.  Scheduled Meds:  aspirin EC  81 mg Oral Daily   atorvastatin  40 mg Oral Daily   azithromycin  500 mg Oral Daily   busPIRone  15 mg Oral TID   Chlorhexidine Gluconate Cloth  6 each Topical Q0600   chlorthalidone  25 mg Oral q morning   cyanocobalamin  1,000 mcg Oral Daily   DULoxetine  30 mg Oral BID   enoxaparin (LOVENOX) injection  40 mg Subcutaneous Q24H   finasteride  5 mg Oral Daily   furosemide  20 mg Oral Daily   insulin aspart  0-9 Units Subcutaneous TID WC   insulin glargine-yfgn  30 Units Subcutaneous Q24H   lactulose  20 g Oral BID   montelukast  10 mg Oral QHS   mupirocin ointment  1 Application Nasal BID   omega-3 acid ethyl esters  2 capsule Oral Daily   pantoprazole (PROTONIX) IV  40 mg Intravenous Q12H   pregabalin  50 mg Oral BID   propranolol  10 mg Oral BID    Continuous Infusions:  insulin Stopped (04/04/22 1730)     LOS: 2 days      Briant Cedar, MD [facility]  If 7PM-7AM, please contact night-coverage [email or web address] 04/05/2022, 3:28 PM

## 2022-04-05 NOTE — Progress Notes (Signed)
Met with patient today and he elects Karal Googe, his cousin to be his only Health Care Agent. In the event that he is unable to communicate his health care decisions and he has a condition that cannot be cured and that will result in his death in a relatively short period of time, he wishes that his life NOT be prolonged by life prolonging measures. Patient electronic chart updated today and document will be signed, notarized, and witnessed the following day due to volunteer availability.   Rev. Ainslie Mazurek Destony Prevost, M.Div Chaplain 

## 2022-04-05 NOTE — Plan of Care (Signed)
  Problem: Acute Rehab PT Goals(only PT should resolve) Goal: Patient Will Transfer Sit To/From Stand Outcome: Progressing Flowsheets (Taken 04/05/2022 1124) Patient will transfer sit to/from stand: with modified independence Goal: Pt Will Transfer Bed To Chair/Chair To Bed Outcome: Progressing Flowsheets (Taken 04/05/2022 1124) Pt will Transfer Bed to Chair/Chair to Bed: with modified independence Goal: Pt Will Ambulate Outcome: Progressing Flowsheets (Taken 04/05/2022 1124) Pt will Ambulate:  > 125 feet  with modified independence Goal: Pt/caregiver will Perform Home Exercise Program Outcome: Progressing Flowsheets (Taken 04/05/2022 1124) Pt/caregiver will Perform Home Exercise Program:  For increased strengthening  For improved balance  Independently  11:24 AM, 04/05/22 Mearl Latin PT, DPT Physical Therapist at [facility]

## 2022-04-05 NOTE — ACP (Advance Care Planning) (Signed)
Met with patient today and he elects Wesley Ho, his cousin to be his only [place]. In the event that he is unable to communicate his health care decisions and he has a condition that cannot be cured and that will result in his death in a relatively short period of time, he wishes that his life NOT be prolonged by life prolonging measures. Patient electronic chart updated today and document will be signed, notarized, and witnessed the following day due to volunteer availability.   Rev. Bennie Pierini, M.Div Chaplain

## 2022-04-05 NOTE — Progress Notes (Signed)
Gastroenterology Progress Note   Referring Provider: No ref. provider found Primary Care Physician:  Celene Squibb, MD Primary Gastroenterologist:  Dr. Jenetta Downer  Patient ID: Wesley Ho; [id number]; 09/01/1955   Subjective:    Feeling better. No further n/v. No abd pain. Passing stool. States he has some burning discomfort in the upper esophagus with eating/drinking. Feels like something sticks there.   Objective:   Vital signs in last 24 hours: Temp:  [97.7 F (36.5 C)-98.1 F (36.7 C)] 98.1 F (36.7 C) (02/01 0722) Pulse Rate:  [71-89] 86 (02/01 0800) Resp:  [13-22] 22 (02/01 0800) BP: (116-180)/(57-99) 128/99 (02/01 0800) SpO2:  [88 %-95 %] 91 % (02/01 0800) Last BM Date : 04/04/22 General:   Alert,  Well-developed, well-nourished, pleasant and cooperative in NAD Head:  Normocephalic and atraumatic. Eyes:  Sclera clear, no icterus.  Abdomen:  Soft, nontender and nondistended. Normal bowel sounds, without guarding, and without rebound.   Extremities:  Without clubbing, deformity or edema. Neurologic:  Alert and  oriented x4;  grossly normal neurologically. Skin:  Intact without significant lesions or rashes. Psych:  Alert and cooperative. Normal mood and affect.  Intake/Output from previous day: 01/31 0701 - 02/01 0700 In: 818.5 [P.O.:240; I.V.:578.5] Out: 3200 [Urine:3200] Intake/Output this shift: Total I/O In: -  Out: 400 [Urine:400]  Lab Results: CBC Recent Labs    04/03/22 0323 04/04/22 0415 04/05/22 0415  WBC 26.2* 20.1* 12.1*  HGB 18.6* 13.7 12.3*  HCT 56.3* 40.9 36.8*  MCV 101.8* 100.2* 98.7  PLT 277 171 138*   BMET Recent Labs    04/04/22 0415 04/04/22 0804 04/04/22 0938 04/04/22 1214  NA 133* 133*  --  135  K 3.4* 3.4*  --  3.1*  CL 103 102  --  103  CO2 21* 20*  --  22  GLUCOSE 134* 661* 169* 163*  BUN 17 13  --  12  CREATININE 0.88 1.01  --  0.81  CALCIUM 8.1* 7.6*  --  8.2*   LFTs Recent Labs    04/03/22 0323  04/03/22 0839 04/04/22 0415 04/04/22 1214  BILITOT 2.4* 2.4* 1.2 1.2  BILIDIR  --  0.3*  --   --   IBILI  --  2.1*  --   --   ALKPHOS 120  --  61 61  AST 80*  --  27 35  ALT <5  --  24 25  PROT 9.7*  --  6.4* 6.6  ALBUMIN 4.1  --  2.8* 2.9*   Recent Labs    04/03/22 0323  LIPASE 29   PT/INR Recent Labs    04/03/22 0945 04/04/22 0415  LABPROT 15.0 14.9  INR 1.2 1.2         Imaging Studies: US Abdomen Limited RUQ (LIVER/GB)  Result Date: 04/03/2022 CLINICAL DATA:  Elevated liver function studies. EXAM: ULTRASOUND ABDOMEN LIMITED RIGHT UPPER QUADRANT COMPARISON:  CT scan 04/03/2022 FINDINGS: Gallbladder: Surgically absent. Common bile duct: Diameter: 4.6 mm Liver: Cirrhotic changes involving the liver with a very irregular liver contour. Increased coarse echogenicity with poor through transmission and poor definition of the liver architecture likely a combination of cirrhosis and fatty infiltration. No hepatic lesions are identified. No intrahepatic biliary dilatation. Portal vein is patent on color Doppler imaging with normal direction of blood flow towards the liver. Other: None. IMPRESSION: 1. Status post cholecystectomy. No biliary dilatation. 2. Cirrhotic changes involving the liver but no obvious hepatic lesions. Electronically Signed   By: Mamie Nick.  Gallerani M.D.   On: 04/03/2022 09:30   DG Chest Port 1 View  Result Date: 04/03/2022 CLINICAL DATA:  Chest pain EXAM: PORTABLE CHEST 1 VIEW COMPARISON:  09/02/2019 FINDINGS: Chronic interstitial coarsening from emphysema by 2018 chest CT. There is no edema, consolidation, effusion, or pneumothorax. Normal heart size and stable mediastinal contours. Artifact from EKG leads. IMPRESSION: No evidence of acute disease. Emphysema Electronically Signed   By: Jorje Guild M.D.   On: 04/03/2022 04:25   CT ABDOMEN PELVIS WO CONTRAST  Result Date: 04/03/2022 CLINICAL DATA:  Acute, nonlocalized abdominal pain EXAM: CT ABDOMEN AND PELVIS  WITHOUT CONTRAST TECHNIQUE: Multidetector CT imaging of the abdomen and pelvis was performed following the standard protocol without IV contrast. RADIATION DOSE REDUCTION: This exam was performed according to the departmental dose-optimization program which includes automated exposure control, adjustment of the mA and/or kV according to patient size and/or use of iterative reconstruction technique. COMPARISON:  11/20/2016 FINDINGS: Lower chest: Motion artifact. Coronary atherosclerosis. Circumferential thickened appearance of the lower esophagus with possible mild periesophageal fat stranding. Hepatobiliary: Small and lobulated liver with fatty infiltration.Cholecystectomy without bile duct dilatation Pancreas: Unremarkable. Spleen: Unremarkable. Adrenals/Urinary Tract: Negative adrenals. No hydronephrosis or stone. Unremarkable bladder. Stomach/Bowel:  No obstruction. No appendicitis. Vascular/Lymphatic: No acute vascular abnormality. Extensive atheromatous calcification. No mass or adenopathy. Reproductive:No pathologic findings. Other: No ascites or pneumoperitoneum. Fatty enlargement of the left more than right inguinal canal. Musculoskeletal: No acute abnormalities. Ordinary lumbar spine degeneration IMPRESSION: 1. No acute intra-abdominal finding. 2. Circumferential thickened appearance of the lower esophagus, correlate for esophagitis symptoms. 3. Cirrhosis and extensive atherosclerosis. Electronically Signed   By: Jorje Guild M.D.   On: 04/03/2022 04:19  [2 weeks]  Assessment:  67 y.o. year old male with history of COPD, diabetes, DVT in 2012, anxiety, alcohol abuse in remission, cirrhosis, who presented to the emergency room with nausea, vomiting, generalized weakness, found to be in DKA and with AKI. GI consulted due to incidental findings of esophageal wall thickening on CT. Notably, cirrhosis also noted on imaging.    Esophageal wall thickening:  Initially patient denied dysphagia/odynophagia,  history of GERD, unintentional weight loss, or Fhx of esophageal cancer. Today he states he has had some burning in the upper esophagus with eating/drinking and it feels like something sticks. He does take Naproxen chronically. Differential includes pill induced esophagitis, esophagitis secondary to silent GERD, esophageal candidiasis, and can't rule out malignancy. He will need EGD for further evaluation, but this can be completed in the outpatient setting.   Cirrhosis: Cirrhosis noted on CT and U/S this admission.Notably, these findings date back at least to 2018.  He has not followed with GI for his cirrhosis.  Likely alcohol in etiology, now sober for 3 years. MELD 3.0 was 23 on presentation driven by AKI and elevated bilirubin though mostly indirect. Yesterday MELD 3.0 was 13. Started on lactulose this admission for concerns for mild HE and elevated ammonia. He will need first ever EGD to screen for esophageal varices and additional work-up to exclude other etiologies of cirrhosis as an outpatient.    Fluctuating bowel habits: Stools range from bristol 1 to 7 this admission, limited number per day. Only one bristol 7 stool yesterday. On lactulose 20g BID. Cdiff quick screen negative. Campylobacter positive. We will treat given multiple comorbidities and increased risk.      Plan:   Continue lactulose titrate to 3-4 stools daily. Continue PPI BID, can transition to oral. Outpatient EGD for esophageal wall thickening/variceal screening.  Outpatient cirrhosis care.  Continue Etoh abstinence. Avoid NSAIDS. 2g Na diet.  Azithromycin 500mg  daily for 3 days. GI signing off. Call with any questions.    LOS: 2 days   Laureen Ochs. Bernarda Caffey [facility] [phone] 2/1/20248:34 AM

## 2022-04-05 NOTE — Inpatient Diabetes Management (Addendum)
Inpatient Diabetes Program Recommendations  AACE/ADA: New Consensus Statement on Inpatient Glycemic Control (2015)  Target Ranges:  Prepandial:   less than 140 mg/dL      Peak postprandial:   less than 180 mg/dL (1-2 hours)      Critically ill patients:  140 - 180 mg/dL   Lab Results  Component Value Date   GLUCAP 228 (H) 04/05/2022   HGBA1C 13.1 (H) 04/03/2022    Review of Glycemic Control  Latest Reference Range & Units 04/05/22 07:19 04/05/22 11:03  Glucose-Capillary 70 - 99 mg/dL 151 (H) 228 (H)  (H): Data is abnormally high  Diabetes history: DM2 Outpatient Diabetes medications: Glucotrol 5 mg bid, Metformin 500 mg qd, Jardiance 10 mg QD Current orders for Inpatient glycemic control: Semglee 30 units QD, Novolog 0-9 units TID    Inpatient Diabetes Program Recommendations:    Attempted to reach patient by cell phone and hospital phone  x 2. Reached out to rN to help assist. Will continue to attempt.   Addendum: Spoke with patient regarding outpatient diabetes management.  Reviewed patient's current A1c of 13.7%. Explained what a A1c is and what it measures. Also reviewed goal A1c with patient, importance of good glucose control @ home, and blood sugar goals. Reviewed patho of DM, role of pancreas, DKA, role of pancreas, survival skills, interventions, CGMs, basal insulin, need for improved control, vascular changes and commorbidities.  Patient will need a meter at discharge. Reviewed at length when to reach out to MD. Patient has follow up scheduled. Discussed use of CGMs and patient is interested. Will verify benefits coverage and encouraged patient to reach out to PCP for prescription.  Educated patient on insulin pen use at home. Reviewed contents of insulin flexpen starter kit. Reviewed all steps if insulin pen including attachment of needle, 2-unit air shot, dialing up dose, giving injection, removing needle, disposal of sharps, storage of unused insulin, disposal of insulin  etc. Patient to begin working with nursing on return demonstration. Has felt nervous in the past about this skill, but understands necessity and is willing to self inject. Also reviewed troubleshooting with insulin pen. MD to give patient Rxs for insulin pens and insulin pen needles. [facility] consult placed and additional educational videos sent to patient. Secure chat sent to RN to further reinforce, work with patient on self injection, and ensure materials have been provided.   Additionally, secure chat sent to pharmacy to verify benefits for basal insulin and CGMs. No response, however DM team to follow up on 2/2.  Thanks, Bronson Curb, MSN, RNC-OB Diabetes Coordinator [phone] (8a-5p)

## 2022-04-05 NOTE — Telephone Encounter (Signed)
Patient needs follow up OV with Dr. Jenetta Downer, Cyril Mourning, [place], or me in 2-3 weeks for cirrhosis, abnormal esophagus on CT.

## 2022-04-05 NOTE — Evaluation (Signed)
Physical Therapy Evaluation Patient Details Name: Wesley Ho MRN: [id number] DOB: May 03, 1955 Today's Date: 04/05/2022  History of Present Illness  Wesley Ho is a 67 year old male with a history of diabetes mellitus type 2, COPD, DVT 09/2010, anxiety, poor compliance, alcohol abuse in remission, tobacco abuse in remission presenting with nausea, vomiting, and generalized weakness that began on 03/30/2022.  The patient states that he had some intermittent abdominal pain and loose stools.  The patient has a history of chronic loose stools.  He states that in the last 24 hours prior to admission he has only had 1 loose stool without hematochezia or melena.  There is no hematemesis or coffee-ground emesis.  He denies any fevers, chills, chest pain, hemoptysis, headache, neck pain.  He states that he quit drinking alcohol 3 years ago and quit smoking 10 years ago.   Clinical Impression  Patient limited for functional mobility as stated below secondary to slight BLE weakness, fatigue and impaired balance. Patient does not require assist with bed mobility and demonstrates good sitting balance and sitting tolerance at bedside while assisted with donning compression socks. Patient transfers to standing and ambulates with RW on room air with O2 sat ranging from high 80s to 79% without SOB. Patient ends session seated in chair and is educated on calling for nursing to use the bathroom rather than using purewick as he is able to ambulate with assist. Patient will benefit from continued physical therapy in hospital and recommended venue below to increase strength, balance, endurance for safe ADLs and gait.        Recommendations for follow up therapy are one component of a multi-disciplinary discharge planning process, led by the attending physician.  Recommendations may be updated based on patient status, additional functional criteria and insurance authorization.  Follow Up Recommendations Home health PT       Assistance Recommended at Discharge PRN  Patient can return home with the following  Assistance with cooking/housework    Equipment Recommendations None recommended by PT  Recommendations for Other Services       Functional Status Assessment Patient has had a recent decline in their functional status and demonstrates the ability to make significant improvements in function in a reasonable and predictable amount of time.     Precautions / Restrictions Precautions Precautions: Fall Restrictions Weight Bearing Restrictions: No      Mobility  Bed Mobility Overal bed mobility: Needs Assistance Bed Mobility: Supine to Sit     Supine to sit: Modified independent (Device/Increase time), HOB elevated     General bed mobility comments: slightly labored    Transfers Overall transfer level: Needs assistance Equipment used: Rolling walker (2 wheels) Transfers: Sit to/from Stand, Bed to chair/wheelchair/BSC Sit to Stand: Min guard   Step pivot transfers: Min guard       General transfer comment: transfer to standing with RW, guard for balance/safety    Ambulation/Gait Ambulation/Gait assistance: Min guard Gait Distance (Feet): 200 Feet Assistive device: Rolling walker (2 wheels) Gait Pattern/deviations: Step-through pattern, Decreased stride length Gait velocity: decreased     General Gait Details: ambulates with RW, O2 sat varies from high 80s to 79% on room air, improves with cueing for breathing  Stairs            Wheelchair Mobility    Modified Rankin (Stroke Patients Only)       Balance Overall balance assessment: Needs assistance Sitting-balance support: Feet unsupported, Bilateral upper extremity supported Sitting balance-Leahy Scale: Good Sitting balance -  Comments: seated EOB   Standing balance support: Bilateral upper extremity supported Standing balance-Leahy Scale: Good Standing balance comment: with RW                              Pertinent Vitals/Pain Pain Assessment Pain Assessment: No/denies pain    Home Living Family/patient expects to be discharged to:: Private residence Living Arrangements: Alone Available Help at Discharge: Family;Available PRN/intermittently Type of Home: Apartment Home Access: Stairs to enter Entrance Stairs-Rails: Left (column) Entrance Stairs-Number of Steps: 3   Home Layout: One level Home Equipment: Conservation officer, nature (2 wheels);Shower seat;Cane - single point      Prior Function Prior Level of Function : Independent/Modified Independent             Mobility Comments: community ambulation ADLs Comments: independent with basic ADL     Hand Dominance   Dominant Hand: Right    Extremity/Trunk Assessment   Upper Extremity Assessment Upper Extremity Assessment: Defer to OT evaluation    Lower Extremity Assessment Lower Extremity Assessment: Overall WFL for tasks assessed    Cervical / Trunk Assessment Cervical / Trunk Assessment: Normal  Communication   Communication: No difficulties  Cognition Arousal/Alertness: Awake/alert Behavior During Therapy: WFL for tasks assessed/performed Overall Cognitive Status: Within Functional Limits for tasks assessed                                          General Comments      Exercises     Assessment/Plan    PT Assessment Patient needs continued PT services  PT Problem List Decreased strength;Decreased activity tolerance;Decreased balance;Decreased mobility;Cardiopulmonary status limiting activity       PT Treatment Interventions DME instruction;Therapeutic exercise;Gait training;Balance training;Stair training;Neuromuscular re-education;Functional mobility training;Therapeutic activities;Patient/family education    PT Goals (Current goals can be found in the Care Plan section)  Acute Rehab PT Goals Patient Stated Goal: return home PT Goal Formulation: With patient Time For Goal  Achievement: 04/19/22 Potential to Achieve Goals: Good    Frequency Min 3X/week     Co-evaluation PT/OT/SLP Co-Evaluation/Treatment: Yes Reason for Co-Treatment: Complexity of the patient's impairments (multi-system involvement) PT goals addressed during session: Mobility/safety with mobility;Balance;Proper use of DME;Strengthening/ROM OT goals addressed during session: ADL's and self-care       AM-PAC PT "6 Clicks" Mobility  Outcome Measure Help needed turning from your back to your side while in a flat bed without using bedrails?: None Help needed moving from lying on your back to sitting on the side of a flat bed without using bedrails?: A Little Help needed moving to and from a bed to a chair (including a wheelchair)?: A Little Help needed standing up from a chair using your arms (e.g., wheelchair or bedside chair)?: A Little Help needed to walk in hospital room?: A Little Help needed climbing 3-5 steps with a railing? : A Little 6 Click Score: 19    End of Session Equipment Utilized During Treatment: Gait belt Activity Tolerance: Patient tolerated treatment well Patient left: in chair;with nursing/sitter in room;with call bell/phone within reach Nurse Communication: Mobility status PT Visit Diagnosis: Unsteadiness on feet (R26.81);Other abnormalities of gait and mobility (R26.89);Muscle weakness (generalized) (M62.81)    Time: 1829-9371 PT Time Calculation (min) (ACUTE ONLY): 31 min   Charges:   PT Evaluation $PT Eval Low Complexity: 1 Low PT Treatments $  Therapeutic Activity: 8-22 mins        11:23 AM, 04/05/22 Mearl Latin PT, DPT Physical Therapist at [facility]

## 2022-04-06 ENCOUNTER — Telehealth: Payer: Self-pay

## 2022-04-06 ENCOUNTER — Other Ambulatory Visit (HOSPITAL_COMMUNITY): Payer: Self-pay

## 2022-04-06 DIAGNOSIS — K7030 Alcoholic cirrhosis of liver without ascites: Secondary | ICD-10-CM | POA: Diagnosis not present

## 2022-04-06 DIAGNOSIS — J441 Chronic obstructive pulmonary disease with (acute) exacerbation: Secondary | ICD-10-CM

## 2022-04-06 DIAGNOSIS — E1110 Type 2 diabetes mellitus with ketoacidosis without coma: Secondary | ICD-10-CM | POA: Diagnosis not present

## 2022-04-06 DIAGNOSIS — R933 Abnormal findings on diagnostic imaging of other parts of digestive tract: Secondary | ICD-10-CM | POA: Diagnosis not present

## 2022-04-06 DIAGNOSIS — J449 Chronic obstructive pulmonary disease, unspecified: Secondary | ICD-10-CM | POA: Diagnosis not present

## 2022-04-06 LAB — GLUCOSE, CAPILLARY
Glucose-Capillary: 164 mg/dL — ABNORMAL HIGH (ref 70–99)
Glucose-Capillary: 166 mg/dL — ABNORMAL HIGH (ref 70–99)
Glucose-Capillary: 158 mg/dL — ABNORMAL HIGH (ref 70–99)
Glucose-Capillary: 167 mg/dL — ABNORMAL HIGH (ref 70–99)

## 2022-04-06 LAB — CBC WITH DIFFERENTIAL/PLATELET
Abs Immature Granulocytes: 0.02 10*3/uL (ref 0.00–0.07)
Basophils Absolute: 0 10*3/uL (ref 0.0–0.1)
Basophils Relative: 0 %
Eosinophils Absolute: 0.1 10*3/uL (ref 0.0–0.5)
Eosinophils Relative: 1 %
HCT: 38.4 % — ABNORMAL LOW (ref 39.0–52.0)
Hemoglobin: 12.9 g/dL — ABNORMAL LOW (ref 13.0–17.0)
Immature Granulocytes: 0 %
Lymphocytes Relative: 30 %
Lymphs Abs: 2.8 10*3/uL (ref 0.7–4.0)
MCH: 33.3 pg (ref 26.0–34.0)
MCHC: 33.6 g/dL (ref 30.0–36.0)
MCV: 99.2 fL (ref 80.0–100.0)
Monocytes Absolute: 0.9 10*3/uL (ref 0.1–1.0)
Monocytes Relative: 10 %
Neutro Abs: 5.5 10*3/uL (ref 1.7–7.7)
Neutrophils Relative %: 59 %
Platelets: 143 10*3/uL — ABNORMAL LOW (ref 150–400)
RBC: 3.87 MIL/uL — ABNORMAL LOW (ref 4.22–5.81)
RDW: 13.7 % (ref 11.5–15.5)
WBC: 9.4 10*3/uL (ref 4.0–10.5)
nRBC: 0 % (ref 0.0–0.2)

## 2022-04-06 LAB — BASIC METABOLIC PANEL
Anion gap: 6 (ref 5–15)
Anion gap: 14 (ref 5–15)
BUN: 10 mg/dL (ref 8–23)
BUN: 9 mg/dL (ref 8–23)
CO2: 30 mmol/L (ref 22–32)
CO2: 30 mmol/L (ref 22–32)
Calcium: 8 mg/dL — ABNORMAL LOW (ref 8.9–10.3)
Calcium: 8.8 mg/dL — ABNORMAL LOW (ref 8.9–10.3)
Chloride: 96 mmol/L — ABNORMAL LOW (ref 98–111)
Chloride: 92 mmol/L — ABNORMAL LOW (ref 98–111)
Creatinine, Ser: 0.73 mg/dL (ref 0.61–1.24)
Creatinine, Ser: 0.8 mg/dL (ref 0.61–1.24)
GFR, Estimated: >60 mL/min (ref >60)
GFR, Estimated: >60 mL/min (ref >60)
Glucose, Bld: 145 mg/dL — ABNORMAL HIGH (ref 70–99)
Glucose, Bld: 143 mg/dL — ABNORMAL HIGH (ref 70–99)
Potassium: 2.5 mmol/L — CL (ref 3.5–5.1)
Potassium: 3.1 mmol/L — ABNORMAL LOW (ref 3.5–5.1)
Sodium: 132 mmol/L — ABNORMAL LOW (ref 135–145)
Sodium: 136 mmol/L (ref 135–145)

## 2022-04-06 MED ORDER — POTASSIUM CHLORIDE CRYS ER 20 MEQ PO TBCR
60.0000 meq | EXTENDED_RELEASE_TABLET | Freq: Once | ORAL | Status: AC
  Administered 2022-04-06: 60 meq via ORAL
  Filled 2022-04-06: qty 3

## 2022-04-06 MED ORDER — POTASSIUM CHLORIDE CRYS ER 20 MEQ PO TBCR
40.0000 meq | EXTENDED_RELEASE_TABLET | Freq: Once | ORAL | Status: AC
  Administered 2022-04-06: 40 meq via ORAL
  Filled 2022-04-06: qty 2

## 2022-04-06 NOTE — Progress Notes (Signed)
Pt demonstrated how to use the Flex pen on the practice simulation kit.  Hands have a mild to moderate tremor intermittently, which caused a little difficulty in screwing the needle cap on and off. Pt will need explanation of Freestyle Libre in detail.  Pt does not have that yet.  Notified Md and Diabetic Coordinator.

## 2022-04-06 NOTE — Inpatient Diabetes Management (Deleted)
m °
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
## 2022-04-06 NOTE — Patient Outreach (Signed)
Received a referral/order from Jonette Mate, RN at [facility]. I have sent a referral to the Care Guide Team to assign a Nurse Care Coordinator.     [place] [facility], [place], [place] [facility] [phone]

## 2022-04-06 NOTE — Inpatient Diabetes Management (Signed)
Inpatient Diabetes Program Recommendations  AACE/ADA: New Consensus Statement on Inpatient Glycemic Control (2015)  Target Ranges:  Prepandial:   less than 140 mg/dL      Peak postprandial:   less than 180 mg/dL (1-2 hours)      Critically ill patients:  140 - 180 mg/dL   Lab Results  Component Value Date   GLUCAP 164 (H) 04/06/2022   HGBA1C 13.1 (H) 04/03/2022    Review of Glycemic Control  Latest Reference Range & Units 04/05/22 07:19 04/05/22 11:03 04/05/22 16:02 04/05/22 19:18 04/06/22 07:30  Glucose-Capillary 70 - 99 mg/dL 151 (H) 228 (H) 159 (H) 155 (H) 164 (H)   Diabetes history: DM 2 Outpatient Diabetes medications: Glucotrol 5 mg bid, Metformin 500 mg qd  Current orders for Inpatient glycemic control:  Semglee 30 units Novolog 0-9 units tid   A1c 13.1% this admission Will be new to insulin   Spoke with pt over the phone regarding use of insulin pen. Pt HOH. Reviewed the steps for operating and administering insulin via insulin pen. Pt reports that he does not think he will be consistent in sticking his fingers for glucose checks. I asked pharmacy to run benefits check on CGMs. The Freestyle libre 3 is $0 copay. Will send video to pt on how to apply sensor.  Spoke with Ladell Heads, RN regarding insulin teaching at bedside. Pt to self inject prior to d/c. Will also try to have pt watch video on how to operate and inject insulin pen and apply CGM prior to d/c if unable to obtain the demo insulin pen for teaching on the nursing unit.   2  -  Freestyle Libre 3 sensors order [phone] Lantus solostar insulin pen 30 units order [id number] Insulin pen needles order # [id number]  Thanks,  Tama Headings RN, MSN, BC-ADM Inpatient Diabetes Coordinator Team Pager [phone] (8a-5p)

## 2022-04-06 NOTE — Progress Notes (Signed)
PROGRESS NOTE  FOCH ROSENWALD PZW:[id number] DOB: Sep 23, 1955 DOA: 04/03/2022 PCP: Benita Stabile, MD  HPI/Recap of past 24 hours: Wesley Ho is a 67 year old male with a history of DM type 2, COPD, DVT 09/2010, anxiety, poor compliance, alcohol abuse in remission, tobacco abuse in remission presenting with nausea, vomiting, and generalized weakness that began on 03/30/2022.  The patient states that he had some intermittent abdominal pain and loose stools.  The patient has a history of chronic loose stools. He states that he quit drinking alcohol 3 years ago and quit smoking 10 years ago. Review of his medical record shows a history of poor compliance. In the ED, the patient was afebrile and hemodynamically stable with oxygen saturation 95-90% on room air.  WBC 26.2, hemoglobin 10.6, platelets 277,000.  Sodium 132, potassium 2.7, bicarbonate 10, serum creatinine 2.07 with anion gap 38.  EKG shows sinus rhythm with nonspecific ST changes.  VBG 7.1 7/40/35/14. CT of the abdomen and pelvis showed circumferential thickening of the lower esophagus with mild periesophageal stranding.  There is a small lobulated liver with fatty infiltration.  He is status postcholecystectomy without ductal dilatation.  Chest x-ray showed hyperinflation.  The patient was started on IV fluids and insulin drip.  GI was consulted to assist with management of his esophageal thickening.  Patient admitted for further management.    Today, patient reported feeling better, more alert, more conversant.  Patient will need insulin upon discharge due to his A1c above 10.  RN at bedside has been working with patient to be able to use an insulin pen once a day as well as to set up freestyle libre continuous glucose monitoring to be able to check his sugars as he has not been consistent with sticking his fingers.  Recommend home health therapy as well as RN and possibly an aide to assist in his overall health management at home.  Patient does  depend on his ex-wife sometimes for transportation etc.  Repeat potassium came back late this evening, slightly low at 3.1, will continue to replace and plan for discharge on 04/07/2022 am    Assessment/Plan: Principal Problem:   DKA (diabetic ketoacidosis) (HCC) Active Problems:   Hyponatremia   ETOH abuse   COPD (chronic obstructive pulmonary disease) (HCC)   AKI (acute kidney injury) (HCC)   Lactic acidosis   Class 1 obesity   DKA, type 2 (HCC)   Abnormal CT scan, esophagus   Alcoholic cirrhosis of liver without ascites (HCC)   Nausea and vomiting   DKA Diabetes mellitus type 2, uncontrolled A1c 13.1 Patient is status post insulin drip, will be switching to subcu insulin as anion gap has closed, bicarb has normalized and CBGs are controlled Patient with history of noncompliance, will need insulin subcu on discharge Switch to SSI, Semglee, Accu-Cheks, hypoglycemic protocol Diabetes coordinator on board, appreciate recs  Acute kidney injury Resolved, likely 2/2 above S/p IV fluids  Campylobacter gastroenteritis Diarrhea improving, although on lactulose Stool analysis positive for Campylobacter Start azithromycin x 3 days  Hypokalemia Replaced as needed Will need some supplements on discharge  Leukocytosis Resolved Possibly due to gastroenteritis, afebrile UA unremarkable, chest x-ray with no acute findings, respiratory panel negative, C. difficile negative BC x 2 NGTD Procalcitonin 0.19  Lactic acidosis Resolved Multifactorial including acute kidney injury, volume depletion, gastroenteritis Workup as above  History of liver cirrhosis Possibly due to alcohol versus NASH Noted transaminasemia/hyperbilirubinemia Viral hepatitis serologies negative CT noted cirrhosis of liver GI on board,  follow-up as an outpatient for further workup  Esophageal wall thickening Noted on CT abdomen and pelvis Possibly reactive due to vomiting Continue PPI GI consulted,  outpatient follow-up with GI for further workup including EGD  COPD Stable on room air  Mixed Hyperlipidemia Resume statins once able   Anxiety Patient on PTA Xanax 1 mg, pregabalin 50 mg  Obesity Lifestyle modification advised      Estimated body mass index is 32.88 kg/m as calculated from the following:   Height as of this encounter: 5\' 8"  (1.727 m).   Weight as of this encounter: 98.1 kg.     Code Status: Full  Family Communication: None at bedside  Disposition Plan: Status is: Inpatient Remains inpatient appropriate because: Level of care      Consultants: GI  Procedures: None  Antimicrobials: Azithromycin  DVT prophylaxis: Lovenox   Objective: Vitals:   04/06/22 0318 04/06/22 0500 04/06/22 0600 04/06/22 1353  BP:  139/88 (!) 146/77 138/72  Pulse:  66 69 62  Resp:  17 16 17   Temp: 98.4 F (36.9 C)   98.5 F (36.9 C)  TempSrc: Oral   Oral  SpO2:  93% 93% 93%  Weight:      Height:        Intake/Output Summary (Last 24 hours) at 04/06/2022 1834 Last data filed at 04/06/2022 1752 Gross per 24 hour  Intake 720 ml  Output 2900 ml  Net -2180 ml   Filed Weights   04/03/22 0536 04/03/22 1600  Weight: 90.7 kg 98.1 kg    Exam: General: NAD Cardiovascular: S1, S2 present Respiratory: CTAB Abdomen: Soft, nontender, nondistended, bowel sounds present Musculoskeletal: No bilateral pedal edema noted Skin: Normal Psychiatry: Normal mood    Data Reviewed: CBC: Recent Labs  Lab 04/03/22 0323 04/04/22 0415 04/05/22 0415 04/06/22 0417  WBC 26.2* 20.1* 12.1* 9.4  NEUTROABS  --   --  8.1* 5.5  HGB 18.6* 13.7 12.3* 12.9*  HCT 56.3* 40.9 36.8* 38.4*  MCV 101.8* 100.2* 98.7 99.2  PLT 277 171 138* 644*   Basic Metabolic Panel: Recent Labs  Lab 04/03/22 0839 04/03/22 1155 04/04/22 0415 04/04/22 0802 04/04/22 0804 04/04/22 0938 04/04/22 1214 04/05/22 0415 04/06/22 0417  NA 132*   < > 133*  --  133*  --  135 133* 132*  K 3.8   < > 3.4*   --  3.4*  --  3.1* 2.9* 2.5*  CL 91*   < > 103  --  102  --  103 98 96*  CO2 11*   < > 21*  --  20*  --  22 24 30   GLUCOSE 322*   < > 134*  --  661* 169* 163* 161* 145*  BUN 32*   < > 17  --  13  --  12 10 10   CREATININE 1.66*   < > 0.88  --  1.01  --  0.81 0.84 0.73  CALCIUM 8.4*   < > 8.1*  --  7.6*  --  8.2* 8.0* 8.0*  MG 2.7*  --   --  1.9  --   --   --  2.1  --    < > = values in this interval not displayed.   GFR: Estimated Creatinine Clearance: 100.4 mL/min (by C-G formula based on SCr of 0.73 mg/dL). Liver Function Tests: Recent Labs  Lab 04/03/22 0323 04/03/22 0839 04/04/22 0415 04/04/22 1214  AST 80*  --  27 35  ALT <5  --  24 25  ALKPHOS 120  --  61 61  BILITOT 2.4* 2.4* 1.2 1.2  PROT 9.7*  --  6.4* 6.6  ALBUMIN 4.1  --  2.8* 2.9*   Recent Labs  Lab 04/03/22 0323  LIPASE 29   Recent Labs  Lab 04/03/22 1302  AMMONIA 43*   Coagulation Profile: Recent Labs  Lab 04/03/22 0945 04/04/22 0415  INR 1.2 1.2   Cardiac Enzymes: No results for input(s): "CKTOTAL", "CKMB", "CKMBINDEX", "TROPONINI" in the last 168 hours. BNP (last 3 results) No results for input(s): "PROBNP" in the last 8760 hours. HbA1C: No results for input(s): "HGBA1C" in the last 72 hours.  CBG: Recent Labs  Lab 04/05/22 1602 04/05/22 1918 04/06/22 0730 04/06/22 1109 04/06/22 1606  GLUCAP 159* 155* 164* 166* 158*   Lipid Profile: No results for input(s): "CHOL", "HDL", "LDLCALC", "TRIG", "CHOLHDL", "LDLDIRECT" in the last 72 hours. Thyroid Function Tests: No results for input(s): "TSH", "T4TOTAL", "FREET4", "T3FREE", "THYROIDAB" in the last 72 hours. Anemia Panel: No results for input(s): "VITAMINB12", "FOLATE", "FERRITIN", "TIBC", "IRON", "RETICCTPCT" in the last 72 hours.  Urine analysis:    Component Value Date/Time   COLORURINE YELLOW 04/03/2022 0958   APPEARANCEUR CLEAR 04/03/2022 0958   LABSPEC 1.022 04/03/2022 0958   PHURINE 5.0 04/03/2022 0958   GLUCOSEU >=500 (A)  04/03/2022 0958   HGBUR SMALL (A) 04/03/2022 0958   BILIRUBINUR NEGATIVE 04/03/2022 0958   KETONESUR 80 (A) 04/03/2022 0958   PROTEINUR 30 (A) 04/03/2022 0958   UROBILINOGEN 0.2 07/08/2014 1203   NITRITE NEGATIVE 04/03/2022 0958   LEUKOCYTESUR NEGATIVE 04/03/2022 0958   Sepsis Labs: @LABRCNTIP (procalcitonin:4,lacticidven:4)  ) Recent Results (from the past 240 hour(s))  Resp panel by RT-PCR (RSV, Flu A&B, Covid) Anterior Nasal Swab     Status: None   Collection Time: 04/03/22  3:26 AM   Specimen: Anterior Nasal Swab  Result Value Ref Range Status   SARS Coronavirus 2 by RT PCR NEGATIVE NEGATIVE Final    Comment: (NOTE) SARS-CoV-2 target nucleic acids are NOT DETECTED.  The SARS-CoV-2 RNA is generally detectable in upper respiratory specimens during the acute phase of infection. The lowest concentration of SARS-CoV-2 viral copies this assay can detect is 138 copies/mL. A negative result does not preclude SARS-Cov-2 infection and should not be used as the sole basis for treatment or other patient management decisions. A negative result may occur with  improper specimen collection/handling, submission of specimen other than nasopharyngeal swab, presence of viral mutation(s) within the areas targeted by this assay, and inadequate number of viral copies(<138 copies/mL). A negative result must be combined with clinical observations, patient history, and epidemiological information. The expected result is Negative.  Fact Sheet for Patients:  04/05/22  Fact Sheet for Healthcare Providers:  BloggerCourse.com  This test is no t yet approved or cleared by the [employer] FDA and  has been authorized for detection and/or diagnosis of SARS-CoV-2 by FDA under an Emergency Use Authorization (EUA). This EUA will remain  in effect (meaning this test can be used) for the duration of the COVID-19 declaration under Section 564(b)(1)  of the Act, 21 U.S.C.section 360bbb-3(b)(1), unless the authorization is terminated  or revoked sooner.       Influenza A by PCR NEGATIVE NEGATIVE Final   Influenza B by PCR NEGATIVE NEGATIVE Final    Comment: (NOTE) The Xpert Xpress SARS-CoV-2/FLU/RSV plus assay is intended as an aid in the diagnosis of influenza from Nasopharyngeal swab specimens and should not be used as a  sole basis for treatment. Nasal washings and aspirates are unacceptable for Xpert Xpress SARS-CoV-2/FLU/RSV testing.  Fact Sheet for Patients: EntrepreneurPulse.com.au  Fact Sheet for Healthcare Providers: IncredibleEmployment.be  This test is not yet approved or cleared by the [place] FDA and has been authorized for detection and/or diagnosis of SARS-CoV-2 by FDA under an Emergency Use Authorization (EUA). This EUA will remain in effect (meaning this test can be used) for the duration of the COVID-19 declaration under Section 564(b)(1) of the Act, 21 U.S.C. section 360bbb-3(b)(1), unless the authorization is terminated or revoked.     Resp Syncytial Virus by PCR NEGATIVE NEGATIVE Final    Comment: (NOTE) Fact Sheet for Patients: EntrepreneurPulse.com.au  Fact Sheet for Healthcare Providers: IncredibleEmployment.be  This test is not yet approved or cleared by the [place] FDA and has been authorized for detection and/or diagnosis of SARS-CoV-2 by FDA under an Emergency Use Authorization (EUA). This EUA will remain in effect (meaning this test can be used) for the duration of the COVID-19 declaration under Section 564(b)(1) of the Act, 21 U.S.C. section 360bbb-3(b)(1), unless the authorization is terminated or revoked.  Performed at [facility], [address], [place], [place] [address]   Culture, blood (Routine X 2) w Reflex to ID Panel     Status: None (Preliminary result)   Collection Time: 04/03/22  8:39 AM    Specimen: BLOOD LEFT HAND  Result Value Ref Range Status   Specimen Description BLOOD LEFT HAND  Final   Special Requests   Final    BOTTLES DRAWN AEROBIC AND ANAEROBIC Blood Culture adequate volume   Culture   Final    NO GROWTH 3 DAYS Performed at [facility], [address], [place], [place] [address]    Report Status PENDING  Incomplete  Culture, blood (Routine X 2) w Reflex to ID Panel     Status: None (Preliminary result)   Collection Time: 04/03/22  8:54 AM   Specimen: BLOOD RIGHT HAND  Result Value Ref Range Status   Specimen Description BLOOD RIGHT HAND  Final   Special Requests   Final    BOTTLES DRAWN AEROBIC AND ANAEROBIC Blood Culture adequate volume   Culture   Final    NO GROWTH 3 DAYS Performed at [facility], [address] [place], [place] [address]    Report Status PENDING  Incomplete  MRSA Next Gen by PCR, Nasal     Status: Abnormal   Collection Time: 04/03/22  4:15 PM   Specimen: Nasal Mucosa; Nasal Swab  Result Value Ref Range Status   MRSA by PCR Next Gen DETECTED (A) NOT DETECTED Final    Comment: RESULT CALLED TO, READ BACK BY AND VERIFIED WITH: BHATTREI,S ON 04/03/22 AT 2235 BY LOY,C (NOTE) The GeneXpert MRSA Assay (FDA approved for NASAL specimens only), is one component of a comprehensive MRSA colonization surveillance program. It is not intended to diagnose MRSA infection nor to guide or monitor treatment for MRSA infections. Test performance is not FDA approved in patients less than 36 years old. Performed at [facility], [address], [place], [place] [address]   Gastrointestinal Panel by PCR , Stool     Status: Abnormal   Collection Time: 04/04/22 12:01 AM   Specimen: Stool  Result Value Ref Range Status   Campylobacter species DETECTED (A) NOT DETECTED Final    Comment: RESULT CALLED TO, READ BACK BY AND VERIFIED WITH: MEGAN GRAVLEY AT 2026 ON 04/04/22 BY SS    Plesimonas shigelloides NOT  DETECTED NOT DETECTED Final   Salmonella species  NOT DETECTED NOT DETECTED Final   Yersinia enterocolitica NOT DETECTED NOT DETECTED Final   Vibrio species NOT DETECTED NOT DETECTED Final   Vibrio cholerae NOT DETECTED NOT DETECTED Final   Enteroaggregative E coli (EAEC) NOT DETECTED NOT DETECTED Final   Enteropathogenic E coli (EPEC) NOT DETECTED NOT DETECTED Final   Enterotoxigenic E coli (ETEC) NOT DETECTED NOT DETECTED Final   Shiga like toxin producing E coli (STEC) NOT DETECTED NOT DETECTED Final   Shigella/Enteroinvasive E coli (EIEC) NOT DETECTED NOT DETECTED Final   Cryptosporidium NOT DETECTED NOT DETECTED Final   Cyclospora cayetanensis NOT DETECTED NOT DETECTED Final   Entamoeba histolytica NOT DETECTED NOT DETECTED Final   Giardia lamblia NOT DETECTED NOT DETECTED Final   Adenovirus F40/41 NOT DETECTED NOT DETECTED Final   Astrovirus NOT DETECTED NOT DETECTED Final   Norovirus GI/GII NOT DETECTED NOT DETECTED Final   Rotavirus A NOT DETECTED NOT DETECTED Final   Sapovirus (I, II, IV, and V) NOT DETECTED NOT DETECTED Final    Comment: Performed at [facility], [address] [place], [place] [address]  C Difficile Quick Screen w PCR reflex     Status: None   Collection Time: 04/04/22 12:01 AM   Specimen: Stool  Result Value Ref Range Status   C Diff antigen NEGATIVE NEGATIVE Final   C Diff toxin NEGATIVE NEGATIVE Final   C Diff interpretation No C. difficile detected.  Final    Comment: Performed at [facility], [address], [place], [place] [address]      Studies: No results found.  Scheduled Meds:  aspirin EC  81 mg Oral Daily   atorvastatin  40 mg Oral Daily   azithromycin  500 mg Oral Daily   busPIRone  15 mg Oral TID   Chlorhexidine Gluconate Cloth  6 each Topical Q0600   chlorthalidone  25 mg Oral q morning   cyanocobalamin  1,000 mcg Oral Daily   DULoxetine  30 mg Oral BID   enoxaparin (LOVENOX) injection  40 mg Subcutaneous Q24H   finasteride  5 mg Oral Daily   furosemide  20 mg Oral  Daily   insulin aspart  0-9 Units Subcutaneous TID WC   insulin glargine-yfgn  30 Units Subcutaneous Q24H   lactulose  20 g Oral BID   montelukast  10 mg Oral QHS   mupirocin ointment  1 Application Nasal BID   omega-3 acid ethyl esters  2 capsule Oral Daily   pantoprazole (PROTONIX) IV  40 mg Intravenous Q12H   pregabalin  50 mg Oral BID   propranolol  10 mg Oral BID    Continuous Infusions:     LOS: 3 days     Alma Friendly, MD [facility]  If 7PM-7AM, please contact night-coverage [email or web address] 04/06/2022, 6:34 PM

## 2022-04-06 NOTE — Care Management Important Message (Signed)
Important Message  Patient Details  Name: Wesley Ho MRN: [id number] Date of Birth: 1955/04/16   Medicare Important Message Given:  Yes     Tommy Medal 04/06/2022, 11:02 AM

## 2022-04-07 DIAGNOSIS — E876 Hypokalemia: Secondary | ICD-10-CM

## 2022-04-07 DIAGNOSIS — E1110 Type 2 diabetes mellitus with ketoacidosis without coma: Secondary | ICD-10-CM | POA: Diagnosis not present

## 2022-04-07 DIAGNOSIS — E669 Obesity, unspecified: Secondary | ICD-10-CM | POA: Diagnosis not present

## 2022-04-07 DIAGNOSIS — N179 Acute kidney failure, unspecified: Secondary | ICD-10-CM | POA: Diagnosis not present

## 2022-04-07 DIAGNOSIS — R933 Abnormal findings on diagnostic imaging of other parts of digestive tract: Secondary | ICD-10-CM | POA: Diagnosis not present

## 2022-04-07 LAB — GLUCOSE, CAPILLARY
Glucose-Capillary: 179 mg/dL — ABNORMAL HIGH (ref 70–99)
Glucose-Capillary: 210 mg/dL — ABNORMAL HIGH (ref 70–99)

## 2022-04-07 LAB — MAGNESIUM: Magnesium: 2.1 mg/dL (ref 1.7–2.4)

## 2022-04-07 LAB — BASIC METABOLIC PANEL
Anion gap: 11 (ref 5–15)
BUN: 9 mg/dL (ref 8–23)
CO2: 31 mmol/L (ref 22–32)
Calcium: 8.6 mg/dL — ABNORMAL LOW (ref 8.9–10.3)
Chloride: 93 mmol/L — ABNORMAL LOW (ref 98–111)
Creatinine, Ser: 0.78 mg/dL (ref 0.61–1.24)
GFR, Estimated: >60 mL/min (ref >60)
Glucose, Bld: 162 mg/dL — ABNORMAL HIGH (ref 70–99)
Potassium: 3.3 mmol/L — ABNORMAL LOW (ref 3.5–5.1)
Sodium: 135 mmol/L (ref 135–145)

## 2022-04-07 MED ORDER — PANTOPRAZOLE SODIUM 40 MG PO TBEC: 40.0000 mg | DELAYED_RELEASE_TABLET | Freq: Two times a day (BID) | ORAL | 0 refills | Status: DC

## 2022-04-07 MED ORDER — LANTUS SOLOSTAR 100 UNIT/ML ~~LOC~~ SOPN: 30.0000 [IU] | PEN_INJECTOR | Freq: Every day | SUBCUTANEOUS | 0 refills | Status: DC

## 2022-04-07 MED ORDER — LACTULOSE 10 GM/15ML PO SOLN: 20.0000 g | Freq: Two times a day (BID) | ORAL | 0 refills | Status: DC

## 2022-04-07 MED ORDER — INSULIN PEN NEEDLE 32G X 4 MM MISC: 0 refills | Status: DC

## 2022-04-07 MED ORDER — POTASSIUM CHLORIDE CRYS ER 20 MEQ PO TBCR: 20.0000 meq | EXTENDED_RELEASE_TABLET | Freq: Every day | ORAL | 0 refills | Status: AC

## 2022-04-07 MED ORDER — FREESTYLE LIBRE 3 SENSOR MISC: 0 refills | Status: DC

## 2022-04-07 MED ORDER — POTASSIUM CHLORIDE CRYS ER 20 MEQ PO TBCR
60.0000 meq | EXTENDED_RELEASE_TABLET | Freq: Once | ORAL | Status: AC
  Administered 2022-04-07: 60 meq via ORAL
  Filled 2022-04-07: qty 3

## 2022-04-07 MED ORDER — BLOOD GLUCOSE TEST VI STRP: 1.0000 | ORAL_STRIP | Freq: Three times a day (TID) | 0 refills | Status: AC

## 2022-04-07 MED ORDER — LANCETS MISC. MISC: 1.0000 | Freq: Three times a day (TID) | 0 refills | Status: AC

## 2022-04-07 MED ORDER — BLOOD GLUCOSE MONITORING SUPPL DEVI: 1.0000 | Freq: Three times a day (TID) | 0 refills | Status: AC

## 2022-04-07 MED ORDER — LANCET DEVICE MISC: 1.0000 | Freq: Three times a day (TID) | 0 refills | Status: AC

## 2022-04-07 NOTE — Discharge Summary (Signed)
Physician Discharge Summary   Patient: Wesley Ho MRN: [id number] DOB: 06-Jun-1955  Admit date:     04/03/2022  Discharge date: 04/07/22  Discharge Physician: Alma Friendly   PCP: Celene Squibb, MD    Recommendations at discharge:   Follow-up with PCP within 3 days-discussed with patient to take his freestyle libre CGM to his doctors appointment for assistance on how to set it up, as we could not get any free samples here in the hospital.  Diabetes coordinator also sent video on how to apply the sensor, but unsure if patient is able to access it. Advised patient to check her sugars using a glucometer until test CGM has been set up for him (blood glucose meter and kit was ordered for patient).  Of note, patient has never checked her sugars as he was very reluctant to stick himself in the past.  Patient currently willing to try. Home health services were recommended for patient, but patient refused as he was told ??he would have to pay for them.  Advised patient to follow-up with PCP to attempt to establish home health services especially with a home health RN Ambulatory nutrition and diabetes education referral was done as urgent to assist patient with insulin use, and further management Follow-up with Endocrinology in 2 weeks Follow-up with GI in 2 to 3 weeks for management of cirrhosis and abnormal esophagus on CT   Discharge Diagnoses: Principal Problem:   DKA (diabetic ketoacidosis) ([place]) Active Problems:   Hyponatremia   ETOH abuse   COPD (chronic obstructive pulmonary disease) ([facility])   AKI (acute kidney injury) ([place])   Lactic acidosis   Class 1 obesity   DKA, type 2 ([facility])   Abnormal CT scan, esophagus   Alcoholic cirrhosis of liver without ascites ([place])   Nausea and vomiting    Hospital Course: Wesley Ho is a 67 year old male with a history of DM type 2, COPD, DVT 09/2010, anxiety, poor compliance, alcohol abuse in remission, tobacco abuse in remission presenting  with nausea, vomiting, and generalized weakness that began on 03/30/2022.  The patient states that he had some intermittent abdominal pain and loose stools.  The patient has a history of chronic loose stools. He states that he quit drinking alcohol 3 years ago and quit smoking 10 years ago. Review of his medical record shows a history of poor compliance. In the ED, the patient was afebrile and hemodynamically stable with oxygen saturation 95-90% on room air.  WBC 26.2, hemoglobin 10.6, platelets 277,000.  Sodium 132, potassium 2.7, bicarbonate 10, serum creatinine 2.07 with anion gap 38.  EKG shows sinus rhythm with nonspecific ST changes.  VBG 7.1 7/40/35/14. CT of the abdomen and pelvis showed circumferential thickening of the lower esophagus with mild periesophageal stranding.  There is a small lobulated liver with fatty infiltration.  He is status postcholecystectomy without ductal dilatation.  Chest x-ray showed hyperinflation.  The patient was started on IV fluids and insulin drip.  GI was consulted to assist with management of his esophageal thickening.  Patient admitted for further management.     Today, patient denies any new complaints, more alert, more conversant. Had an extensive conversation with patient about entire discharge plans, patient verbalized understanding.  Patient feeling more confident on using his insulin pen.  Discussed the need to check his sugars for a few days until his continuous glucose monitoring freestyle libre set up.  Discussed about medication, diet and appointment compliance.  Called son to arrange pickup  for patient.  Son is not involved in his father's care.  Patient relies on his ex-wife for assistance.   Assessment and Plan:  DKA Diabetes mellitus type 2, uncontrolled A1c 13.1 Patient is status post insulin drip Patient with history of noncompliance Discharged patient on metformin, glipizide, ??Unsure if he is taking Jardiance, 30 units of insulin Lantus  daily Discharged patient on glucometer, freestyle libre 3 CGM Follow-up with PCP, endocrinology, and ambulatory referral to nutrition and diabetes education as mentioned above   Acute kidney injury Resolved, likely 2/2 above S/p IV fluids   Campylobacter gastroenteritis Diarrhea improved, although on lactulose Stool analysis positive for Campylobacter Completed azithromycin x 3 days   Hypokalemia Replaced as needed Likely 2/2 chlorthalidone and Lasix Discharged on potassium supplements for another 5 days Follow-up with PCP   Leukocytosis Resolved Possibly due to gastroenteritis, afebrile UA unremarkable, chest x-ray with no acute findings, respiratory panel negative, C. difficile negative BC x 2 NGTD Procalcitonin 0.19   Lactic acidosis Resolved Multifactorial including acute kidney injury, volume depletion, gastroenteritis Workup as above   History of liver cirrhosis Possibly due to alcohol versus NASH Noted transaminasemia/hyperbilirubinemia Viral hepatitis serologies negative CT noted cirrhosis of liver GI on board, follow-up as an outpatient for further workup   Esophageal wall thickening Noted on CT abdomen and pelvis Possibly reactive due to vomiting, reports some intermittent epigastric burning Continue PPI GI consulted, outpatient follow-up with GI for further workup including EGD   COPD Stable on room air   Mixed Hyperlipidemia Resume statins once able   Anxiety Patient on PTA Xanax 1 mg, pregabalin 50 mg   Obesity Lifestyle modification advised     Pain control -  Controlled Substance Reporting System database was reviewed. and patient was instructed, not to drive, operate heavy machinery, perform activities at heights, swimming or participation in water activities or provide baby-sitting services while on Pain, Sleep and Anxiety Medications; until their outpatient Physician has advised to do so again. Also recommended to not to take  more than prescribed Pain, Sleep and Anxiety Medications.     Consultants: GI Procedures performed: None Disposition: Home Diet recommendation:  Cardiac and Carb modified diet   DISCHARGE MEDICATION: Allergies as of 04/07/2022   No Known Allergies      Medication List     STOP taking these medications    doxycycline 100 MG capsule Commonly known as: VIBRAMYCIN   MM BIOTIN/KERATIN PO       TAKE these medications    acetaminophen 500 MG tablet Commonly known as: TYLENOL Take 500-1,000 mg by mouth every 6 (six) hours as needed for mild pain.   ALPRAZolam 1 MG tablet Commonly known as: XANAX Take 1 mg by mouth every 6 (six) hours as needed.   aspirin EC 81 MG tablet Take 81 mg by mouth daily.   atorvastatin 40 MG tablet Commonly known as: LIPITOR Take 40 mg by mouth daily.   Blood Glucose Monitoring Suppl Devi 1 each by Does not apply route in the morning, at noon, and at bedtime. May substitute to any manufacturer covered by patient's insurance.   BLOOD GLUCOSE TEST STRIPS Strp 1 each by In Vitro route in the morning, at noon, and at bedtime. May substitute to any manufacturer covered by patient's insurance.   busPIRone 15 MG tablet Commonly known as: BUSPAR Take 1 tablet (15 mg total) by mouth 3 (three) times daily.   chlorthalidone 25 MG tablet Commonly known as: HYGROTON Take 1 tablet by mouth  every morning.   Cyanocobalamin 2500 MCG Tabs Take 1 tablet by mouth daily.   DULoxetine 30 MG capsule Commonly known as: CYMBALTA Take 30 mg by mouth 2 (two) times daily.   finasteride 5 MG tablet Commonly known as: PROSCAR Take 5 mg by mouth daily.   FreeStyle Libre 3 Sensor Misc Place 1 sensor on the skin every 14 days. Use to check glucose continuously   furosemide 20 MG tablet Commonly known as: LASIX Take 1 tablet by mouth daily.   glipiZIDE 5 MG tablet Commonly known as: GLUCOTROL Take 5 mg by mouth 2 (two) times daily.   Insulin Pen Needle  32G X 4 MM Misc Insulin pen needle   Jardiance 25 MG Tabs tablet Generic drug: empagliflozin Take 1 tablet by mouth daily.   lactulose 10 GM/15ML solution Commonly known as: CHRONULAC Take 30 mLs (20 g total) by mouth 2 (two) times daily.   Lancet Device Misc 1 each by Does not apply route in the morning, at noon, and at bedtime. May substitute to any manufacturer covered by patient's insurance.   Lancets Misc. Misc 1 each by Does not apply route in the morning, at noon, and at bedtime. May substitute to any manufacturer covered by patient's insurance.   Lantus SoloStar 100 UNIT/ML Solostar Pen Generic drug: insulin glargine Inject 30 Units into the skin daily. Start taking on: April 08, 2022   levocetirizine 5 MG tablet Commonly known as: XYZAL Take 1 tablet by mouth every morning.   lidocaine-prilocaine cream Commonly known as: EMLA Apply to feet for neuropathic pain   metFORMIN 500 MG tablet Commonly known as: GLUCOPHAGE Take 500 mg by mouth daily.   montelukast 10 MG tablet Commonly known as: SINGULAIR Take 10 mg by mouth daily.   omega-3 acid ethyl esters 1 g capsule Commonly known as: LOVAZA Take 2 capsules by mouth daily.   pantoprazole 40 MG tablet Commonly known as: Protonix Take 1 tablet (40 mg total) by mouth 2 (two) times daily before a meal.   potassium chloride SA 20 MEQ tablet Commonly known as: KLOR-CON M Take 1 tablet (20 mEq total) by mouth daily for 5 days.   pregabalin 200 MG capsule Commonly known as: LYRICA Take 200 mg by mouth 3 (three) times daily.   propranolol 60 MG tablet Commonly known as: INDERAL Take 60 mg by mouth 2 (two) times daily.   Refresh Tears 0.5 % Soln Generic drug: carboxymethylcellulose 1 drop 2 (two) times daily as needed.        Follow-up Information     Celene Squibb, MD. Schedule an appointment as soon as possible for a visit in 3 day(s).   Specialty: Internal Medicine Contact information: [address] [facility] [address] [phone]         Cassandria Anger, MD. Schedule an appointment as soon as possible for a visit in 1 week(s).   Specialty: Endocrinology Contact information: [place] [address] [phone]                Discharge Exam: Danley Danker Weights   04/03/22 0536 04/03/22 1600  Weight: 90.7 kg 98.1 kg   General: NAD  Cardiovascular: S1, S2 present Respiratory: CTAB Abdomen: Soft, nontender, nondistended, bowel sounds present Musculoskeletal: No bilateral pedal edema noted Skin: Normal Psychiatry: Normal mood   Condition at discharge: stable    The results of significant diagnostics from this hospitalization (including imaging, microbiology, ancillary and laboratory) are listed below  for reference.   Imaging Studies: US Abdomen Limited RUQ (LIVER/GB)  Result Date: 04/03/2022 CLINICAL DATA:  Elevated liver function studies. EXAM: ULTRASOUND ABDOMEN LIMITED RIGHT UPPER QUADRANT COMPARISON:  CT scan 04/03/2022 FINDINGS: Gallbladder: Surgically absent. Common bile duct: Diameter: 4.6 mm Liver: Cirrhotic changes involving the liver with a very irregular liver contour. Increased coarse echogenicity with poor through transmission and poor definition of the liver architecture likely a combination of cirrhosis and fatty infiltration. No hepatic lesions are identified. No intrahepatic biliary dilatation. Portal vein is patent on color Doppler imaging with normal direction of blood flow towards the liver. Other: None. IMPRESSION: 1. Status post cholecystectomy. No biliary dilatation. 2. Cirrhotic changes involving the liver but no obvious hepatic lesions. Electronically Signed   By: Rudie Meyer M.D.   On: 04/03/2022 09:30   DG Chest Port 1 View  Result Date: 04/03/2022 CLINICAL DATA:  Chest pain EXAM: PORTABLE CHEST 1 VIEW COMPARISON:  09/02/2019 FINDINGS: Chronic interstitial coarsening from emphysema by 2018 chest CT. There is no  edema, consolidation, effusion, or pneumothorax. Normal heart size and stable mediastinal contours. Artifact from EKG leads. IMPRESSION: No evidence of acute disease. Emphysema Electronically Signed   By: Tiburcio Pea M.D.   On: 04/03/2022 04:25   CT ABDOMEN PELVIS WO CONTRAST  Result Date: 04/03/2022 CLINICAL DATA:  Acute, nonlocalized abdominal pain EXAM: CT ABDOMEN AND PELVIS WITHOUT CONTRAST TECHNIQUE: Multidetector CT imaging of the abdomen and pelvis was performed following the standard protocol without IV contrast. RADIATION DOSE REDUCTION: This exam was performed according to the departmental dose-optimization program which includes automated exposure control, adjustment of the mA and/or kV according to patient size and/or use of iterative reconstruction technique. COMPARISON:  11/20/2016 FINDINGS: Lower chest: Motion artifact. Coronary atherosclerosis. Circumferential thickened appearance of the lower esophagus with possible mild periesophageal fat stranding. Hepatobiliary: Small and lobulated liver with fatty infiltration.Cholecystectomy without bile duct dilatation Pancreas: Unremarkable. Spleen: Unremarkable. Adrenals/Urinary Tract: Negative adrenals. No hydronephrosis or stone. Unremarkable bladder. Stomach/Bowel:  No obstruction. No appendicitis. Vascular/Lymphatic: No acute vascular abnormality. Extensive atheromatous calcification. No mass or adenopathy. Reproductive:No pathologic findings. Other: No ascites or pneumoperitoneum. Fatty enlargement of the left more than right inguinal canal. Musculoskeletal: No acute abnormalities. Ordinary lumbar spine degeneration IMPRESSION: 1. No acute intra-abdominal finding. 2. Circumferential thickened appearance of the lower esophagus, correlate for esophagitis symptoms. 3. Cirrhosis and extensive atherosclerosis. Electronically Signed   By: Tiburcio Pea M.D.   On: 04/03/2022 04:19    Microbiology: Results for orders placed or performed during the  hospital encounter of 04/03/22  Resp panel by RT-PCR (RSV, Flu A&B, Covid) Anterior Nasal Swab     Status: None   Collection Time: 04/03/22  3:26 AM   Specimen: Anterior Nasal Swab  Result Value Ref Range Status   SARS Coronavirus 2 by RT PCR NEGATIVE NEGATIVE Final    Comment: (NOTE) SARS-CoV-2 target nucleic acids are NOT DETECTED.  The SARS-CoV-2 RNA is generally detectable in upper respiratory specimens during the acute phase of infection. The lowest concentration of SARS-CoV-2 viral copies this assay can detect is 138 copies/mL. A negative result does not preclude SARS-Cov-2 infection and should not be used as the sole basis for treatment or other patient management decisions. A negative result may occur with  improper specimen collection/handling, submission of specimen other than nasopharyngeal swab, presence of viral mutation(s) within the areas targeted by this assay, and inadequate number of viral copies(<138 copies/mL). A negative result must be combined with clinical observations, patient history,  and epidemiological information. The expected result is Negative.  Fact Sheet for Patients:  EntrepreneurPulse.com.au  Fact Sheet for Healthcare Providers:  IncredibleEmployment.be  This test is no t yet approved or cleared by the [place] FDA and  has been authorized for detection and/or diagnosis of SARS-CoV-2 by FDA under an Emergency Use Authorization (EUA). This EUA will remain  in effect (meaning this test can be used) for the duration of the COVID-19 declaration under Section 564(b)(1) of the Act, 21 U.S.C.section 360bbb-3(b)(1), unless the authorization is terminated  or revoked sooner.       Influenza A by PCR NEGATIVE NEGATIVE Final   Influenza B by PCR NEGATIVE NEGATIVE Final    Comment: (NOTE) The Xpert Xpress SARS-CoV-2/FLU/RSV plus assay is intended as an aid in the diagnosis of influenza from Nasopharyngeal swab  specimens and should not be used as a sole basis for treatment. Nasal washings and aspirates are unacceptable for Xpert Xpress SARS-CoV-2/FLU/RSV testing.  Fact Sheet for Patients: EntrepreneurPulse.com.au  Fact Sheet for Healthcare Providers: IncredibleEmployment.be  This test is not yet approved or cleared by the [place] FDA and has been authorized for detection and/or diagnosis of SARS-CoV-2 by FDA under an Emergency Use Authorization (EUA). This EUA will remain in effect (meaning this test can be used) for the duration of the COVID-19 declaration under Section 564(b)(1) of the Act, 21 U.S.C. section 360bbb-3(b)(1), unless the authorization is terminated or revoked.     Resp Syncytial Virus by PCR NEGATIVE NEGATIVE Final    Comment: (NOTE) Fact Sheet for Patients: EntrepreneurPulse.com.au  Fact Sheet for Healthcare Providers: IncredibleEmployment.be  This test is not yet approved or cleared by the [place] FDA and has been authorized for detection and/or diagnosis of SARS-CoV-2 by FDA under an Emergency Use Authorization (EUA). This EUA will remain in effect (meaning this test can be used) for the duration of the COVID-19 declaration under Section 564(b)(1) of the Act, 21 U.S.C. section 360bbb-3(b)(1), unless the authorization is terminated or revoked.  Performed at [facility], [address] [place], [place] [address]   Culture, blood (Routine X 2) w Reflex to ID Panel     Status: None (Preliminary result)   Collection Time: 04/03/22  8:39 AM   Specimen: BLOOD LEFT HAND  Result Value Ref Range Status   Specimen Description BLOOD LEFT HAND  Final   Special Requests   Final    BOTTLES DRAWN AEROBIC AND ANAEROBIC Blood Culture adequate volume   Culture   Final    NO GROWTH 4 DAYS Performed at [facility], [address] [place], [place] [address]    Report Status PENDING  Incomplete   Culture, blood (Routine X 2) w Reflex to ID Panel     Status: None (Preliminary result)   Collection Time: 04/03/22  8:54 AM   Specimen: BLOOD RIGHT HAND  Result Value Ref Range Status   Specimen Description BLOOD RIGHT HAND  Final   Special Requests   Final    BOTTLES DRAWN AEROBIC AND ANAEROBIC Blood Culture adequate volume   Culture   Final    NO GROWTH 4 DAYS Performed at [facility], [address] [place], [place] [address]    Report Status PENDING  Incomplete  MRSA Next Gen by PCR, Nasal     Status: Abnormal   Collection Time: 04/03/22  4:15 PM   Specimen: Nasal Mucosa; Nasal Swab  Result Value Ref Range Status   MRSA by PCR Next Gen DETECTED (A) NOT DETECTED  Final    Comment: RESULT CALLED TO, READ BACK BY AND VERIFIED WITH: BHATTREI,S ON 04/03/22 AT 2235 BY LOY,C (NOTE) The GeneXpert MRSA Assay (FDA approved for NASAL specimens only), is one component of a comprehensive MRSA colonization surveillance program. It is not intended to diagnose MRSA infection nor to guide or monitor treatment for MRSA infections. Test performance is not FDA approved in patients less than 42 years old. Performed at [facility], [address], [place], [place] [address]   Gastrointestinal Panel by PCR , Stool     Status: Abnormal   Collection Time: 04/04/22 12:01 AM   Specimen: Stool  Result Value Ref Range Status   Campylobacter species DETECTED (A) NOT DETECTED Final    Comment: RESULT CALLED TO, READ BACK BY AND VERIFIED WITH: MEGAN GRAVLEY AT 2026 ON 04/04/22 BY SS    Plesimonas shigelloides NOT DETECTED NOT DETECTED Final   Salmonella species NOT DETECTED NOT DETECTED Final   Yersinia enterocolitica NOT DETECTED NOT DETECTED Final   Vibrio species NOT DETECTED NOT DETECTED Final   Vibrio cholerae NOT DETECTED NOT DETECTED Final   Enteroaggregative E coli (EAEC) NOT DETECTED NOT DETECTED Final   Enteropathogenic E coli (EPEC) NOT DETECTED NOT DETECTED Final   Enterotoxigenic E coli  (ETEC) NOT DETECTED NOT DETECTED Final   Shiga like toxin producing E coli (STEC) NOT DETECTED NOT DETECTED Final   Shigella/Enteroinvasive E coli (EIEC) NOT DETECTED NOT DETECTED Final   Cryptosporidium NOT DETECTED NOT DETECTED Final   Cyclospora cayetanensis NOT DETECTED NOT DETECTED Final   Entamoeba histolytica NOT DETECTED NOT DETECTED Final   Giardia lamblia NOT DETECTED NOT DETECTED Final   Adenovirus F40/41 NOT DETECTED NOT DETECTED Final   Astrovirus NOT DETECTED NOT DETECTED Final   Norovirus GI/GII NOT DETECTED NOT DETECTED Final   Rotavirus A NOT DETECTED NOT DETECTED Final   Sapovirus (I, II, IV, and V) NOT DETECTED NOT DETECTED Final    Comment: Performed at [facility], [address] [place], [place] [address]  C Difficile Quick Screen w PCR reflex     Status: None   Collection Time: 04/04/22 12:01 AM   Specimen: Stool  Result Value Ref Range Status   C Diff antigen NEGATIVE NEGATIVE Final   C Diff toxin NEGATIVE NEGATIVE Final   C Diff interpretation No C. difficile detected.  Final    Comment: Performed at [facility], [address], [place], [place] [address]    Labs: CBC: Recent Labs  Lab 04/03/22 [phone] 04/04/22 0415 04/05/22 0415 04/06/22 0417  WBC 26.2* 20.1* 12.1* 9.4  NEUTROABS  --   --  8.1* 5.5  HGB 18.6* 13.7 12.3* 12.9*  HCT 56.3* 40.9 36.8* 38.4*  MCV 101.8* 100.2* 98.7 99.2  PLT 277 171 138* 143*   Basic Metabolic Panel: Recent Labs  Lab 04/03/22 0839 04/03/22 1155 04/04/22 0802 04/04/22 0804 04/04/22 1214 04/05/22 0415 04/06/22 0417 04/06/22 1723 04/07/22 0553  NA 132*   < >  --    < > 135 133* 132* 136 135  K 3.8   < >  --    < > 3.1* 2.9* 2.5* 3.1* 3.3*  CL 91*   < >  --    < > 103 98 96* 92* 93*  CO2 11*   < >  --    < > 22 24 30 30 31   GLUCOSE 322*   < >  --    < > 163* 161* 145* 143* 162*  BUN 32*   < >  --    < > 12 10 10 9 9   CREATININE 1.66*   < >  --    < > 0.81 0.84 0.73 0.80 0.78  CALCIUM 8.4*   < >  --     < > 8.2* 8.0* 8.0* 8.8* 8.6*  MG 2.7*  --  1.9  --   --  2.1  --   --  2.1   < > = values in this interval not displayed.   Liver Function Tests: Recent Labs  Lab 04/03/22 0323 04/03/22 0839 04/04/22 0415 04/04/22 1214  AST 80*  --  27 35  ALT <5  --  24 25  ALKPHOS 120  --  61 61  BILITOT 2.4* 2.4* 1.2 1.2  PROT 9.7*  --  6.4* 6.6  ALBUMIN 4.1  --  2.8* 2.9*   CBG: Recent Labs  Lab 04/06/22 1109 04/06/22 1606 04/06/22 2228 04/07/22 0835 04/07/22 1135  GLUCAP 166* 158* 167* 179* 210*    Discharge time spent: greater than 30 minutes.  Signed: 06/06/22, MD [facility] 04/07/2022

## 2022-04-07 NOTE — Inpatient Diabetes Management (Signed)
Inpatient Diabetes Program Recommendations  AACE/ADA: New Consensus Statement on Inpatient Glycemic Control (2015)  Target Ranges:  Prepandial:   less than 140 mg/dL      Peak postprandial:   less than 180 mg/dL (1-2 hours)      Critically ill patients:  140 - 180 mg/dL   Lab Results  Component Value Date   GLUCAP 210 (H) 04/07/2022   HGBA1C 13.1 (H) 04/03/2022    Review of Glycemic Control  Received page from RN and was instructed to call Pharmacy. Called Cecille Rubin to discuss pt's discharge orders and was asked to call Dr Horris Latino. Attempted to call and could not reach, therefore sent secure text to MD with this Coordinator's phone number. Pt is discharging home today on Lantus 30 units QD, Jardiance 25 QD, metformin 500 mg QD, and glipizide 5 mg BID and Freestyle Libre 3 CGM. Previous Diabetes Coordinator recommended [employer] as pt stated he would likely not be consistent in sticking finger for glucose checks. Video was sent on how to apply sensor.   Would also recommend blood glucose meter kit (#[id number]), as pt will need to do fingersticks as well. MD has ordered same.   Attempted to call pt and no answer on cell phone. Could not leave message as mailbox was full. OP Diabetes Education has been ordered.  Thank you. Lorenda Peck, RD, LDN, [place] Inpatient Diabetes Coordinator [phone]

## 2022-04-07 NOTE — Plan of Care (Signed)
  Problem: Coping: Goal: Ability to adjust to condition or change in health will improve 04/07/2022 0034 by Charlaine Dalton, RN Outcome: Progressing 04/07/2022 0034 by Charlaine Dalton, RN Outcome: Progressing   Problem: Health Behavior/Discharge Planning: Goal: Ability to manage health-related needs will improve 04/07/2022 0034 by Charlaine Dalton, RN Outcome: Progressing 04/07/2022 0034 by Charlaine Dalton, RN Outcome: Progressing   Problem: Metabolic: Goal: Ability to maintain appropriate glucose levels will improve 04/07/2022 0034 by Charlaine Dalton, RN Outcome: Progressing 04/07/2022 0034 by Charlaine Dalton, RN Outcome: Progressing   Problem: Nutritional: Goal: Maintenance of adequate nutrition will improve 04/07/2022 0034 by Charlaine Dalton, RN Outcome: Progressing 04/07/2022 0034 by Charlaine Dalton, RN Outcome: Progressing   Problem: Education: Goal: Ability to describe self-care measures that may prevent or decrease complications (Diabetes Survival Skills Education) will improve 04/07/2022 0034 by Charlaine Dalton, RN Outcome: Progressing 04/07/2022 0034 by Charlaine Dalton, RN Outcome: Progressing   Problem: Health Behavior/Discharge Planning: Goal: Ability to identify and utilize available resources and services will improve 04/07/2022 0034 by Charlaine Dalton, RN Outcome: Progressing 04/07/2022 0034 by Charlaine Dalton, RN Outcome: Progressing   Problem: Education: Goal: Knowledge of General Education information will improve Description: Including pain rating scale, medication(s)/side effects and non-pharmacologic comfort measures 04/07/2022 0034 by Charlaine Dalton, RN Outcome: Progressing 04/07/2022 0034 by Charlaine Dalton, RN Outcome: Progressing   Problem: Activity: Goal: Risk for activity intolerance will decrease 04/07/2022 0034 by Charlaine Dalton, RN Outcome: Progressing 04/07/2022 0034 by Charlaine Dalton, RN Outcome: Progressing   Problem: Nutrition: Goal: Adequate nutrition  will be maintained 04/07/2022 0034 by Charlaine Dalton, RN Outcome: Progressing 04/07/2022 0034 by Charlaine Dalton, RN Outcome: Progressing

## 2022-04-08 LAB — CULTURE, BLOOD (ROUTINE X 2)
Culture: NO GROWTH
Culture: NO GROWTH
Special Requests: ADEQUATE
Special Requests: ADEQUATE

## 2022-04-11 ENCOUNTER — Telehealth: Payer: Self-pay | Admitting: Nutrition

## 2022-04-11 NOTE — Telephone Encounter (Signed)
Called on both phones and left message to call and schedule appt.

## 2022-04-12 ENCOUNTER — Encounter: Payer: Self-pay | Admitting: "Endocrinology

## 2022-04-12 ENCOUNTER — Ambulatory Visit: Payer: PPO | Admitting: "Endocrinology

## 2022-04-12 VITALS — BP 98/60 | HR 76 | Ht 68.0 in | Wt 209.0 lb

## 2022-04-12 DIAGNOSIS — E1165 Type 2 diabetes mellitus with hyperglycemia: Secondary | ICD-10-CM

## 2022-04-12 DIAGNOSIS — Z7984 Long term (current) use of oral hypoglycemic drugs: Secondary | ICD-10-CM | POA: Diagnosis not present

## 2022-04-12 DIAGNOSIS — K7460 Unspecified cirrhosis of liver: Secondary | ICD-10-CM | POA: Diagnosis not present

## 2022-04-12 DIAGNOSIS — Z794 Long term (current) use of insulin: Secondary | ICD-10-CM | POA: Diagnosis not present

## 2022-04-12 DIAGNOSIS — E1310 Other specified diabetes mellitus with ketoacidosis without coma: Secondary | ICD-10-CM | POA: Diagnosis not present

## 2022-04-12 DIAGNOSIS — I10 Essential (primary) hypertension: Secondary | ICD-10-CM | POA: Diagnosis not present

## 2022-04-12 DIAGNOSIS — E782 Mixed hyperlipidemia: Secondary | ICD-10-CM

## 2022-04-12 DIAGNOSIS — K7030 Alcoholic cirrhosis of liver without ascites: Secondary | ICD-10-CM | POA: Diagnosis not present

## 2022-04-12 DIAGNOSIS — Z87891 Personal history of nicotine dependence: Secondary | ICD-10-CM | POA: Diagnosis not present

## 2022-04-12 DIAGNOSIS — E1100 Type 2 diabetes mellitus with hyperosmolarity without nonketotic hyperglycemic-hyperosmolar coma (NKHHC): Secondary | ICD-10-CM | POA: Diagnosis not present

## 2022-04-12 DIAGNOSIS — E1110 Type 2 diabetes mellitus with ketoacidosis without coma: Secondary | ICD-10-CM | POA: Diagnosis not present

## 2022-04-12 DIAGNOSIS — K222 Esophageal obstruction: Secondary | ICD-10-CM | POA: Diagnosis not present

## 2022-04-12 MED ORDER — LANTUS SOLOSTAR 100 UNIT/ML ~~LOC~~ SOPN: 30.0000 [IU] | PEN_INJECTOR | Freq: Every day | SUBCUTANEOUS | 2 refills | Status: DC

## 2022-04-12 NOTE — Patient Instructions (Signed)
                                     Advice for Weight Management  -For most of us the best way to lose weight is by diet management. Generally speaking, diet management means consuming less calories intentionally which over time brings about progressive weight loss.  This can be achieved more effectively by avoiding ultra processed carbohydrates, processed meats, unhealthy fats.    It is critically important to know your numbers: how much calorie you are consuming and how much calorie you need. More importantly, our carbohydrates sources should be unprocessed naturally occurring  complex starch food items.  It is always important to balance nutrition also by  appropriate intake of proteins (mainly plant-based), healthy fats/oils, plenty of fruits and vegetables.   -The American College of Lifestyle Medicine (ACL M) recommends nutrition derived mostly from Whole Food, Plant Predominant Sources example an apple instead of applesauce or apple pie. Eat Plenty of vegetables, Mushrooms, fruits, Legumes, Whole Grains, Nuts, seeds in lieu of processed meats, processed snacks/pastries red meat, poultry, eggs.  Use only water or unsweetened tea for hydration.  The College also recommends the need to stay away from risky substances including alcohol, smoking; obtaining 7-9 hours of restorative sleep, at least 150 minutes of moderate intensity exercise weekly, importance of healthy social connections, and being mindful of stress and seek help when it is overwhelming.    -Sticking to a routine mealtime to eat 3 meals a day and avoiding unnecessary snacks is shown to have a big role in weight control. Under normal circumstances, the only time we burn stored energy is when we are hungry, so allow  some hunger to take place- hunger means no food between appropriate meal times, only water.  It is not advisable to starve.   -It is better to avoid simple carbohydrates including:  Cakes, Sweet Desserts, Ice Cream, Soda (diet and regular), Sweet Tea, Candies, Chips, Cookies, Store Bought Juices, Alcohol in Excess of  1-2 drinks a day, Lemonade,  Artificial Sweeteners, Doughnuts, Coffee Creamers, "Sugar-free" Products, etc, etc.  This is not a complete list.....    -Consulting with certified diabetes educators is proven to provide you with the most accurate and current information on diet.  Also, you may be  interested in discussing diet options/exchanges , we can schedule a visit with Wesley Ho, RDN, CDE for individualized nutrition education.  -Exercise: If you are able: 30 -60 minutes a day ,4 days a week, or 150 minutes of moderate intensity exercise weekly.    The longer the better if tolerated.  Combine stretch, strength, and aerobic activities.  If you were told in the past that you have high risk for cardiovascular diseases, or if you are currently symptomatic, you may seek evaluation by your heart doctor prior to initiating moderate to intense exercise programs.                                  Additional Care Considerations for Diabetes/Prediabetes   -Diabetes  is a chronic disease.  The most important care consideration is regular follow-up with your diabetes care provider with the goal being avoiding or delaying its complications and to take advantage of advances in medications and technology.  If appropriate actions are taken early enough, type 2 diabetes can even be   reversed.  Seek information from the right source.  - Whole Food, Plant Predominant Nutrition is highly recommended: Eat Plenty of vegetables, Mushrooms, fruits, Legumes, Whole Grains, Nuts, seeds in lieu of processed meats, processed snacks/pastries red meat, poultry, eggs as recommended by American College of  Lifestyle Medicine (ACLM).  -Type 2 diabetes is known to coexist with other important comorbidities such as high blood pressure and high cholesterol.  It is critical to control not only the  diabetes but also the high blood pressure and high cholesterol to minimize and delay the risk of complications including coronary artery disease, stroke, amputations, blindness, etc.  The good news is that this diet recommendation for type 2 diabetes is also very helpful for managing high cholesterol and high blood blood pressure.  - Studies showed that people with diabetes will benefit from a class of medications known as ACE inhibitors and statins.  Unless there are specific reasons not to be on these medications, the standard of care is to consider getting one from these groups of medications at an optimal doses.  These medications are generally considered safe and proven to help protect the heart and the kidneys.    - People with diabetes are encouraged to initiate and maintain regular follow-up with eye doctors, foot doctors, dentists , and if necessary heart and kidney doctors.     - It is highly recommended that people with diabetes quit smoking or stay away from smoking, and get yearly  flu vaccine and pneumonia vaccine at least every 5 years.  See above for additional recommendations on exercise, sleep, stress management , and healthy social connections.      

## 2022-04-12 NOTE — Progress Notes (Signed)
Endocrinology Consult Note       04/12/2022, 3:22 PM   Subjective:    Patient ID: Wesley Ho, male    DOB: Sep 29, 1955.  Wesley Ho is being seen in consultation for management of currently uncontrolled symptomatic diabetes requested by  Benita Stabile, MD.   Past Medical History:  Diagnosis Date   Anxiety    Arthritis    Bronchitis    Chronic diarrhea    Chronic edema of lower extremity 09/28/2010   COPD (chronic obstructive pulmonary disease) (HCC)    Diabetes mellitus without complication (HCC)    Diabetes mellitus, type II (HCC)    DVT (deep venous thrombosis) (HCC) 09/28/2010   Emphysema of lung (HCC)    Fatigue    H/O Thrombocytopenia 09/28/2010   Hypertension    Left radial fracture    Multiple lung nodules 09/28/2010   Neuropathy     Past Surgical History:  Procedure Laterality Date   CATARACT EXTRACTION W/PHACO Right 01/17/2016   Procedure: CATARACT EXTRACTION PHACO AND INTRAOCULAR LENS PLACEMENT (IOC);  Surgeon: Jethro Bolus, MD;  Location: [facility];  Service: Ophthalmology;  Laterality: Right;  CDE: 4.34   CATARACT EXTRACTION W/PHACO Left 01/31/2016   Procedure: CATARACT EXTRACTION PHACO AND INTRAOCULAR LENS PLACEMENT (IOC);  Surgeon: Jethro Bolus, MD;  Location: [facility];  Service: Ophthalmology;  Laterality: Left;  CDE: 5.69   CHOLECYSTECTOMY     MOUTH SURGERY      Social History   Socioeconomic History   Marital status: Divorced    Spouse name: Not on file   Number of children: 2   Years of education: Not on file   Highest education level: Not on file  Occupational History   Occupation: Retired  Tobacco Use   Smoking status: Former    Packs/day: 1.00    Years: 40.00    Total pack years: 40.00    Types: Cigarettes    Quit date: 01/11/2013    Years since quitting: 9.2   Smokeless tobacco: Never  Vaping Use   Vaping Use: Never used  Substance and Sexual Activity    Alcohol use: No    Comment: stopped drinking 2016.   Drug use: No   Sexual activity: Not Currently    Birth control/protection: None  Other Topics Concern   Not on file  Social History Narrative   Not on file   Social Determinants of Health   Financial Resource Strain: Not on file  Food Insecurity: No Food Insecurity (04/03/2022)   Hunger Vital Sign    Worried About Running Out of Food in the Last Year: Never true    Ran Out of Food in the Last Year: Never true  Transportation Needs: No Transportation Needs (04/03/2022)   PRAPARE - Administrator, Civil Service (Medical): No    Lack of Transportation (Non-Medical): No  Physical Activity: Not on file  Stress: Not on file  Social Connections: Not on file    Family History  Problem Relation Age of Onset   Diabetes Mother    Clotting disorder Father    Alcohol abuse Maternal Uncle  Lung cancer Maternal Grandfather    Emphysema Paternal Grandfather     Outpatient Encounter Medications as of 04/12/2022  Medication Sig   acetaminophen (TYLENOL) 500 MG tablet Take 500-1,000 mg by mouth every 6 (six) hours as needed for mild pain.   ALPRAZolam (XANAX) 1 MG tablet Take 1 mg by mouth every 6 (six) hours as needed.   atorvastatin (LIPITOR) 40 MG tablet Take 40 mg by mouth daily.   Blood Glucose Monitoring Suppl DEVI 1 each by Does not apply route in the morning, at noon, and at bedtime. May substitute to any manufacturer covered by patient's insurance.   busPIRone (BUSPAR) 15 MG tablet Take 1 tablet (15 mg total) by mouth 3 (three) times daily.   carboxymethylcellulose (REFRESH TEARS) 0.5 % SOLN 1 drop 2 (two) times daily as needed.    chlorthalidone (HYGROTON) 25 MG tablet Take 1 tablet by mouth every morning.   Continuous Blood Gluc Sensor (FREESTYLE LIBRE 3 SENSOR) MISC Place 1 sensor on the skin every 14 days. Use to check glucose continuously   Cyanocobalamin 2500 MCG TABS Take 1 tablet by mouth daily.  (Patient not  taking: Reported on 04/03/2022)   DULoxetine (CYMBALTA) 30 MG capsule Take 30 mg by mouth 2 (two) times daily.   finasteride (PROSCAR) 5 MG tablet Take 5 mg by mouth daily.   furosemide (LASIX) 20 MG tablet Take 1 tablet by mouth daily.   glipiZIDE (GLUCOTROL) 5 MG tablet Take 5 mg by mouth 2 (two) times daily.   Glucose Blood (BLOOD GLUCOSE TEST STRIPS) STRP 1 each by In Vitro route in the morning, at noon, and at bedtime. May substitute to any manufacturer covered by patient's insurance.   insulin glargine (LANTUS SOLOSTAR) 100 UNIT/ML Solostar Pen Inject 30 Units into the skin at bedtime.   Insulin Pen Needle 32G X 4 MM MISC Insulin pen needle   JARDIANCE 25 MG TABS tablet Take 1 tablet by mouth daily.   lactulose (CHRONULAC) 10 GM/15ML solution Take 30 mLs (20 g total) by mouth 2 (two) times daily.   Lancet Device MISC 1 each by Does not apply route in the morning, at noon, and at bedtime. May substitute to any manufacturer covered by patient's insurance.   Lancets Misc. MISC 1 each by Does not apply route in the morning, at noon, and at bedtime. May substitute to any manufacturer covered by patient's insurance.   levocetirizine (XYZAL) 5 MG tablet Take 1 tablet by mouth every morning.   lidocaine-prilocaine (EMLA) cream Apply to feet for neuropathic pain   metFORMIN (GLUCOPHAGE) 500 MG tablet Take 500 mg by mouth daily.   montelukast (SINGULAIR) 10 MG tablet Take 10 mg by mouth daily.   omega-3 acid ethyl esters (LOVAZA) 1 g capsule Take 2 capsules by mouth daily.   pantoprazole (PROTONIX) 40 MG tablet Take 1 tablet (40 mg total) by mouth 2 (two) times daily before a meal.   potassium chloride SA (KLOR-CON M) 20 MEQ tablet Take 1 tablet (20 mEq total) by mouth daily for 5 days.   pregabalin (LYRICA) 200 MG capsule Take 200 mg by mouth 3 (three) times daily.   propranolol (INDERAL) 60 MG tablet Take 60 mg by mouth 2 (two) times daily.   [DISCONTINUED] aspirin EC 81 MG tablet Take 81 mg by  mouth daily. (Patient not taking: Reported on 04/03/2022)   [DISCONTINUED] insulin glargine (LANTUS SOLOSTAR) 100 UNIT/ML Solostar Pen Inject 30 Units into the skin daily.   No facility-administered encounter medications  on file as of 04/12/2022.    ALLERGIES: No Known Allergies  VACCINATION STATUS: Immunization History  Administered Date(s) Administered   Fluad Quad(high Dose 65+) 12/21/2019   Influenza-Unspecified 11/03/2017   Tdap 09/02/2019    Diabetes He presents for his initial diabetic visit. He has type 2 diabetes mellitus. Onset time: Patient reports that he was not aware of having diabetes and did he went to the hospital with severe hyperglycemia leading to diabetes ketoacidosis recently.  He did have A1c of 6.1% consistent with prediabetes in 2016. There are no hypoglycemic associated symptoms. Pertinent negatives for hypoglycemia include no confusion, headaches, pallor or seizures. Associated symptoms include polydipsia and polyuria. Pertinent negatives for diabetes include no chest pain, no fatigue, no polyphagia and no weakness. There are no hypoglycemic complications. Symptoms are worsening. Risk factors for coronary artery disease include diabetes mellitus, dyslipidemia, hypertension, male sex, tobacco exposure, sedentary lifestyle, family history and obesity. Current diabetic treatments: Is currently on Lantus 30 units daily, mean 500 mg p.o. twice daily, glipizide 5 last p.o. daily at breakfast, and Jardiance 25 mg p.o. daily at breakfast. His weight is fluctuating minimally. He is following a generally unhealthy diet. When asked about meal planning, he reported none. He has not had a previous visit with a dietitian. He never participates in exercise. (He did not bring any logs nor meter to review.  His A1c was 13.1% during his recent hospitalization for diabetes ketoacidosis.) An ACE inhibitor/angiotensin II receptor blocker is being taken.  Hypertension This is a chronic problem.  The current episode started more than 1 year ago. The problem is controlled. Pertinent negatives include no chest pain, headaches, neck pain, palpitations or shortness of breath. Past treatments include beta blockers and diuretics. Compliance problems: He has a documented history of medical noncompliance.   Hyperlipidemia This is a chronic problem. Exacerbating diseases include diabetes and obesity. Pertinent negatives include no chest pain, myalgias or shortness of breath. Current antihyperlipidemic treatment includes statins. Risk factors for coronary artery disease include dyslipidemia, diabetes mellitus, hypertension, male sex, a sedentary lifestyle, family history and obesity.     Review of Systems  Constitutional:  Negative for chills, fatigue, fever and unexpected weight change.  HENT:  Negative for dental problem, mouth sores and trouble swallowing.   Eyes:  Negative for visual disturbance.  Respiratory:  Negative for cough, choking, chest tightness, shortness of breath and wheezing.   Cardiovascular:  Negative for chest pain, palpitations and leg swelling.  Gastrointestinal:  Negative for abdominal distention, abdominal pain, constipation, diarrhea, nausea and vomiting.  Endocrine: Positive for polydipsia and polyuria. Negative for polyphagia.  Genitourinary:  Negative for dysuria, flank pain, hematuria and urgency.  Musculoskeletal:  Negative for back pain, gait problem, myalgias and neck pain.  Skin:  Negative for pallor, rash and wound.  Neurological:  Negative for seizures, syncope, weakness, numbness and headaches.  Psychiatric/Behavioral:  Negative for confusion and dysphoric mood.     Objective:       04/12/2022    1:37 PM 04/07/2022    2:59 PM 04/07/2022    4:47 AM  Vitals with BMI  Height 5\' 8"     Weight 209 lbs    BMI 76.19    Systolic 98 509 326  Diastolic 60 96 78  Pulse 76 87 83    BP 98/60   Pulse 76   Ht 5\' 8"  (1.727 m)   Wt 209 lb (94.8 kg)   BMI 31.78  kg/m   Wt Readings from Last  3 Encounters:  04/12/22 209 lb (94.8 kg)  04/03/22 216 lb 4.3 oz (98.1 kg)  01/21/20 237 lb 11.2 oz (107.8 kg)     Physical Exam Constitutional:      General: He is not in acute distress.    Appearance: He is well-developed.  HENT:     Head: Normocephalic and atraumatic.  Neck:     Thyroid: No thyromegaly.     Trachea: No tracheal deviation.  Cardiovascular:     Rate and Rhythm: Normal rate.     Pulses:          Dorsalis pedis pulses are 1+ on the right side and 1+ on the left side.       Posterior tibial pulses are 1+ on the right side and 1+ on the left side.     Heart sounds: Normal heart sounds, S1 normal and S2 normal. No murmur heard.    No gallop.  Pulmonary:     Effort: Pulmonary effort is normal. No respiratory distress.     Breath sounds: Normal breath sounds. No wheezing.  Musculoskeletal:     Right shoulder: No swelling or deformity.     Cervical back: Normal range of motion and neck supple.  Skin:    General: Skin is warm and dry.     Findings: No rash.     Nails: There is no clubbing.  Neurological:     Mental Status: He is alert and oriented to person, place, and time.     Cranial Nerves: No cranial nerve deficit.     Sensory: No sensory deficit.     Gait: Gait normal.     Deep Tendon Reflexes: Reflexes are normal and symmetric.  Psychiatric:        Speech: Speech normal.        Behavior: Behavior normal. Behavior is cooperative.        Thought Content: Thought content normal.        Judgment: Judgment normal.       CMP ( most recent) CMP     Component Value Date/Time   NA 135 04/07/2022 0553   K 3.3 (L) 04/07/2022 0553   CL 93 (L) 04/07/2022 0553   CO2 31 04/07/2022 0553   GLUCOSE 162 (H) 04/07/2022 0553   BUN 9 04/07/2022 0553   CREATININE 0.78 04/07/2022 0553   CALCIUM 8.6 (L) 04/07/2022 0553   PROT 6.6 04/04/2022 1214   ALBUMIN 2.9 (L) 04/04/2022 1214   AST 35 04/04/2022 1214   ALT 25 04/04/2022 1214    ALKPHOS 61 04/04/2022 1214   BILITOT 1.2 04/04/2022 1214   GFRNONAA >60 04/07/2022 0553   GFRAA >60 09/02/2019 1656     Diabetic Labs (most recent): Lab Results  Component Value Date   HGBA1C 13.1 (H) 04/03/2022   HGBA1C 6.1 (H) 07/08/2014        Lab Results  Component Value Date   TSH 0.904 10/30/2016   TSH 2.280 07/07/2014      Assessment & Plan:   1. Type 2 diabetes mellitus with hyperglycemia, with long-term current use of insulin ([place])  - KIMBER ESTERLY has currently uncontrolled symptomatic type 2 DM since  67 years of age,  with most recent A1c of 13.1 %. Recent labs reviewed. - I had a long discussion with him about the possible risk factors and  the pathology behind its diabetes and its complications. -his diabetes is complicated by obesity/sedentary life and he remains at a high risk for more  acute and chronic complications which include CAD, CVA, CKD, retinopathy, and neuropathy. These are all discussed in detail with him.  - I discussed all available options of managing his diabetes including de-escalation of medications. I have counseled him on Food as Medicine by adopting a Whole Food , Plant Predominant  ( WFPP) nutrition as recommended by [employer] of Lifestyle Medicine. Patient is encouraged to switch to  unprocessed or minimally processed  complex starch, adequate protein intake (mainly plant source), minimal liquid fat, plenty of fruits, and vegetables. -  he is advised to stick to a routine mealtimes to eat 3 complete meals a day and snack only when necessary ( to snack only to correct hypoglycemia BG <70 day time or <100 at night).   - he acknowledges that there is a room for improvement in his food and drink choices. - Further Specific Suggestion is made for him to avoid simple carbohydrates  from his diet including Cakes, Sweet Desserts, Ice Cream, Soda (diet and regular), Sweet Tea, Candies, Chips, Cookies, Store Bought Juices, Alcohol ,  Artificial  Sweeteners,  Coffee Creamer, and "Sugar-free" Products. This will help patient to have more stable blood glucose profile and potentially avoid unintended weight gain.  - he will be scheduled with Norm Salt, RDN, CDE for individualized diabetes education.  - I have approached him with the following individualized plan to manage  his diabetes and patient agrees:   -In light of his prevailing hyperglycemic burden, he will continue to need multiple medications to control his glycemia towards target.  I advised him to adjust his Lantus to 30 units nightly, advised him to use his freestyle libre 3 device continuously, continue Jardiance 25 mg p.o. daily at breakfast, glipizide 5 mg p.o. daily at breakfast, metformin 500 mg p.o. twice daily.  He is advised to return in 2 weeks with his device for reevaluation.   He wishes to simplify his diabetes regimen.  He does not cook at home, exclusively eating out.  - he will be considered for incretin therapy as appropriate next visit.  - Specific targets for  A1c;  LDL, HDL,  and Triglycerides were discussed with the patient.  2) Blood Pressure /Hypertension:  his blood pressure is  controlled to target.   he is advised to continue his current medications including chlorthalidone 25 mg p.o. daily, Lasix as needed.  3) Lipids/Hyperlipidemia: No recent lipid panel to review.  He is advised to continue atorvastatin 40 mg p.o. nightly.    Side effects and precautions discussed with him.  4)  Weight/Diet:  Body mass index is 31.78 kg/m.  -   clearly complicating his diabetes care.   he is  a candidate for weight loss. I discussed with him the fact that loss of 5 - 10% of his  current body weight will have the most impact on his diabetes management.  The above detailed  ACLM recommendations for nutrition, exercise, sleep, social life, avoidance of risky substances, the need for restorative sleep   information will also detailed on discharge instructions.  5)  Chronic Care/Health Maintenance:  -he  is on Statin medications and  is encouraged to initiate and continue to follow up with Ophthalmology, Dentist,  Podiatrist at least yearly or according to recommendations, and advised to   stay away from smoking. I have recommended yearly flu vaccine and pneumonia vaccine at least every 5 years; moderate intensity exercise for up to 150 minutes weekly; and  sleep for 7- 9 hours a  day.  - he is  advised to maintain close follow up with Benita Stabile, MD for primary care needs, as well as his other providers for optimal and coordinated care.   I spent 62 minutes in the care of the patient today including review of labs from CMP, Lipids, Thyroid Function, Hematology (current and previous including abstractions from other facilities); face-to-face time discussing  his blood glucose readings/logs, discussing hypoglycemia and hyperglycemia episodes and symptoms, medications doses, his options of short and long term treatment based on the latest standards of care / guidelines;  discussion about incorporating lifestyle medicine;  and documenting the encounter. Risk reduction counseling performed per USPSTF guidelines to reduce  obesity and cardiovascular risk factors.      Please refer to Patient Instructions for Blood Glucose Monitoring and Insulin/Medications Dosing Guide"  in media tab for additional information. Please  also refer to " Patient Self Inventory" in the Media  tab for reviewed elements of pertinent patient history.  Chuck Hint participated in the discussions, expressed understanding, and voiced agreement with the above plans.  All questions were answered to his satisfaction. he is encouraged to contact clinic should he have any questions or concerns prior to his return visit.   Follow up plan: - Return in about 2 weeks (around 04/26/2022) for F/U with Meter/CGM Megan Salon Only - no Labs.  Marquis Lunch, MD [facility] [address] [place], [place] [address] Phone: [phone]  Fax: [phone]    04/12/2022, 3:22 PM  This note was partially dictated with voice recognition software. Similar sounding words can be transcribed inadequately or may not  be corrected upon review.

## 2022-04-18 ENCOUNTER — Telehealth: Payer: Self-pay

## 2022-04-18 NOTE — Telephone Encounter (Signed)
Lucilla Edin RN with Dr.Hall's office called stating she did a home visit with pt recently and reviewed his medications. She stated there are some discrepancies in medication doses from the list the pt had given [place] at his last visit. States pt is actually taking metformin 1035m bid instead of 5059mdaily, he is also taking glipizide xl 1019mid instead of 5mg49mtates pt is taking jardiance 25mg10m his lantus 30 units daily. I have updated pt's medication list. Please advise of any changes needed.

## 2022-04-18 NOTE — Telephone Encounter (Signed)
Left a message making pt aware we need him to bring all of his medications with him to his next visit.

## 2022-04-24 DIAGNOSIS — E119 Type 2 diabetes mellitus without complications: Secondary | ICD-10-CM | POA: Diagnosis not present

## 2022-04-25 NOTE — Progress Notes (Addendum)
Referring Provider: Celene Squibb, MD Primary Care Physician:  Celene Squibb, MD Primary GI Physician: Dr. Jenetta Downer   Chief Complaint  Patient presents with   Follow-up    Having burning in chest.     HPI:   IHSAAN DANEHY is a 67 y.o. male with history of COPD, diabetes, DVT in 2012, anxiety, alcohol abuse in remission, cirrhosis, presenting today for hospital follow-up.    He was admitted to [facility] in late January with DKA and AKI after presenting with nausea, vomiting, generalized weakness.  GI was consulted due to esophageal wall thickening on CT.  Cirrhosis also noted.  He denied dysphagia/odynophagia, GERD, unintentional weight loss, family history of esophageal cancer.  Chronically taking naproxen.  Recommended outpatient EGD. Regarding cirrhosis, this dated back at least to 2018 but patient never followed by GI. Suspected ETOH in etiology. Now sober x 3 years. MELD 3.0 was 23 on presentation driven by AKI and elevated bilirubin though mostly indirect; improved to 13 on 1/31. Questionable fluctuations in mental status and ammonia checked, slight elevation at 43 otherwise, no symptoms of decompensated liver disease.  He was started on lactulose.  Recommended outpatient EGD, additional serologies to rule out other etiologies.  He also had some fluctuating bowel habits ranging from [facility] to [place] [facility] 7 and was found to be positive for Campylobacter which was treated with azithromycin.  Today: Feeling better overall. Having some burning in the lower esophagus. Triggered by spicy foods. Milk will help resolve this. This started after DKA when he was having vomiting. No N/V since hospitalization. Taking pantoprazole 40 mg daily rather than twice daily. No odynophagia. Admits to dysphagia.  Sometimes bread will get hung in his esophagus.  Rare use of naproxen.   No unintentional weight loss or family history of esophageal cancer.    Cirrhosis: MELD: MELD 3.0 13 on 1/31. [place]:  04/03/2022 with no focal hepatic lesions.  Next ultrasound in early August.  Hep A/B vaccination: No vaccination in the past.  EGD: Never Ascites/peripheral edema:  None.  Diuretics: Lasix 20 mg as needed if feet are swollen Less and less lately. Last took it 1 week ago.  Encephalopathy:  Was taking 30 ml once daily. Some days would have 0-2 Bms per day. Can skip 1-2 days without a BM. Has had hand tremor since high school without change.   No brbpr or melena.  No tylenol.   Has SOB with any physical exertion. Occasional cough. No CP.   Colonoscopy many years ago at [facility]. No polyps per patient.   Past Medical History:  Diagnosis Date   Anxiety    Arthritis    Bronchitis    Chronic diarrhea    Chronic edema of lower extremity 09/28/2010   COPD (chronic obstructive pulmonary disease) (HCC)    Diabetes mellitus without complication ([place])    Diabetes mellitus, type II ([place])    DVT (deep venous thrombosis) ([place]) 09/28/2010   Emphysema of lung (HCC)    Fatigue    H/O Thrombocytopenia 09/28/2010   Hypertension    Left radial fracture    Multiple lung nodules 09/28/2010   Neuropathy     Past Surgical History:  Procedure Laterality Date   CATARACT EXTRACTION W/PHACO Right 01/17/2016   Procedure: CATARACT EXTRACTION PHACO AND INTRAOCULAR LENS PLACEMENT ([place]);  Surgeon: Rutherford Guys, MD;  Location: [facility];  Service: Ophthalmology;  Laterality: Right;  CDE: 4.34   CATARACT EXTRACTION W/PHACO Left 01/31/2016   Procedure:  CATARACT EXTRACTION PHACO AND INTRAOCULAR LENS PLACEMENT ([facility]);  Surgeon: Rutherford Guys, MD;  Location: [facility];  Service: Ophthalmology;  Laterality: Left;  CDE: 5.69   CHOLECYSTECTOMY     MOUTH SURGERY      Current Outpatient Medications  Medication Sig Dispense Refill   acetaminophen (TYLENOL) 500 MG tablet Take 500-1,000 mg by mouth every 6 (six) hours as needed for mild pain.     ALPRAZolam (XANAX) 1 MG tablet Take 1 mg by mouth every 6 (six) hours as  needed.     aspirin EC 81 MG tablet Take 81 mg by mouth daily. Swallow whole.     atorvastatin (LIPITOR) 40 MG tablet Take 40 mg by mouth daily.     Blood Glucose Monitoring Suppl DEVI 1 each by Does not apply route in the morning, at noon, and at bedtime. May substitute to any manufacturer covered by patient's insurance. 1 each 0   busPIRone (BUSPAR) 15 MG tablet Take 1 tablet (15 mg total) by mouth 3 (three) times daily. 90 tablet 0   carboxymethylcellulose (REFRESH TEARS) 0.5 % SOLN 1 drop 2 (two) times daily as needed.      chlorthalidone (HYGROTON) 25 MG tablet Take 1 tablet by mouth every morning.     Continuous Blood Gluc Sensor (FREESTYLE LIBRE 3 SENSOR) MISC Place 1 sensor on the skin every 14 days. Use to check glucose continuously 2 each 0   Cyanocobalamin 2500 MCG TABS Take 1 tablet by mouth daily.     DULoxetine (CYMBALTA) 30 MG capsule Take 30 mg by mouth 2 (two) times daily.     finasteride (PROSCAR) 5 MG tablet Take 5 mg by mouth daily.     furosemide (LASIX) 20 MG tablet Take 1 tablet by mouth daily.     glipiZIDE (GLUCOTROL XL) 10 MG 24 hr tablet Take 10 mg by mouth 2 (two) times daily.     Glucose Blood (BLOOD GLUCOSE TEST STRIPS) STRP 1 each by In Vitro route in the morning, at noon, and at bedtime. May substitute to any manufacturer covered by patient's insurance. 100 strip 0   insulin glargine (LANTUS SOLOSTAR) 100 UNIT/ML Solostar Pen Inject 30 Units into the skin at bedtime. 15 mL 2   Insulin Pen Needle 32G X 4 MM MISC Insulin pen needle 100 each 0   JARDIANCE 25 MG TABS tablet Take 1 tablet by mouth daily.     Lancet Device MISC 1 each by Does not apply route in the morning, at noon, and at bedtime. May substitute to any manufacturer covered by patient's insurance. 1 each 0   Lancets Misc. MISC 1 each by Does not apply route in the morning, at noon, and at bedtime. May substitute to any manufacturer covered by patient's insurance. 100 each 0   levocetirizine (XYZAL) 5 MG  tablet Take 1 tablet by mouth every morning.     lidocaine-prilocaine (EMLA) cream Apply to feet for neuropathic pain     metFORMIN (GLUCOPHAGE) 1000 MG tablet Take 1,000 mg by mouth 2 (two) times daily with a meal.     montelukast (SINGULAIR) 10 MG tablet Take 10 mg by mouth daily.     omega-3 acid ethyl esters (LOVAZA) 1 g capsule Take 2 capsules by mouth daily.     pantoprazole (PROTONIX) 40 MG tablet Take 1 tablet (40 mg total) by mouth 2 (two) times daily before a meal. 60 tablet 0   potassium chloride SA (KLOR-CON M) 20 MEQ tablet Take 1 tablet (20  mEq total) by mouth daily for 5 days. 5 tablet 0   pregabalin (LYRICA) 200 MG capsule Take 200 mg by mouth 3 (three) times daily.     propranolol (INDERAL) 60 MG tablet Take 60 mg by mouth 2 (two) times daily.     Semaglutide (RYBELSUS) 3 MG TABS TAKE 1 TABLET (3 MG) BY ORAL ROUTE ONCE DAILY FOR 30 DAYS, THEN START '7MG'$  TABLET DAILY     lactulose (CHRONULAC) 10 GM/15ML solution Take 30 mLs (20 g total) by mouth 2 (two) times daily. 946 mL 6   No current facility-administered medications for this visit.    Allergies as of 04/26/2022   (No Known Allergies)    Family History  Problem Relation Age of Onset   Diabetes Mother    Clotting disorder Father    Alcohol abuse Maternal Uncle    Lung cancer Maternal Grandfather    Emphysema Paternal Grandfather     Social History   Socioeconomic History   Marital status: Divorced    Spouse name: Not on file   Number of children: 2   Years of education: Not on file   Highest education level: Not on file  Occupational History   Occupation: Retired  Tobacco Use   Smoking status: Former    Packs/day: 1.00    Years: 40.00    Total pack years: 40.00    Types: Cigarettes    Quit date: 01/11/2013    Years since quitting: 9.2   Smokeless tobacco: Never  Vaping Use   Vaping Use: Never used  Substance and Sexual Activity   Alcohol use: No    Comment: stopped drinking 2021   Drug use: No    Sexual activity: Not Currently    Birth control/protection: None  Other Topics Concern   Not on file  Social History Narrative   Not on file   Social Determinants of Health   Financial Resource Strain: Not on file  Food Insecurity: No Food Insecurity (04/03/2022)   Hunger Vital Sign    Worried About Running Out of Food in the Last Year: Never true    [place] in the Last Year: Never true  Transportation Needs: No Transportation Needs (04/03/2022)   PRAPARE - Hydrologist (Medical): No    Lack of Transportation (Non-Medical): No  Physical Activity: Not on file  Stress: Not on file  Social Connections: Not on file    Review of Systems: Gen: Denies fever, chills, cold or flulike symptoms, presyncope, syncope. CV: Denies chest pain, palpitations. Resp: Admits to shortness of breath with activity.  Occasional cough. GI: See HPI Derm: Denies rash. Psych: Denies depression, anxiety. Heme: See HPI  Physical Exam: BP 138/85 (BP Location: Right Arm, Patient Position: Sitting, Cuff Size: Large)   Pulse (!) 101   Temp 97.7 F (36.5 C) (Oral)   Ht '5\' 8"'$  (1.727 m)   Wt 219 lb 9.6 oz (99.6 kg)   SpO2 95%   BMI 33.39 kg/m  General:   Alert and oriented. No distress noted. Pleasant and cooperative. Resting bilateral hand tremor.  Head:  Normocephalic and atraumatic. Eyes:  Conjuctiva clear without scleral icterus. Heart:  S1, S2 present without murmurs appreciated. Lungs:  Clear to auscultation bilaterally. No wheezes, rales, or rhonchi. No distress.  Abdomen:  +BS, obese, soft, non-tender and non-distended. No rebound or guarding. No HSM or masses noted. Msk:  Symmetrical without gross deformities. Normal posture. Extremities:  Without edema. Neurologic:  Alert and  oriented x4. No asterixis.  Psych:  Normal mood and affect.    Assessment:   67 y.o. male with history of COPD, diabetes, DVT in 2012, anxiety, alcohol abuse in remission,  cirrhosis, presenting today for hospital follow-up.    He was admitted to [facility] in late January with DKA and AKI.  GI was consulted due to esophageal wall thickening on CT.  Cirrhosis also noted. As patient had no significant upper GI symptoms, recommended outpatient evaluation. He di have some diarrhea and was found to have Campylobacter and was treated with azithromycin.  Esophageal wall thickening/dysphagia/esophageal burning:  CT A/P without contrast 04/03/2022 with circumferential thickened appearance of the lower esophagus with possible mild paraesophageal fat stranding. Currently reporting occasional dysphagia to breads as well as intermittent esophageal burning, often triggered by spicy foods, that started after vomiting in the setting of DKA. Symptoms may be secondary to GERD/esophagitis, but unable to rule out malignancy. No odynophagia. Taking pantoprazole 40 mg daily rather than twice daily. No unintentional weight loss or family history of esophageal cancer. Rare NSAID use.   He needs EGD for further evaluation of abnormal esophagus on CT and dysphagia. Recommended increasing pantoprazole to 40 mg twice daily as already prescribed.   Cirrhosis:  Dates back to 2018 on imaging. Patient never followed by GI. During admission, he had questionable fluctuations in mental status, ammonia checked and was slightly elevated at 43 otherwise. He was started on lactulose and continued this outpatient, 30 ml daily until about 1 week ago. Would have 0-2 BMs every 1-3 days. Denies mental status changes and clinically not encephalopathic today.  No asterixis on exam.  No other symptoms of decompensated liver disease. HE does take Lasix 20 mg PRN for swelling in his feet, but this has been present for quite some time and was told it was secondary to neuropathy. LFTs have been normal. MELD 3.0 was 23 on day of hospital admission driven by AKI and elevated bilirubin though mostly indirect; improved to 13 on  1/31. History of heavy alcohol use, but abstinent for the last 3 years.  Overall suspect ETOH in etiology. Acute hepatitis panel negative. Will need to evaluate for immunity to hepatitis A and B.  Will also pursue additional serologic workup to rule out hemochromatosis, wilson's disease, alpha-1 antitrypsin deficiency, AIH, PBC.  He also needs EGD for variceal screening.   Next [place] will be due the first of August.   Colon cancer screening:  Colonoscopy many years ago at [facility]. No polyps per patient. Needs colonoscopy updated. We will circle back to this in the near future. Would like to get EGD completed ASAP due to upper GI symptoms and abnormalities on CT discussed above.      Plan:  Proceed with upper endoscopy +/- dilation with propofol by Dr. Jenetta Downer in near future. The risks, benefits, and alternatives have been discussed with the patient in detail. The patient states understanding and desires to proceed.  ASA 3 Separate instructions provided for diabetes medication adjustments. CBC, CMP, INR, AFP, ANA, AMA, ASMA, immunoglobulins, alpha-1 antitrypsin phenotype, ceruloplasmin, hepatitis A antibody total, hepatitis B surface antibody. Increase pantoprazole to 40 mg twice daily as already prescribed. Dysphagia precautions discussed.  Separate written instructions provided on AVS. Increase lactulose to 30 mL twice daily.  New prescription sent to pharmacy.  Recommended goal of 3-4 bowel movements daily. Continue Lasix as needed.  Patient will let me know of any worsening/persistent swelling. 2 g sodium diet. High-protein  diet with goal of 118 g of protein daily (1.2 g of protein /kg of body weight). Continue to avoid all alcohol. Follow-up after EGD.  Will need to discuss scheduling screening colonoscopy at follow-up.   Aliene Altes, PA-C [facility] 04/26/2022   I have reviewed the note and agree with the APP's assessment as described in this progress  note  Maylon Peppers, MD Gastroenterology and Hepatology [facility]

## 2022-04-26 ENCOUNTER — Encounter: Payer: Self-pay | Admitting: Gastroenterology

## 2022-04-26 ENCOUNTER — Ambulatory Visit (INDEPENDENT_AMBULATORY_CARE_PROVIDER_SITE_OTHER): Payer: PPO | Admitting: Gastroenterology

## 2022-04-26 ENCOUNTER — Telehealth: Payer: Self-pay | Admitting: *Deleted

## 2022-04-26 VITALS — BP 138/85 | HR 101 | Temp 97.7°F | Ht 68.0 in | Wt 219.6 lb

## 2022-04-26 DIAGNOSIS — K7460 Unspecified cirrhosis of liver: Secondary | ICD-10-CM

## 2022-04-26 DIAGNOSIS — R1310 Dysphagia, unspecified: Secondary | ICD-10-CM

## 2022-04-26 DIAGNOSIS — R933 Abnormal findings on diagnostic imaging of other parts of digestive tract: Secondary | ICD-10-CM

## 2022-04-26 MED ORDER — LACTULOSE 10 GM/15ML PO SOLN: 20.0000 g | Freq: Two times a day (BID) | ORAL | 6 refills | Status: AC

## 2022-04-26 NOTE — Telephone Encounter (Signed)
Unable to leave message due to mail box being full.  EGD +/- dilation w/Dr.Castaneda ASA 3

## 2022-04-26 NOTE — Patient Instructions (Addendum)
Please have blood work completed at [facility].  We will arrange for an upper endoscopy with stretching of your esophagus in the near future with Dr. Jenetta Downer. You will be on a 24-hour clear liquid diet prior to your procedure. You will have to hold Rybelsus for 1 day prior to your procedure and Jardiance for 3 days prior to your procedure. The night prior to your procedure, take one half dose of Lantus (15 units) at bedtime. Day of your procedure do not take any morning diabetes medications.  Swallowing precautions:  Eat slowly, take small bites, chew thoroughly, drink plenty of liquids throughout meals.  Avoid trough textures All meats should be chopped finely.  If something gets hung in your esophagus and will not come up or go down, proceed to the emergency room.    Take pantoprazole 40 mg twice daily 30 minutes before breakfast and dinner.  For cirrhosis: We are updating blood work.   We are proceeding with an upper endoscopy to evaluate for swollen blood vessels in your esophagus. Continue to avoid all alcohol. You may continue to use Lasix as needed for mild swelling in your feet.  If you have any worsening swelling, please let me know. Increase lactulose to 30 mL twice daily.  The goal is for you to have 3-4 bowel movements every day.  Monitor for any mental status changes/confusion, and please let me know if this occurs. Follow a low-sodium diet.  More than 2000 mg of sodium per 24 hours.  This includes everything that you eat and drink. Follow a high-protein diet.  Ideally you need 1.2 g of protein per kilogram of body weight.  This is around 118 g of protein daily.  To help achieve your protein needs, you can try to drink 1-2 protein shakes daily.  Will plan to see back in the office after your procedure.  Do not hesitate to call sooner if you have questions or concerns.  It was a pleasure to see you today! I want to create trusting relationships with patients. If you receive a  survey regarding your visit,  I greatly appreciate you taking time to fill this out on paper or through your MyChart. I value your feedback.  Aliene Altes, PA-C [facility]

## 2022-04-27 ENCOUNTER — Encounter: Payer: Self-pay | Admitting: *Deleted

## 2022-04-30 ENCOUNTER — Encounter: Payer: Self-pay | Admitting: *Deleted

## 2022-05-03 ENCOUNTER — Ambulatory Visit: Payer: PPO | Admitting: "Endocrinology

## 2022-05-07 DIAGNOSIS — K7460 Unspecified cirrhosis of liver: Secondary | ICD-10-CM | POA: Diagnosis not present

## 2022-05-10 DIAGNOSIS — E119 Type 2 diabetes mellitus without complications: Secondary | ICD-10-CM | POA: Diagnosis not present

## 2022-05-15 ENCOUNTER — Encounter: Payer: PPO | Attending: Internal Medicine | Admitting: Nutrition

## 2022-05-15 ENCOUNTER — Other Ambulatory Visit: Payer: Self-pay | Admitting: *Deleted

## 2022-05-15 VITALS — Ht 68.0 in | Wt 219.0 lb

## 2022-05-15 DIAGNOSIS — E0810 Diabetes mellitus due to underlying condition with ketoacidosis without coma: Secondary | ICD-10-CM

## 2022-05-15 DIAGNOSIS — K7460 Unspecified cirrhosis of liver: Secondary | ICD-10-CM

## 2022-05-15 DIAGNOSIS — E1165 Type 2 diabetes mellitus with hyperglycemia: Secondary | ICD-10-CM | POA: Insufficient documentation

## 2022-05-15 DIAGNOSIS — Z794 Long term (current) use of insulin: Secondary | ICD-10-CM | POA: Diagnosis not present

## 2022-05-15 DIAGNOSIS — Z713 Dietary counseling and surveillance: Secondary | ICD-10-CM | POA: Insufficient documentation

## 2022-05-15 DIAGNOSIS — I10 Essential (primary) hypertension: Secondary | ICD-10-CM

## 2022-05-15 LAB — CBC WITH DIFFERENTIAL/PLATELET
Absolute Monocytes: 750 cells/uL (ref 200–950)
Basophils Absolute: 74 cells/uL (ref 0–200)
Basophils Relative: 0.6 %
Eosinophils Absolute: 234 cells/uL (ref 15–500)
Eosinophils Relative: 1.9 %
HCT: 42.4 % (ref 38.5–50.0)
Hemoglobin: 14.6 g/dL (ref 13.2–17.1)
Lymphs Abs: 2620 cells/uL (ref 850–3900)
MCH: 33.1 pg — ABNORMAL HIGH (ref 27.0–33.0)
MCHC: 34.4 g/dL (ref 32.0–36.0)
MCV: 96.1 fL (ref 80.0–100.0)
MPV: 12.7 fL — ABNORMAL HIGH (ref 7.5–12.5)
Monocytes Relative: 6.1 %
Neutro Abs: 8622 cells/uL — ABNORMAL HIGH (ref 1500–7800)
Neutrophils Relative %: 70.1 %
Platelets: 307 10*3/uL (ref 140–400)
RBC: 4.41 10*6/uL (ref 4.20–5.80)
RDW: 13 % (ref 11.0–15.0)
Total Lymphocyte: 21.3 %
WBC: 12.3 10*3/uL — ABNORMAL HIGH (ref 3.8–10.8)

## 2022-05-15 LAB — COMPLETE METABOLIC PANEL WITH GFR
AG Ratio: 1 (calc) (ref 1.0–2.5)
ALT: 25 U/L (ref 9–46)
AST: 22 U/L (ref 10–35)
Albumin: 4 g/dL (ref 3.6–5.1)
Alkaline phosphatase (APISO): 82 U/L (ref 35–144)
BUN: 20 mg/dL (ref 7–25)
CO2: 32 mmol/L (ref 20–32)
Calcium: 9.8 mg/dL (ref 8.6–10.3)
Chloride: 94 mmol/L — ABNORMAL LOW (ref 98–110)
Creat: 0.84 mg/dL (ref 0.70–1.35)
Globulin: 4 g/dL (calc) — ABNORMAL HIGH (ref 1.9–3.7)
Glucose, Bld: 226 mg/dL — ABNORMAL HIGH (ref 65–99)
Potassium: 4.5 mmol/L (ref 3.5–5.3)
Sodium: 137 mmol/L (ref 135–146)
Total Bilirubin: 0.5 mg/dL (ref 0.2–1.2)
Total Protein: 8 g/dL (ref 6.1–8.1)
eGFR: 95 mL/min/{1.73_m2} (ref >=60)

## 2022-05-15 LAB — IGG, IGA, IGM
IgG (Immunoglobin G), Serum: 1397 mg/dL (ref 600–1540)
IgM, Serum: 38 mg/dL — ABNORMAL LOW (ref 50–300)
Immunoglobulin A: 441 mg/dL — ABNORMAL HIGH (ref 70–320)

## 2022-05-15 LAB — CERULOPLASMIN: Ceruloplasmin: 42 mg/dL — ABNORMAL HIGH (ref 18–36)

## 2022-05-15 LAB — ANA: Anti Nuclear Antibody (ANA): NEGATIVE

## 2022-05-15 LAB — ALPHA-1 ANTITRYPSIN PHENOTYPE: A-1 Antitrypsin, Ser: 159 mg/dL (ref 83–199)

## 2022-05-15 LAB — PROTIME-INR
INR: 1
Prothrombin Time: 10.4 s (ref 9.0–11.5)

## 2022-05-15 LAB — AFP TUMOR MARKER: AFP-Tumor Marker: 1.9 ng/mL (ref <6.1)

## 2022-05-15 LAB — ANTI-SMOOTH MUSCLE ANTIBODY, IGG: Actin (Smooth Muscle) Antibody (IGG): <20 U (ref <20)

## 2022-05-15 LAB — MITOCHONDRIAL ANTIBODIES: Mitochondrial M2 Ab, IgG: <=20 U (ref <=20.0)

## 2022-05-15 LAB — HEPATITIS B SURFACE ANTIBODY,QUALITATIVE: Hep B S Ab: NONREACTIVE

## 2022-05-15 LAB — HEPATITIS A ANTIBODY, TOTAL: Hepatitis A AB,Total: NONREACTIVE

## 2022-05-15 NOTE — Progress Notes (Signed)
Medical Nutrition Therapy  Appointment Start time:  0800  Appointment End time:  0930  Primary concerns today: DM Type 2  Referral diagnosis: E11.8 Preferred learning style: Visual  Learning readiness: Ready   NUTRITION ASSESSMENT  67 yr old wmale here for uncontrolled Type 2 DM. Was in [facility] 04/03/22 for DKA. Recently given and using a CGM, Libre 3. Sees Dr. Dorris Fetch, Endocrinology. PMH: Type 2 DM, Obesity, Cirrhosis of Liver, History of alcohol and cigarrette abuse and both in remission, HTN, Hyperlipidemia, GERD. Followed by GI.  He is unsure how long he has  had DM Type 2. PCP Dr. Juel Burrow office. For his DM, he is on 30 units of Lantus, Glipzide, Jardiance, Metformin and Rybelsus.  Per his CGM LIbre 3 for the last week, his BS have been 85% in target rage and 15% above target range. He notes he is trying to eat better with more fruits and vegetables this past week. He notes he loves chocolate milk and milk as it helps his reflux a lot. He didn't realize his chocolate milk or ice cream made his BS go up so high. He is working on eating healthier foods and eating more consistent meals. He has been drinking propel and some water.  He notes he can not exericse due to neuropathy in his feet and legs. Willing to   He is willing to work on Lifestyle Medicine to help improve his DM, lose weight and reduce his other chronic conditions.  Anthropometrics  Wt Readings from Last 3 Encounters:  05/15/22 219 lb (99.3 kg)  04/26/22 219 lb 9.6 oz (99.6 kg)  04/12/22 209 lb (94.8 kg)   Ht Readings from Last 3 Encounters:  05/15/22 '5\' 8"'$  (1.727 m)  04/26/22 '5\' 8"'$  (1.727 m)  04/12/22 '5\' 8"'$  (1.727 m)   Body mass index is 33.3 kg/m. '@BMIFA'$ @ Facility age limit for growth %iles is 20 years. Facility age limit for growth %iles is 20 years.    Clinical Medical Hx: DM Type 2, Obesity, Cirrhosis of liver, GERD, HTN, Hyperlipidemia, depression and anxiety Medications: Rybelsus, Glipizide,  Lantus, Jardiance Labs:  Lab Results  Component Value Date   HGBA1C 13.1 (H) 04/03/2022      Latest Ref Rng & Units 05/07/2022    1:39 PM 04/07/2022    5:53 AM 04/06/2022    5:23 PM  CMP  Glucose 65 - 99 mg/dL 226  162  143   BUN 7 - 25 mg/dL '20  9  9   '$ Creatinine 0.70 - 1.35 mg/dL 0.84  0.78  0.80   Sodium 135 - 146 mmol/L 137  135  136   Potassium 3.5 - 5.3 mmol/L 4.5  3.3  3.1   Chloride 98 - 110 mmol/L 94  93  92   CO2 20 - 32 mmol/L 32  31  30   Calcium 8.6 - 10.3 mg/dL 9.8  8.6  8.8   Total Protein 6.1 - 8.1 g/dL 8.0     Total Bilirubin 0.2 - 1.2 mg/dL 0.5     AST 10 - 35 U/L 22     ALT 9 - 46 U/L 25       Notable Signs/Symptoms: Chronic fatigue, neuropathy in feet, increased thirst, blurry vision  Lifestyle & Dietary Hx Lives by himself  Estimated daily fluid intake: 60 oz Supplements: See chart Sleep: a lot Stress / self-care: his health Current average weekly physical activity: ADL due to neuropathy  24-Hr Dietary Recall Eats 2-3 meals  per day. Drinks water, propel and chocolate milk/milk. Cooks meals at home. Has improve his food choices recently  Estimated Energy Needs Calories: 1600 Carbohydrate: 180g Protein: 120g Fat: 44g   NUTRITION DIAGNOSIS  NB-1.1 Food and nutrition-related knowledge deficit As related to Excessive carb, fat and salt intake.  As evidenced by DM Type 2, A1C 13.2%, Obesity.   NUTRITION INTERVENTION  Nutrition education (E-1) on the following topics:  Nutrition and Diabetes education provided on My Plate, CHO counting, meal planning, portion sizes, timing of meals, avoiding snacks between meals unless having a low blood sugar, target ranges for A1C and blood sugars, signs/symptoms and treatment of hyper/hypoglycemia, monitoring blood sugars, taking medications as prescribed, benefits of exercising 30 minutes per day and prevention of complications of DM.  Lifestyle Medicine  - Whole Food, Plant Predominant Nutrition is highly  recommended: Eat Plenty of vegetables, Mushrooms, fruits, Legumes, Whole Grains, Nuts, seeds in lieu of processed meats, processed snacks/pastries red meat, poultry, eggs.    -It is better to avoid simple carbohydrates including: Cakes, Sweet Desserts, Ice Cream, Soda (diet and regular), Sweet Tea, Candies, Chips, Cookies, Store Bought Juices, Alcohol in Excess of  1-2 drinks a day, Lemonade,  Artificial Sweeteners, Doughnuts, Coffee Creamers, "Sugar-free" Products, etc, etc.  This is not a complete list.....  Exercise: If you are able: 30 -60 minutes a day ,4 days a week, or 150 minutes a week.  The longer the better.  Combine stretch, strength, and aerobic activities.  If you were told in the past that you have high risk for cardiovascular diseases, you may seek evaluation by your heart doctor prior to initiating moderate to intense exercise programs.   Handouts Provided Include  Hyper/hypoglycemia signs and symptoms and treatment Lifestyle Medicine Target A1C's Heart Health with DM   Learning Style & Readiness for Change Teaching method utilized: Visual & Auditory  Demonstrated degree of understanding via: Teach Back  Barriers to learning/adherence to lifestyle change: None  Goals Established by Pt Goals  Eat three meals per day at times discussed. Focus on whole plant based foods from a garden Drink only water Cut out chocolate milk, propel and only drink water Get A1C down to 7% Avoid eating after 7 pm. Use CGM to guide food decisions based on blood sugars.   MONITORING & EVALUATION Dietary intake, weekly physical activity, and *** in ***.  Next Steps  Patient is to ***.

## 2022-05-15 NOTE — Patient Instructions (Signed)
Goals  Eat three meals per day at times discussed. Focus on whole plant based foods from a garden Drink only water Cut out chocolate milk, propel and only drink water Get A1C down to 7% Avoid eating after 7 pm. Use CGM to guide food decisions based on blood sugars.

## 2022-05-16 ENCOUNTER — Encounter: Payer: Self-pay | Admitting: Nutrition

## 2022-05-17 DIAGNOSIS — K7460 Unspecified cirrhosis of liver: Secondary | ICD-10-CM | POA: Diagnosis not present

## 2022-05-18 DIAGNOSIS — E669 Obesity, unspecified: Secondary | ICD-10-CM | POA: Diagnosis not present

## 2022-05-18 DIAGNOSIS — I10 Essential (primary) hypertension: Secondary | ICD-10-CM | POA: Diagnosis not present

## 2022-05-18 DIAGNOSIS — K222 Esophageal obstruction: Secondary | ICD-10-CM | POA: Diagnosis not present

## 2022-05-18 DIAGNOSIS — H6693 Otitis media, unspecified, bilateral: Secondary | ICD-10-CM | POA: Diagnosis not present

## 2022-05-18 DIAGNOSIS — H938X9 Other specified disorders of ear, unspecified ear: Secondary | ICD-10-CM | POA: Diagnosis not present

## 2022-05-18 DIAGNOSIS — Z7984 Long term (current) use of oral hypoglycemic drugs: Secondary | ICD-10-CM | POA: Diagnosis not present

## 2022-05-18 DIAGNOSIS — Z87891 Personal history of nicotine dependence: Secondary | ICD-10-CM | POA: Diagnosis not present

## 2022-05-18 DIAGNOSIS — E1100 Type 2 diabetes mellitus with hyperosmolarity without nonketotic hyperglycemic-hyperosmolar coma (NKHHC): Secondary | ICD-10-CM | POA: Diagnosis not present

## 2022-05-18 DIAGNOSIS — Z6835 Body mass index (BMI) 35.0-35.9, adult: Secondary | ICD-10-CM | POA: Diagnosis not present

## 2022-05-18 DIAGNOSIS — K7460 Unspecified cirrhosis of liver: Secondary | ICD-10-CM | POA: Diagnosis not present

## 2022-05-18 DIAGNOSIS — E1310 Other specified diabetes mellitus with ketoacidosis without coma: Secondary | ICD-10-CM | POA: Diagnosis not present

## 2022-05-18 DIAGNOSIS — E1110 Type 2 diabetes mellitus with ketoacidosis without coma: Secondary | ICD-10-CM | POA: Diagnosis not present

## 2022-05-21 DIAGNOSIS — Z23 Encounter for immunization: Secondary | ICD-10-CM | POA: Diagnosis not present

## 2022-05-21 LAB — COPPER, URINE, 24 HOUR
Copper,Urine (24 Hr): 19 mcg/24 h (ref 15–60)
Total Volume: 1700 mL

## 2022-05-25 ENCOUNTER — Encounter (HOSPITAL_COMMUNITY)
Admission: RE | Admit: 2022-05-25 | Discharge: 2022-05-25 | Disposition: A | Discharge status: Home / Self Care | Payer: PPO | Source: Ambulatory Visit | Attending: Gastroenterology | Admitting: Gastroenterology

## 2022-05-29 ENCOUNTER — Ambulatory Visit (HOSPITAL_COMMUNITY)
Admission: RE | Admit: 2022-05-29 | Discharge: 2022-05-29 | Disposition: A | Discharge status: Home / Self Care | Payer: PPO | Attending: Gastroenterology | Admitting: Gastroenterology

## 2022-05-29 ENCOUNTER — Encounter (HOSPITAL_COMMUNITY)
Admission: RE | Disposition: A | Discharge status: Home / Self Care | Payer: Self-pay | Source: Home / Self Care | Attending: Gastroenterology

## 2022-05-29 ENCOUNTER — Ambulatory Visit (HOSPITAL_BASED_OUTPATIENT_CLINIC_OR_DEPARTMENT_OTHER): Payer: PPO | Admitting: Anesthesiology

## 2022-05-29 ENCOUNTER — Ambulatory Visit (HOSPITAL_COMMUNITY): Payer: PPO | Admitting: Anesthesiology

## 2022-05-29 DIAGNOSIS — Z7984 Long term (current) use of oral hypoglycemic drugs: Secondary | ICD-10-CM | POA: Insufficient documentation

## 2022-05-29 DIAGNOSIS — Z87891 Personal history of nicotine dependence: Secondary | ICD-10-CM

## 2022-05-29 DIAGNOSIS — Z794 Long term (current) use of insulin: Secondary | ICD-10-CM | POA: Diagnosis not present

## 2022-05-29 DIAGNOSIS — E1140 Type 2 diabetes mellitus with diabetic neuropathy, unspecified: Secondary | ICD-10-CM | POA: Diagnosis not present

## 2022-05-29 DIAGNOSIS — R933 Abnormal findings on diagnostic imaging of other parts of digestive tract: Secondary | ICD-10-CM

## 2022-05-29 DIAGNOSIS — E119 Type 2 diabetes mellitus without complications: Secondary | ICD-10-CM | POA: Diagnosis not present

## 2022-05-29 DIAGNOSIS — R1310 Dysphagia, unspecified: Secondary | ICD-10-CM | POA: Diagnosis not present

## 2022-05-29 DIAGNOSIS — J449 Chronic obstructive pulmonary disease, unspecified: Secondary | ICD-10-CM | POA: Diagnosis not present

## 2022-05-29 DIAGNOSIS — Z79899 Other long term (current) drug therapy: Secondary | ICD-10-CM | POA: Diagnosis not present

## 2022-05-29 DIAGNOSIS — M1990 Unspecified osteoarthritis, unspecified site: Secondary | ICD-10-CM | POA: Diagnosis not present

## 2022-05-29 DIAGNOSIS — G709 Myoneural disorder, unspecified: Secondary | ICD-10-CM | POA: Diagnosis not present

## 2022-05-29 DIAGNOSIS — K3189 Other diseases of stomach and duodenum: Secondary | ICD-10-CM

## 2022-05-29 DIAGNOSIS — K7460 Unspecified cirrhosis of liver: Secondary | ICD-10-CM | POA: Diagnosis not present

## 2022-05-29 DIAGNOSIS — K209 Esophagitis, unspecified without bleeding: Secondary | ICD-10-CM

## 2022-05-29 DIAGNOSIS — I10 Essential (primary) hypertension: Secondary | ICD-10-CM

## 2022-05-29 DIAGNOSIS — Z86718 Personal history of other venous thrombosis and embolism: Secondary | ICD-10-CM | POA: Insufficient documentation

## 2022-05-29 DIAGNOSIS — J439 Emphysema, unspecified: Secondary | ICD-10-CM | POA: Diagnosis not present

## 2022-05-29 DIAGNOSIS — E1149 Type 2 diabetes mellitus with other diabetic neurological complication: Secondary | ICD-10-CM | POA: Diagnosis not present

## 2022-05-29 DIAGNOSIS — R12 Heartburn: Secondary | ICD-10-CM | POA: Diagnosis not present

## 2022-05-29 DIAGNOSIS — Q438 Other specified congenital malformations of intestine: Secondary | ICD-10-CM | POA: Insufficient documentation

## 2022-05-29 DIAGNOSIS — F419 Anxiety disorder, unspecified: Secondary | ICD-10-CM | POA: Insufficient documentation

## 2022-05-29 DIAGNOSIS — K2100 Gastro-esophageal reflux disease with esophagitis, without bleeding: Secondary | ICD-10-CM | POA: Insufficient documentation

## 2022-05-29 DIAGNOSIS — K2210 Ulcer of esophagus without bleeding: Secondary | ICD-10-CM | POA: Diagnosis not present

## 2022-05-29 DIAGNOSIS — Q402 Other specified congenital malformations of stomach: Secondary | ICD-10-CM | POA: Diagnosis not present

## 2022-05-29 HISTORY — PX: ESOPHAGEAL DILATION: SHX303

## 2022-05-29 HISTORY — PX: ESOPHAGOGASTRODUODENOSCOPY (EGD) WITH PROPOFOL: SHX5813

## 2022-05-29 HISTORY — PX: BIOPSY: SHX5522

## 2022-05-29 LAB — GLUCOSE, CAPILLARY: Glucose-Capillary: 226 mg/dL — ABNORMAL HIGH (ref 70–99)

## 2022-05-29 SURGERY — ESOPHAGOGASTRODUODENOSCOPY (EGD) WITH PROPOFOL
Anesthesia: General

## 2022-05-29 MED ORDER — LIDOCAINE HCL (CARDIAC) PF 100 MG/5ML IV SOSY
PREFILLED_SYRINGE | INTRAVENOUS | Status: DC | PRN
  Administered 2022-05-29: 50 mg via INTRAVENOUS

## 2022-05-29 MED ORDER — PROPOFOL 500 MG/50ML IV EMUL
INTRAVENOUS | Status: DC | PRN
  Administered 2022-05-29: 150 ug/kg/min via INTRAVENOUS

## 2022-05-29 MED ORDER — SUCRALFATE 1 GM/10ML PO SUSP: 1.0000 g | Freq: Three times a day (TID) | ORAL | 0 refills | Status: AC

## 2022-05-29 MED ORDER — PROPOFOL 10 MG/ML IV BOLUS
INTRAVENOUS | Status: DC | PRN
  Administered 2022-05-29: 100 mg via INTRAVENOUS

## 2022-05-29 MED ORDER — LACTATED RINGERS IV SOLN: INTRAVENOUS | Status: DC

## 2022-05-29 MED ORDER — OMEPRAZOLE 40 MG PO CPDR: 40.0000 mg | DELAYED_RELEASE_CAPSULE | Freq: Two times a day (BID) | ORAL | 3 refills | Status: AC

## 2022-05-29 NOTE — H&P (Signed)
Wesley Ho is an 67 y.o. male.   Chief Complaint: Retrosternal chest pain and heartburn HPI: 67 y.o. male with history of COPD, diabetes, DVT in 2012, anxiety, alcohol abuse in remission, cirrhosis, presenting today for retrosternal chest pain, heartburn and questionable dysphagia.  Patient reports having discomfort in his chest when swallowing solid food.  No clear-cut dysphagia.  Reported the pain is described as burning sensation in his mid chest.  Past Medical History:  Diagnosis Date   Anxiety    Arthritis    Bronchitis    Chronic diarrhea    Chronic edema of lower extremity 09/28/2010   COPD (chronic obstructive pulmonary disease) (HCC)    Diabetes mellitus without complication ([place])    Diabetes mellitus, type II ([place])    DVT (deep venous thrombosis) ([place]) 09/28/2010   Emphysema of lung (HCC)    Fatigue    H/O Thrombocytopenia 09/28/2010   Hypertension    Left radial fracture    Multiple lung nodules 09/28/2010   Neuropathy     Past Surgical History:  Procedure Laterality Date   CATARACT EXTRACTION W/PHACO Right 01/17/2016   Procedure: CATARACT EXTRACTION PHACO AND INTRAOCULAR LENS PLACEMENT ([place]);  Surgeon: Rutherford Guys, MD;  Location: [facility];  Service: Ophthalmology;  Laterality: Right;  CDE: 4.34   CATARACT EXTRACTION W/PHACO Left 01/31/2016   Procedure: CATARACT EXTRACTION PHACO AND INTRAOCULAR LENS PLACEMENT ([facility]);  Surgeon: Rutherford Guys, MD;  Location: [facility];  Service: Ophthalmology;  Laterality: Left;  CDE: 5.69   CHOLECYSTECTOMY     MOUTH SURGERY      Family History  Problem Relation Age of Onset   Diabetes Mother    Clotting disorder Father    Alcohol abuse Maternal Uncle    Lung cancer Maternal Grandfather    Emphysema Paternal Grandfather    Social History:  reports that he quit smoking about 9 years ago. He has a 40.00 pack-year smoking history. He has never used smokeless tobacco. He reports that he does not drink alcohol and does not use  drugs.  Allergies: No Known Allergies  Medications Prior to Admission  Medication Sig Dispense Refill   acetaminophen (TYLENOL) 500 MG tablet Take 500-1,000 mg by mouth every 6 (six) hours as needed for mild pain.     ALPRAZolam (XANAX) 1 MG tablet Take 1 mg by mouth every 6 (six) hours as needed for anxiety.     atorvastatin (LIPITOR) 80 MG tablet Take 80 mg by mouth daily.     busPIRone (BUSPAR) 15 MG tablet Take 1 tablet (15 mg total) by mouth 3 (three) times daily. (Patient taking differently: Take 15 mg by mouth 2 (two) times daily.) 90 tablet 0   carboxymethylcellulose (REFRESH TEARS) 0.5 % SOLN Place 1 drop into both eyes 2 (two) times daily as needed (dry eyes).     chlorthalidone (HYGROTON) 25 MG tablet Take 25 mg by mouth every morning.     Cyanocobalamin 2500 MCG TABS Take 2,500 mcg by mouth daily.     DULoxetine (CYMBALTA) 30 MG capsule Take 30 mg by mouth 2 (two) times daily.     finasteride (PROSCAR) 5 MG tablet Take 5 mg by mouth daily.     furosemide (LASIX) 20 MG tablet Take 20 mg by mouth daily.     glipiZIDE (GLUCOTROL XL) 10 MG 24 hr tablet Take 10 mg by mouth 2 (two) times daily.     insulin glargine (LANTUS SOLOSTAR) 100 UNIT/ML Solostar Pen Inject 30 Units into the skin at  bedtime. 15 mL 2   JARDIANCE 25 MG TABS tablet Take 25 mg by mouth daily.     lactulose (CHRONULAC) 10 GM/15ML solution Take 30 mLs (20 g total) by mouth 2 (two) times daily. 946 mL 6   levocetirizine (XYZAL) 5 MG tablet Take 5 mg by mouth every morning.     lidocaine-prilocaine (EMLA) cream Apply 1 Application topically daily as needed (pain).     metFORMIN (GLUCOPHAGE) 1000 MG tablet Take 1,000 mg by mouth 2 (two) times daily with a meal.     montelukast (SINGULAIR) 10 MG tablet Take 10 mg by mouth daily.     omega-3 acid ethyl esters (LOVAZA) 1 g capsule Take 2 g by mouth daily.     pantoprazole (PROTONIX) 40 MG tablet Take 1 tablet (40 mg total) by mouth 2 (two) times daily before a meal. 60  tablet 0   pregabalin (LYRICA) 200 MG capsule Take 200 mg by mouth 2 (two) times daily.     propranolol (INDERAL) 60 MG tablet Take 60 mg by mouth 2 (two) times daily.     Semaglutide (RYBELSUS) 3 MG TABS TAKE 1 TABLET (3 MG) BY ORAL ROUTE ONCE DAILY FOR 30 DAYS, THEN START 7MG  TABLET DAILY     Blood Glucose Monitoring Suppl DEVI 1 each by Does not apply route in the morning, at noon, and at bedtime. May substitute to any manufacturer covered by patient's insurance. 1 each 0   Continuous Blood Gluc Sensor (FREESTYLE LIBRE 3 SENSOR) MISC Place 1 sensor on the skin every 14 days. Use to check glucose continuously 2 each 0   Insulin Pen Needle 32G X 4 MM MISC Insulin pen needle 100 each 0   potassium chloride SA (KLOR-CON M) 20 MEQ tablet Take 1 tablet (20 mEq total) by mouth daily for 5 days. (Patient not taking: Reported on 05/23/2022) 5 tablet 0    Results for orders placed or performed during the hospital encounter of 05/29/22 (from the past 48 hour(s))  Glucose, capillary     Status: Abnormal   Collection Time: 05/29/22  9:20 AM  Result Value Ref Range   Glucose-Capillary 226 (H) 70 - 99 mg/dL    Comment: Glucose reference range applies only to samples taken after fasting for at least 8 hours.   No results found.  Review of Systems  Cardiovascular:  Positive for chest pain.  All other systems reviewed and are negative.   Blood pressure (!) 170/96, pulse 93, temperature 97.8 F (36.6 C), resp. rate 14, SpO2 94 %. Physical Exam  GENERAL: The patient is AO x3, in no acute distress. HEENT: Head is normocephalic and atraumatic. EOMI are intact. Mouth is well hydrated and without lesions. NECK: Supple. No masses LUNGS: Clear to auscultation. No presence of rhonchi/wheezing/rales. Adequate chest expansion HEART: RRR, normal s1 and s2. ABDOMEN: Soft, nontender, no guarding, no peritoneal signs, and nondistended. BS +. No masses. EXTREMITIES: Without any cyanosis, clubbing, rash, lesions or  edema. NEUROLOGIC: AOx3, no focal motor deficit. SKIN: no jaundice, no rashes  Assessment/Plan 67 y.o. male with history of COPD, diabetes, DVT in 2012, anxiety, alcohol abuse in remission, cirrhosis, presenting today for retrosternal chest pain, heartburn and questionable dysphagia.  Will proceed with EGD.  Harvel Quale, MD 05/29/2022, 10:51 AM

## 2022-05-29 NOTE — Anesthesia Preprocedure Evaluation (Signed)
Anesthesia Evaluation  Patient identified by MRN, date of birth, ID band Patient awake    Reviewed: Allergy & Precautions, H&P , NPO status , Patient's Chart, lab work & pertinent test results, reviewed documented beta blocker date and time   History of Anesthesia Complications Negative for: history of anesthetic complications  Airway Mallampati: II  TM Distance: >3 FB Neck ROM: Full    Dental no notable dental hx. (+) Missing, Chipped, Poor Dentition, Dental Advisory Given   Pulmonary COPD, former smoker   Pulmonary exam normal breath sounds clear to auscultation       Cardiovascular Exercise Tolerance: Poor hypertension, Pt. on medications and Pt. on home beta blockers + DVT  Normal cardiovascular exam Rhythm:Regular Rate:Normal     Neuro/Psych  PSYCHIATRIC DISORDERS Anxiety      Neuromuscular disease (tremors)    GI/Hepatic ,GERD  Medicated,,(+) Cirrhosis     substance abuse (h/o alcohol abuse)  alcohol use  Endo/Other  negative endocrine ROSdiabetes, Well Controlled, Type 2, Oral Hypoglycemic Agents, Insulin Dependent    Renal/GU Renal disease  negative genitourinary   Musculoskeletal  (+) Arthritis , Osteoarthritis,    Abdominal   Peds negative pediatric ROS (+)  Hematology  (+) Blood dyscrasia (thrombocytopenia), anemia   Anesthesia Other Findings COMPARISON:  09/02/2019   FINDINGS: Chronic interstitial coarsening from emphysema by 2018 chest CT. There is no edema, consolidation, effusion, or pneumothorax. Normal heart size and stable mediastinal contours. Artifact from EKG leads.   IMPRESSION: No evidence of acute disease.   Emphysema     Electronically Signed   By: Jorje Guild M.D.   On: 04/03/2022 04:25   IMPRESSION: 1. No acute intra-abdominal finding. 2. Circumferential thickened appearance of the lower esophagus, correlate for esophagitis symptoms. 3. Cirrhosis and extensive  atherosclerosis.     Electronically Signed   By: Jorje Guild M.D.   On: 04/03/2022 04:19     Reproductive/Obstetrics negative OB ROS                             Anesthesia Physical Anesthesia Plan  ASA: 3  Anesthesia Plan: General   Post-op Pain Management: Minimal or no pain anticipated   Induction: Intravenous  PONV Risk Score and Plan: Propofol infusion  Airway Management Planned: Nasal Cannula and Natural Airway  Additional Equipment:   Intra-op Plan:   Post-operative Plan:   Informed Consent: I have reviewed the patients History and Physical, chart, labs and discussed the procedure including the risks, benefits and alternatives for the proposed anesthesia with the patient or authorized representative who has indicated his/her understanding and acceptance.     Dental advisory given  Plan Discussed with: CRNA and Surgeon  Anesthesia Plan Comments:         Anesthesia Quick Evaluation

## 2022-05-29 NOTE — Transfer of Care (Signed)
Immediate Anesthesia Transfer of Care Note  Patient: Wesley Ho  Procedure(s) Performed: ESOPHAGOGASTRODUODENOSCOPY (EGD) WITH PROPOFOL ESOPHAGEAL DILATION BIOPSY  Patient Location: Short Stay  Anesthesia Type:General  Level of Consciousness: drowsy  Airway & Oxygen Therapy: Patient Spontanous Breathing and Patient connected to nasal cannula oxygen  Post-op Assessment: Report given to RN and Post -op Vital signs reviewed and stable  Post vital signs: Reviewed and stable  Last Vitals:  Vitals Value Taken Time  BP    Temp    Pulse    Resp    SpO2      Last Pain:  Vitals:   05/29/22 1058  PainSc: 0-No pain         Complications: No notable events documented.

## 2022-05-29 NOTE — Discharge Instructions (Addendum)
You are being discharged to home.  Resume your previous diet.  We are waiting for your pathology results.  Take Prilosec (omeprazole) 40 mg by mouth twice a day. Stop pantoprazole. Take Carafate (sucralfate) suspension 1 gram by mouth four times a day for four weeks.  Your physician has recommended a repeat upper endoscopy in two months for surveillance.  patient should take medication in the morning and evening 30-45 minutes before eating breakfast and supper.

## 2022-05-29 NOTE — Anesthesia Postprocedure Evaluation (Signed)
Anesthesia Post Note  Patient: ALDOR CORBO  Procedure(s) Performed: ESOPHAGOGASTRODUODENOSCOPY (EGD) WITH PROPOFOL ESOPHAGEAL DILATION BIOPSY  Patient location during evaluation: Phase II Anesthesia Type: General Level of consciousness: awake and alert and oriented Pain management: pain level controlled Vital Signs Assessment: post-procedure vital signs reviewed and stable Respiratory status: spontaneous breathing, nonlabored ventilation and respiratory function stable Cardiovascular status: blood pressure returned to baseline and stable Postop Assessment: no apparent nausea or vomiting Anesthetic complications: no  No notable events documented.   Last Vitals:  Vitals:   05/29/22 0930 05/29/22 1115  BP: (!) 170/96 136/67  Pulse: 93 88  Resp: 14 20  Temp: 36.6 C 36.9 C  SpO2: 94% 97%    Last Pain:  Vitals:   05/29/22 1120  PainSc: 0-No pain                 Brenee Gajda C Melchor Kirchgessner

## 2022-05-29 NOTE — Op Note (Signed)
[facility] Patient Name: Wesley Ho Procedure Date: 05/29/2022 10:52 AM MRN: [id number] Date of Birth: 01-10-1956 Attending MD: Maylon Peppers , , [id number] CSN: [id number] Age: 67 Admit Type: Outpatient Procedure:                Upper GI endoscopy Indications:              Odynophagia, Heartburn, Abnormal CT of the GI tract Providers:                Maylon Peppers, Rosina Lowenstein, RN, Aram Candela Referring MD:              Medicines:                Monitored Anesthesia Care Complications:            No immediate complications. Estimated Blood Loss:     Estimated blood loss: none. Procedure:                Pre-Anesthesia Assessment:                           - Prior to the procedure, a History and Physical                            was performed, and patient medications, allergies                            and sensitivities were reviewed. The patient's                            tolerance of previous anesthesia was reviewed.                           - The risks and benefits of the procedure and the                            sedation options and risks were discussed with the                            patient. All questions were answered and informed                            consent was obtained.                           - ASA Grade Assessment: III - A patient with severe                            systemic disease.                           After obtaining informed consent, the endoscope was                            passed under direct vision. Throughout the  procedure, the patient's blood pressure, pulse, and                            oxygen saturations were monitored continuously. The                            [id number] scope was introduced through the                            mouth, and advanced to the second part of duodenum.                            The upper GI endoscopy was accomplished without                             difficulty. The patient tolerated the procedure                            well. Scope In: 11:02:40 AM Scope Out: 11:10:38 AM Total Procedure Duration: 0 hours 7 minutes 58 seconds  Findings:      LA Grade D (one or more mucosal breaks involving at least 75% of       esophageal circumference) esophagitis with no bleeding was found 40 to       43 cm from the incisors. Biopsies were taken with a cold forceps for       histology.      The stomach was normal.      Localized nodular mucosa was found in the duodenal bulb. Biopsies were       taken with a cold forceps for histology.      The exam of the duodenum was otherwise normal. Impression:               - LA Grade D esophagitis with no bleeding. Biopsied.                           - Normal stomach.                           - Nodular mucosa in the duodenal bulb. Biopsied. Moderate Sedation:      Per Anesthesia Care Recommendation:           - Discharge patient to home (ambulatory).                           - Resume previous diet.                           - Await pathology results.                           - Use Prilosec (omeprazole) 40 mg PO BID. Stop                            pantoprazole.                           -  Use sucralfate suspension 1 gram PO QID for 4                            weeks.                           - Repeat upper endoscopy in 2 months for                            surveillance. Procedure Code(s):        --- Professional ---                           [id number], Esophagogastroduodenoscopy, flexible,                            transoral; with biopsy, single or multiple Diagnosis Code(s):        --- Professional ---                           K20.90, Esophagitis, unspecified without bleeding                           K31.89, Other diseases of stomach and duodenum                           R13.10, Dysphagia, unspecified                           R12, Heartburn                           R93.3, Abnormal  findings on diagnostic imaging of                            other parts of digestive tract CPT copyright 2022 American [employer] Association. All rights reserved. The codes documented in this report are preliminary and upon coder review may  be revised to meet current compliance requirements. Maylon Peppers, MD Maylon Peppers,  05/29/2022 11:21:32 AM This report has been signed electronically. Number of Addenda: 0

## 2022-05-29 NOTE — Anesthesia Procedure Notes (Signed)
Date/Time: 05/29/2022 11:02 AM  Performed by: Orlie Dakin, CRNAPre-anesthesia Checklist: Patient identified, Suction available, Emergency Drugs available and Patient being monitored Patient Re-evaluated:Patient Re-evaluated prior to induction Oxygen Delivery Method: Nasal cannula Induction Type: IV induction Placement Confirmation: positive ETCO2

## 2022-06-01 LAB — SURGICAL PATHOLOGY

## 2022-06-07 ENCOUNTER — Encounter (HOSPITAL_COMMUNITY): Payer: Self-pay | Admitting: Gastroenterology

## 2022-06-13 ENCOUNTER — Encounter: Payer: PPO | Attending: Internal Medicine | Admitting: Nutrition

## 2022-06-13 ENCOUNTER — Other Ambulatory Visit: Payer: Self-pay

## 2022-06-13 ENCOUNTER — Encounter: Payer: Self-pay | Admitting: "Endocrinology

## 2022-06-13 ENCOUNTER — Encounter: Payer: Self-pay | Admitting: Nutrition

## 2022-06-13 ENCOUNTER — Ambulatory Visit (INDEPENDENT_AMBULATORY_CARE_PROVIDER_SITE_OTHER): Payer: PPO | Admitting: "Endocrinology

## 2022-06-13 VITALS — BP 136/84 | HR 84 | Ht 68.0 in | Wt 218.0 lb

## 2022-06-13 VITALS — Ht 68.0 in | Wt 218.0 lb

## 2022-06-13 DIAGNOSIS — I10 Essential (primary) hypertension: Secondary | ICD-10-CM

## 2022-06-13 DIAGNOSIS — E0810 Diabetes mellitus due to underlying condition with ketoacidosis without coma: Secondary | ICD-10-CM

## 2022-06-13 DIAGNOSIS — E782 Mixed hyperlipidemia: Secondary | ICD-10-CM | POA: Diagnosis not present

## 2022-06-13 DIAGNOSIS — E1165 Type 2 diabetes mellitus with hyperglycemia: Secondary | ICD-10-CM | POA: Diagnosis not present

## 2022-06-13 DIAGNOSIS — Z794 Long term (current) use of insulin: Secondary | ICD-10-CM

## 2022-06-13 DIAGNOSIS — E66812 Obesity, class 2: Secondary | ICD-10-CM

## 2022-06-13 MED ORDER — LANTUS SOLOSTAR 100 UNIT/ML ~~LOC~~ SOPN: 50.0000 [IU] | PEN_INJECTOR | Freq: Every day | SUBCUTANEOUS | 1 refills | Status: DC

## 2022-06-13 NOTE — Progress Notes (Signed)
Medical Nutrition Therapy  Appointment Start time:  1000 Appointment End time:  1100  Primary concerns today: DM Type 2  Referral diagnosis: E11.8 Preferred learning style: Visual  Learning readiness: Ready   NUTRITION ASSESSMENT Follow up DM He notes he hasn't been able to make a lot of changes with his diet. Still feels very tired and not motivated to cook meals at home or go shopping. He tends to continue to eat fast and convenient foods. Has cut out [employer] but drinking chocolate milk instead. Has been drinking a lower fat milk.  FBS 250's.      Has a Libre. TAR 100%.  His estimated A1C was 9.6%, down from 13%. He is taking 30 units of Lantus a day but will be increasing to 50 units a day after seeing Dr. Fransico Him today. Complains of being tired, thirsty, craving sweets and no energy at all. Continues to live by himself and have poor hygiene.  Admits he may be depressed and that is effecting his desire to cook meals or be more outgoing and socialable. He will talk to his PCP  He is willing to work on eating more plant based foods from a garden and cut back on fast foods and processed foods.   Wt Readings from Last 3 Encounters:  05/15/22 219 lb (99.3 kg)  04/26/22 219 lb 9.6 oz (99.6 kg)  04/12/22 209 lb (94.8 kg)   Ht Readings from Last 3 Encounters:  05/15/22 5\' 8"  (1.727 m)  04/26/22 5\' 8"  (1.727 m)  04/12/22 5\' 8"  (1.727 m)   There is no height or weight on file to calculate BMI. @BMIFA @ Facility age limit for growth %iles is 20 years. Facility age limit for growth %iles is 20 years.    Clinical Medical Hx: DM Type 2, Obesity, Cirrhosis of liver, GERD, HTN, Hyperlipidemia, depression and anxiety Medications: Rybelsus, Glipizide, Lantus, Jardiance Labs:  Lab Results  Component Value Date   HGBA1C 13.1 (H) 04/03/2022       Latest Ref Rng & Units 05/07/2022    1:39 PM 04/07/2022    5:53 AM 04/06/2022    5:23 PM  CMP  Glucose 65 - 99 mg/dL 185  501  586   BUN 7 - 25  mg/dL 20  9  9    Creatinine 0.70 - 1.35 mg/dL 8.25  7.49  3.55   Sodium 135 - 146 mmol/L 137  135  136   Potassium 3.5 - 5.3 mmol/L 4.5  3.3  3.1   Chloride 98 - 110 mmol/L 94  93  92   CO2 20 - 32 mmol/L 32  31  30   Calcium 8.6 - 10.3 mg/dL 9.8  8.6  8.8   Total Protein 6.1 - 8.1 g/dL 8.0     Total Bilirubin 0.2 - 1.2 mg/dL 0.5     AST 10 - 35 U/L 22     ALT 9 - 46 U/L 25       Notable Signs/Symptoms: Chronic fatigue, neuropathy in feet, increased thirst, blurry vision  Lifestyle & Dietary Hx Lives by himself  Estimated daily fluid intake: 60 oz Supplements: See chart Sleep: a lot Stress / self-care: his health Current average weekly physical activity: ADL due to neuropathy  24-Hr Dietary Recall Eats 2-3 meals per day. Drinks water, propel and chocolate milk/milk. Cooks meals at home. Has improve his food choices recently  Estimated Energy Needs Calories: 1600 Carbohydrate: 180g Protein: 120g Fat: 44g   NUTRITION DIAGNOSIS  NB-1.1 Food  and nutrition-related knowledge deficit As related to Excessive carb, fat and salt intake.  As evidenced by DM Type 2, A1C 13.2%, Obesity.   NUTRITION INTERVENTION  Nutrition education (E-1) on the following topics:  Nutrition and Diabetes education provided on My Plate, CHO counting, meal planning, portion sizes, timing of meals, avoiding snacks between meals unless having a low blood sugar, target ranges for A1C and blood sugars, signs/symptoms and treatment of hyper/hypoglycemia, monitoring blood sugars, taking medications as prescribed, benefits of exercising 30 minutes per day and prevention of complications of DM.  Lifestyle Medicine  - Whole Food, Plant Predominant Nutrition is highly recommended: Eat Plenty of vegetables, Mushrooms, fruits, Legumes, Whole Grains, Nuts, seeds in lieu of processed meats, processed snacks/pastries red meat, poultry, eggs.    -It is better to avoid simple carbohydrates including: Cakes, Sweet  Desserts, Ice Cream, Soda (diet and regular), Sweet Tea, Candies, Chips, Cookies, Store Bought Juices, Alcohol in Excess of  1-2 drinks a day, Lemonade,  Artificial Sweeteners, Doughnuts, Coffee Creamers, "Sugar-free" Products, etc, etc.  This is not a complete list.....  Exercise: If you are able: 30 -60 minutes a day ,4 days a week, or 150 minutes a week.  The longer the better.  Combine stretch, strength, and aerobic activities.  If you were told in the past that you have high risk for cardiovascular diseases, you may seek evaluation by your heart doctor prior to initiating moderate to intense exercise programs.   Handouts Provided Include  Hyper/hypoglycemia signs and symptoms and treatment Lifestyle Medicine Target A1C's Heart Health with DM   Learning Style & Readiness for Change Teaching method utilized: Visual & Auditory  Demonstrated degree of understanding via: Teach Back  Barriers to learning/adherence to lifestyle change: None  Goals Established by Pt Goals  Eat three meals per day at times discussed. Focus on whole plant based foods from a garden Drink only water Cut out chocolate milk, propel and only drink water Get A1C down to 7% Avoid eating after 7 pm. Use CGM to guide food decisions based on blood sugars.   MONITORING & EVALUATION Dietary intake, weekly physical activity, and blood sugars in 1 month.  Next Steps  Patient is to work on consistent meal planning and whole plant based foods.Marland Kitchen

## 2022-06-13 NOTE — Progress Notes (Signed)
06/13/2022, 2:07 PM  Endocrinology follow-up note   Subjective:    Patient ID: Wesley Ho, male    DOB: 04-Mar-1956.  Wesley Ho is being seen in follow-up after he was seen in consultation for management of currently uncontrolled symptomatic diabetes requested by  Benita Stabile, MD.   Past Medical History:  Diagnosis Date   Anxiety    Arthritis    Bronchitis    Chronic diarrhea    Chronic edema of lower extremity 09/28/2010   COPD (chronic obstructive pulmonary disease)    Diabetes mellitus without complication    Diabetes mellitus, type II    DVT (deep venous thrombosis) 09/28/2010   Emphysema of lung    Fatigue    H/O Thrombocytopenia 09/28/2010   Hypertension    Left radial fracture    Multiple lung nodules 09/28/2010   Neuropathy     Past Surgical History:  Procedure Laterality Date   BIOPSY  05/29/2022   Procedure: BIOPSY;  Surgeon: Dolores Frame, MD;  Location: [facility];  Service: Gastroenterology;;   CATARACT EXTRACTION W/PHACO Right 01/17/2016   Procedure: CATARACT EXTRACTION PHACO AND INTRAOCULAR LENS PLACEMENT (IOC);  Surgeon: Jethro Bolus, MD;  Location: [facility];  Service: Ophthalmology;  Laterality: Right;  CDE: 4.34   CATARACT EXTRACTION W/PHACO Left 01/31/2016   Procedure: CATARACT EXTRACTION PHACO AND INTRAOCULAR LENS PLACEMENT (IOC);  Surgeon: Jethro Bolus, MD;  Location: [facility];  Service: Ophthalmology;  Laterality: Left;  CDE: 5.69   CHOLECYSTECTOMY     ESOPHAGEAL DILATION N/A 05/29/2022   Procedure: ESOPHAGEAL DILATION;  Surgeon: Dolores Frame, MD;  Location: [facility];  Service: Gastroenterology;  Laterality: N/A;   ESOPHAGOGASTRODUODENOSCOPY (EGD) WITH PROPOFOL N/A 05/29/2022   Procedure: ESOPHAGOGASTRODUODENOSCOPY (EGD) WITH PROPOFOL;  Surgeon: Dolores Frame, MD;  Location: [facility];  Service: Gastroenterology;   Laterality: N/A;  10:30 am, asa 3   MOUTH SURGERY      Social History   Socioeconomic History   Marital status: Divorced    Spouse name: Not on file   Number of children: 2   Years of education: Not on file   Highest education level: Not on file  Occupational History   Occupation: Retired  Tobacco Use   Smoking status: Former    Packs/day: 1.00    Years: 40.00    Additional pack years: 0.00    Total pack years: 40.00    Types: Cigarettes    Quit date: 01/11/2013    Years since quitting: 9.4   Smokeless tobacco: Never  Vaping Use   Vaping Use: Never used  Substance and Sexual Activity   Alcohol use: No    Comment: stopped drinking 2021   Drug use: No   Sexual activity: Not Currently    Birth control/protection: None  Other Topics Concern   Not on file  Social History Narrative   Not on file   Social Determinants of Health   Financial Resource Strain: Not on file  Food Insecurity: No Food Insecurity (04/03/2022)   Hunger Vital Sign    Worried About Running Out  of Food in the Last Year: Never true    Ran Out of Food in the Last Year: Never true  Transportation Needs: No Transportation Needs (04/03/2022)   PRAPARE - Administrator, Civil ServiceTransportation    Lack of Transportation (Medical): No    Lack of Transportation (Non-Medical): No  Physical Activity: Not on file  Stress: Not on file  Social Connections: Not on file    Family History  Problem Relation Age of Onset   Diabetes Mother    Clotting disorder Father    Alcohol abuse Maternal Uncle    Lung cancer Maternal Grandfather    Emphysema Paternal Grandfather     Outpatient Encounter Medications as of 06/13/2022  Medication Sig   acetaminophen (TYLENOL) 500 MG tablet Take 500-1,000 mg by mouth every 6 (six) hours as needed for mild pain.   ALPRAZolam (XANAX) 1 MG tablet Take 1 mg by mouth every 6 (six) hours as needed for anxiety.   atorvastatin (LIPITOR) 80 MG tablet Take 80 mg by mouth daily.   Blood Glucose Monitoring Suppl  DEVI 1 each by Does not apply route in the morning, at noon, and at bedtime. May substitute to any manufacturer covered by patient's insurance.   busPIRone (BUSPAR) 15 MG tablet Take 1 tablet (15 mg total) by mouth 3 (three) times daily. (Patient taking differently: Take 15 mg by mouth 2 (two) times daily.)   carboxymethylcellulose (REFRESH TEARS) 0.5 % SOLN Place 1 drop into both eyes 2 (two) times daily as needed (dry eyes).   chlorthalidone (HYGROTON) 25 MG tablet Take 25 mg by mouth every morning.   Continuous Blood Gluc Sensor (FREESTYLE LIBRE 3 SENSOR) MISC Place 1 sensor on the skin every 14 days. Use to check glucose continuously   Cyanocobalamin 2500 MCG TABS Take 2,500 mcg by mouth daily.   DULoxetine (CYMBALTA) 30 MG capsule Take 30 mg by mouth 2 (two) times daily.   finasteride (PROSCAR) 5 MG tablet Take 5 mg by mouth daily.   furosemide (LASIX) 20 MG tablet Take 20 mg by mouth daily.   glipiZIDE (GLUCOTROL XL) 10 MG 24 hr tablet Take 10 mg by mouth 2 (two) times daily.   insulin glargine (LANTUS SOLOSTAR) 100 UNIT/ML Solostar Pen Inject 50 Units into the skin at bedtime.   Insulin Pen Needle 32G X 4 MM MISC Insulin pen needle   JARDIANCE 25 MG TABS tablet Take 25 mg by mouth daily.   lactulose (CHRONULAC) 10 GM/15ML solution Take 30 mLs (20 g total) by mouth 2 (two) times daily.   levocetirizine (XYZAL) 5 MG tablet Take 5 mg by mouth every morning.   lidocaine-prilocaine (EMLA) cream Apply 1 Application topically daily as needed (pain).   metFORMIN (GLUCOPHAGE) 1000 MG tablet Take 1,000 mg by mouth 2 (two) times daily with a meal.   montelukast (SINGULAIR) 10 MG tablet Take 10 mg by mouth daily.   omega-3 acid ethyl esters (LOVAZA) 1 g capsule Take 2 g by mouth daily.   omeprazole (PRILOSEC) 40 MG capsule Take 1 capsule (40 mg total) by mouth 2 (two) times daily.   potassium chloride SA (KLOR-CON M) 20 MEQ tablet Take 1 tablet (20 mEq total) by mouth daily for 5 days. (Patient not  taking: Reported on 05/23/2022)   pregabalin (LYRICA) 200 MG capsule Take 200 mg by mouth 2 (two) times daily.   propranolol (INDERAL) 60 MG tablet Take 60 mg by mouth 2 (two) times daily.   Semaglutide (RYBELSUS) 3 MG TABS TAKE 1 TABLET (3  MG) BY ORAL ROUTE ONCE DAILY FOR 30 DAYS, THEN START 7MG  TABLET DAILY   sucralfate (CARAFATE) 1 GM/10ML suspension Take 10 mLs (1 g total) by mouth 4 (four) times daily -  with meals and at bedtime.   [DISCONTINUED] insulin glargine (LANTUS SOLOSTAR) 100 UNIT/ML Solostar Pen Inject 30 Units into the skin at bedtime.   No facility-administered encounter medications on file as of 06/13/2022.    ALLERGIES: No Known Allergies  VACCINATION STATUS: Immunization History  Administered Date(s) Administered   Fluad Quad(high Dose 65+) 12/21/2019   Influenza-Unspecified 11/03/2017   Tdap 09/02/2019    Diabetes He presents for his follow-up diabetic visit. He has type 2 diabetes mellitus. Onset time: Patient reports that he was not aware of having diabetes and did he went to the hospital with severe hyperglycemia leading to diabetes ketoacidosis recently.  He did have A1c of 6.1% consistent with prediabetes in 2016. His disease course has been improving. There are no hypoglycemic associated symptoms. Pertinent negatives for hypoglycemia include no confusion, headaches, pallor or seizures. Associated symptoms include polydipsia and polyuria. Pertinent negatives for diabetes include no chest pain, no fatigue, no polyphagia and no weakness. There are no hypoglycemic complications. Symptoms are worsening. Risk factors for coronary artery disease include diabetes mellitus, dyslipidemia, hypertension, male sex, tobacco exposure, sedentary lifestyle, family history and obesity. His weight is fluctuating minimally. He is following a generally unhealthy diet. When asked about meal planning, he reported none. He has not had a previous visit with a dietitian. He never participates  in exercise. His home blood glucose trend is decreasing steadily. His breakfast blood glucose range is generally >200 mg/dl. His lunch blood glucose range is generally >200 mg/dl. His dinner blood glucose range is generally >200 mg/dl. His bedtime blood glucose range is generally >200 mg/dl. His overall blood glucose range is >200 mg/dl. (He uses his CGM on his phone.  His average blood glucose is 278 for the last 14 days, improving.  His AGP report shows 31% time range, 22% level 1 hyperglycemia, 47% level 2 hyperglycemia.  He did not document or report hypoglycemia.    His  last A1c was 13.1% during his recent hospitalization for diabetes ketoacidosis.) An ACE inhibitor/angiotensin II receptor blocker is being taken.  Hypertension This is a chronic problem. The current episode started more than 1 year ago. The problem is controlled. Pertinent negatives include no chest pain, headaches, neck pain, palpitations or shortness of breath. Risk factors for coronary artery disease include obesity, male gender, dyslipidemia, diabetes mellitus, smoking/tobacco exposure and sedentary lifestyle. Past treatments include beta blockers and diuretics. Compliance problems: He has a documented history of medical noncompliance.   Hyperlipidemia This is a chronic problem. Exacerbating diseases include diabetes and obesity. Pertinent negatives include no chest pain, myalgias or shortness of breath. Current antihyperlipidemic treatment includes statins. Risk factors for coronary artery disease include dyslipidemia, diabetes mellitus, hypertension, male sex, a sedentary lifestyle, family history and obesity.       Objective:       06/13/2022   11:07 AM 06/13/2022   10:00 AM 05/29/2022   11:15 AM  Vitals with BMI  Height 5\' 8"  5\' 8"    Weight 218 lbs 218 lbs   BMI 33.15 33.15   Systolic 136  136  Diastolic 84  67  Pulse 84  88    BP 136/84   Pulse 84   Ht 5\' 8"  (1.727 m)   Wt 218 lb (98.9 kg)   BMI 33.15 kg/m  Wt Readings from Last 3 Encounters:  06/13/22 218 lb (98.9 kg)  06/13/22 218 lb (98.9 kg)  05/15/22 219 lb (99.3 kg)    Patient has reluctant affect.     CMP ( most recent) CMP     Component Value Date/Time   NA 137 05/07/2022 1339   K 4.5 05/07/2022 1339   CL 94 (L) 05/07/2022 1339   CO2 32 05/07/2022 1339   GLUCOSE 226 (H) 05/07/2022 1339   BUN 20 05/07/2022 1339   CREATININE 0.84 05/07/2022 1339   CALCIUM 9.8 05/07/2022 1339   PROT 8.0 05/07/2022 1339   ALBUMIN 2.9 (L) 04/04/2022 1214   AST 22 05/07/2022 1339   ALT 25 05/07/2022 1339   ALKPHOS 61 04/04/2022 1214   BILITOT 0.5 05/07/2022 1339   GFRNONAA >60 04/07/2022 0553   GFRAA >60 09/02/2019 1656     Diabetic Labs (most recent): Lab Results  Component Value Date   HGBA1C 13.1 (H) 04/03/2022   HGBA1C 6.1 (H) 07/08/2014        Lab Results  Component Value Date   TSH 0.904 10/30/2016   TSH 2.280 07/07/2014      Assessment & Plan:   1. Type 2 diabetes mellitus with hyperglycemia, with long-term current use of insulin (HCC)  - VEGAS FRITZE has currently uncontrolled symptomatic type 2 DM since  67 years of age.  He uses his CGM on his phone.  His average blood glucose is 278 for the last 14 days, improving.  His AGP report shows 31% time range, 22% level 1 hyperglycemia, 47% level 2 hyperglycemia.  He did not document or report hypoglycemia.    His  last A1c was 13.1% during his recent hospitalization for diabetes ketoacidosis.   Recent labs reviewed. - I had a long discussion with him about the possible risk factors and  the pathology behind its diabetes and its complications. -his diabetes is complicated by obesity/sedentary life and he remains at a high risk for more acute and chronic complications which include CAD, CVA, CKD, retinopathy, and neuropathy. These are all discussed in detail with him.  - I discussed all available options of managing his diabetes including de-escalation of  medications. I have counseled him on Food as Medicine by adopting a Whole Food , Plant Predominant  ( WFPP) nutrition as recommended by [employer] of Lifestyle Medicine. Patient is encouraged to switch to  unprocessed or minimally processed  complex starch, adequate protein intake (mainly plant source), minimal liquid fat, plenty of fruits, and vegetables. -  he is advised to stick to a routine mealtimes to eat 3 complete meals a day and snack only when necessary ( to snack only to correct hypoglycemia BG <70 day time or <100 at night).   - he acknowledges that there is a room for improvement in his food and drink choices. - Further Specific Suggestion is made for him to avoid simple carbohydrates  from his diet including Cakes, Sweet Desserts, Ice Cream, Soda (diet and regular), Sweet Tea, Candies, Chips, Cookies, Store Bought Juices, Alcohol ,  Artificial Sweeteners,  Coffee Creamer, and "Sugar-free" Products. This will help patient to have more stable blood glucose profile and potentially avoid unintended weight gain.  - he will be scheduled with Norm Salt, RDN, CDE for individualized diabetes education.  - I have approached him with the following individualized plan to manage  his diabetes and patient agrees:   -Admittedly, he consumes large amount of sweetened milk and beverages instead of  proper meals.  In light of his current and prevailing hyperglycemic burden, he will need higher dose of insulin.    I discussed and increase his Lantus to 50 units nightly, advised to use his CGM continuously.   No change was made on his other medications including Jardiance 25 mg p.o. daily at breakfast, glipizide 5 mg p.o. daily at breakfast, 500 mg of metformin twice daily.  He is also on Rybelsus 3 mg p.o. daily.  He is advised to continue, he will benefit from simplification of his medications next visit. -  He does not cook at home, exclusively eating out.  - he will be considered for incretin  therapy as appropriate next visit.  - Specific targets for  A1c;  LDL, HDL,  and Triglycerides were discussed with the patient.  2) Blood Pressure /Hypertension: His blood pressure is controlled to target.  he is advised to continue his current medications including chlorthalidone 25 mg p.o. daily, Lasix as needed.   3) Lipids/Hyperlipidemia: He has no recent lipid panel to review.  He is advised to continue atorvastatin 40 mg p.o. nightly.    Side effects and precautions discussed with him.  4)  Weight/Diet:  Body mass index is 33.15 kg/m.  -   clearly complicating his diabetes care.   he is  a candidate for weight loss. I discussed with him the fact that loss of 5 - 10% of his  current body weight will have the most impact on his diabetes management.  The above detailed  AC[facility] recommendations for nutrition, exercise, sleep, social life, avoidance of risky substances, the need for restorative sleep   information will also detailed on discharge instructions.  5) Chronic Care/Health Maintenance:  -he  is on Statin medications and  is encouraged to initiate and continue to follow up with Ophthalmology, Dentist,  Podiatrist at least yearly or according to recommendations, and advised to   stay away from smoking. I have recommended yearly flu vaccine and pneumonia vaccine at least every 5 years; moderate intensity exercise for up to 150 minutes weekly; and  sleep for 7- 9 hours a day.  - he is  advised to maintain close follow up with Benita Stabile, MD for primary care needs, as well as his other providers for optimal and coordinated care.    I spent  21  minutes in the care of the patient today including review of labs from CMP, Lipids, Thyroid Function, Hematology (current and previous including abstractions from other facilities); face-to-face time discussing  his blood glucose readings/logs, discussing hypoglycemia and hyperglycemia episodes and symptoms, medications doses, his options of short and  long term treatment based on the latest standards of care / guidelines;  discussion about incorporating lifestyle medicine;  and documenting the encounter. Risk reduction counseling performed per USPSTF guidelines to reduce  obesity and cardiovascular risk factors.     Please refer to Patient Instructions for Blood Glucose Monitoring and Insulin/Medications Dosing Guide"  in media tab for additional information. Please  also refer to " Patient Self Inventory" in the Media  tab for reviewed elements of pertinent patient history.  Wesley Ho participated in the discussions, expressed understanding, and voiced agreement with the above plans.  All questions were answered to his satisfaction. he is encouraged to contact clinic should he have any questions or concerns prior to his return visit.    Follow up plan: - Return in about 9 weeks (around 08/15/2022) for Bring Meter/CGM Device/Logs- A1c in Office.  Marquis Lunch, MD [facility] [address] [place], [place] [address] Phone: [phone]  Fax: [phone]    06/13/2022, 2:07 PM  This note was partially dictated with voice recognition software. Similar sounding words can be transcribed inadequately or may not  be corrected upon review.

## 2022-06-13 NOTE — Patient Instructions (Signed)
Goals  Eat three meals per day, eating more foods from a garden. Use Healthy Choice meals instead of eating out fast foods Drink 4 bottles of water per day Cut down on chocolate milk. Talk to PCP about referring to a therapist to help with possible depression. Get A1C down to 7%

## 2022-06-13 NOTE — Patient Instructions (Signed)

## 2022-06-18 DIAGNOSIS — Z23 Encounter for immunization: Secondary | ICD-10-CM | POA: Diagnosis not present

## 2022-06-20 ENCOUNTER — Telehealth: Payer: Self-pay

## 2022-06-20 ENCOUNTER — Ambulatory Visit: Payer: PPO | Admitting: Nutrition

## 2022-06-20 NOTE — Telephone Encounter (Signed)
-----   Message from Roma Kayser, MD sent at 06/20/2022  4:36 PM EDT ----- Ander Slade, Would you advise him to increase his Lantus to 60 units qhs? Thanks.

## 2022-06-20 NOTE — Telephone Encounter (Signed)
Left a message requesting pt return call to the office. 

## 2022-06-21 DIAGNOSIS — E119 Type 2 diabetes mellitus without complications: Secondary | ICD-10-CM | POA: Diagnosis not present

## 2022-06-21 NOTE — Telephone Encounter (Signed)
Spoke with pt, advised pt to increase lantus to 60 units qhs per Dr.Nida's orders. Pt voiced understanding.

## 2022-06-21 NOTE — Telephone Encounter (Signed)
Left a message requesting pt return call to the office. 

## 2022-06-25 DIAGNOSIS — L97422 Non-pressure chronic ulcer of left heel and midfoot with fat layer exposed: Secondary | ICD-10-CM | POA: Diagnosis not present

## 2022-06-25 DIAGNOSIS — E1142 Type 2 diabetes mellitus with diabetic polyneuropathy: Secondary | ICD-10-CM | POA: Diagnosis not present

## 2022-06-25 DIAGNOSIS — B351 Tinea unguium: Secondary | ICD-10-CM | POA: Diagnosis not present

## 2022-06-28 ENCOUNTER — Encounter (INDEPENDENT_AMBULATORY_CARE_PROVIDER_SITE_OTHER): Payer: Self-pay | Admitting: *Deleted

## 2022-07-03 ENCOUNTER — Other Ambulatory Visit: Payer: Self-pay

## 2022-07-03 DIAGNOSIS — E1165 Type 2 diabetes mellitus with hyperglycemia: Secondary | ICD-10-CM

## 2022-07-03 MED ORDER — LANTUS SOLOSTAR 100 UNIT/ML ~~LOC~~ SOPN: 60.0000 [IU] | PEN_INJECTOR | Freq: Every day | SUBCUTANEOUS | 1 refills | Status: DC

## 2022-07-04 ENCOUNTER — Other Ambulatory Visit: Payer: Self-pay | Admitting: *Deleted

## 2022-07-04 ENCOUNTER — Telehealth: Payer: Self-pay | Admitting: "Endocrinology

## 2022-07-04 DIAGNOSIS — E1165 Type 2 diabetes mellitus with hyperglycemia: Secondary | ICD-10-CM

## 2022-07-04 MED ORDER — FREESTYLE LIBRE 3 SENSOR MISC: 2 refills | Status: DC

## 2022-07-04 NOTE — Telephone Encounter (Signed)
The prescription has been sent to [facility].

## 2022-07-04 NOTE — Telephone Encounter (Signed)
A prescription has been sent to [facility].

## 2022-07-04 NOTE — Telephone Encounter (Signed)
Patient said he needs a refill on his [employer] to [facility] so they can deliver it.

## 2022-07-05 DIAGNOSIS — Z7984 Long term (current) use of oral hypoglycemic drugs: Secondary | ICD-10-CM | POA: Diagnosis not present

## 2022-07-05 DIAGNOSIS — E119 Type 2 diabetes mellitus without complications: Secondary | ICD-10-CM | POA: Diagnosis not present

## 2022-07-05 DIAGNOSIS — Z794 Long term (current) use of insulin: Secondary | ICD-10-CM | POA: Diagnosis not present

## 2022-07-09 ENCOUNTER — Telehealth: Payer: Self-pay | Admitting: "Endocrinology

## 2022-07-09 DIAGNOSIS — E785 Hyperlipidemia, unspecified: Secondary | ICD-10-CM | POA: Diagnosis not present

## 2022-07-09 DIAGNOSIS — Z794 Long term (current) use of insulin: Secondary | ICD-10-CM

## 2022-07-09 DIAGNOSIS — E1100 Type 2 diabetes mellitus with hyperosmolarity without nonketotic hyperglycemic-hyperosmolar coma (NKHHC): Secondary | ICD-10-CM | POA: Diagnosis not present

## 2022-07-09 MED ORDER — INSULIN PEN NEEDLE 32G X 4 MM MISC: 0 refills | Status: DC

## 2022-07-09 NOTE — Telephone Encounter (Signed)
Pt is requesting a refill on his Pen Needles to [facility]

## 2022-07-09 NOTE — Telephone Encounter (Signed)
Rx refill for pen needles sent to [facility].

## 2022-07-13 DIAGNOSIS — E1310 Other specified diabetes mellitus with ketoacidosis without coma: Secondary | ICD-10-CM | POA: Diagnosis not present

## 2022-07-13 DIAGNOSIS — E1110 Type 2 diabetes mellitus with ketoacidosis without coma: Secondary | ICD-10-CM | POA: Diagnosis not present

## 2022-07-13 DIAGNOSIS — F331 Major depressive disorder, recurrent, moderate: Secondary | ICD-10-CM | POA: Diagnosis not present

## 2022-07-13 DIAGNOSIS — R600 Localized edema: Secondary | ICD-10-CM | POA: Diagnosis not present

## 2022-07-13 DIAGNOSIS — Z0000 Encounter for general adult medical examination without abnormal findings: Secondary | ICD-10-CM | POA: Diagnosis not present

## 2022-07-13 DIAGNOSIS — J309 Allergic rhinitis, unspecified: Secondary | ICD-10-CM | POA: Diagnosis not present

## 2022-07-13 DIAGNOSIS — K222 Esophageal obstruction: Secondary | ICD-10-CM | POA: Diagnosis not present

## 2022-07-13 DIAGNOSIS — E1140 Type 2 diabetes mellitus with diabetic neuropathy, unspecified: Secondary | ICD-10-CM | POA: Diagnosis not present

## 2022-07-13 DIAGNOSIS — Z0001 Encounter for general adult medical examination with abnormal findings: Secondary | ICD-10-CM | POA: Diagnosis not present

## 2022-07-13 DIAGNOSIS — E875 Hyperkalemia: Secondary | ICD-10-CM | POA: Diagnosis not present

## 2022-07-13 DIAGNOSIS — F411 Generalized anxiety disorder: Secondary | ICD-10-CM | POA: Diagnosis not present

## 2022-07-13 DIAGNOSIS — E1100 Type 2 diabetes mellitus with hyperosmolarity without nonketotic hyperglycemic-hyperosmolar coma (NKHHC): Secondary | ICD-10-CM | POA: Diagnosis not present

## 2022-07-18 DIAGNOSIS — E119 Type 2 diabetes mellitus without complications: Secondary | ICD-10-CM | POA: Diagnosis not present

## 2022-07-18 DIAGNOSIS — Z794 Long term (current) use of insulin: Secondary | ICD-10-CM | POA: Diagnosis not present

## 2022-08-01 DIAGNOSIS — Z4689 Encounter for fitting and adjustment of other specified devices: Secondary | ICD-10-CM | POA: Diagnosis not present

## 2022-08-13 ENCOUNTER — Telehealth: Payer: Self-pay | Admitting: Pharmacist

## 2022-08-13 NOTE — Progress Notes (Signed)
[employer] Customer service manager [name])         [facility] Quality Pharmacy Team     08/13/2022  Wesley Ho 17-Feb-1956 [id number]   Diabetes Medication Adherence and Clinical Review  I am a [facility] clinical pharmacist that reviews patients for medication related quality initiatives. Patient has been identified by their insurance plan as qualifying for the following medication related quality measure(s):    HbA1c >9%     Outreach:  Successful telephone call with patient.  HIPAA identifiers verified.    Subjective:  Patient was on the GSD -HTA report because his HgA1c was 14.6 at the time of the report. It has come down considerably since the time the report was published to 9.3.  Patient also has other medical conditions including but not limited to:  hyperlipidemia, hypertension, type 2 diabetes, anxiety, neuropathy, hyperkalemia, hyperlipidemia, and obesity.   Objective: The ASCVD Risk score (Arnett DK, et al., 2019) failed to calculate for the following reasons:   Cannot find a previous HDL lab   Cannot find a previous total cholesterol lab  Lab Results  Component Value Date   CREATININE 0.84 05/07/2022   CREATININE 0.78 04/07/2022   CREATININE 0.80 04/06/2022    Lab Results  Component Value Date   HGBA1C 13.1 (H) 04/03/2022     Lipid Panel  No results found for: "CHOL", "TRIG", "HDL", "CHOLHDL", "VLDL", "LDLCALC", "LDLDIRECT"  BP Readings from Last 3 Encounters:  06/13/22 136/84  05/29/22 136/67  04/26/22 138/85    No Known Allergies  Medications Reviewed Today     Reviewed by Mare Loan, RD (Registered Dietitian) on 06/13/22 at 1350  Med List Status: <None>   Medication Order Taking? Sig Documenting Provider Last Dose Status Informant  acetaminophen (TYLENOL) 500 MG tablet [id number] Yes Take 500-1,000 mg by mouth every 6 (six) hours as needed for mild pain. [provider] Taking Active Self  ALPRAZolam Prudy Feeler) 1 MG tablet [id number] Yes Take 1 mg by  mouth every 6 (six) hours as needed for anxiety. [provider] Taking Active Self  atorvastatin (LIPITOR) 80 MG tablet [id number] Yes Take 80 mg by mouth daily. [provider] Taking Active Self  Blood Glucose Monitoring Suppl DEVI [id number]  1 each by Does not apply route in the morning, at noon, and at bedtime. May substitute to any manufacturer covered by patient's insurance. Briant Cedar, MD  Active Self  busPIRone (BUSPAR) 15 MG tablet [id number]  Take 1 tablet (15 mg total) by mouth 3 (three) times daily.  Patient taking differently: Take 15 mg by mouth 2 (two) times daily.   Neysa Hotter, MD  Active Self  carboxymethylcellulose (REFRESH TEARS) 0.5 % SOLN [id number]  Place 1 drop into both eyes 2 (two) times daily as needed (dry eyes). [provider]  Active Self  chlorthalidone (HYGROTON) 25 MG tablet [id number]  Take 25 mg by mouth every morning. [provider]  Active Self  Continuous Blood Gluc Sensor (FREESTYLE LIBRE 3 SENSOR) [place] [id number]  Place 1 sensor on the skin every 14 days. Use to check glucose continuously Briant Cedar, MD  Active Self  Cyanocobalamin 2500 MCG TABS [id number]  Take 2,500 mcg by mouth daily. [provider]  Active Self  DULoxetine (CYMBALTA) 30 MG capsule [id number]  Take 30 mg by mouth 2 (two) times daily. [provider]  Active Self  finasteride (PROSCAR) 5 MG tablet [id number]  Take 5 mg by mouth daily. [provider]  Active Self  furosemide (LASIX) 20 MG tablet [id number]  Take 20 mg by mouth daily. Elfredia Nevins, MD  Active Self  glipiZIDE (GLUCOTROL XL) 10 MG 24 hr tablet [id number] Yes Take 10 mg by mouth 2 (two) times daily. [provider] Taking Active Self  insulin glargine (LANTUS SOLOSTAR) 100 UNIT/ML Solostar Pen [id number]  Inject 50 Units into the skin at bedtime. Roma Kayser, MD  Active   Insulin Pen Needle 32G X 4 MM MISC [id number]  Insulin pen  needle Briant Cedar, MD  Active Self  JARDIANCE 25 MG TABS tablet [id number] Yes Take 25 mg by mouth daily. [provider] Taking Active Self           Med Note Mayford Knife, DAWN S   Tue Apr 03, 2022  3:33 PM) Pt states he get this medication from the manufacturer.   lactulose (CHRONULAC) 10 GM/15ML solution [id number] Yes Take 30 mLs (20 g total) by mouth 2 (two) times daily. Letta Median, PA-C Taking Active Self  levocetirizine (XYZAL) 5 MG tablet [id number] Yes Take 5 mg by mouth every morning. [provider] Taking Active Self  lidocaine-prilocaine (EMLA) cream [id number] Yes Apply 1 Application topically daily as needed (pain). Elder Negus, NP Taking Active Self  metFORMIN (GLUCOPHAGE) 1000 MG tablet [id number] Yes Take 1,000 mg by mouth 2 (two) times daily with a meal. [provider] Taking Active Self  montelukast (SINGULAIR) 10 MG tablet [id number] Yes Take 10 mg by mouth daily. [provider] Taking Active Self  omega-3 acid ethyl esters (LOVAZA) 1 g capsule [id number] Yes Take 2 g by mouth daily. [provider] Taking Active Self  omeprazole (PRILOSEC) 40 MG capsule [id number] Yes Take 1 capsule (40 mg total) by mouth 2 (two) times daily. Dolores Frame, MD Taking Active   potassium chloride SA (KLOR-CON M) 20 MEQ tablet [id number]  Take 1 tablet (20 mEq total) by mouth daily for 5 days.  Patient not taking: Reported on 05/23/2022   Briant Cedar, MD  Expired 04/26/22 2359   pregabalin (LYRICA) 200 MG capsule [id number] Yes Take 200 mg by mouth 2 (two) times daily. [provider] Taking Active Self  propranolol (INDERAL) 60 MG tablet [id number] Yes Take 60 mg by mouth 2 (two) times daily. [provider] Taking Active Self  Semaglutide (RYBELSUS) 3 MG TABS [id number] Yes TAKE 1 TABLET (3 MG) BY ORAL ROUTE ONCE DAILY FOR 30 DAYS, THEN START 7MG  TABLET DAILY [provider] Taking Active Self   sucralfate (CARAFATE) 1 GM/10ML suspension [id number] Yes Take 10 mLs (1 g total) by mouth 4 (four) times daily -  with meals and at bedtime. Marguerita Merles, Reuel Boom, MD Taking Active               Patient with good understanding of regimen and good understanding of indications.    Diabetes:  Uncontrolled on Rybelsus, Lantus, Metformin, and Jardiance.  Provided education to patient about basic DM disease process Reviewed medications with patient and discussed importance of medication adherence Counseled on importance of regular laboratory monitoring as prescribed Advised patient, providing education and rationale, to check cbg daily and record, calling provider for findings outside established parameters  Patient's A1c has come down considerably. Next Appt 11/15/22    Medication Assistance Findings:  Medication assistance needs identified: Patient is already receiving Rybelsus and Jardiance through Patient Assistance Programs. Plan: Will follow up after next appointment and lab draw.    [name], PharmD, BCACP [facility]  Clinical Pharmacist [phone]

## 2022-08-14 DIAGNOSIS — Z7984 Long term (current) use of oral hypoglycemic drugs: Secondary | ICD-10-CM | POA: Diagnosis not present

## 2022-08-14 DIAGNOSIS — Z7182 Exercise counseling: Secondary | ICD-10-CM | POA: Diagnosis not present

## 2022-08-14 DIAGNOSIS — Z794 Long term (current) use of insulin: Secondary | ICD-10-CM | POA: Diagnosis not present

## 2022-08-14 DIAGNOSIS — E119 Type 2 diabetes mellitus without complications: Secondary | ICD-10-CM | POA: Diagnosis not present

## 2022-08-14 DIAGNOSIS — Z713 Dietary counseling and surveillance: Secondary | ICD-10-CM | POA: Diagnosis not present

## 2022-08-17 ENCOUNTER — Encounter (HOSPITAL_COMMUNITY): Payer: Self-pay | Admitting: Gastroenterology

## 2022-08-20 ENCOUNTER — Encounter: Payer: Self-pay | Admitting: "Endocrinology

## 2022-08-20 ENCOUNTER — Encounter: Payer: Self-pay | Admitting: Nutrition

## 2022-08-20 ENCOUNTER — Ambulatory Visit (INDEPENDENT_AMBULATORY_CARE_PROVIDER_SITE_OTHER): Payer: PPO | Admitting: "Endocrinology

## 2022-08-20 ENCOUNTER — Encounter: Payer: PPO | Attending: Internal Medicine | Admitting: Nutrition

## 2022-08-20 VITALS — BP 120/76 | HR 100 | Ht 68.0 in | Wt 227.2 lb

## 2022-08-20 DIAGNOSIS — E1165 Type 2 diabetes mellitus with hyperglycemia: Secondary | ICD-10-CM | POA: Insufficient documentation

## 2022-08-20 DIAGNOSIS — Z794 Long term (current) use of insulin: Secondary | ICD-10-CM | POA: Insufficient documentation

## 2022-08-20 DIAGNOSIS — I10 Essential (primary) hypertension: Secondary | ICD-10-CM | POA: Insufficient documentation

## 2022-08-20 DIAGNOSIS — E6609 Other obesity due to excess calories: Secondary | ICD-10-CM

## 2022-08-20 DIAGNOSIS — Z6834 Body mass index (BMI) 34.0-34.9, adult: Secondary | ICD-10-CM | POA: Diagnosis not present

## 2022-08-20 DIAGNOSIS — E782 Mixed hyperlipidemia: Secondary | ICD-10-CM | POA: Diagnosis not present

## 2022-08-20 LAB — POCT GLYCOSYLATED HEMOGLOBIN (HGB A1C): HbA1c, POC (controlled diabetic range): 8.4 % — AB (ref 0.0–7.0)

## 2022-08-20 MED ORDER — LANTUS SOLOSTAR 100 UNIT/ML ~~LOC~~ SOPN: 70.0000 [IU] | PEN_INJECTOR | Freq: Every day | SUBCUTANEOUS | 1 refills | Status: DC

## 2022-08-20 NOTE — Progress Notes (Signed)
08/20/2022, 6:42 PM  Endocrinology follow-up note   Subjective:    Patient ID: Wesley Ho, male    DOB: 1955-12-01.  Wesley Ho is being seen in follow-up after he was seen in consultation for management of currently uncontrolled symptomatic diabetes requested by  Benita Stabile, MD.   Past Medical History:  Diagnosis Date   Anxiety    Arthritis    Bronchitis    Chronic diarrhea    Chronic edema of lower extremity 09/28/2010   COPD (chronic obstructive pulmonary disease) (HCC)    Diabetes mellitus without complication (HCC)    Diabetes mellitus, type II (HCC)    DVT (deep venous thrombosis) (HCC) 09/28/2010   Emphysema of lung (HCC)    Fatigue    H/O Thrombocytopenia 09/28/2010   Hypertension    Left radial fracture    Multiple lung nodules 09/28/2010   Neuropathy     Past Surgical History:  Procedure Laterality Date   BIOPSY  05/29/2022   Procedure: BIOPSY;  Surgeon: Dolores Frame, MD;  Location: [facility];  Service: Gastroenterology;;   CATARACT EXTRACTION W/PHACO Right 01/17/2016   Procedure: CATARACT EXTRACTION PHACO AND INTRAOCULAR LENS PLACEMENT (IOC);  Surgeon: Jethro Bolus, MD;  Location: [facility];  Service: Ophthalmology;  Laterality: Right;  CDE: 4.34   CATARACT EXTRACTION W/PHACO Left 01/31/2016   Procedure: CATARACT EXTRACTION PHACO AND INTRAOCULAR LENS PLACEMENT (IOC);  Surgeon: Jethro Bolus, MD;  Location: [facility];  Service: Ophthalmology;  Laterality: Left;  CDE: 5.69   CHOLECYSTECTOMY     ESOPHAGEAL DILATION N/A 05/29/2022   Procedure: ESOPHAGEAL DILATION;  Surgeon: Dolores Frame, MD;  Location: [facility];  Service: Gastroenterology;  Laterality: N/A;   ESOPHAGOGASTRODUODENOSCOPY (EGD) WITH PROPOFOL N/A 05/29/2022   Procedure: ESOPHAGOGASTRODUODENOSCOPY (EGD) WITH PROPOFOL;  Surgeon: Dolores Frame, MD;  Location: [facility];   Service: Gastroenterology;  Laterality: N/A;  10:30 am, asa 3   MOUTH SURGERY      Social History   Socioeconomic History   Marital status: Divorced    Spouse name: Not on file   Number of children: 2   Years of education: Not on file   Highest education level: Not on file  Occupational History   Occupation: Retired  Tobacco Use   Smoking status: Former    Packs/day: 1.00    Years: 40.00    Additional pack years: 0.00    Total pack years: 40.00    Types: Cigarettes    Quit date: 01/11/2013    Years since quitting: 9.6   Smokeless tobacco: Never  Vaping Use   Vaping Use: Never used  Substance and Sexual Activity   Alcohol use: No    Comment: stopped drinking 2021   Drug use: No   Sexual activity: Not Currently    Birth control/protection: None  Other Topics Concern   Not on file  Social History Narrative   Not on file   Social Determinants of Health   Financial Resource Strain: Not on file  Food Insecurity: No Food Insecurity (04/03/2022)   Hunger Vital Sign  Worried About Programme researcher, broadcasting/film/video in the Last Year: Never true    Ran Out of Food in the Last Year: Never true  Transportation Needs: No Transportation Needs (04/03/2022)   PRAPARE - Administrator, Civil Service (Medical): No    Lack of Transportation (Non-Medical): No  Physical Activity: Not on file  Stress: Not on file  Social Connections: Not on file    Family History  Problem Relation Age of Onset   Diabetes Mother    Clotting disorder Father    Alcohol abuse Maternal Uncle    Lung cancer Maternal Grandfather    Emphysema Paternal Grandfather     Outpatient Encounter Medications as of 08/20/2022  Medication Sig   acetaminophen (TYLENOL) 500 MG tablet Take 500-1,000 mg by mouth every 6 (six) hours as needed for mild pain.   ALPRAZolam (XANAX) 1 MG tablet Take 1 mg by mouth every 6 (six) hours as needed for anxiety.   atorvastatin (LIPITOR) 80 MG tablet Take 80 mg by mouth daily.    Blood Glucose Monitoring Suppl DEVI 1 each by Does not apply route in the morning, at noon, and at bedtime. May substitute to any manufacturer covered by patient's insurance.   busPIRone (BUSPAR) 15 MG tablet Take 1 tablet (15 mg total) by mouth 3 (three) times daily. (Patient taking differently: Take 15 mg by mouth 2 (two) times daily.)   carboxymethylcellulose (REFRESH TEARS) 0.5 % SOLN Place 1 drop into both eyes 2 (two) times daily as needed (dry eyes).   chlorthalidone (HYGROTON) 25 MG tablet Take 25 mg by mouth every morning.   Continuous Glucose Sensor (FREESTYLE LIBRE 3 SENSOR) MISC Place 1 sensor on the skin every 14 days. Use to check glucose continuously   Cyanocobalamin 2500 MCG TABS Take 2,500 mcg by mouth daily.   DULoxetine (CYMBALTA) 30 MG capsule Take 30 mg by mouth 2 (two) times daily.   finasteride (PROSCAR) 5 MG tablet Take 5 mg by mouth daily.   furosemide (LASIX) 20 MG tablet Take 20 mg by mouth daily.   insulin glargine (LANTUS SOLOSTAR) 100 UNIT/ML Solostar Pen Inject 70 Units into the skin at bedtime.   Insulin Pen Needle 32G X 4 MM MISC Insulin pen needle   JARDIANCE 25 MG TABS tablet Take 25 mg by mouth daily.   lactulose (CHRONULAC) 10 GM/15ML solution Take 30 mLs (20 g total) by mouth 2 (two) times daily.   levocetirizine (XYZAL) 5 MG tablet Take 5 mg by mouth every morning.   lidocaine-prilocaine (EMLA) cream Apply 1 Application topically daily as needed (pain).   metFORMIN (GLUCOPHAGE) 1000 MG tablet Take 1,000 mg by mouth 2 (two) times daily with a meal.   montelukast (SINGULAIR) 10 MG tablet Take 10 mg by mouth daily.   omega-3 acid ethyl esters (LOVAZA) 1 g capsule Take 2 g by mouth daily.   omeprazole (PRILOSEC) 40 MG capsule Take 1 capsule (40 mg total) by mouth 2 (two) times daily.   potassium chloride SA (KLOR-CON M) 20 MEQ tablet Take 1 tablet (20 mEq total) by mouth daily for 5 days. (Patient not taking: Reported on 05/23/2022)   pregabalin (LYRICA) 200 MG  capsule Take 200 mg by mouth 2 (two) times daily.   propranolol (INDERAL) 60 MG tablet Take 60 mg by mouth 2 (two) times daily.   Semaglutide (RYBELSUS) 3 MG TABS TAKE 1 TABLET (3 MG) BY ORAL ROUTE ONCE DAILY FOR 30 DAYS, THEN START 7MG  TABLET DAILY  sucralfate (CARAFATE) 1 GM/10ML suspension Take 10 mLs (1 g total) by mouth 4 (four) times daily -  with meals and at bedtime. (Patient not taking: Reported on 08/13/2022)   [DISCONTINUED] glipiZIDE (GLUCOTROL XL) 10 MG 24 hr tablet Take 10 mg by mouth 2 (two) times daily.   [DISCONTINUED] insulin glargine (LANTUS SOLOSTAR) 100 UNIT/ML Solostar Pen Inject 60 Units into the skin at bedtime.   No facility-administered encounter medications on file as of 08/20/2022.    ALLERGIES: No Known Allergies  VACCINATION STATUS: Immunization History  Administered Date(s) Administered   Fluad Quad(high Dose 65+) 12/21/2019   Influenza-Unspecified 11/03/2017   Tdap 09/02/2019    Diabetes He presents for his follow-up diabetic visit. He has type 2 diabetes mellitus. Onset time: Patient reports that he was not aware of having diabetes and did he went to the hospital with severe hyperglycemia leading to diabetes ketoacidosis recently.  He did have A1c of 6.1% consistent with prediabetes in 2016. His disease course has been improving. There are no hypoglycemic associated symptoms. Pertinent negatives for hypoglycemia include no confusion, headaches, pallor or seizures. Associated symptoms include polydipsia and polyuria. Pertinent negatives for diabetes include no chest pain, no fatigue, no polyphagia and no weakness. There are no hypoglycemic complications. Symptoms are improving. Risk factors for coronary artery disease include diabetes mellitus, dyslipidemia, hypertension, male sex, tobacco exposure, sedentary lifestyle, family history and obesity. His weight is increasing steadily. He is following a generally unhealthy diet. When asked about meal planning, he  reported none. He has not had a previous visit with a dietitian. He never participates in exercise. His home blood glucose trend is decreasing steadily. His breakfast blood glucose range is generally 180-200 mg/dl. His lunch blood glucose range is generally 180-200 mg/dl. His dinner blood glucose range is generally >200 mg/dl. His bedtime blood glucose range is generally >200 mg/dl. His overall blood glucose range is >200 mg/dl. (He uses his CGM on his phone.  His average blood glucose is 211 improving significantly.  His AGP report shows 27% time in range, 51% level 1 hyperglycemia, 22% level 2 hyperglycemia.  He has no hypoglycemia documented or reported.  His point-of-care A1c is 8.4% improving from 13.1%.   ) An ACE inhibitor/angiotensin II receptor blocker is being taken.  Hypertension This is a chronic problem. The current episode started more than 1 year ago. The problem is controlled. Pertinent negatives include no chest pain, headaches, neck pain, palpitations or shortness of breath. Risk factors for coronary artery disease include obesity, male gender, dyslipidemia, diabetes mellitus, smoking/tobacco exposure and sedentary lifestyle. Past treatments include beta blockers and diuretics. Compliance problems: He has a documented history of medical noncompliance.   Hyperlipidemia This is a chronic problem. Exacerbating diseases include diabetes and obesity. Pertinent negatives include no chest pain, myalgias or shortness of breath. Current antihyperlipidemic treatment includes statins. Risk factors for coronary artery disease include dyslipidemia, diabetes mellitus, hypertension, male sex, a sedentary lifestyle, family history and obesity.       Objective:       08/20/2022    3:30 PM 06/13/2022   11:07 AM 06/13/2022   10:00 AM  Vitals with BMI  Height 5\' 8"  5\' 8"  5\' 8"   Weight 227 lbs 3 oz 218 lbs 218 lbs  BMI 34.55 33.15 33.15  Systolic 120 136   Diastolic 76 84   Pulse 100 84     BP  120/76   Pulse 100   Ht 5\' 8"  (1.727 m)   Wt  227 lb 3.2 oz (103.1 kg)   BMI 34.55 kg/m   Wt Readings from Last 3 Encounters:  08/20/22 227 lb 3.2 oz (103.1 kg)  06/13/22 218 lb (98.9 kg)  06/13/22 218 lb (98.9 kg)    Patient has reluctant affect.     CMP ( most recent) CMP     Component Value Date/Time   NA 137 05/07/2022 1339   K 4.5 05/07/2022 1339   CL 94 (L) 05/07/2022 1339   CO2 32 05/07/2022 1339   GLUCOSE 226 (H) 05/07/2022 1339   BUN 20 05/07/2022 1339   CREATININE 0.84 05/07/2022 1339   CALCIUM 9.8 05/07/2022 1339   PROT 8.0 05/07/2022 1339   ALBUMIN 2.9 (L) 04/04/2022 1214   AST 22 05/07/2022 1339   ALT 25 05/07/2022 1339   ALKPHOS 61 04/04/2022 1214   BILITOT 0.5 05/07/2022 1339   GFRNONAA >60 04/07/2022 0553   GFRAA >60 09/02/2019 1656     Diabetic Labs (most recent): Lab Results  Component Value Date   HGBA1C 8.4 (A) 08/20/2022   HGBA1C 13.1 (H) 04/03/2022   HGBA1C 6.1 (H) 07/08/2014        Lab Results  Component Value Date   TSH 0.904 10/30/2016   TSH 2.280 07/07/2014     Assessment & Plan:   1. Type 2 diabetes mellitus with hyperglycemia, with long-term current use of insulin (HCC)  - Wesley Ho has currently uncontrolled symptomatic type 2 DM since  67 years of age.  He uses his CGM on his phone.  His average blood glucose is 211 improving significantly.  His AGP report shows 27% time in range, 51% level 1 hyperglycemia, 22% level 2 hyperglycemia.  He has no hypoglycemia documented or reported.  His point-of-care A1c is 8.4% improving from 13.1%.    Recent labs reviewed. - I had a long discussion with him about the possible risk factors and  the pathology behind its diabetes and its complications. -his diabetes is complicated by obesity/sedentary life and he remains at a high risk for more acute and chronic complications which include CAD, CVA, CKD, retinopathy, and neuropathy. These are all discussed in detail with him.  - I  discussed all available options of managing his diabetes including de-escalation of medications. I have counseled him on Food as Medicine by adopting a Whole Food , Plant Predominant  ( WFPP) nutrition as recommended by [employer] of Lifestyle Medicine. Patient is encouraged to switch to  unprocessed or minimally processed  complex starch, adequate protein intake (mainly plant source), minimal liquid fat, plenty of fruits, and vegetables. -  he is advised to stick to a routine mealtimes to eat 3 complete meals a day and snack only when necessary ( to snack only to correct hypoglycemia BG <70 day time or <100 at night).    - he acknowledges that there is a room for improvement in his food and drink choices. - Further Specific Suggestion is made for him to avoid simple carbohydrates  from his diet including Cakes, Sweet Desserts, Ice Cream, Soda (diet and regular), Sweet Tea, Candies, Chips, Cookies, Store Bought Juices, Alcohol ,  Artificial Sweeteners,  Coffee Creamer, and "Sugar-free" Products. This will help patient to have more stable blood glucose profile and potentially avoid unintended weight gain.   - he will be scheduled with Norm Salt, RDN, CDE for individualized diabetes education.  - I have approached him with the following individualized plan to manage  his diabetes and patient agrees:   -  He is responding to his basal insulin.  He is advised to increase Lantus to 70 units nightly, advised to discontinue glipizide at this time.   -He will continue to use his CGM continuously.  He is advised to continue metformin 500 mg p.o. twice daily, Rybelsus 7 mg p.o. daily at breakfast.   -He is also on Jardiance 25 mg p.o. daily at breakfast, advised to continue.  - Specific targets for  A1c;  LDL, HDL,  and Triglycerides were discussed with the patient.  2) Blood Pressure /Hypertension:  His blood pressure is controlled to target.  he is advised to continue his current medications  including chlorthalidone 25 mg p.o. daily, Lasix as needed.   3) Lipids/Hyperlipidemia: He has no recent lipid panel to review.  He is advised to continue atorvastatin 40 mg p.o. nightly.      Side effects and precautions discussed with him.  4)  Weight/Diet:  Body mass index is 34.55 kg/m.  -   clearly complicating his diabetes care.   he is  a candidate for weight loss. I discussed with him the fact that loss of 5 - 10% of his  current body weight will have the most impact on his diabetes management.  The above detailed  ACLM recommendations for nutrition, exercise, sleep, social life, avoidance of risky substances, the need for restorative sleep   information will also detailed on discharge instructions.  5) Chronic Care/Health Maintenance:  -he  is on Statin medications and  is encouraged to initiate and continue to follow up with Ophthalmology, Dentist,  Podiatrist at least yearly or according to recommendations, and advised to   stay away from smoking. I have recommended yearly flu vaccine and pneumonia vaccine at least every 5 years; moderate intensity exercise for up to 150 minutes weekly; and  sleep for 7- 9 hours a day.  - he is  advised to maintain close follow up with Benita Stabile, MD for primary care needs, as well as his other providers for optimal and coordinated care.   I spent  26  minutes in the care of the patient today including review of labs from CMP, Lipids, Thyroid Function, Hematology (current and previous including abstractions from other facilities); face-to-face time discussing  his blood glucose readings/logs, discussing hypoglycemia and hyperglycemia episodes and symptoms, medications doses, his options of short and long term treatment based on the latest standards of care / guidelines;  discussion about incorporating lifestyle medicine;  and documenting the encounter. Risk reduction counseling performed per USPSTF guidelines to reduce  obesity and cardiovascular risk  factors.     Please refer to Patient Instructions for Blood Glucose Monitoring and Insulin/Medications Dosing Guide"  in media tab for additional information. Please  also refer to " Patient Self Inventory" in the Media  tab for reviewed elements of pertinent patient history.  Wesley Ho participated in the discussions, expressed understanding, and voiced agreement with the above plans.  All questions were answered to his satisfaction. he is encouraged to contact clinic should he have any questions or concerns prior to his return visit.   Follow up plan: - Return in about 3 months (around 11/20/2022) for F/U with Pre-visit Labs, Meter/CGM/Logs, A1c here.  Marquis Lunch, MD [facility] [address] [place], [place] [address] Phone: [phone]  Fax: [phone]    08/20/2022, 6:42 PM  This note was partially dictated with voice recognition software. Similar sounding words can be transcribed inadequately or  may not  be corrected upon review.

## 2022-08-20 NOTE — Patient Instructions (Addendum)
GOals  Increase fresh fruits and vegetabesl with meals Focus on whole plant based foods Get FBS less than 130 and bedtime less than 150's. Get A1C down to 7%

## 2022-08-20 NOTE — Patient Instructions (Signed)

## 2022-08-20 NOTE — Progress Notes (Signed)
Medical Nutrition Therapy  Appointment Start time:  1000 Appointment End time:  1100  Primary concerns today: DM Type 2  Referral diagnosis: E11.8 Preferred learning style: Visual  Learning readiness: Ready   NUTRITION ASSESSMENT Follow up DM Saw Dr Fransico Him today. A1C down to 8.4% from 13% . Will increase  Lantus to 70 units per Dr. Fransico Him from 60 units a day. Will stop Glipizide. Is on Rybelsus and tolerating it well. Feels better on it and sees improvement with his DM. Has changed snack choices from cookies to vanilla wafers.  Had cut down on chocolate milk a lot. Reduced  to drinking less milk and trying to drink more water. Willing to try propel as he works towards drinking more water. Feeling better and has a little more energy now.    He is willing to continue to work on eating more plant based foods from a garden and cut out processed foods.   Goals previously set: Eat three meals per day at times discussed.-working on it. Focus on whole plant based foods from a garden-work in progress Drink only water-improving Cut out chocolate milk, propel and only drink water- working on cutting out chocolate milk Get A1C down to 7%- A1C 8.,4%--improvement Avoid eating after 7 pm.-working on it. Use CGM to guide food decisions based on blood sugars.   Wt Readings from Last 3 Encounters:  08/20/22 227 lb 3.2 oz (103.1 kg)  06/13/22 218 lb (98.9 kg)  06/13/22 218 lb (98.9 kg)   Ht Readings from Last 3 Encounters:  08/20/22 5\' 8"  (1.727 m)  06/13/22 5\' 8"  (1.727 m)  06/13/22 5\' 8"  (1.727 m)   There is no height or weight on file to calculate BMI. @BMIFA @ Facility age limit for growth %iles is 20 years. Facility age limit for growth %iles is 20 years.    Clinical Medical Hx: DM Type 2, Obesity, Cirrhosis of liver, GERD, HTN, Hyperlipidemia, depression and anxiety Medications: Rybelsus, Lantus, Jardiance Labs:  Lab Results  Component Value Date   HGBA1C 8.4 (A) 08/20/2022        Latest Ref Rng & Units 05/07/2022    1:39 PM 04/07/2022    5:53 AM 04/06/2022    5:23 PM  CMP  Glucose 65 - 99 mg/dL 098  119  147   BUN 7 - 25 mg/dL 20  9  9    Creatinine 0.70 - 1.35 mg/dL 8.29  5.62  1.30   Sodium 135 - 146 mmol/L 137  135  136   Potassium 3.5 - 5.3 mmol/L 4.5  3.3  3.1   Chloride 98 - 110 mmol/L 94  93  92   CO2 20 - 32 mmol/L 32  31  30   Calcium 8.6 - 10.3 mg/dL 9.8  8.6  8.8   Total Protein 6.1 - 8.1 g/dL 8.0     Total Bilirubin 0.2 - 1.2 mg/dL 0.5     AST 10 - 35 U/L 22     ALT 9 - 46 U/L 25       Notable Signs/Symptoms: Chronic fatigue, neuropathy in feet, increased thirst, blurry vision  Lifestyle & Dietary Hx Lives by himself  Estimated daily fluid intake: 60 oz Supplements: See chart Sleep: a lot Stress / self-care: his health Current average weekly physical activity: ADL due to neuropathy  24-Hr Dietary Recall Drinking more water and less chocolate milk. Trying to eat more vegetables. Will look into [employer].   Estimated Energy Needs Calories:  1600 Carbohydrate: 180g Protein: 120g Fat: 44g   NUTRITION DIAGNOSIS  NB-1.1 Food and nutrition-related knowledge deficit As related to Excessive carb, fat and salt intake.  As evidenced by DM Type 2, A1C 13.2%, Obesity.   NUTRITION INTERVENTION  Nutrition education (E-1) on the following topics:  Nutrition and Diabetes education provided on My Plate, CHO counting, meal planning, portion sizes, timing of meals, avoiding snacks between meals unless having a low blood sugar, target ranges for A1C and blood sugars, signs/symptoms and treatment of hyper/hypoglycemia, monitoring blood sugars, taking medications as prescribed, benefits of exercising 30 minutes per day and prevention of complications of DM.  Lifestyle Medicine  - Whole Food, Plant Predominant Nutrition is highly recommended: Eat Plenty of vegetables, Mushrooms, fruits, Legumes, Whole Grains, Nuts, seeds in lieu of  processed meats, processed snacks/pastries red meat, poultry, eggs.    -It is better to avoid simple carbohydrates including: Cakes, Sweet Desserts, Ice Cream, Soda (diet and regular), Sweet Tea, Candies, Chips, Cookies, Store Bought Juices, Alcohol in Excess of  1-2 drinks a day, Lemonade,  Artificial Sweeteners, Doughnuts, Coffee Creamers, "Sugar-free" Products, etc, etc.  This is not a complete list.....  Exercise: If you are able: 30 -60 minutes a day ,4 days a week, or 150 minutes a week.  The longer the better.  Combine stretch, strength, and aerobic activities.  If you were told in the past that you have high risk for cardiovascular diseases, you may seek evaluation by your heart doctor prior to initiating moderate to intense exercise programs.   Handouts Provided Include  Hyper/hypoglycemia signs and symptoms and treatment Lifestyle Medicine Target A1C's Heart Health with DM   Learning Style & Readiness for Change Teaching method utilized: Visual & Auditory  Demonstrated degree of understanding via: Teach Back  Barriers to learning/adherence to lifestyle change: None  Goals Established by Pt  Increase fresh fruits and vegetabesl with meals Focus on whole plant based foods Get FBS less than 130 and bedtime less than 150's. Get A1C down to 7%   MONITORING & EVALUATION Dietary intake, weekly physical activity, and blood sugars in 3 month.  Next Steps  Patient is to work on consistent meal planning and whole plant based foods.Marland Kitchen

## 2022-08-21 ENCOUNTER — Other Ambulatory Visit: Payer: Self-pay

## 2022-08-21 DIAGNOSIS — E1165 Type 2 diabetes mellitus with hyperglycemia: Secondary | ICD-10-CM

## 2022-08-28 DIAGNOSIS — E119 Type 2 diabetes mellitus without complications: Secondary | ICD-10-CM | POA: Diagnosis not present

## 2022-08-28 DIAGNOSIS — Z713 Dietary counseling and surveillance: Secondary | ICD-10-CM | POA: Diagnosis not present

## 2022-08-28 DIAGNOSIS — Z7182 Exercise counseling: Secondary | ICD-10-CM | POA: Diagnosis not present

## 2022-09-10 DIAGNOSIS — Z794 Long term (current) use of insulin: Secondary | ICD-10-CM | POA: Diagnosis not present

## 2022-09-10 DIAGNOSIS — Z7182 Exercise counseling: Secondary | ICD-10-CM | POA: Diagnosis not present

## 2022-09-10 DIAGNOSIS — E119 Type 2 diabetes mellitus without complications: Secondary | ICD-10-CM | POA: Diagnosis not present

## 2022-09-10 DIAGNOSIS — Z713 Dietary counseling and surveillance: Secondary | ICD-10-CM | POA: Diagnosis not present

## 2022-09-10 DIAGNOSIS — Z7984 Long term (current) use of oral hypoglycemic drugs: Secondary | ICD-10-CM | POA: Diagnosis not present

## 2022-09-24 DIAGNOSIS — E119 Type 2 diabetes mellitus without complications: Secondary | ICD-10-CM | POA: Diagnosis not present

## 2022-09-24 DIAGNOSIS — E1165 Type 2 diabetes mellitus with hyperglycemia: Secondary | ICD-10-CM | POA: Diagnosis not present

## 2022-09-24 DIAGNOSIS — Z7984 Long term (current) use of oral hypoglycemic drugs: Secondary | ICD-10-CM | POA: Diagnosis not present

## 2022-10-01 DIAGNOSIS — E1142 Type 2 diabetes mellitus with diabetic polyneuropathy: Secondary | ICD-10-CM | POA: Diagnosis not present

## 2022-10-01 DIAGNOSIS — M79671 Pain in right foot: Secondary | ICD-10-CM | POA: Diagnosis not present

## 2022-10-01 DIAGNOSIS — M79672 Pain in left foot: Secondary | ICD-10-CM | POA: Diagnosis not present

## 2022-10-01 DIAGNOSIS — B351 Tinea unguium: Secondary | ICD-10-CM | POA: Diagnosis not present

## 2022-10-08 DIAGNOSIS — Z7689 Persons encountering health services in other specified circumstances: Secondary | ICD-10-CM | POA: Diagnosis not present

## 2022-10-22 DIAGNOSIS — Z713 Dietary counseling and surveillance: Secondary | ICD-10-CM | POA: Diagnosis not present

## 2022-10-22 DIAGNOSIS — E1165 Type 2 diabetes mellitus with hyperglycemia: Secondary | ICD-10-CM | POA: Diagnosis not present

## 2022-10-22 DIAGNOSIS — Z7984 Long term (current) use of oral hypoglycemic drugs: Secondary | ICD-10-CM | POA: Diagnosis not present

## 2022-10-22 DIAGNOSIS — Z7182 Exercise counseling: Secondary | ICD-10-CM | POA: Diagnosis not present

## 2022-10-22 DIAGNOSIS — E119 Type 2 diabetes mellitus without complications: Secondary | ICD-10-CM | POA: Diagnosis not present

## 2022-10-22 DIAGNOSIS — Z7985 Long-term (current) use of injectable non-insulin antidiabetic drugs: Secondary | ICD-10-CM | POA: Diagnosis not present

## 2022-10-23 ENCOUNTER — Other Ambulatory Visit (HOSPITAL_COMMUNITY): Payer: Self-pay

## 2022-10-23 ENCOUNTER — Other Ambulatory Visit: Payer: Self-pay

## 2022-11-02 ENCOUNTER — Other Ambulatory Visit (HOSPITAL_COMMUNITY): Payer: Self-pay

## 2022-11-06 ENCOUNTER — Other Ambulatory Visit (HOSPITAL_COMMUNITY): Payer: Self-pay

## 2022-11-06 DIAGNOSIS — Z7182 Exercise counseling: Secondary | ICD-10-CM | POA: Diagnosis not present

## 2022-11-06 DIAGNOSIS — Z713 Dietary counseling and surveillance: Secondary | ICD-10-CM | POA: Diagnosis not present

## 2022-11-06 DIAGNOSIS — E119 Type 2 diabetes mellitus without complications: Secondary | ICD-10-CM | POA: Diagnosis not present

## 2022-11-06 DIAGNOSIS — Z7985 Long-term (current) use of injectable non-insulin antidiabetic drugs: Secondary | ICD-10-CM | POA: Diagnosis not present

## 2022-11-06 DIAGNOSIS — Z7984 Long term (current) use of oral hypoglycemic drugs: Secondary | ICD-10-CM | POA: Diagnosis not present

## 2022-11-06 DIAGNOSIS — E1165 Type 2 diabetes mellitus with hyperglycemia: Secondary | ICD-10-CM | POA: Diagnosis not present

## 2022-11-06 MED ORDER — ALPRAZOLAM 1 MG PO TABS
1.0000 mg | ORAL_TABLET | Freq: Four times a day (QID) | ORAL | 3 refills | Status: AC | PRN
  Filled 2022-11-06 – 2022-12-13 (×2): qty 90, 23d supply, fill #0
  Filled 2023-01-17: qty 90, 23d supply, fill #1

## 2022-11-06 MED ORDER — ALPRAZOLAM 1 MG PO TABS
1.0000 mg | ORAL_TABLET | Freq: Four times a day (QID) | ORAL | 0 refills | Status: AC | PRN
  Filled 2022-11-06: qty 90, 23d supply, fill #0

## 2022-11-09 DIAGNOSIS — R7989 Other specified abnormal findings of blood chemistry: Secondary | ICD-10-CM | POA: Diagnosis not present

## 2022-11-09 DIAGNOSIS — E782 Mixed hyperlipidemia: Secondary | ICD-10-CM | POA: Diagnosis not present

## 2022-11-09 DIAGNOSIS — E1100 Type 2 diabetes mellitus with hyperosmolarity without nonketotic hyperglycemic-hyperosmolar coma (NKHHC): Secondary | ICD-10-CM | POA: Diagnosis not present

## 2022-11-09 LAB — BASIC METABOLIC PANEL
BUN: 11 (ref 4–21)
CO2: 29 — AB (ref 13–22)
Chloride: 95 — AB (ref 99–108)
Creatinine: 0.9 (ref 0.6–1.3)
Glucose: 118
Potassium: 5.1 mEq/L (ref 3.5–5.1)
Sodium: 138 (ref 137–147)

## 2022-11-09 LAB — LIPID PANEL
Cholesterol: 175 (ref 0–200)
HDL: 38 (ref 35–70)
LDL Cholesterol: 111
Triglycerides: 148 (ref 40–160)

## 2022-11-09 LAB — COMPREHENSIVE METABOLIC PANEL
Albumin, Urine POC: 24.6
Albumin: 4.2 (ref 3.5–5.0)
Calcium: 9.9 (ref 8.7–10.7)
Creatinine, POC: 315.6 mg/dL
EGFR: 91
Globulin: 3.7
Microalb Creat Ratio: 8

## 2022-11-09 LAB — HEMOGLOBIN A1C: Hemoglobin A1C: 7.4

## 2022-11-09 LAB — HEPATIC FUNCTION PANEL
ALT: 16 U/L (ref 10–40)
AST: 20 (ref 14–40)
Alkaline Phosphatase: 85 (ref 25–125)
Bilirubin, Total: 0.4

## 2022-11-10 ENCOUNTER — Other Ambulatory Visit (HOSPITAL_COMMUNITY): Payer: Self-pay

## 2022-11-10 MED ORDER — ALPRAZOLAM 1 MG PO TABS
1.0000 mg | ORAL_TABLET | Freq: Four times a day (QID) | ORAL | 3 refills | Status: DC | PRN
  Filled 2022-11-10 – 2022-11-12 (×2): qty 90, 23d supply, fill #0
  Filled 2023-02-15: qty 90, 23d supply, fill #1
  Filled 2023-03-11: qty 90, 23d supply, fill #2
  Filled 2023-04-08: qty 90, 23d supply, fill #3

## 2022-11-10 MED ORDER — FREESTYLE LIBRE 3 SENSOR MISC
11 refills | Status: DC
  Filled 2022-11-10 – 2022-11-12 (×3): qty 6, 84d supply, fill #0
  Filled 2023-02-11: qty 6, 84d supply, fill #1
  Filled 2023-05-03: qty 6, 84d supply, fill #2
  Filled 2023-07-26: qty 6, 84d supply, fill #3

## 2022-11-10 MED ORDER — LANTUS SOLOSTAR 100 UNIT/ML ~~LOC~~ SOPN
30.0000 [IU] | PEN_INJECTOR | Freq: Every day | SUBCUTANEOUS | 5 refills | Status: DC
  Filled 2022-11-10: qty 15, 50d supply, fill #0

## 2022-11-12 ENCOUNTER — Other Ambulatory Visit (HOSPITAL_COMMUNITY): Payer: Self-pay

## 2022-11-12 ENCOUNTER — Other Ambulatory Visit: Payer: Self-pay

## 2022-11-12 MED ORDER — LANTUS SOLOSTAR 100 UNIT/ML ~~LOC~~ SOPN
30.0000 [IU] | PEN_INJECTOR | Freq: Every day | SUBCUTANEOUS | 5 refills | Status: DC
  Filled 2022-11-12: qty 15, 50d supply, fill #0

## 2022-11-13 ENCOUNTER — Other Ambulatory Visit: Payer: Self-pay

## 2022-11-13 ENCOUNTER — Other Ambulatory Visit (HOSPITAL_COMMUNITY): Payer: Self-pay

## 2022-11-14 ENCOUNTER — Other Ambulatory Visit: Payer: Self-pay

## 2022-11-14 ENCOUNTER — Other Ambulatory Visit (HOSPITAL_COMMUNITY): Payer: Self-pay

## 2022-11-14 MED ORDER — LANTUS SOLOSTAR 100 UNIT/ML ~~LOC~~ SOPN
30.0000 [IU] | PEN_INJECTOR | Freq: Every day | SUBCUTANEOUS | 5 refills | Status: DC
  Filled 2022-11-14: qty 21, 70d supply, fill #0

## 2022-11-15 DIAGNOSIS — E1110 Type 2 diabetes mellitus with ketoacidosis without coma: Secondary | ICD-10-CM | POA: Diagnosis not present

## 2022-11-15 DIAGNOSIS — E1310 Other specified diabetes mellitus with ketoacidosis without coma: Secondary | ICD-10-CM | POA: Diagnosis not present

## 2022-11-15 DIAGNOSIS — Z6832 Body mass index (BMI) 32.0-32.9, adult: Secondary | ICD-10-CM | POA: Diagnosis not present

## 2022-11-15 DIAGNOSIS — Z139 Encounter for screening, unspecified: Secondary | ICD-10-CM | POA: Diagnosis not present

## 2022-11-15 DIAGNOSIS — K7030 Alcoholic cirrhosis of liver without ascites: Secondary | ICD-10-CM | POA: Diagnosis not present

## 2022-11-15 DIAGNOSIS — E1140 Type 2 diabetes mellitus with diabetic neuropathy, unspecified: Secondary | ICD-10-CM | POA: Diagnosis not present

## 2022-11-15 DIAGNOSIS — K222 Esophageal obstruction: Secondary | ICD-10-CM | POA: Diagnosis not present

## 2022-11-15 DIAGNOSIS — E782 Mixed hyperlipidemia: Secondary | ICD-10-CM | POA: Diagnosis not present

## 2022-11-15 DIAGNOSIS — E669 Obesity, unspecified: Secondary | ICD-10-CM | POA: Diagnosis not present

## 2022-11-15 DIAGNOSIS — E1100 Type 2 diabetes mellitus with hyperosmolarity without nonketotic hyperglycemic-hyperosmolar coma (NKHHC): Secondary | ICD-10-CM | POA: Diagnosis not present

## 2022-11-15 DIAGNOSIS — Z23 Encounter for immunization: Secondary | ICD-10-CM | POA: Diagnosis not present

## 2022-11-15 DIAGNOSIS — I10 Essential (primary) hypertension: Secondary | ICD-10-CM | POA: Diagnosis not present

## 2022-11-15 DIAGNOSIS — K7460 Unspecified cirrhosis of liver: Secondary | ICD-10-CM | POA: Diagnosis not present

## 2022-11-20 DIAGNOSIS — E119 Type 2 diabetes mellitus without complications: Secondary | ICD-10-CM | POA: Diagnosis not present

## 2022-11-24 ENCOUNTER — Other Ambulatory Visit (HOSPITAL_COMMUNITY): Payer: Self-pay

## 2022-11-26 ENCOUNTER — Other Ambulatory Visit: Payer: Self-pay

## 2022-11-26 ENCOUNTER — Encounter: Payer: Self-pay | Admitting: "Endocrinology

## 2022-11-26 ENCOUNTER — Other Ambulatory Visit (HOSPITAL_BASED_OUTPATIENT_CLINIC_OR_DEPARTMENT_OTHER): Payer: Self-pay

## 2022-11-26 ENCOUNTER — Other Ambulatory Visit (HOSPITAL_COMMUNITY): Payer: Self-pay

## 2022-11-26 ENCOUNTER — Encounter: Payer: PPO | Attending: Internal Medicine | Admitting: Nutrition

## 2022-11-26 ENCOUNTER — Encounter: Payer: Self-pay | Admitting: Nutrition

## 2022-11-26 ENCOUNTER — Ambulatory Visit: Payer: PPO | Admitting: "Endocrinology

## 2022-11-26 VITALS — Ht 68.0 in | Wt 224.0 lb

## 2022-11-26 VITALS — BP 108/82 | HR 64 | Ht 68.0 in | Wt 224.8 lb

## 2022-11-26 DIAGNOSIS — E669 Obesity, unspecified: Secondary | ICD-10-CM | POA: Diagnosis not present

## 2022-11-26 DIAGNOSIS — Z713 Dietary counseling and surveillance: Secondary | ICD-10-CM | POA: Insufficient documentation

## 2022-11-26 DIAGNOSIS — Z794 Long term (current) use of insulin: Secondary | ICD-10-CM

## 2022-11-26 DIAGNOSIS — Z6834 Body mass index (BMI) 34.0-34.9, adult: Secondary | ICD-10-CM | POA: Diagnosis not present

## 2022-11-26 DIAGNOSIS — E782 Mixed hyperlipidemia: Secondary | ICD-10-CM

## 2022-11-26 DIAGNOSIS — I10 Essential (primary) hypertension: Secondary | ICD-10-CM

## 2022-11-26 DIAGNOSIS — E6609 Other obesity due to excess calories: Secondary | ICD-10-CM

## 2022-11-26 DIAGNOSIS — E1165 Type 2 diabetes mellitus with hyperglycemia: Secondary | ICD-10-CM

## 2022-11-26 DIAGNOSIS — E1140 Type 2 diabetes mellitus with diabetic neuropathy, unspecified: Secondary | ICD-10-CM | POA: Diagnosis not present

## 2022-11-26 MED ORDER — FUROSEMIDE 20 MG PO TABS
20.0000 mg | ORAL_TABLET | Freq: Every day | ORAL | 2 refills | Status: DC | PRN
  Filled 2022-11-26: qty 30, 30d supply, fill #0
  Filled 2022-12-19 (×2): qty 30, 30d supply, fill #1
  Filled 2023-01-11: qty 30, 30d supply, fill #2

## 2022-11-26 MED ORDER — METFORMIN HCL 1000 MG PO TABS
1000.0000 mg | ORAL_TABLET | Freq: Two times a day (BID) | ORAL | 1 refills | Status: DC
  Filled 2022-11-26: qty 60, 30d supply, fill #0
  Filled 2022-12-19 (×2): qty 60, 30d supply, fill #1
  Filled 2023-03-15 – 2023-03-18 (×4): qty 60, 30d supply, fill #2
  Filled 2023-04-05 – 2023-04-09 (×2): qty 60, 30d supply, fill #3
  Filled 2023-04-29 – 2023-05-03 (×3): qty 60, 30d supply, fill #4
  Filled 2023-05-10 – 2023-05-27 (×2): qty 60, 30d supply, fill #5

## 2022-11-26 MED ORDER — PROPRANOLOL HCL 60 MG PO TABS
60.0000 mg | ORAL_TABLET | Freq: Two times a day (BID) | ORAL | 2 refills | Status: DC
  Filled 2022-11-26: qty 60, 30d supply, fill #0
  Filled 2022-12-19 (×2): qty 60, 30d supply, fill #1
  Filled 2023-01-11: qty 60, 30d supply, fill #2

## 2022-11-26 MED ORDER — DULOXETINE HCL 30 MG PO CPEP
30.0000 mg | ORAL_CAPSULE | Freq: Two times a day (BID) | ORAL | 2 refills | Status: DC
  Filled 2022-11-26: qty 60, 30d supply, fill #0
  Filled 2022-12-19 (×2): qty 60, 30d supply, fill #1
  Filled 2023-01-11: qty 60, 30d supply, fill #2

## 2022-11-26 MED ORDER — BUSPIRONE HCL 15 MG PO TABS
15.0000 mg | ORAL_TABLET | Freq: Three times a day (TID) | ORAL | 2 refills | Status: DC
  Filled 2022-11-26: qty 90, 30d supply, fill #0
  Filled 2022-12-19 (×2): qty 90, 30d supply, fill #1
  Filled 2023-01-11: qty 90, 30d supply, fill #2

## 2022-11-26 MED ORDER — METFORMIN HCL 1000 MG PO TABS
1000.0000 mg | ORAL_TABLET | Freq: Two times a day (BID) | ORAL | 2 refills | Status: DC
  Filled 2022-11-26 – 2023-01-11 (×2): qty 60, 30d supply, fill #0
  Filled 2023-02-18 – 2023-02-21 (×3): qty 60, 30d supply, fill #1
  Filled 2023-03-08 – 2023-03-13 (×3): qty 60, 30d supply, fill #2
  Filled ????-??-??: fill #1

## 2022-11-26 MED ORDER — FINASTERIDE 5 MG PO TABS
5.0000 mg | ORAL_TABLET | Freq: Every morning | ORAL | 2 refills | Status: DC
  Filled 2022-11-26: qty 30, 30d supply, fill #0
  Filled 2022-12-19 (×2): qty 30, 30d supply, fill #1
  Filled 2023-01-11: qty 30, 30d supply, fill #2
  Filled ????-??-??: fill #1

## 2022-11-26 MED ORDER — LEVOCETIRIZINE DIHYDROCHLORIDE 5 MG PO TABS
5.0000 mg | ORAL_TABLET | Freq: Every morning | ORAL | 12 refills | Status: DC
  Filled 2022-11-26: qty 30, 30d supply, fill #0
  Filled 2022-12-19 (×2): qty 30, 30d supply, fill #1
  Filled 2023-01-11: qty 30, 30d supply, fill #2
  Filled 2023-02-18 – 2023-02-21 (×3): qty 30, 30d supply, fill #3
  Filled 2023-03-08 – 2023-03-13 (×3): qty 30, 30d supply, fill #4
  Filled 2023-03-15 – 2023-03-18 (×4): qty 30, 30d supply, fill #5
  Filled 2023-04-05 – 2023-04-09 (×2): qty 30, 30d supply, fill #6
  Filled 2023-04-29 – 2023-05-03 (×3): qty 30, 30d supply, fill #7
  Filled 2023-05-10 – 2023-05-27 (×2): qty 30, 30d supply, fill #8
  Filled 2023-06-19 – 2023-06-25 (×3): qty 30, 30d supply, fill #9
  Filled 2023-07-12 – 2023-07-18 (×2): qty 30, 30d supply, fill #10
  Filled 2023-08-06 – 2023-09-05 (×2): qty 30, 30d supply, fill #11
  Filled 2023-10-08: qty 30, 30d supply, fill #12
  Filled ????-??-??: fill #3

## 2022-11-26 MED ORDER — LANTUS SOLOSTAR 100 UNIT/ML ~~LOC~~ SOPN
60.0000 [IU] | PEN_INJECTOR | Freq: Every day | SUBCUTANEOUS | 1 refills | Status: DC
  Filled 2022-11-26: qty 30, 50d supply, fill #0
  Filled 2022-12-12: qty 15, 25d supply, fill #0
  Filled 2023-01-03: qty 15, 25d supply, fill #1
  Filled 2023-02-04: qty 15, 25d supply, fill #2
  Filled 2023-02-28: qty 15, 25d supply, fill #3

## 2022-11-26 MED ORDER — OMEGA-3-ACID ETHYL ESTERS 1 G PO CAPS
2.0000 g | ORAL_CAPSULE | Freq: Every morning | ORAL | 2 refills | Status: DC
  Filled 2022-11-26: qty 60, 30d supply, fill #0
  Filled 2022-12-19 (×2): qty 60, 30d supply, fill #1
  Filled 2023-01-11: qty 60, 30d supply, fill #2

## 2022-11-26 MED ORDER — RYBELSUS 7 MG PO TABS
7.0000 mg | ORAL_TABLET | Freq: Every day | ORAL | 1 refills | Status: DC
  Filled 2022-11-26: qty 30, 30d supply, fill #0

## 2022-11-26 MED ORDER — CHLORTHALIDONE 25 MG PO TABS
25.0000 mg | ORAL_TABLET | Freq: Every morning | ORAL | 2 refills | Status: DC
  Filled 2022-11-26: qty 30, 30d supply, fill #0
  Filled 2022-12-19 (×2): qty 30, 30d supply, fill #1
  Filled 2023-01-11: qty 30, 30d supply, fill #2

## 2022-11-26 MED ORDER — ATORVASTATIN CALCIUM 80 MG PO TABS
80.0000 mg | ORAL_TABLET | Freq: Every day | ORAL | 1 refills | Status: DC
  Filled 2022-11-26: qty 30, 30d supply, fill #0
  Filled 2022-12-19 (×2): qty 30, 30d supply, fill #1
  Filled 2023-03-15 – 2023-03-18 (×5): qty 30, 30d supply, fill #2

## 2022-11-26 MED ORDER — MONTELUKAST SODIUM 10 MG PO TABS
10.0000 mg | ORAL_TABLET | Freq: Every morning | ORAL | 2 refills | Status: DC
  Filled 2022-11-26: qty 30, 30d supply, fill #0
  Filled 2022-12-19 (×2): qty 30, 30d supply, fill #1
  Filled 2023-01-11: qty 30, 30d supply, fill #2

## 2022-11-26 MED ORDER — ATORVASTATIN CALCIUM 80 MG PO TABS
80.0000 mg | ORAL_TABLET | Freq: Every day | ORAL | 2 refills | Status: DC
  Filled 2022-11-26 – 2023-01-11 (×2): qty 30, 30d supply, fill #0
  Filled 2023-02-18 – 2023-02-21 (×3): qty 30, 30d supply, fill #1
  Filled 2023-03-08 – 2023-03-13 (×3): qty 30, 30d supply, fill #2
  Filled ????-??-??: fill #1

## 2022-11-26 NOTE — Patient Instructions (Addendum)
Goals Keep up the great job! Increase fruits, vegetables and whole grains Try eating at healthier restaurants like [address] [employer] or other places that have more vegetables and healthier choices. Exercise as tolerated. Get A1C to 7%.

## 2022-11-26 NOTE — Progress Notes (Signed)
11/26/2022, 11:06 AM  Endocrinology follow-up note   Subjective:    Patient ID: Wesley Ho, male    DOB: June 20, 1955.  Wesley Ho is being seen in follow-up after he was seen in consultation for management of currently uncontrolled symptomatic diabetes requested by  Benita Stabile, MD.   Past Medical History:  Diagnosis Date   Anxiety    Arthritis    Bronchitis    Chronic diarrhea    Chronic edema of lower extremity 09/28/2010   COPD (chronic obstructive pulmonary disease) (HCC)    Diabetes mellitus without complication (HCC)    Diabetes mellitus, type II (HCC)    DVT (deep venous thrombosis) (HCC) 09/28/2010   Emphysema of lung (HCC)    Fatigue    H/O Thrombocytopenia 09/28/2010   Hypertension    Left radial fracture    Multiple lung nodules 09/28/2010   Neuropathy     Past Surgical History:  Procedure Laterality Date   BIOPSY  05/29/2022   Procedure: BIOPSY;  Surgeon: Dolores Frame, MD;  Location: [facility];  Service: Gastroenterology;;   CATARACT EXTRACTION W/PHACO Right 01/17/2016   Procedure: CATARACT EXTRACTION PHACO AND INTRAOCULAR LENS PLACEMENT (IOC);  Surgeon: Jethro Bolus, MD;  Location: [facility];  Service: Ophthalmology;  Laterality: Right;  CDE: 4.34   CATARACT EXTRACTION W/PHACO Left 01/31/2016   Procedure: CATARACT EXTRACTION PHACO AND INTRAOCULAR LENS PLACEMENT (IOC);  Surgeon: Jethro Bolus, MD;  Location: [facility];  Service: Ophthalmology;  Laterality: Left;  CDE: 5.69   CHOLECYSTECTOMY     ESOPHAGEAL DILATION N/A 05/29/2022   Procedure: ESOPHAGEAL DILATION;  Surgeon: Dolores Frame, MD;  Location: [facility];  Service: Gastroenterology;  Laterality: N/A;   ESOPHAGOGASTRODUODENOSCOPY (EGD) WITH PROPOFOL N/A 05/29/2022   Procedure: ESOPHAGOGASTRODUODENOSCOPY (EGD) WITH PROPOFOL;  Surgeon: Dolores Frame, MD;  Location: [facility];  Service: Gastroenterology;  Laterality: N/A;  10:30 am, asa 3   MOUTH SURGERY      Social History   Socioeconomic History   Marital status: Divorced    Spouse name: Not on file   Number of children: 2   Years of education: Not on file   Highest education level: Not on file  Occupational History   Occupation: Retired  Tobacco Use   Smoking status: Former    Current packs/day: 0.00    Average packs/day: 1 pack/day for 40.0 years (40.0 ttl pk-yrs)    Types: Cigarettes    Start date: 01/11/1973    Quit date: 01/11/2013    Years since quitting: 9.8   Smokeless tobacco: Never  Vaping Use   Vaping status: Never Used  Substance and Sexual Activity   Alcohol use: No    Comment: stopped drinking 2021   Drug use: No   Sexual activity: Not Currently    Birth control/protection: None  Other Topics Concern   Not on file  Social History Narrative   Not on file   Social Determinants of Health   Financial Resource Strain: Not on file  Food Insecurity: No Food Insecurity (04/03/2022)   Hunger Vital Sign  Worried About Programme researcher, broadcasting/film/video in the Last Year: Never true    Ran Out of Food in the Last Year: Never true  Transportation Needs: No Transportation Needs (04/03/2022)   PRAPARE - Administrator, Civil Service (Medical): No    Lack of Transportation (Non-Medical): No  Physical Activity: Not on file  Stress: Not on file  Social Connections: Not on file    Family History  Problem Relation Age of Onset   Diabetes Mother    Clotting disorder Father    Alcohol abuse Maternal Uncle    Lung cancer Maternal Grandfather    Emphysema Paternal Grandfather     Outpatient Encounter Medications as of 11/26/2022  Medication Sig   Semaglutide (RYBELSUS) 7 MG TABS Take 1 tablet (7 mg total) by mouth daily.   acetaminophen (TYLENOL) 500 MG tablet Take 500-1,000 mg by mouth every 6 (six) hours as needed for mild pain.   ALPRAZolam (XANAX) 1 MG tablet Take 1 mg by mouth  every 6 (six) hours as needed for anxiety.   ALPRAZolam (XANAX) 1 MG tablet Take 1 tablet (1 mg total) by mouth every 6 (six) hours as needed.   ALPRAZolam (XANAX) 1 MG tablet Take 1 tablet (1 mg total) by mouth every 6 (six) hours as needed for anxiety   ALPRAZolam (XANAX) 1 MG tablet TAKE ONE TABLET BY MOUTH EVERY 6 HOURS AS NEEDED FOR ANXIETY   atorvastatin (LIPITOR) 80 MG tablet Take 1 tablet (80 mg total) by mouth daily.   Blood Glucose Monitoring Suppl DEVI 1 each by Does not apply route in the morning, at noon, and at bedtime. May substitute to any manufacturer covered by patient's insurance.   busPIRone (BUSPAR) 15 MG tablet Take 1 tablet (15 mg total) by mouth 3 (three) times daily. (Patient taking differently: Take 15 mg by mouth 2 (two) times daily.)   carboxymethylcellulose (REFRESH TEARS) 0.5 % SOLN Place 1 drop into both eyes 2 (two) times daily as needed (dry eyes).   chlorthalidone (HYGROTON) 25 MG tablet Take 25 mg by mouth every morning.   Continuous Glucose Sensor (FREESTYLE LIBRE 3 SENSOR) MISC Place 1 sensor on the skin every 14 days. Use to check glucose continuously   Continuous Glucose Sensor (FREESTYLE LIBRE 3 SENSOR) MISC Use as directed every 14 (fourteen) days.   Cyanocobalamin 2500 MCG TABS Take 2,500 mcg by mouth daily.   DULoxetine (CYMBALTA) 30 MG capsule Take 30 mg by mouth 2 (two) times daily.   finasteride (PROSCAR) 5 MG tablet Take 5 mg by mouth daily.   furosemide (LASIX) 20 MG tablet Take 20 mg by mouth daily.   insulin glargine (LANTUS SOLOSTAR) 100 UNIT/ML Solostar Pen Inject 60 Units into the skin at bedtime.   Insulin Pen Needle 32G X 4 MM MISC Insulin pen needle   lactulose (CHRONULAC) 10 GM/15ML solution Take 30 mLs (20 g total) by mouth 2 (two) times daily.   levocetirizine (XYZAL) 5 MG tablet Take 5 mg by mouth every morning.   lidocaine-prilocaine (EMLA) cream Apply 1 Application topically daily as needed (pain).   metFORMIN (GLUCOPHAGE) 1000 MG  tablet Take 1 tablet (1,000 mg total) by mouth 2 (two) times daily with a meal.   montelukast (SINGULAIR) 10 MG tablet Take 10 mg by mouth daily.   omega-3 acid ethyl esters (LOVAZA) 1 g capsule Take 2 g by mouth daily.   omeprazole (PRILOSEC) 40 MG capsule Take 1 capsule (40 mg total) by mouth 2 (two)  times daily.   potassium chloride SA (KLOR-CON M) 20 MEQ tablet Take 1 tablet (20 mEq total) by mouth daily for 5 days. (Patient not taking: Reported on 05/23/2022)   pregabalin (LYRICA) 200 MG capsule Take 200 mg by mouth 2 (two) times daily.   propranolol (INDERAL) 60 MG tablet Take 60 mg by mouth 2 (two) times daily.   sucralfate (CARAFATE) 1 GM/10ML suspension Take 10 mLs (1 g total) by mouth 4 (four) times daily -  with meals and at bedtime. (Patient not taking: Reported on 08/13/2022)   [DISCONTINUED] atorvastatin (LIPITOR) 80 MG tablet Take 80 mg by mouth daily.   [DISCONTINUED] insulin glargine (LANTUS SOLOSTAR) 100 UNIT/ML Solostar Pen Inject 70 Units into the skin at bedtime.   [DISCONTINUED] insulin glargine (LANTUS SOLOSTAR) 100 UNIT/ML Solostar Pen Inject 30 Units into the skin daily. (Patient not taking: Reported on 11/26/2022)   [DISCONTINUED] insulin glargine (LANTUS SOLOSTAR) 100 UNIT/ML Solostar Pen Inject 30 Units into the skin daily. (Patient not taking: Reported on 11/26/2022)   [DISCONTINUED] JARDIANCE 25 MG TABS tablet Take 25 mg by mouth daily.   [DISCONTINUED] metFORMIN (GLUCOPHAGE) 1000 MG tablet Take 1,000 mg by mouth 2 (two) times daily with a meal.   [DISCONTINUED] Semaglutide (RYBELSUS) 3 MG TABS TAKE 1 TABLET (3 MG) BY ORAL ROUTE ONCE DAILY FOR 30 DAYS, THEN START 7MG  TABLET DAILY   No facility-administered encounter medications on file as of 11/26/2022.    ALLERGIES: No Known Allergies  VACCINATION STATUS: Immunization History  Administered Date(s) Administered   Fluad Quad(high Dose 65+) 12/21/2019   Influenza-Unspecified 11/03/2017   Tdap 09/02/2019     Diabetes He presents for his follow-up diabetic visit. He has type 2 diabetes mellitus. Onset time: Patient reports that he was not aware of having diabetes and did he went to the hospital with severe hyperglycemia leading to diabetes ketoacidosis recently.  He did have A1c of 6.1% consistent with prediabetes in 2016. His disease course has been improving. There are no hypoglycemic associated symptoms. Pertinent negatives for hypoglycemia include no confusion, headaches, pallor or seizures. Associated symptoms include polydipsia and polyuria. Pertinent negatives for diabetes include no chest pain, no fatigue, no polyphagia and no weakness. There are no hypoglycemic complications. Symptoms are improving. Risk factors for coronary artery disease include diabetes mellitus, dyslipidemia, hypertension, male sex, tobacco exposure, sedentary lifestyle, family history and obesity. His weight is fluctuating minimally. He is following a generally unhealthy diet. When asked about meal planning, he reported none. He has not had a previous visit with a dietitian. He never participates in exercise. His home blood glucose trend is decreasing steadily. His breakfast blood glucose range is generally 140-180 mg/dl. His lunch blood glucose range is generally 140-180 mg/dl. His dinner blood glucose range is generally 140-180 mg/dl. His bedtime blood glucose range is generally 140-180 mg/dl. His overall blood glucose range is 140-180 mg/dl. (He uses his CGM on his phone.  His average blood glucose is improving to 144, AGP report showing 84% time in range, 16% level 1 hyperglycemia, 0% hypoglycemia.  His point-of-care A1c is 7.4%, progressively improving from 13.1%.  ) An ACE inhibitor/angiotensin II receptor blocker is being taken.  Hypertension This is a chronic problem. The current episode started more than 1 year ago. The problem is controlled. Pertinent negatives include no chest pain, headaches, neck pain, palpitations or  shortness of breath. Risk factors for coronary artery disease include obesity, male gender, dyslipidemia, diabetes mellitus, smoking/tobacco exposure and sedentary lifestyle. Past treatments include  beta blockers and diuretics. Compliance problems: He has a documented history of medical noncompliance.   Hyperlipidemia This is a chronic problem. Exacerbating diseases include diabetes and obesity. Pertinent negatives include no chest pain, myalgias or shortness of breath. Current antihyperlipidemic treatment includes statins. Risk factors for coronary artery disease include dyslipidemia, diabetes mellitus, hypertension, male sex, a sedentary lifestyle, family history and obesity.     Objective:       11/26/2022    9:50 AM 11/26/2022    9:20 AM 08/20/2022    3:30 PM  Vitals with BMI  Height 5\' 8"  5\' 8"  5\' 8"   Weight 224 lbs 224 lbs 13 oz 227 lbs 3 oz  BMI 34.07 34.19 34.55  Systolic  108 120  Diastolic  82 76  Pulse  64 100    BP 108/82   Pulse 64   Ht 5\' 8"  (1.727 m)   Wt 224 lb 12.8 oz (102 kg)   BMI 34.18 kg/m   Wt Readings from Last 3 Encounters:  11/26/22 224 lb (101.6 kg)  11/26/22 224 lb 12.8 oz (102 kg)  08/20/22 227 lb 3.2 oz (103.1 kg)      CMP ( most recent) CMP     Component Value Date/Time   NA 138 11/09/2022 0000   K 5.1 11/09/2022 0000   CL 95 (A) 11/09/2022 0000   CO2 29 (A) 11/09/2022 0000   GLUCOSE 226 (H) 05/07/2022 1339   BUN 11 11/09/2022 0000   CREATININE 0.9 11/09/2022 0000   CREATININE 0.84 05/07/2022 1339   CALCIUM 9.9 11/09/2022 0000   PROT 8.0 05/07/2022 1339   ALBUMIN 4.2 11/09/2022 0000   AST 20 11/09/2022 0000   ALT 16 11/09/2022 0000   ALKPHOS 85 11/09/2022 0000   BILITOT 0.5 05/07/2022 1339   GFRNONAA >60 04/07/2022 0553   GFRAA >60 09/02/2019 1656     Diabetic Labs (most recent): Lab Results  Component Value Date   HGBA1C 7.4 11/09/2022   HGBA1C 8.4 (A) 08/20/2022   HGBA1C 13.1 (H) 04/03/2022     Lab Results  Component  Value Date   TSH 0.904 10/30/2016   TSH 2.280 07/07/2014     Assessment & Plan:   1. Type 2 diabetes mellitus with hyperglycemia, with long-term current use of insulin (HCC)  - Wesley Ho has currently uncontrolled symptomatic type 2 DM since  67 years of age.  He uses his CGM on his phone.  His average blood glucose is improving to 144, AGP report showing 84% time in range, 16% level 1 hyperglycemia, 0% hypoglycemia.  His point-of-care A1c is 7.4%, progressively improving from 13.1%.   Recent labs reviewed. - I had a long discussion with him about the possible risk factors and  the pathology behind its diabetes and its complications. -his diabetes is complicated by obesity/sedentary life and he remains at a high risk for more acute and chronic complications which include CAD, CVA, CKD, retinopathy, and neuropathy. These are all discussed in detail with him.  - I discussed all available options of managing his diabetes including de-escalation of medications. I have counseled him on Food as Medicine by adopting a Whole Food , Plant Predominant  ( WFPP) nutrition as recommended by [employer] of Lifestyle Medicine. Patient is encouraged to switch to  unprocessed or minimally processed  complex starch, adequate protein intake (mainly plant source), minimal liquid fat, plenty of fruits, and vegetables. -  he is advised to stick to a routine mealtimes to eat  3 complete meals a day and snack only when necessary ( to snack only to correct hypoglycemia BG <70 day time or <100 at night).   - he acknowledges that there is a room for improvement in his food and drink choices. - Further Specific Suggestion is made for him to avoid simple carbohydrates  from his diet including Cakes, Sweet Desserts, Ice Cream, Soda (diet and regular), Sweet Tea, Candies, Chips, Cookies, Store Bought Juices, Alcohol ,  Artificial Sweeteners,  Coffee Creamer, and "Sugar-free" Products. This will help patient to have  more stable blood glucose profile and potentially avoid unintended weight gain.    - he will be scheduled with Norm Salt, RDN, CDE for individualized diabetes education.  - I have approached him with the following individualized plan to manage  his diabetes and patient agrees:   -He is responding to his current regimen of multiple medications.  He is advised to lower his Lantus to 60 units nightly, advised to continue metformin 1000 mg p.o. twice daily, advised to increase his Rybelsus to 7 mg p.o. daily at breakfast.  He could not have further access for Jardiance, will be discontinued.   He is advised to continue to use his CGM continuously.  And he is encouraged to call clinic for hypoglycemia under 70 or hyperglycemia above 200 mg/day.  - Specific targets for  A1c;  LDL, HDL,  and Triglycerides were discussed with the patient.  2) Blood Pressure /Hypertension:  -His blood pressure is controlled to target he is advised to continue his current medications including chlorthalidone 25 mg p.o. daily, Lasix as needed.   3) Lipids/Hyperlipidemia: His previsit labs show LDL uncontrolled at 111 admittedly has been without his statin for several weeks.   He is advised to continue atorvastatin 80 mg p.o. nightly.  Side effects and precautions discussed with him.    Side effects and precautions discussed with him.  4)  Weight/Diet:  Body mass index is 34.18 kg/m.  -   clearly complicating his diabetes care.   he is  a candidate for weight loss. I discussed with him the fact that loss of 5 - 10% of his  current body weight will have the most impact on his diabetes management.  The above detailed  [facility] recommendations for nutrition, exercise, sleep, social life, avoidance of risky substances, the need for restorative sleep   information will also detailed on discharge instructions.  5) Chronic Care/Health Maintenance:  -he  is on Statin medications and  is encouraged to initiate and continue to  follow up with Ophthalmology, Dentist,  Podiatrist at least yearly or according to recommendations, and advised to   stay away from smoking. I have recommended yearly flu vaccine and pneumonia vaccine at least every 5 years; moderate intensity exercise for up to 150 minutes weekly; and  sleep for 7- 9 hours a day.  - he is  advised to maintain close follow up with Benita Stabile, MD for primary care needs, as well as his other providers for optimal and coordinated care.   I spent  27  minutes in the care of the patient today including review of labs from CMP, Lipids, Thyroid Function, Hematology (current and previous including abstractions from other facilities); face-to-face time discussing  his blood glucose readings/logs, discussing hypoglycemia and hyperglycemia episodes and symptoms, medications doses, his options of short and long term treatment based on the latest standards of care / guidelines;  discussion about incorporating lifestyle medicine;  and documenting the  encounter. Risk reduction counseling performed per USPSTF guidelines to reduce  obesity and cardiovascular risk factors.     Please refer to Patient Instructions for Blood Glucose Monitoring and Insulin/Medications Dosing Guide"  in media tab for additional information. Please  also refer to " Patient Self Inventory" in the Media  tab for reviewed elements of pertinent patient history.  Wesley Ho participated in the discussions, expressed understanding, and voiced agreement with the above plans.  All questions were answered to his satisfaction. he is encouraged to contact clinic should he have any questions or concerns prior to his return visit.    Follow up plan: - Return in about 4 months (around 03/28/2023) for Bring Meter/CGM Device/Logs- A1c in Office.  Marquis Lunch, MD [facility] [address] [place], [place] [address] Phone: [phone]  Fax: [phone]     11/26/2022, 11:06 AM  This note was partially dictated with voice recognition software. Similar sounding words can be transcribed inadequately or may not  be corrected upon review.

## 2022-11-26 NOTE — Progress Notes (Signed)
Medical Nutrition Therapy  Appointment Start time:  1000 Appointment End time:  1030  Primary concerns today: DM Type 2  Referral diagnosis: E11.8 Preferred learning style: Visual  Learning readiness: Ready   NUTRITION ASSESSMENT Follow up DM Saw Dr Fransico Him today. A1C down to 7.4% from 8.4%. . Saw Dr. Fransico Him today. Lantus to be at 60 units at night daily. Reduced from 70 units at night due to some low blood sugars. Trying to be more mindful of eating healthier foods. 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   Currently on Metformin 1000 mg BID. And Rybelsus.  He is willing to continue to work on eating more plant based foods from a garden and cut out processed foods. Lost 3 lbs since last visit.    Wt Readings from Last 3 Encounters:  11/26/22 224 lb 12.8 oz (102 kg)  08/20/22 227 lb 3.2 oz (103.1 kg)  06/13/22 218 lb (98.9 kg)   Ht Readings from Last 3 Encounters:  11/26/22 5\' 8"  (1.727 m)  08/20/22 5\' 8"  (1.727 m)  06/13/22 5\' 8"  (1.727 m)   There is no height or weight on file to calculate BMI. @BMIFA @ Facility age limit for growth %iles is 20 years. Facility age limit for growth %iles is 20 years.    Clinical Medical Hx: DM Type 2, Obesity, Cirrhosis of liver, GERD, HTN, Hyperlipidemia, depression and anxiety Medications: Rybelsus,  60 Lantus down from 70,  Labs:  Lab Results  Component Value Date   HGBA1C 7.4 11/09/2022       Latest Ref Rng & Units 11/09/2022   12:00 AM 05/07/2022    1:39 PM 04/07/2022    5:53 AM  CMP  Glucose 65 - 99 mg/dL  324  401   BUN 4 - 21 11     20  9    Creatinine 0.6 - 1.3 0.9     0.84  0.78   Sodium 137 - 147 138     137  135   Potassium 3.5 - 5.1 mEq/L 5.1     4.5  3.3   Chloride 99 - 108 95     94  93   CO2 13 - 22 29     32  31   Calcium 8.7 - 10.7 9.9     9.8  8.6   Total Protein 6.1 - 8.1 g/dL  8.0    Total Bilirubin 0.2 - 1.2 mg/dL  0.5    Alkaline Phos 25 - 125 85        AST 14 - 40 20     22    ALT 10 - 40 U/L 16     25        This result is from an external source.    Notable Signs/Symptoms: Chronic fatigue, neuropathy in feet, increased thirst, blurry vision  Lifestyle & Dietary Hx Lives by himself  Estimated daily fluid intake: 60 oz Supplements: See chart Sleep: a lot Stress / self-care: his health Current average weekly physical activity: ADL due to neuropathy  24-Hr Dietary Recall Drinking more water and less chocolate milk. Trying to eat more vegetables. Will look into [employer].   Estimated Energy Needs Calories: 1600 Carbohydrate: 180g Protein: 120g Fat: 44g   NUTRITION DIAGNOSIS  NB-1.1 Food and nutrition-related knowledge deficit As related to Excessive carb, fat and salt intake.  As evidenced by DM Type 2, A1C 13.2%, Obesity.   NUTRITION INTERVENTION  Nutrition education (E-1) on the  following topics:  Nutrition and Diabetes education provided on My Plate, CHO counting, meal planning, portion sizes, timing of meals, avoiding snacks between meals unless having a low blood sugar, target ranges for A1C and blood sugars, signs/symptoms and treatment of hyper/hypoglycemia, monitoring blood sugars, taking medications as prescribed, benefits of exercising 30 minutes per day and prevention of complications of DM.  Lifestyle Medicine  - Whole Food, Plant Predominant Nutrition is highly recommended: Eat Plenty of vegetables, Mushrooms, fruits, Legumes, Whole Grains, Nuts, seeds in lieu of processed meats, processed snacks/pastries red meat, poultry, eggs.    -It is better to avoid simple carbohydrates including: Cakes, Sweet Desserts, Ice Cream, Soda (diet and regular), Sweet Tea, Candies, Chips, Cookies, Store Bought Juices, Alcohol in Excess of  1-2 drinks a day, Lemonade,  Artificial Sweeteners, Doughnuts, Coffee Creamers, "Sugar-free" Products, etc, etc.  This is not a complete list.....  Exercise: If you are able: 30 -60 minutes a day ,4 days a week, or 150  minutes a week.  The longer the better.  Combine stretch, strength, and aerobic activities.  If you were told in the past that you have high risk for cardiovascular diseases, you may seek evaluation by your heart doctor prior to initiating moderate to intense exercise programs.   Handouts Provided Include  Hyper/hypoglycemia signs and symptoms and treatment Lifestyle Medicine Target A1C's Heart Health with DM   Learning Style & Readiness for Change Teaching method utilized: Visual & Auditory  Demonstrated degree of understanding via: Teach Back  Barriers to learning/adherence to lifestyle change: None  Goals Established by Pt  Goals Keep up the great job! Increase fruits, vegetables and whole grains Try eating at healthier restaurants like [address], [employer] or other places that have more vegetables and healthier choices. Exercise as tolerated. Get A1C to 7%   MONITORING & EVALUATION Dietary intake, weekly physical activity, and blood sugars in 3 month.  Next Steps  Patient is to work on consistent meal planning and whole plant based foods.Marland Kitchen

## 2022-11-26 NOTE — Patient Instructions (Signed)

## 2022-11-27 ENCOUNTER — Other Ambulatory Visit: Payer: Self-pay

## 2022-11-27 ENCOUNTER — Other Ambulatory Visit (HOSPITAL_COMMUNITY): Payer: Self-pay

## 2022-11-27 MED ORDER — PREGABALIN 50 MG PO CAPS
50.0000 mg | ORAL_CAPSULE | Freq: Two times a day (BID) | ORAL | 2 refills | Status: DC
  Filled 2022-11-27 (×2): qty 60, 30d supply, fill #0
  Filled 2023-02-12: qty 60, 30d supply, fill #1

## 2022-11-29 ENCOUNTER — Other Ambulatory Visit: Payer: Self-pay

## 2022-12-03 ENCOUNTER — Other Ambulatory Visit (HOSPITAL_COMMUNITY): Payer: Self-pay

## 2022-12-04 DIAGNOSIS — E119 Type 2 diabetes mellitus without complications: Secondary | ICD-10-CM | POA: Diagnosis not present

## 2022-12-12 ENCOUNTER — Other Ambulatory Visit: Payer: Self-pay

## 2022-12-12 ENCOUNTER — Other Ambulatory Visit (HOSPITAL_COMMUNITY): Payer: Self-pay

## 2022-12-12 DIAGNOSIS — Z5986 Financial insecurity: Secondary | ICD-10-CM

## 2022-12-12 NOTE — Progress Notes (Signed)
   12/12/2022  Patient ID: Wesley Ho, male   DOB: 06-18-1955, 67 y.o.   MRN: [id number]    2025 Medication Assistance Renewal Application Summary:  Patient was outreached regarding medication assistance renewal for 2025. Verified address, anticipated insurance for 2025, and income has not changed. Patient remains interested in PAP for 2025 for Rybelsus, please see below for other medication(s) identified for medication assistance.    Medication Review Findings:  Patient is currently prescribed Lantus but was not receiving medication assistance. He is interested in receiving PAP.  Patient reports that he was approved for Jardiance but only received one shipment and had not received any additional refills. BI typically send medications for PAP to patient's home. Given the circumstance, Mr. Carolyne Fiscal self- discontinued Jardiance and claims provider is aware. Patient is open to resuming Jardiance if clinically indicated. Currently, patient is not on optimized dose of Rybelsus and A1c is 7.4%. Will defer PAP for Jardiance based on providers clinical judgement but will inform provider for referral for PAP for Jardiance.    Medication Assistance Findings:  Medication assistance needs identified: Lantus and Rybelsus      Plan: I will route patient assistance letter to pharmacy technician who will coordinate patient assistance program application process for medications listed above.  Pharmacy technician will assist with obtaining all required documents from both patient and provider(s) and submit application(s) once completed.    Thank you for allowing pharmacy to be a part of this patient's care.  Cephus Shelling, PharmD Clinical Pharmacist [facility] Cell: [phone]

## 2022-12-13 ENCOUNTER — Other Ambulatory Visit: Payer: Self-pay

## 2022-12-13 ENCOUNTER — Other Ambulatory Visit (HOSPITAL_COMMUNITY): Payer: Self-pay

## 2022-12-14 ENCOUNTER — Other Ambulatory Visit: Payer: Self-pay

## 2022-12-14 ENCOUNTER — Other Ambulatory Visit (HOSPITAL_COMMUNITY): Payer: Self-pay

## 2022-12-17 ENCOUNTER — Other Ambulatory Visit (HOSPITAL_COMMUNITY): Payer: Self-pay

## 2022-12-18 DIAGNOSIS — E119 Type 2 diabetes mellitus without complications: Secondary | ICD-10-CM | POA: Diagnosis not present

## 2022-12-19 ENCOUNTER — Other Ambulatory Visit: Payer: Self-pay

## 2022-12-20 ENCOUNTER — Other Ambulatory Visit: Payer: Self-pay

## 2022-12-28 ENCOUNTER — Telehealth: Payer: Self-pay | Admitting: Pharmacy Technician

## 2022-12-28 DIAGNOSIS — Z5986 Financial insecurity: Secondary | ICD-10-CM

## 2022-12-28 NOTE — Progress Notes (Signed)
[facility] Customer service manager [facility])                                            [facility] Quality Pharmacy Team    12/28/2022  Wesley Ho Mar 20, 1955 [id number]                                      Medication Assistance Referral  Referral From:  [facility] Asajah Para March  Medication/Company: Rybelsus / [employer] Patient application portion:  Mining engineer portion: Faxed  to Wesley Payor, NP Provider address/fax verified via: Office website  Medication/Company: Lantus / [employer] Patient application portion:  Mining engineer portion: Faxed  to Wesley Payor, NP Provider address/fax verified via: Office website  Pattricia Boss, CPhT [place]  Office: [phone] Fax: [phone] Email: [email or web address]

## 2022-12-31 DIAGNOSIS — E1142 Type 2 diabetes mellitus with diabetic polyneuropathy: Secondary | ICD-10-CM | POA: Diagnosis not present

## 2022-12-31 DIAGNOSIS — M79671 Pain in right foot: Secondary | ICD-10-CM | POA: Diagnosis not present

## 2022-12-31 DIAGNOSIS — M79672 Pain in left foot: Secondary | ICD-10-CM | POA: Diagnosis not present

## 2022-12-31 DIAGNOSIS — B351 Tinea unguium: Secondary | ICD-10-CM | POA: Diagnosis not present

## 2023-01-01 DIAGNOSIS — E119 Type 2 diabetes mellitus without complications: Secondary | ICD-10-CM | POA: Diagnosis not present

## 2023-01-02 ENCOUNTER — Other Ambulatory Visit (HOSPITAL_COMMUNITY): Payer: Self-pay

## 2023-01-02 ENCOUNTER — Other Ambulatory Visit: Payer: Self-pay | Admitting: "Endocrinology

## 2023-01-02 DIAGNOSIS — E1165 Type 2 diabetes mellitus with hyperglycemia: Secondary | ICD-10-CM

## 2023-01-02 MED ORDER — INSULIN PEN NEEDLE 32G X 4 MM MISC
0 refills | Status: DC
  Filled 2023-01-02: qty 100, 100d supply, fill #0

## 2023-01-03 ENCOUNTER — Other Ambulatory Visit (HOSPITAL_COMMUNITY): Payer: Self-pay

## 2023-01-03 ENCOUNTER — Other Ambulatory Visit: Payer: Self-pay

## 2023-01-03 DIAGNOSIS — K7030 Alcoholic cirrhosis of liver without ascites: Secondary | ICD-10-CM | POA: Diagnosis not present

## 2023-01-03 DIAGNOSIS — E1142 Type 2 diabetes mellitus with diabetic polyneuropathy: Secondary | ICD-10-CM | POA: Diagnosis not present

## 2023-01-03 DIAGNOSIS — B351 Tinea unguium: Secondary | ICD-10-CM | POA: Diagnosis not present

## 2023-01-03 DIAGNOSIS — E1169 Type 2 diabetes mellitus with other specified complication: Secondary | ICD-10-CM | POA: Diagnosis not present

## 2023-01-03 DIAGNOSIS — J449 Chronic obstructive pulmonary disease, unspecified: Secondary | ICD-10-CM | POA: Diagnosis not present

## 2023-01-03 DIAGNOSIS — E669 Obesity, unspecified: Secondary | ICD-10-CM | POA: Diagnosis not present

## 2023-01-03 DIAGNOSIS — Z794 Long term (current) use of insulin: Secondary | ICD-10-CM | POA: Diagnosis not present

## 2023-01-03 DIAGNOSIS — I110 Hypertensive heart disease with heart failure: Secondary | ICD-10-CM | POA: Diagnosis not present

## 2023-01-03 DIAGNOSIS — I509 Heart failure, unspecified: Secondary | ICD-10-CM | POA: Diagnosis not present

## 2023-01-03 DIAGNOSIS — F3341 Major depressive disorder, recurrent, in partial remission: Secondary | ICD-10-CM | POA: Diagnosis not present

## 2023-01-03 DIAGNOSIS — E261 Secondary hyperaldosteronism: Secondary | ICD-10-CM | POA: Diagnosis not present

## 2023-01-03 DIAGNOSIS — E1165 Type 2 diabetes mellitus with hyperglycemia: Secondary | ICD-10-CM | POA: Diagnosis not present

## 2023-01-11 ENCOUNTER — Other Ambulatory Visit: Payer: Self-pay

## 2023-01-15 DIAGNOSIS — E119 Type 2 diabetes mellitus without complications: Secondary | ICD-10-CM | POA: Diagnosis not present

## 2023-01-17 ENCOUNTER — Other Ambulatory Visit: Payer: Self-pay

## 2023-01-17 ENCOUNTER — Other Ambulatory Visit (HOSPITAL_COMMUNITY): Payer: Self-pay

## 2023-01-21 ENCOUNTER — Telehealth: Payer: Self-pay | Admitting: "Endocrinology

## 2023-01-21 NOTE — Telephone Encounter (Signed)
Patient said that he needs a refill on his Ryblesus at [facility]. But then he said it's patient assistance. Do you know?

## 2023-01-22 ENCOUNTER — Other Ambulatory Visit (HOSPITAL_BASED_OUTPATIENT_CLINIC_OR_DEPARTMENT_OTHER): Payer: Self-pay

## 2023-01-23 ENCOUNTER — Other Ambulatory Visit (HOSPITAL_COMMUNITY): Payer: Self-pay

## 2023-01-23 ENCOUNTER — Other Ambulatory Visit: Payer: Self-pay

## 2023-01-23 MED ORDER — RYBELSUS 7 MG PO TABS
7.0000 mg | ORAL_TABLET | Freq: Every day | ORAL | 1 refills | Status: DC
  Filled 2023-01-23 – 2023-02-18 (×3): qty 90, 90d supply, fill #0

## 2023-01-23 NOTE — Telephone Encounter (Signed)
Sent refill to [facility]. Pt has not filled out pt assist with [place].

## 2023-01-24 ENCOUNTER — Telehealth: Payer: Self-pay | Admitting: Pharmacist

## 2023-01-24 NOTE — Telephone Encounter (Signed)
Attempted to reach out to patient to verify how he would prefer to receive Rybelsus since he is receiving medications from [facility] in adherence packaging. Likely will need ot keep Rybelsus out of adherence packaging since he is getting from medication assistance program / [employer].  I was unable to reach patient and there was no VM to leave a  message.   Forwarding to [facility] pharmacist that has been working with Dr Scharlene Gloss patients.

## 2023-01-24 NOTE — Telephone Encounter (Signed)
-----   Message from Tona Sensing Simcox sent at 01/24/2023  8:51 AM EST ----- Regarding: RE: Rybelsus PAP Program [employer]!  Per information we received from [employer], patient was approved into [employer] patient assistance foundation for 2024. However, b/c we dont have access to the [employer] records, I can not confirm specifics.  We are in the process of trying to get him approved thru [employer] patient assistance foundation for 2025.  Hope this helps, Pattricia Boss, CPhT [place]  Office: [phone] Fax: [phone] Email: [email or web address] ----- Message ----- From: Henrene Pastor, RPH-CPP Sent: 01/24/2023   8:37 AM EST To: Letta Kocher, CPhT; Stacie Acres Simcox, CPhT; # Subject: FW: Rybelsus PAP Program                       Hi,  I think that you all have worked with Dr Margo Aye. Can you give Dylan some guidance on this? Usually either the patient would have to take this outside of packaging or somehow make it available to the pharmacy to package / have delivered to [employer].  I am assuming the [facility] would likely not want to process and keep up with patient assistance program though.   Nicholson Starace ----- Message ----- From: Letta Kocher, CPhT Sent: 01/23/2023   9:53 AM EST To: Roma Kayser, MD; # Subject: Rybelsus PAP Program                           Good morning,  I am reaching out from [employer]. I just wanted to touch base with you both because we got a prescription in for this patient for Rybelsus and we went to fill it because he gets adherence packaging from [place] but it rejected stating that he gets it from patient assistance. It looks like that information was mailed into [employer] so I am not sure if we are supposed to have the prescription or if it needs to be sent to [facility]. I just wanted to make you aware in case we needed to do anything on our end. Thanks!

## 2023-01-29 ENCOUNTER — Other Ambulatory Visit: Payer: Self-pay

## 2023-01-29 DIAGNOSIS — E119 Type 2 diabetes mellitus without complications: Secondary | ICD-10-CM | POA: Diagnosis not present

## 2023-02-04 ENCOUNTER — Other Ambulatory Visit (HOSPITAL_COMMUNITY): Payer: Self-pay

## 2023-02-04 ENCOUNTER — Other Ambulatory Visit: Payer: Self-pay

## 2023-02-06 ENCOUNTER — Other Ambulatory Visit (HOSPITAL_COMMUNITY): Payer: Self-pay

## 2023-02-06 ENCOUNTER — Other Ambulatory Visit: Payer: Self-pay

## 2023-02-06 DIAGNOSIS — G894 Chronic pain syndrome: Secondary | ICD-10-CM | POA: Diagnosis not present

## 2023-02-06 DIAGNOSIS — E1140 Type 2 diabetes mellitus with diabetic neuropathy, unspecified: Secondary | ICD-10-CM | POA: Diagnosis not present

## 2023-02-06 DIAGNOSIS — K7460 Unspecified cirrhosis of liver: Secondary | ICD-10-CM | POA: Diagnosis not present

## 2023-02-06 MED ORDER — GABAPENTIN 300 MG PO CAPS
300.0000 mg | ORAL_CAPSULE | Freq: Three times a day (TID) | ORAL | 1 refills | Status: DC
  Filled 2023-02-06: qty 90, 30d supply, fill #0
  Filled 2023-02-18 – 2023-03-13 (×2): qty 90, 30d supply, fill #1

## 2023-02-11 ENCOUNTER — Other Ambulatory Visit (HOSPITAL_COMMUNITY): Payer: Self-pay

## 2023-02-12 ENCOUNTER — Other Ambulatory Visit (HOSPITAL_COMMUNITY): Payer: Self-pay

## 2023-02-12 ENCOUNTER — Other Ambulatory Visit: Payer: Self-pay

## 2023-02-12 DIAGNOSIS — E119 Type 2 diabetes mellitus without complications: Secondary | ICD-10-CM | POA: Diagnosis not present

## 2023-02-12 DIAGNOSIS — Z7984 Long term (current) use of oral hypoglycemic drugs: Secondary | ICD-10-CM | POA: Diagnosis not present

## 2023-02-12 MED ORDER — FINASTERIDE 5 MG PO TABS
5.0000 mg | ORAL_TABLET | Freq: Every morning | ORAL | 2 refills | Status: DC
  Filled 2023-02-12: qty 30, 30d supply, fill #0
  Filled 2023-03-11: qty 30, 30d supply, fill #1
  Filled 2023-04-08: qty 30, 30d supply, fill #2

## 2023-02-13 ENCOUNTER — Other Ambulatory Visit (HOSPITAL_COMMUNITY): Payer: Self-pay

## 2023-02-13 ENCOUNTER — Other Ambulatory Visit: Payer: Self-pay

## 2023-02-15 ENCOUNTER — Other Ambulatory Visit: Payer: Self-pay

## 2023-02-18 ENCOUNTER — Other Ambulatory Visit: Payer: Self-pay

## 2023-02-18 ENCOUNTER — Other Ambulatory Visit (HOSPITAL_COMMUNITY): Payer: Self-pay

## 2023-02-19 ENCOUNTER — Other Ambulatory Visit: Payer: Self-pay

## 2023-02-19 ENCOUNTER — Other Ambulatory Visit (HOSPITAL_COMMUNITY): Payer: Self-pay

## 2023-02-19 MED ORDER — PROPRANOLOL HCL 60 MG PO TABS
60.0000 mg | ORAL_TABLET | Freq: Two times a day (BID) | ORAL | 2 refills | Status: DC
  Filled 2023-02-19 – 2023-02-21 (×2): qty 60, 30d supply, fill #0
  Filled 2023-03-08 – 2023-03-13 (×3): qty 60, 30d supply, fill #1
  Filled 2023-03-15 – 2023-03-18 (×4): qty 60, 30d supply, fill #2

## 2023-02-19 MED ORDER — FUROSEMIDE 20 MG PO TABS
20.0000 mg | ORAL_TABLET | Freq: Every day | ORAL | 2 refills | Status: AC | PRN
  Filled 2023-02-19 – 2023-02-21 (×2): qty 30, 30d supply, fill #0

## 2023-02-19 MED ORDER — OMEGA-3-ACID ETHYL ESTERS 1 G PO CAPS
2.0000 | ORAL_CAPSULE | Freq: Every morning | ORAL | 2 refills | Status: DC
  Filled 2023-02-19 – 2023-02-21 (×2): qty 60, 30d supply, fill #0
  Filled 2023-03-08 – 2023-03-13 (×3): qty 60, 30d supply, fill #1
  Filled 2023-03-15 – 2023-03-18 (×4): qty 60, 30d supply, fill #2

## 2023-02-19 MED ORDER — DULOXETINE HCL 30 MG PO CPEP
30.0000 mg | ORAL_CAPSULE | Freq: Two times a day (BID) | ORAL | 2 refills | Status: DC
  Filled 2023-02-19 – 2023-02-21 (×2): qty 60, 30d supply, fill #0
  Filled 2023-03-08 – 2023-03-13 (×3): qty 60, 30d supply, fill #1
  Filled 2023-03-15 – 2023-03-18 (×4): qty 60, 30d supply, fill #2

## 2023-02-19 MED ORDER — MONTELUKAST SODIUM 10 MG PO TABS
10.0000 mg | ORAL_TABLET | Freq: Every morning | ORAL | 2 refills | Status: DC
  Filled 2023-02-19 – 2023-02-21 (×2): qty 30, 30d supply, fill #0
  Filled 2023-03-08 – 2023-03-13 (×3): qty 30, 30d supply, fill #1
  Filled 2023-03-15 – 2023-03-18 (×4): qty 30, 30d supply, fill #2

## 2023-02-19 MED ORDER — CHLORTHALIDONE 25 MG PO TABS
25.0000 mg | ORAL_TABLET | Freq: Every morning | ORAL | 2 refills | Status: DC
  Filled 2023-02-19 – 2023-02-21 (×2): qty 30, 30d supply, fill #0
  Filled 2023-03-08 – 2023-03-13 (×3): qty 30, 30d supply, fill #1
  Filled 2023-03-15 – 2023-03-18 (×4): qty 30, 30d supply, fill #2

## 2023-02-19 MED ORDER — BUSPIRONE HCL 15 MG PO TABS
15.0000 mg | ORAL_TABLET | Freq: Three times a day (TID) | ORAL | 2 refills | Status: DC
  Filled 2023-02-19 – 2023-02-21 (×2): qty 90, 30d supply, fill #0
  Filled 2023-03-08 – 2023-03-13 (×3): qty 90, 30d supply, fill #1
  Filled 2023-03-15 – 2023-03-18 (×4): qty 90, 30d supply, fill #2

## 2023-02-20 ENCOUNTER — Other Ambulatory Visit: Payer: Self-pay

## 2023-02-21 ENCOUNTER — Other Ambulatory Visit: Payer: Self-pay

## 2023-02-21 ENCOUNTER — Other Ambulatory Visit (HOSPITAL_COMMUNITY): Payer: Self-pay

## 2023-02-25 DIAGNOSIS — E1165 Type 2 diabetes mellitus with hyperglycemia: Secondary | ICD-10-CM | POA: Diagnosis not present

## 2023-02-28 ENCOUNTER — Other Ambulatory Visit (HOSPITAL_COMMUNITY): Payer: Self-pay

## 2023-02-28 ENCOUNTER — Other Ambulatory Visit: Payer: Self-pay

## 2023-03-08 ENCOUNTER — Other Ambulatory Visit: Payer: Self-pay

## 2023-03-11 ENCOUNTER — Other Ambulatory Visit: Payer: Self-pay

## 2023-03-11 DIAGNOSIS — M79671 Pain in right foot: Secondary | ICD-10-CM | POA: Diagnosis not present

## 2023-03-11 DIAGNOSIS — M79672 Pain in left foot: Secondary | ICD-10-CM | POA: Diagnosis not present

## 2023-03-11 DIAGNOSIS — B351 Tinea unguium: Secondary | ICD-10-CM | POA: Diagnosis not present

## 2023-03-11 DIAGNOSIS — E1142 Type 2 diabetes mellitus with diabetic polyneuropathy: Secondary | ICD-10-CM | POA: Diagnosis not present

## 2023-03-12 ENCOUNTER — Other Ambulatory Visit (HOSPITAL_COMMUNITY): Payer: Self-pay

## 2023-03-12 DIAGNOSIS — E782 Mixed hyperlipidemia: Secondary | ICD-10-CM | POA: Diagnosis not present

## 2023-03-12 DIAGNOSIS — E119 Type 2 diabetes mellitus without complications: Secondary | ICD-10-CM | POA: Diagnosis not present

## 2023-03-12 DIAGNOSIS — E1100 Type 2 diabetes mellitus with hyperosmolarity without nonketotic hyperglycemic-hyperosmolar coma (NKHHC): Secondary | ICD-10-CM | POA: Diagnosis not present

## 2023-03-14 ENCOUNTER — Other Ambulatory Visit: Payer: Self-pay

## 2023-03-15 ENCOUNTER — Other Ambulatory Visit: Payer: Self-pay

## 2023-03-15 ENCOUNTER — Other Ambulatory Visit (HOSPITAL_COMMUNITY): Payer: Self-pay

## 2023-03-18 ENCOUNTER — Other Ambulatory Visit (HOSPITAL_COMMUNITY): Payer: Self-pay

## 2023-03-18 ENCOUNTER — Other Ambulatory Visit: Payer: Self-pay

## 2023-03-18 DIAGNOSIS — E782 Mixed hyperlipidemia: Secondary | ICD-10-CM | POA: Diagnosis not present

## 2023-03-18 DIAGNOSIS — J309 Allergic rhinitis, unspecified: Secondary | ICD-10-CM | POA: Diagnosis not present

## 2023-03-18 DIAGNOSIS — K222 Esophageal obstruction: Secondary | ICD-10-CM | POA: Diagnosis not present

## 2023-03-18 DIAGNOSIS — E875 Hyperkalemia: Secondary | ICD-10-CM | POA: Diagnosis not present

## 2023-03-18 DIAGNOSIS — G629 Polyneuropathy, unspecified: Secondary | ICD-10-CM | POA: Diagnosis not present

## 2023-03-18 DIAGNOSIS — J439 Emphysema, unspecified: Secondary | ICD-10-CM | POA: Diagnosis not present

## 2023-03-18 DIAGNOSIS — K7030 Alcoholic cirrhosis of liver without ascites: Secondary | ICD-10-CM | POA: Diagnosis not present

## 2023-03-18 DIAGNOSIS — E1100 Type 2 diabetes mellitus with hyperosmolarity without nonketotic hyperglycemic-hyperosmolar coma (NKHHC): Secondary | ICD-10-CM | POA: Diagnosis not present

## 2023-03-18 DIAGNOSIS — Z113 Encounter for screening for infections with a predominantly sexual mode of transmission: Secondary | ICD-10-CM | POA: Diagnosis not present

## 2023-03-18 DIAGNOSIS — E669 Obesity, unspecified: Secondary | ICD-10-CM | POA: Diagnosis not present

## 2023-03-18 DIAGNOSIS — E1310 Other specified diabetes mellitus with ketoacidosis without coma: Secondary | ICD-10-CM | POA: Diagnosis not present

## 2023-03-18 DIAGNOSIS — I10 Essential (primary) hypertension: Secondary | ICD-10-CM | POA: Diagnosis not present

## 2023-03-18 DIAGNOSIS — E1110 Type 2 diabetes mellitus with ketoacidosis without coma: Secondary | ICD-10-CM | POA: Diagnosis not present

## 2023-03-19 ENCOUNTER — Other Ambulatory Visit: Payer: Self-pay

## 2023-03-19 ENCOUNTER — Other Ambulatory Visit (HOSPITAL_COMMUNITY): Payer: Self-pay

## 2023-03-19 ENCOUNTER — Other Ambulatory Visit: Payer: Self-pay | Admitting: "Endocrinology

## 2023-03-19 DIAGNOSIS — E1165 Type 2 diabetes mellitus with hyperglycemia: Secondary | ICD-10-CM

## 2023-03-19 MED ORDER — LANTUS SOLOSTAR 100 UNIT/ML ~~LOC~~ SOPN
60.0000 [IU] | PEN_INJECTOR | Freq: Every day | SUBCUTANEOUS | 1 refills | Status: DC
  Filled 2023-03-19: qty 18, 30d supply, fill #0
  Filled 2023-04-22: qty 18, 30d supply, fill #1

## 2023-03-20 ENCOUNTER — Other Ambulatory Visit (HOSPITAL_COMMUNITY): Payer: Self-pay | Admitting: Pharmacy Technician

## 2023-03-20 ENCOUNTER — Encounter (HOSPITAL_COMMUNITY): Payer: Self-pay

## 2023-03-20 ENCOUNTER — Other Ambulatory Visit (HOSPITAL_COMMUNITY): Payer: Self-pay

## 2023-03-20 ENCOUNTER — Other Ambulatory Visit: Payer: Self-pay

## 2023-03-20 LAB — LAB REPORT - SCANNED
A1c: 8.2
Albumin, Urine POC: 6
Calcium: 9.6
Chlamydia, Swab/Urine, PCR: NEGATIVE
Creatinine, POC: 12 mg/dL
Creatinine, POC: 51.6 mg/dL
EGFR: 94

## 2023-03-20 MED ORDER — BREZTRI AEROSPHERE 160-9-4.8 MCG/ACT IN AERO
2.0000 | INHALATION_SPRAY | Freq: Two times a day (BID) | RESPIRATORY_TRACT | 11 refills | Status: AC
  Filled 2023-03-20: qty 10.7, 30d supply, fill #0

## 2023-03-20 MED ORDER — EMTRICITABINE-TENOFOVIR DF 200-300 MG PO TABS
ORAL_TABLET | ORAL | 3 refills | Status: DC
  Filled 2023-03-20: qty 90, 90d supply, fill #0
  Filled 2023-03-21 – 2023-03-27 (×2): qty 30, 30d supply, fill #0
  Filled 2023-04-15 (×2): qty 30, 30d supply, fill #1
  Filled 2023-05-14 (×2): qty 30, 30d supply, fill #2
  Filled 2023-06-10: qty 30, 30d supply, fill #3
  Filled 2023-07-12: qty 30, 30d supply, fill #4
  Filled 2023-08-06: qty 30, 30d supply, fill #5
  Filled 2023-09-04: qty 30, 30d supply, fill #6
  Filled 2023-10-07 (×2): qty 30, 30d supply, fill #7
  Filled 2023-11-05: qty 30, 30d supply, fill #8
  Filled 2023-12-03 (×2): qty 30, 30d supply, fill #9
  Filled 2023-12-26: qty 30, 30d supply, fill #10
  Filled 2024-01-08 – 2024-02-03 (×4): qty 30, 30d supply, fill #11

## 2023-03-20 MED ORDER — ALBUTEROL SULFATE HFA 108 (90 BASE) MCG/ACT IN AERS
INHALATION_SPRAY | RESPIRATORY_TRACT | 0 refills | Status: AC
  Filled 2023-03-20: qty 6.7, 30d supply, fill #0

## 2023-03-21 ENCOUNTER — Other Ambulatory Visit: Payer: Self-pay

## 2023-03-21 ENCOUNTER — Other Ambulatory Visit (HOSPITAL_COMMUNITY): Payer: Self-pay

## 2023-03-21 DIAGNOSIS — R296 Repeated falls: Secondary | ICD-10-CM | POA: Diagnosis not present

## 2023-03-26 DIAGNOSIS — E1165 Type 2 diabetes mellitus with hyperglycemia: Secondary | ICD-10-CM | POA: Diagnosis not present

## 2023-03-26 DIAGNOSIS — E119 Type 2 diabetes mellitus without complications: Secondary | ICD-10-CM | POA: Diagnosis not present

## 2023-03-27 ENCOUNTER — Encounter (HOSPITAL_COMMUNITY): Payer: Self-pay

## 2023-03-27 ENCOUNTER — Other Ambulatory Visit: Payer: Self-pay

## 2023-03-27 NOTE — Progress Notes (Signed)
Specialty Pharmacy Initiation Note   Wesley Ho is a 68 y.o. male who will be followed by the specialty pharmacy service for RxSp HIV PrEP    Review of administration, indication, effectiveness, safety, potential side effects, storage/disposable, and missed dose instructions occurred today for patient's specialty medication(s) Emtricitabine-Tenofovir DF (TRUVADA)     Patient/Caregiver did not have any additional questions or concerns.   Patient's therapy is appropriate to: Initiate    Goals Addressed             This Visit's Progress    Achieve Undetectable HIV Viral Load < 20       Patient is initiating therapy. Patient will be evaluated at upcoming provider appointment to assess progress         Bobette Mo Specialty Pharmacist

## 2023-03-27 NOTE — Progress Notes (Signed)
Specialty Pharmacy Initial Fill Coordination Note  Wesley Ho is a 68 y.o. male contacted today regarding initial fill of specialty medication(s) Emtricitabine-Tenofovir DF (TRUVADA)   Patient requested Delivery   Delivery date: 03/29/23   Verified address: No data recorded  Medication will be filled on 03/28/23.   Patient is aware of $0.00 copayment.

## 2023-03-28 ENCOUNTER — Other Ambulatory Visit (HOSPITAL_COMMUNITY): Payer: Self-pay

## 2023-03-28 ENCOUNTER — Other Ambulatory Visit: Payer: Self-pay

## 2023-03-28 DIAGNOSIS — Z2981 Encounter for HIV pre-exposure prophylaxis: Secondary | ICD-10-CM | POA: Diagnosis not present

## 2023-03-28 MED ORDER — DOXYCYCLINE HYCLATE 100 MG PO TABS
200.0000 mg | ORAL_TABLET | Freq: Every day | ORAL | 4 refills | Status: AC | PRN
  Filled 2023-03-28: qty 2, 1d supply, fill #0

## 2023-03-29 ENCOUNTER — Other Ambulatory Visit (HOSPITAL_COMMUNITY): Payer: Self-pay

## 2023-04-01 ENCOUNTER — Other Ambulatory Visit: Payer: Self-pay

## 2023-04-01 ENCOUNTER — Other Ambulatory Visit (HOSPITAL_COMMUNITY): Payer: Self-pay

## 2023-04-01 MED ORDER — DOXYCYCLINE HYCLATE 100 MG PO TABS
200.0000 mg | ORAL_TABLET | ORAL | 4 refills | Status: AC | PRN
  Filled 2023-04-01: qty 30, 15d supply, fill #0
  Filled 2023-09-16: qty 30, 15d supply, fill #1
  Filled 2023-10-29: qty 30, 15d supply, fill #2

## 2023-04-02 ENCOUNTER — Encounter: Payer: Self-pay | Admitting: "Endocrinology

## 2023-04-02 ENCOUNTER — Ambulatory Visit: Payer: Medicare HMO | Admitting: "Endocrinology

## 2023-04-02 ENCOUNTER — Encounter: Payer: Self-pay | Admitting: Nutrition

## 2023-04-02 ENCOUNTER — Encounter: Payer: PPO | Attending: Internal Medicine | Admitting: Nutrition

## 2023-04-02 VITALS — Ht 68.0 in | Wt 234.0 lb

## 2023-04-02 VITALS — BP 96/74 | HR 72 | Ht 68.0 in | Wt 234.0 lb

## 2023-04-02 DIAGNOSIS — E66812 Obesity, class 2: Secondary | ICD-10-CM | POA: Insufficient documentation

## 2023-04-02 DIAGNOSIS — E66811 Obesity, class 1: Secondary | ICD-10-CM

## 2023-04-02 DIAGNOSIS — Z794 Long term (current) use of insulin: Secondary | ICD-10-CM | POA: Diagnosis not present

## 2023-04-02 DIAGNOSIS — E782 Mixed hyperlipidemia: Secondary | ICD-10-CM

## 2023-04-02 DIAGNOSIS — I10 Essential (primary) hypertension: Secondary | ICD-10-CM | POA: Insufficient documentation

## 2023-04-02 DIAGNOSIS — E1165 Type 2 diabetes mellitus with hyperglycemia: Secondary | ICD-10-CM

## 2023-04-02 DIAGNOSIS — E6609 Other obesity due to excess calories: Secondary | ICD-10-CM

## 2023-04-02 DIAGNOSIS — Z6834 Body mass index (BMI) 34.0-34.9, adult: Secondary | ICD-10-CM | POA: Diagnosis not present

## 2023-04-02 DIAGNOSIS — E0810 Diabetes mellitus due to underlying condition with ketoacidosis without coma: Secondary | ICD-10-CM | POA: Diagnosis not present

## 2023-04-02 MED ORDER — RYBELSUS 7 MG PO TABS: 7.0000 mg | ORAL_TABLET | Freq: Every day | ORAL | Status: DC

## 2023-04-02 NOTE — Progress Notes (Signed)
Medical Nutrition Therapy  Appointment Start time:  1030 Appointment End time:  1100 Primary concerns today: DM Type 2  Referral diagnosis: E11.8 Preferred learning style: Visual  Learning readiness: Ready   NUTRITION ASSESSMENT Follow up DM  Saw Dr. Fransico Him. A1C 8.2% , up From 7.4%. He had stopped taking his Rybelsus by mistake. He had forgot about it. Metformin 1000 mg BID. Has been eating a lot of chocolate candy bars and drinking chocolate milk. Drinks propels. Doesn't cook. Eats out at [employer]. Willing to try to buy some healthy choice meals in place of eating out as much.  Gained 10 lbs. Libre Shows Est A1C 7.9%. Avg Glu 191 mg/dl. 48% TIR, 37% TAR, 15% Very high above range.   Wt Readings from Last 3 Encounters:  04/02/23 234 lb (106.1 kg)  11/26/22 224 lb (101.6 kg)  11/26/22 224 lb 12.8 oz (102 kg)   Ht Readings from Last 3 Encounters:  04/02/23 5\' 8"  (1.727 m)  11/26/22 5\' 8"  (1.727 m)  11/26/22 5\' 8"  (1.727 m)   There is no height or weight on file to calculate BMI. @BMIFA @ Facility age limit for growth %iles is 20 years. Facility age limit for growth %iles is 20 years.    Clinical Medical Hx: DM Type 2, Obesity, Cirrhosis of liver, GERD, HTN, Hyperlipidemia, depression and anxiety Medications: Rybelsus,  60 Lantus down from 70,  Labs:        Latest Ref Rng & Units 03/20/2023    3:28 PM 11/09/2022   12:00 AM 05/07/2022    1:39 PM  CMP  Glucose 65 - 99 mg/dL   161   BUN 4 - 21  11     20    Creatinine 0.6 - 1.3  0.9     0.84   Sodium 137 - 147  138     137   Potassium 3.5 - 5.1 mEq/L  5.1     4.5   Chloride 99 - 108  95     94   CO2 13 - 22  29     32   Calcium  9.6     9.9     9.8   Total Protein 6.1 - 8.1 g/dL   8.0   Total Bilirubin 0.2 - 1.2 mg/dL   0.5   Alkaline Phos 25 - 125  85       AST 14 - 40  20     22   ALT 10 - 40 U/L  16     25      This result is from an external source.    Notable Signs/Symptoms: Chronic fatigue, neuropathy in feet,  increased thirst, blurry vision  Lifestyle & Dietary Hx Lives by himself  Estimated daily fluid intake: 60 oz Supplements: See chart Sleep: a lot Stress / self-care: his health Current average weekly physical activity: ADL due to neuropathy  24-Hr Dietary Recall Eats 1 meal per day and snacks on candy or chocolate milk Drinks propels. Eats at [facility] for brunch most days. Doesn't cook.   Estimated Energy Needs Calories: 1600 Carbohydrate: 180g Protein: 120g Fat: 44g   NUTRITION DIAGNOSIS  NB-1.1 Food and nutrition-related knowledge deficit As related to Excessive carb, fat and salt intake.  As evidenced by DM Type 2, A1C 13.2%, Obesity.   NUTRITION INTERVENTION  Nutrition education (E-1) on the following topics:  Nutrition and Diabetes education provided on My Plate, CHO counting, meal planning, portion sizes, timing of meals, avoiding snacks  between meals unless having a low blood sugar, target ranges for A1C and blood sugars, signs/symptoms and treatment of hyper/hypoglycemia, monitoring blood sugars, taking medications as prescribed, benefits of exercising 30 minutes per day and prevention of complications of DM.  Lifestyle Medicine  - Whole Food, Plant Predominant Nutrition is highly recommended: Eat Plenty of vegetables, Mushrooms, fruits, Legumes, Whole Grains, Nuts, seeds in lieu of processed meats, processed snacks/pastries red meat, poultry, eggs.    -It is better to avoid simple carbohydrates including: Cakes, Sweet Desserts, Ice Cream, Soda (diet and regular), Sweet Tea, Candies, Chips, Cookies, Store Bought Juices, Alcohol in Excess of  1-2 drinks a day, Lemonade,  Artificial Sweeteners, Doughnuts, Coffee Creamers, "Sugar-free" Products, etc, etc.  This is not a complete list.....  Exercise: If you are able: 30 -60 minutes a day ,4 days a week, or 150 minutes a week.  The longer the better.  Combine stretch, strength, and aerobic activities.  If you were told in the  past that you have high risk for cardiovascular diseases, you may seek evaluation by your heart doctor prior to initiating moderate to intense exercise programs.   Handouts Provided Include     Learning Style & Readiness for Change Teaching method utilized: Visual & Auditory  Demonstrated degree of understanding via: Teach Back  Barriers to learning/adherence to lifestyle change: None  Goals Established by Pt  Try to buy some healthy choice  meals at [facility] to eat daily. Eliminate eating the candy bars. Take Rybelsus daily. Drink 3 bottles of water per day and drink less propel. Eat 3 meals per day  MONITORING & EVALUATION Dietary intake, weekly physical activity, and blood sugars in 3 month.  Next Steps  Patient is to work on consistent meal planning and whole plant based foods.Marland Kitchen

## 2023-04-02 NOTE — Progress Notes (Signed)
04/02/2023, 11:26 AM  Endocrinology follow-up note   Subjective:    Patient ID: Wesley Ho, male    DOB: 05-08-1955.  Wesley Ho is being seen in follow-up after he was seen in consultation for management of currently uncontrolled symptomatic diabetes requested by  Benita Stabile, MD.   Past Medical History:  Diagnosis Date   Anxiety    Arthritis    Bronchitis    Chronic diarrhea    Chronic edema of lower extremity 09/28/2010   COPD (chronic obstructive pulmonary disease) (HCC)    Diabetes mellitus without complication (HCC)    Diabetes mellitus, type II (HCC)    DVT (deep venous thrombosis) (HCC) 09/28/2010   Emphysema of lung (HCC)    Fatigue    H/O Thrombocytopenia 09/28/2010   Hypertension    Left radial fracture    Multiple lung nodules 09/28/2010   Neuropathy     Past Surgical History:  Procedure Laterality Date   BIOPSY  05/29/2022   Procedure: BIOPSY;  Surgeon: Dolores Frame, MD;  [facility]: [facility];  Service: Gastroenterology;;   CATARACT EXTRACTION W/PHACO Right 01/17/2016   Procedure: CATARACT EXTRACTION PHACO AND INTRAOCULAR LENS PLACEMENT (IOC);  Surgeon: Jethro Bolus, MD;  Location: [facility];  Service: Ophthalmology;  Laterality: Right;  CDE: 4.34   CATARACT EXTRACTION W/PHACO Left 01/31/2016   Procedure: CATARACT EXTRACTION PHACO AND INTRAOCULAR LENS PLACEMENT (IOC);  Surgeon: Jethro Bolus, MD;  Location: [facility];  Service: Ophthalmology;  Laterality: Left;  CDE: 5.69   CHOLECYSTECTOMY     ESOPHAGEAL DILATION N/A 05/29/2022   Procedure: ESOPHAGEAL DILATION;  Surgeon: Dolores Frame, MD;  [facility];  Service: Gastroenterology;  Laterality: N/A;   ESOPHAGOGASTRODUODENOSCOPY (EGD) WITH PROPOFOL N/A 05/29/2022   Procedure: ESOPHAGOGASTRODUODENOSCOPY (EGD) WITH PROPOFOL;  Surgeon: Dolores Frame, MD;  [facility];  Service: Gastroenterology;  Laterality: N/A;  10:30 am, asa 3   MOUTH SURGERY      Social History   Socioeconomic History   Marital status: Divorced    Spouse name: Not on file   Number of children: 2   Years of education: Not on file   Highest education level: Not on file  Occupational History   Occupation: Retired  Tobacco Use   Smoking status: Former    Current packs/day: 0.00    Average packs/day: 1 pack/day for 40.0 years (40.0 ttl pk-yrs)    Types: Cigarettes    Start date: 01/11/1973    Quit date: 01/11/2013    Years since quitting: 10.2   Smokeless tobacco: Never  Vaping Use   Vaping status: Never Used  Substance and Sexual Activity   Alcohol use: No    Comment: stopped drinking 2021   Drug use: No   Sexual activity: Not Currently    Birth control/protection: None  Other Topics Concern   Not on file  Social History Narrative   Not on file   Social Drivers of Health   Financial Resource Strain: Not on file  Food Insecurity: No Food Insecurity (04/03/2022)   Hunger Vital Sign  Worried About Programme researcher, broadcasting/film/video in the Last Year: Never true    Ran Out of Food in the Last Year: Never true  Transportation Needs: No Transportation Needs (04/03/2022)   PRAPARE - Administrator, Civil Service (Medical): No    Lack of Transportation (Non-Medical): No  Physical Activity: Not on file  Stress: Not on file  Social Connections: Not on file    Family History  Problem Relation Age of Onset   Diabetes Mother    Clotting disorder Father    Alcohol abuse Maternal Uncle    Lung cancer Maternal Grandfather    Emphysema Paternal Grandfather     Outpatient Encounter Medications as of 04/02/2023  Medication Sig   acetaminophen (TYLENOL) 500 MG tablet Take 500-1,000 mg by mouth every 6 (six) hours as needed for mild pain.   albuterol (VENTOLIN HFA) 108 (90 Base) MCG/ACT inhaler Inhale 1 puff into the lungs by mouth every 4 - 6 hours as needed    ALPRAZolam (XANAX) 1 MG tablet Take 1 mg by mouth every 6 (six) hours as needed for anxiety.   ALPRAZolam (XANAX) 1 MG tablet Take 1 tablet (1 mg total) by mouth every 6 (six) hours as needed.   ALPRAZolam (XANAX) 1 MG tablet Take 1 tablet (1 mg total) by mouth every 6 (six) hours as needed for anxiety   ALPRAZolam (XANAX) 1 MG tablet TAKE ONE TABLET BY MOUTH EVERY 6 HOURS AS NEEDED FOR ANXIETY   atorvastatin (LIPITOR) 80 MG tablet Take 1 tablet (80 mg total) by mouth daily.   atorvastatin (LIPITOR) 80 MG tablet Take 1 tablet (80 mg total) by mouth daily.   Blood Glucose Monitoring Suppl DEVI 1 each by Does not apply route in the morning, at noon, and at bedtime. May substitute to any manufacturer covered by patient's insurance.   Budeson-Glycopyrrol-Formoterol (BREZTRI AEROSPHERE) 160-9-4.8 MCG/ACT AERO Inhale 2 puffs into the lungs 2 (two) times daily.   busPIRone (BUSPAR) 15 MG tablet Take 1 tablet (15 mg total) by mouth 3 (three) times daily. (Patient taking differently: Take 15 mg by mouth 2 (two) times daily.)   busPIRone (BUSPAR) 15 MG tablet Take 1 tablet (15 mg total) by mouth 3 (three) times daily.   carboxymethylcellulose (REFRESH TEARS) 0.5 % SOLN Place 1 drop into both eyes 2 (two) times daily as needed (dry eyes).   chlorthalidone (HYGROTON) 25 MG tablet Take 25 mg by mouth every morning.   chlorthalidone (HYGROTON) 25 MG tablet Take 1 tablet (25 mg total) by mouth in the morning.   Continuous Glucose Sensor (FREESTYLE LIBRE 3 SENSOR) MISC Place 1 sensor on the skin every 14 days. Use to check glucose continuously   Continuous Glucose Sensor (FREESTYLE LIBRE 3 SENSOR) MISC Use as directed every 14 (fourteen) days.   Cyanocobalamin 2500 MCG TABS Take 2,500 mcg by mouth daily.   doxycycline (VIBRA-TABS) 100 MG tablet Take 2 tablets (200 mg total) by mouth daily as needed.   doxycycline (VIBRA-TABS) 100 MG tablet Take 2 tablets (200 mg total) by mouth as needed.   DULoxetine (CYMBALTA)  30 MG capsule Take 30 mg by mouth 2 (two) times daily.   DULoxetine (CYMBALTA) 30 MG capsule Take 1 capsule (30 mg total) by mouth 2 (two) times daily.   emtricitabine-tenofovir (TRUVADA) 200-300 MG tablet Take 1 tablet every day by oral route for 90 days.   finasteride (PROSCAR) 5 MG tablet Take 5 mg by mouth daily.   finasteride (PROSCAR) 5 MG  tablet Take 1 tablet (5 mg total) by mouth in the morning.   furosemide (LASIX) 20 MG tablet Take 20 mg by mouth daily.   furosemide (LASIX) 20 MG tablet Take 1 tablet (20 mg total) by mouth daily as needed for swelling.   gabapentin (NEURONTIN) 300 MG capsule Take 1 capsule (300 mg total) by mouth 3 (three) times daily. Start with 1 capsule at night for 5 days, then 1 capsules twice daily for 5 days, then 1 capsule 3 times dialy as tolerated   insulin glargine (LANTUS SOLOSTAR) 100 UNIT/ML Solostar Pen Inject 60 Units into the skin at bedtime.   Insulin Pen Needle 32G X 4 MM MISC Use to inject insulin daily   lactulose (CHRONULAC) 10 GM/15ML solution Take 30 mLs (20 g total) by mouth 2 (two) times daily.   levocetirizine (XYZAL) 5 MG tablet Take 5 mg by mouth every morning.   levocetirizine (XYZAL) 5 MG tablet Take 1 tablet (5 mg total) by mouth every morning.   lidocaine-prilocaine (EMLA) cream Apply 1 Application topically daily as needed (pain).   metFORMIN (GLUCOPHAGE) 1000 MG tablet Take 1 tablet (1,000 mg total) by mouth 2 (two) times daily with a meal.   montelukast (SINGULAIR) 10 MG tablet Take 10 mg by mouth daily.   montelukast (SINGULAIR) 10 MG tablet Take 1 tablet (10 mg total) by mouth in the morning for breathing.   omega-3 acid ethyl esters (LOVAZA) 1 g capsule Take 2 g by mouth daily.   omega-3 acid ethyl esters (LOVAZA) 1 g capsule Take 2 capsules (2 g total) by mouth in the morning.   omeprazole (PRILOSEC) 40 MG capsule Take 1 capsule (40 mg total) by mouth 2 (two) times daily.   potassium chloride SA (KLOR-CON M) 20 MEQ tablet Take 1  tablet (20 mEq total) by mouth daily for 5 days. (Patient not taking: Reported on 05/23/2022)   pregabalin (LYRICA) 200 MG capsule Take 200 mg by mouth 2 (two) times daily.   propranolol (INDERAL) 60 MG tablet Take 60 mg by mouth 2 (two) times daily.   propranolol (INDERAL) 60 MG tablet Take 1 tablet (60 mg total) by mouth 2 (two) times daily.   Semaglutide (RYBELSUS) 7 MG TABS Take 1 tablet (7 mg total) by mouth daily.   sucralfate (CARAFATE) 1 GM/10ML suspension Take 10 mLs (1 g total) by mouth 4 (four) times daily -  with meals and at bedtime. (Patient not taking: Reported on 08/13/2022)   [DISCONTINUED] metFORMIN (GLUCOPHAGE) 1000 MG tablet Take 1 tablet (1,000 mg total) by mouth 2 (two) times daily.   [DISCONTINUED] Semaglutide (RYBELSUS) 7 MG TABS Take 1 tablet (7 mg total) by mouth daily. (Patient not taking: Reported on 04/02/2023)   No facility-administered encounter medications on file as of 04/02/2023.    ALLERGIES: No Known Allergies  VACCINATION STATUS: Immunization History  Administered Date(s) Administered   Fluad Quad(high Dose 65+) 12/21/2019   Influenza-Unspecified 11/03/2017   Tdap 09/02/2019    Diabetes He presents for his follow-up diabetic visit. He has type 2 diabetes mellitus. Onset time: Patient reports that he was not aware of having diabetes and did he went to the hospital with severe hyperglycemia leading to diabetes ketoacidosis recently.  He did have A1c of 6.1% consistent with prediabetes in 2016. His disease course has been worsening. There are no hypoglycemic associated symptoms. Pertinent negatives for hypoglycemia include no confusion, headaches, pallor or seizures. Associated symptoms include polydipsia and polyuria. Pertinent negatives for diabetes include no  chest pain, no fatigue, no polyphagia and no weakness. There are no hypoglycemic complications. Symptoms are worsening. Risk factors for coronary artery disease include diabetes mellitus, dyslipidemia,  hypertension, male sex, tobacco exposure, sedentary lifestyle, family history and obesity. His weight is fluctuating minimally. He is following a generally unhealthy diet. When asked about meal planning, he reported none. He has not had a previous visit with a dietitian. He never participates in exercise. His home blood glucose trend is increasing steadily. His breakfast blood glucose range is generally 180-200 mg/dl. His lunch blood glucose range is generally 180-200 mg/dl. His dinner blood glucose range is generally 180-200 mg/dl. His bedtime blood glucose range is generally 180-200 mg/dl. His overall blood glucose range is 180-200 mg/dl. (He uses his CGM on his phone.  His average blood glucose is 191 mg per DL for the last 14 days, AGP report shows 48% time in range, 37% level 1 hyperglycemia, 50% level 2 hyperglycemia.  His previsit labs show A1c of 8.2% increasing from 7.4%.  He did not document hypoglycemia.  For unclear reasons, he did not continue on his Rybelsus for more than 2 months.    ) An ACE inhibitor/angiotensin II receptor blocker is being taken.  Hypertension This is a chronic problem. The current episode started more than 1 year ago. The problem is controlled. Pertinent negatives include no chest pain, headaches, neck pain, palpitations or shortness of breath. Risk factors for coronary artery disease include obesity, male gender, dyslipidemia, diabetes mellitus, smoking/tobacco exposure and sedentary lifestyle. Past treatments include beta blockers and diuretics. Compliance problems: He has a documented history of medical noncompliance.   Hyperlipidemia This is a chronic problem. Exacerbating diseases include diabetes and obesity. Pertinent negatives include no chest pain, myalgias or shortness of breath. Current antihyperlipidemic treatment includes statins. Risk factors for coronary artery disease include dyslipidemia, diabetes mellitus, hypertension, male sex, a sedentary lifestyle,  family history and obesity.     Objective:       04/02/2023   10:33 AM 04/02/2023   10:03 AM 11/26/2022    9:50 AM  Vitals with BMI  Height 5\' 8"  5\' 8"  5\' 8"   Weight 234 lbs 234 lbs 224 lbs  BMI 35.59 35.59 34.07  Systolic  96   Diastolic  74   Pulse  72     BP 96/74   Pulse 72   Ht 5\' 8"  (1.727 m)   Wt 234 lb (106.1 kg)   BMI 35.58 kg/m   Wt Readings from Last 3 Encounters:  04/02/23 234 lb (106.1 kg)  04/02/23 234 lb (106.1 kg)  11/26/22 224 lb (101.6 kg)      CMP ( most recent) CMP     Component Value Date/Time   NA 138 11/09/2022 0000   K 5.1 11/09/2022 0000   CL 95 (A) 11/09/2022 0000   CO2 29 (A) 11/09/2022 0000   GLUCOSE 226 (H) 05/07/2022 1339   BUN 11 11/09/2022 0000   CREATININE 0.9 11/09/2022 0000   CREATININE 0.84 05/07/2022 1339   CALCIUM 9.6 03/20/2023 1528   PROT 8.0 05/07/2022 1339   ALBUMIN 4.2 11/09/2022 0000   AST 20 11/09/2022 0000   ALT 16 11/09/2022 0000   ALKPHOS 85 11/09/2022 0000   BILITOT 0.5 05/07/2022 1339   GFRNONAA >60 04/07/2022 0553   GFRAA >60 09/02/2019 1656     Diabetic Labs (most recent): Lab Results  Component Value Date   HGBA1C 7.4 11/09/2022   HGBA1C 8.4 (A) 08/20/2022   HGBA1C  13.1 (H) 04/03/2022     Lab Results  Component Value Date   TSH 0.904 10/30/2016   TSH 2.280 07/07/2014    Lipid Panel     Component Value Date/Time   CHOL 175 11/09/2022 0000   TRIG 148 11/09/2022 0000   HDL 38 11/09/2022 0000   LDLCALC 111 11/09/2022 0000    Assessment & Plan:   1. Type 2 diabetes mellitus with hyperglycemia, with long-term current use of insulin (HCC)  - Wesley Ho has currently uncontrolled symptomatic type 2 DM since  68 years of age.  He uses his CGM on his phone.  His average blood glucose is 191 mg per DL for the last 14 days, AGP report shows 48% time in range, 37% level 1 hyperglycemia, 50% level 2 hyperglycemia.  His previsit labs show A1c of 8.2% increasing from 7.4%.  He did not document  hypoglycemia.  For unclear reasons, he did not continue on his Rybelsus for more than 2 months.    Recent labs reviewed. - I had a long discussion with him about the possible risk factors and  the pathology behind its diabetes and its complications. -his diabetes is complicated by obesity/sedentary life and he remains at a high risk for more acute and chronic complications which include CAD, CVA, CKD, retinopathy, and neuropathy. These are all discussed in detail with him.  - I discussed all available options of managing his diabetes including de-escalation of medications. I have counseled him on Food as Medicine by adopting a Whole Food , Plant Predominant  ( WFPP) nutrition as recommended by [employer] of Lifestyle Medicine. Patient is encouraged to switch to  unprocessed or minimally processed  complex starch, adequate protein intake (mainly plant source), minimal liquid fat, plenty of fruits, and vegetables. -  he is advised to stick to a routine mealtimes to eat 3 complete meals a day and snack only when necessary ( to snack only to correct hypoglycemia BG <70 day time or <100 at night).   - he acknowledges that there is a room for improvement in his food and drink choices. - Further Specific Suggestion is made for him to avoid simple carbohydrates  from his diet including Cakes, Sweet Desserts, Ice Cream, Soda (diet and regular), Sweet Tea, Candies, Chips, Cookies, Store Bought Juices, Alcohol ,  Artificial Sweeteners,  Coffee Creamer, and "Sugar-free" Products. This will help patient to have more stable blood glucose profile and potentially avoid unintended weight gain.    - he will be scheduled with Norm Salt, RDN, CDE for individualized diabetes education.  - I have approached him with the following individualized plan to manage  his diabetes and patient agrees:   -He is responding to his current regimen of multiple medications.  He is advised to resume his Rybelsus 7 mg p.o.  daily before breakfast.   -He will not be considered for prandial insulin for now.  He tends to skip breakfast a lot, he is advised to continue Lantus 60 units nightly. - he  advised to continue metformin 1000 mg p.o. twice daily.  He is advised to continue to use his CGM continuously.  And he is encouraged to call clinic for hypoglycemia under 70 or hyperglycemia above 200 mg/day.  - Specific targets for  A1c;  LDL, HDL,  and Triglycerides were discussed with the patient.  2) Blood Pressure /Hypertension:  His blood pressure is controlled to target.  he is advised to continue his current medications including chlorthalidone 25  mg p.o. daily, Lasix as needed.   3) Lipids/Hyperlipidemia: His previsit labs show LDL uncontrolled at 111. He reports he is more consistent taking his statin.  He is advised to continue atorvastatin 80 mg p.o. nightly.    Side effects and precautions discussed with him.    Side effects and precautions discussed with him.  4)  Weight/Diet:  Body mass index is 35.58 kg/m.  -   clearly complicating his diabetes care.   he is  a candidate for weight loss. I discussed with him the fact that loss of 5 - 10% of his  current body weight will have the most impact on his diabetes management.  The above detailed  ACLM recommendations for nutrition, exercise, sleep, social life, avoidance of risky substances, the need for restorative sleep   information will also detailed on discharge instructions.  5) Chronic Care/Health Maintenance:  -he  is on Statin medications and  is encouraged to initiate and continue to follow up with Ophthalmology, Dentist,  Podiatrist at least yearly or according to recommendations, and advised to   stay away from smoking. I have recommended yearly flu vaccine and pneumonia vaccine at least every 5 years; moderate intensity exercise for up to 150 minutes weekly; and  sleep for 7- 9 hours a day.  - he is  advised to maintain close follow up with Benita Stabile, MD for primary care needs, as well as his other providers for optimal and coordinated care.   I spent  41  minutes in the care of the patient today including review of labs from CMP, Lipids, Thyroid Function, Hematology (current and previous including abstractions from other facilities); face-to-face time discussing  his blood glucose readings/logs, discussing hypoglycemia and hyperglycemia episodes and symptoms, medications doses, his options of short and long term treatment based on the latest standards of care / guidelines;  discussion about incorporating lifestyle medicine;  and documenting the encounter. Risk reduction counseling performed per USPSTF guidelines to reduce  obesity and cardiovascular risk factors.     Please refer to Patient Instructions for Blood Glucose Monitoring and Insulin/Medications Dosing Guide"  in media tab for additional information. Please  also refer to " Patient Self Inventory" in the Media  tab for reviewed elements of pertinent patient history.  Wesley Ho participated in the discussions, expressed understanding, and voiced agreement with the above plans.  All questions were answered to his satisfaction. he is encouraged to contact clinic should he have any questions or concerns prior to his return visit.    Follow up plan: - Return in about 4 months (around 07/31/2023), or he will get labs from PCP, for Bring Meter/CGM Device/Logs- A1c in Office.  Marquis Lunch, MD [facility] [address] [place], [place] [address] Phone: [phone]  Fax: [phone]    04/02/2023, 11:26 AM  This note was partially dictated with voice recognition software. Similar sounding words can be transcribed inadequately or may not  be corrected upon review.

## 2023-04-02 NOTE — Patient Instructions (Signed)

## 2023-04-02 NOTE — Patient Instructions (Addendum)
Goals  Try to buy some healthy choice  meals at [facility] to eat daily. Eliminate eating the candy bars. Take Rybelsus daily. Drink 3 bottles of water per day and drink less propel. Eat 3 meals per day \

## 2023-04-03 ENCOUNTER — Other Ambulatory Visit: Payer: Self-pay

## 2023-04-03 ENCOUNTER — Other Ambulatory Visit (HOSPITAL_COMMUNITY): Payer: Self-pay

## 2023-04-03 ENCOUNTER — Other Ambulatory Visit (HOSPITAL_BASED_OUTPATIENT_CLINIC_OR_DEPARTMENT_OTHER): Payer: Self-pay

## 2023-04-03 ENCOUNTER — Other Ambulatory Visit: Payer: Self-pay | Admitting: "Endocrinology

## 2023-04-03 DIAGNOSIS — E1165 Type 2 diabetes mellitus with hyperglycemia: Secondary | ICD-10-CM

## 2023-04-03 DIAGNOSIS — G894 Chronic pain syndrome: Secondary | ICD-10-CM | POA: Diagnosis not present

## 2023-04-03 DIAGNOSIS — E1140 Type 2 diabetes mellitus with diabetic neuropathy, unspecified: Secondary | ICD-10-CM | POA: Diagnosis not present

## 2023-04-03 MED ORDER — INSUPEN PEN NEEDLES 32G X 4 MM MISC
2 refills | Status: DC
  Filled 2023-04-03: qty 100, 100d supply, fill #0
  Filled 2023-06-24: qty 100, 100d supply, fill #1
  Filled 2023-09-23: qty 100, 100d supply, fill #2

## 2023-04-04 ENCOUNTER — Other Ambulatory Visit (HOSPITAL_COMMUNITY): Payer: Self-pay

## 2023-04-04 ENCOUNTER — Telehealth: Payer: Self-pay | Admitting: Pharmacy Technician

## 2023-04-04 ENCOUNTER — Other Ambulatory Visit: Payer: Self-pay

## 2023-04-04 DIAGNOSIS — Z5986 Financial insecurity: Secondary | ICD-10-CM

## 2023-04-04 NOTE — Progress Notes (Addendum)
 Pharmacy Medication Assistance Program Note    04/04/2023  Patient ID: Wesley Ho, male   DOB: 05-11-1955, 68 y.o.   MRN: [id number]     04/04/2023  Outreach Medication One  Initial Outreach Date (Medication One) 12/21/2022  Manufacturer Medication One Sanofi  Sanofi Drugs Lantus  Dose of Lantus Solostar 100 units/ml  Type of Radiographer, therapeutic Assistance  Date Application Sent to Patient 12/26/2022  Application Items Requested Proof of Income;Application;Other  Date Application Sent to Prescriber 12/26/2022  Name of Prescriber Leone Payor        04/04/2023  Outreach Medication Two  Initial Outreach Date (Medication Two) 12/21/2022  Manufacturer Medication Two Novo Nordisk  Nordisk Drugs Rybelsus  Dose of Ryblesus 7mg   Type of Radiographer, therapeutic Assistance  Date Application Sent to Patient 12/26/2022  Application Items Requested Application;Proof of Income;Other  Date Application Sent to Prescriber 12/26/2022    Successful outreach to patient in regard to Rybelsus  application with [employer] and Lantus application with [employer]. HIPAA verified. Patient informs he has not received the applications and requested they be  mailed again. Confirmed address is correct in [place]. Patient informs he has Community education officer for 2025.  ADDENDUM 04/10/2023 Application placed in mail to patient today.  ADDENDUM 05/02/2023 Successful outreach to patient in regard to Rybelsus application with [employer] and Lantus application with [employer]. HIPAA verified. Patient is NOT sure if he has received the applications as he has not checked his mail in several days. He informs he will check his mail and return the phone call. Provided patient my direct line. Will await a return call.  ADDENDUM 05/13/2023 Successful outreach call placed to patient in regard to Lantus application with [employer] and Rybelsus application with [employer]. HIPAA verified. Patient informs he received both applications and 2  envelopes. Went over what he needed to do with the applications. Verified patient had [employer] for 2025. He would prefer not to send in income at this time but was informed that if the patient assistance companies were to need it, then he could supply it. Will await return of applications.  Pattricia Boss, CPhT [place]  Office: [phone] Fax: [phone] Email: [email or web address]

## 2023-04-05 ENCOUNTER — Other Ambulatory Visit (HOSPITAL_COMMUNITY): Payer: Self-pay

## 2023-04-05 ENCOUNTER — Other Ambulatory Visit (HOSPITAL_BASED_OUTPATIENT_CLINIC_OR_DEPARTMENT_OTHER): Payer: Self-pay

## 2023-04-05 ENCOUNTER — Other Ambulatory Visit: Payer: Self-pay

## 2023-04-05 MED ORDER — GABAPENTIN 300 MG PO CAPS
ORAL_CAPSULE | ORAL | 2 refills | Status: DC
  Filled 2023-04-05: qty 90, 30d supply, fill #0
  Filled 2023-04-09 – 2023-04-15 (×2): qty 90, 35d supply, fill #0

## 2023-04-05 MED ORDER — OMEGA-3-ACID ETHYL ESTERS 1 G PO CAPS
2.0000 g | ORAL_CAPSULE | Freq: Every morning | ORAL | 2 refills | Status: DC
  Filled 2023-04-05 – 2023-04-09 (×2): qty 60, 30d supply, fill #0
  Filled 2023-04-29 – 2023-05-03 (×3): qty 60, 30d supply, fill #1
  Filled 2023-05-10 – 2023-05-27 (×2): qty 60, 30d supply, fill #2

## 2023-04-05 MED ORDER — CHLORTHALIDONE 25 MG PO TABS
25.0000 mg | ORAL_TABLET | Freq: Every morning | ORAL | 2 refills | Status: DC
  Filled 2023-04-05 – 2023-04-09 (×2): qty 30, 30d supply, fill #0
  Filled 2023-04-29 – 2023-05-03 (×3): qty 30, 30d supply, fill #1
  Filled 2023-05-10 – 2023-05-27 (×2): qty 30, 30d supply, fill #2

## 2023-04-05 MED ORDER — PROPRANOLOL HCL 60 MG PO TABS
60.0000 mg | ORAL_TABLET | Freq: Two times a day (BID) | ORAL | 2 refills | Status: DC
  Filled 2023-04-05 – 2023-04-09 (×2): qty 60, 30d supply, fill #0
  Filled 2023-04-29 – 2023-05-03 (×3): qty 60, 30d supply, fill #1
  Filled 2023-05-10 – 2023-05-27 (×2): qty 60, 30d supply, fill #2

## 2023-04-05 MED ORDER — ATORVASTATIN CALCIUM 80 MG PO TABS
80.0000 mg | ORAL_TABLET | Freq: Every day | ORAL | 2 refills | Status: DC
  Filled 2023-04-05 – 2023-04-09 (×2): qty 30, 30d supply, fill #0
  Filled 2023-04-29 – 2023-05-03 (×3): qty 30, 30d supply, fill #1
  Filled 2023-05-10 – 2023-05-27 (×2): qty 30, 30d supply, fill #2

## 2023-04-05 MED ORDER — DULOXETINE HCL 30 MG PO CPEP
30.0000 mg | ORAL_CAPSULE | Freq: Two times a day (BID) | ORAL | 2 refills | Status: DC
  Filled 2023-04-05 – 2023-04-09 (×2): qty 60, 30d supply, fill #0
  Filled 2023-04-29 – 2023-05-03 (×3): qty 60, 30d supply, fill #1
  Filled 2023-05-10 – 2023-05-27 (×2): qty 60, 30d supply, fill #2

## 2023-04-05 MED ORDER — MONTELUKAST SODIUM 10 MG PO TABS
10.0000 mg | ORAL_TABLET | Freq: Every morning | ORAL | 2 refills | Status: DC
  Filled 2023-04-05 – 2023-04-09 (×2): qty 30, 30d supply, fill #0
  Filled 2023-04-29 – 2023-05-03 (×3): qty 30, 30d supply, fill #1
  Filled 2023-05-10 – 2023-05-27 (×2): qty 30, 30d supply, fill #2

## 2023-04-05 MED ORDER — BUSPIRONE HCL 15 MG PO TABS
15.0000 mg | ORAL_TABLET | Freq: Three times a day (TID) | ORAL | 2 refills | Status: DC
  Filled 2023-04-05 – 2023-04-09 (×2): qty 90, 30d supply, fill #0
  Filled 2023-04-29 – 2023-05-03 (×3): qty 90, 30d supply, fill #1
  Filled 2023-05-10 – 2023-05-27 (×2): qty 90, 30d supply, fill #2

## 2023-04-08 ENCOUNTER — Other Ambulatory Visit (HOSPITAL_COMMUNITY): Payer: Self-pay

## 2023-04-08 ENCOUNTER — Other Ambulatory Visit: Payer: Self-pay

## 2023-04-09 ENCOUNTER — Other Ambulatory Visit (HOSPITAL_COMMUNITY): Payer: Self-pay

## 2023-04-09 ENCOUNTER — Other Ambulatory Visit: Payer: Self-pay

## 2023-04-09 DIAGNOSIS — E1165 Type 2 diabetes mellitus with hyperglycemia: Secondary | ICD-10-CM | POA: Diagnosis not present

## 2023-04-15 ENCOUNTER — Other Ambulatory Visit: Payer: Self-pay

## 2023-04-15 ENCOUNTER — Other Ambulatory Visit (HOSPITAL_COMMUNITY): Payer: Self-pay | Admitting: Pharmacy Technician

## 2023-04-15 ENCOUNTER — Other Ambulatory Visit (HOSPITAL_COMMUNITY): Payer: Self-pay

## 2023-04-15 NOTE — Progress Notes (Signed)
 Specialty Pharmacy Refill Coordination Note  Wesley Ho is a 68 y.o. male contacted today regarding refills of specialty medication(s) Emtricitabine -Tenofovir  DF (TRUVADA )   Patient requested Delivery   Delivery date: 04/23/23   Verified address: Patient address [address]  [name]   Medication will be filled on 04/22/23.

## 2023-04-22 ENCOUNTER — Other Ambulatory Visit: Payer: Self-pay

## 2023-04-22 ENCOUNTER — Other Ambulatory Visit (HOSPITAL_COMMUNITY): Payer: Self-pay

## 2023-04-22 DIAGNOSIS — E1165 Type 2 diabetes mellitus with hyperglycemia: Secondary | ICD-10-CM | POA: Diagnosis not present

## 2023-04-29 ENCOUNTER — Other Ambulatory Visit: Payer: Self-pay

## 2023-05-02 ENCOUNTER — Other Ambulatory Visit (HOSPITAL_COMMUNITY): Payer: Self-pay

## 2023-05-02 ENCOUNTER — Other Ambulatory Visit: Payer: Self-pay

## 2023-05-03 ENCOUNTER — Other Ambulatory Visit: Payer: Self-pay

## 2023-05-03 ENCOUNTER — Other Ambulatory Visit (HOSPITAL_COMMUNITY): Payer: Self-pay

## 2023-05-03 DIAGNOSIS — Z794 Long term (current) use of insulin: Secondary | ICD-10-CM | POA: Diagnosis not present

## 2023-05-03 DIAGNOSIS — Z7182 Exercise counseling: Secondary | ICD-10-CM | POA: Diagnosis not present

## 2023-05-03 DIAGNOSIS — E1169 Type 2 diabetes mellitus with other specified complication: Secondary | ICD-10-CM | POA: Diagnosis not present

## 2023-05-03 DIAGNOSIS — Z7984 Long term (current) use of oral hypoglycemic drugs: Secondary | ICD-10-CM | POA: Diagnosis not present

## 2023-05-03 DIAGNOSIS — Z6837 Body mass index (BMI) 37.0-37.9, adult: Secondary | ICD-10-CM | POA: Diagnosis not present

## 2023-05-03 DIAGNOSIS — Z713 Dietary counseling and surveillance: Secondary | ICD-10-CM | POA: Diagnosis not present

## 2023-05-06 ENCOUNTER — Other Ambulatory Visit (HOSPITAL_COMMUNITY): Payer: Self-pay

## 2023-05-06 ENCOUNTER — Other Ambulatory Visit: Payer: Self-pay

## 2023-05-06 MED ORDER — FINASTERIDE 5 MG PO TABS
5.0000 mg | ORAL_TABLET | Freq: Every morning | ORAL | 2 refills | Status: DC
  Filled 2023-05-14: qty 30, 30d supply, fill #0
  Filled 2023-06-10: qty 30, 30d supply, fill #1
  Filled 2023-07-09: qty 30, 30d supply, fill #2

## 2023-05-06 MED ORDER — ALPRAZOLAM 1 MG PO TABS
1.0000 mg | ORAL_TABLET | Freq: Four times a day (QID) | ORAL | 3 refills | Status: DC | PRN
Start: 2023-05-06 — End: 2023-09-16
  Filled 2023-05-06: qty 90, 23d supply, fill #0
  Filled 2023-06-10: qty 90, 23d supply, fill #1
  Filled 2023-07-09: qty 90, 23d supply, fill #2
  Filled 2023-08-15: qty 90, 23d supply, fill #3

## 2023-05-06 MED ORDER — GABAPENTIN 300 MG PO CAPS
ORAL_CAPSULE | ORAL | 11 refills | Status: AC
  Filled 2023-05-06 – 2023-12-03 (×3): qty 90, 30d supply, fill #0
  Filled 2024-02-28: qty 90, 30d supply, fill #1
  Filled 2024-03-23: qty 90, 30d supply, fill #2
  Filled ????-??-??: fill #2

## 2023-05-07 ENCOUNTER — Other Ambulatory Visit: Payer: Self-pay

## 2023-05-07 ENCOUNTER — Other Ambulatory Visit (HOSPITAL_COMMUNITY): Payer: Self-pay

## 2023-05-08 ENCOUNTER — Other Ambulatory Visit (HOSPITAL_COMMUNITY): Payer: Self-pay

## 2023-05-09 ENCOUNTER — Other Ambulatory Visit: Payer: Self-pay

## 2023-05-09 ENCOUNTER — Other Ambulatory Visit (HOSPITAL_COMMUNITY): Payer: Self-pay

## 2023-05-09 MED ORDER — GABAPENTIN 300 MG PO CAPS
300.0000 mg | ORAL_CAPSULE | ORAL | 11 refills | Status: DC | PRN
  Filled 2023-05-09: qty 150, 30d supply, fill #0
  Filled 2023-05-10 – 2023-07-09 (×3): qty 150, 30d supply, fill #1
  Filled ????-??-??: fill #1

## 2023-05-10 ENCOUNTER — Other Ambulatory Visit (HOSPITAL_COMMUNITY): Payer: Self-pay

## 2023-05-10 ENCOUNTER — Other Ambulatory Visit: Payer: Self-pay

## 2023-05-14 ENCOUNTER — Other Ambulatory Visit (HOSPITAL_COMMUNITY): Payer: Self-pay

## 2023-05-14 ENCOUNTER — Other Ambulatory Visit: Payer: Self-pay

## 2023-05-14 ENCOUNTER — Other Ambulatory Visit: Payer: Self-pay | Admitting: Pharmacy Technician

## 2023-05-14 NOTE — Progress Notes (Signed)
 Specialty Pharmacy Refill Coordination Note  Wesley Ho is a 68 y.o. male contacted today regarding refills of specialty medication(s) Emtricitabine-Tenofovir DF (TRUVADA)   Patient requested Delivery   Delivery date: 05/17/23   Verified address: [address]  [name]   Medication will be filled on 05/16/23.

## 2023-05-16 DIAGNOSIS — E1169 Type 2 diabetes mellitus with other specified complication: Secondary | ICD-10-CM | POA: Diagnosis not present

## 2023-05-20 DIAGNOSIS — M79672 Pain in left foot: Secondary | ICD-10-CM | POA: Diagnosis not present

## 2023-05-20 DIAGNOSIS — B351 Tinea unguium: Secondary | ICD-10-CM | POA: Diagnosis not present

## 2023-05-20 DIAGNOSIS — E1142 Type 2 diabetes mellitus with diabetic polyneuropathy: Secondary | ICD-10-CM | POA: Diagnosis not present

## 2023-05-20 DIAGNOSIS — M79671 Pain in right foot: Secondary | ICD-10-CM | POA: Diagnosis not present

## 2023-05-22 ENCOUNTER — Telehealth: Payer: Self-pay | Admitting: Pharmacy Technician

## 2023-05-22 DIAGNOSIS — Z5986 Financial insecurity: Secondary | ICD-10-CM

## 2023-05-22 NOTE — Progress Notes (Signed)
 Pharmacy Medication Assistance Program Note    05/22/2023  Patient ID: Wesley Ho, male   DOB: 1955/04/10, 68 y.o.   MRN: [id number]     04/04/2023 05/22/2023  Outreach Medication One  Initial Outreach Date (Medication One) 12/21/2022   Manufacturer Medication One [employer]   [employer] [facility] Lantus   Dose of Lantus Solostar 100 units/ml   Type of Radiographer, therapeutic Assistance   Date Application Sent to Patient 12/26/2022   Application Items Requested Proof of Income;Application;Other   Date Application Sent to Prescriber 12/26/2022   Name of Prescriber Leone Payor   Date Application Received From Patient  05/22/2023  Application Items Received From Patient  Application;Other  Date Application Received From Provider  04/17/2023  Date Application Submitted to Manufacturer  05/22/2023  Method Application Sent to Manufacturer  Fax       04/04/2023 05/22/2023  Outreach Medication Two  Initial Outreach Date (Medication Two) 12/21/2022   Manufacturer Medication Two Novo Nordisk   [facility] Rybelsus   Dose of Ryblesus 7mg    Type of Radiographer, therapeutic Assistance   Date Application Sent to Patient 12/26/2022   Application Items Requested Application;Proof of Income;Other   Date Application Sent to Prescriber 12/26/2022   Date Application Received From Patient  05/22/2023  Application Items Received From Patient  Application;Other  Date Application Received From Provider  12/28/2022  Method Application Sent to Manufacturer  Fax  Date Application Submitted to Manufacturer  05/22/2023   Submitted applications to [employer] and [employer].  Pattricia Boss, CPhT [place]  Office: [phone] Fax: [phone] Email: [email or web address]

## 2023-05-27 ENCOUNTER — Other Ambulatory Visit: Payer: Self-pay

## 2023-05-27 ENCOUNTER — Telehealth: Payer: Self-pay | Admitting: Pharmacy Technician

## 2023-05-27 DIAGNOSIS — Z5986 Financial insecurity: Secondary | ICD-10-CM

## 2023-05-27 NOTE — Progress Notes (Signed)
 Pharmacy Medication Assistance Program Note    05/27/2023  Patient ID: KEROLOS NEHME, male   DOB: August 22, 1955, 68 y.o.   MRN: [id number]     04/04/2023 05/22/2023 05/27/2023  Outreach Medication One  Initial Outreach Date (Medication One) 12/21/2022    Manufacturer Medication One San[employer]i    [facility] [facility] Lantus    Dose of Lantus Solostar 100 units/ml    Type of Radiographer, therapeutic Assistance    Date Application Sent to Patient 12/26/2022    Application Items Requested Proof of Income;Application;Other    Date Application Sent to Prescriber 12/26/2022    Name of Prescriber Leone Payor    Date Application Received From Patient  05/22/2023   Application Items Received From Patient  Application;Other   Date Application Received From Provider  04/17/2023   Date Application Submitted to Manufacturer  05/22/2023   Method Application Sent to Manufacturer  Fax   Patient Assistance Determination   Approved  Approval Start Date   05/23/2023  Approval End Date   03/04/2024  Patient Notification Method   Telephone Call  Telephone Call Outcome   Successful        04/04/2023 05/22/2023 05/27/2023  Outreach Medication Two  Initial Outreach Date (Medication Two) 12/21/2022    Manufacturer Medication Two Novo Nordisk    [facility] Rybelsus    Dose of Ryblesus 7mg     Type of Radiographer, therapeutic Assistance    Date Application Sent to Patient 12/26/2022    Application Items Requested Application;Proof of Income;Other    Date Application Sent to Prescriber 12/26/2022    Date Application Received From Patient  05/22/2023   Application Items Received From Patient  Application;Other   Date Application Received From Provider  12/28/2022   Method Application Sent to Manufacturer  Fax   Date Application Submitted to Manufacturer  05/22/2023   Patient Assistance Determination   Approved  Approval Start Date   05/23/2023  Patient Notification Method   Telephone Call  Telephone Call Outcome    Successful    2 care coordination calls placed to [employer] in regard to Lantus application and to [employer] in regard to Rybelsus application.  Spoke to [employer] at [employer] who informs patient is APPROVED 05/23/23-03/04/24. She informs initial shipment and subsequent refill shipments will process automatically and be delivered to the prescriber's office. She informs patient can call [employer] to check on his shipments by calling [phone].  Per IVR system at [employer], patient is APPROVED 05/23/23-03/04/24.  Initial shipment and subsequent refill shipments will process automatically and be delivered to the prescriber's office. Patient may call Novo Nordisk to check on shipments by calling [phone].  Successful outreach to patient. HIPAA verified. Informed patient of his approval, refill procedure and expected delivery dates barring any shipping delays or out of stock medications. Patient verbalized understanding.  Will send in basket message to [facility] Pharmacist Casimiro Needle Maize notifying him of case closure as well as the above information.  Pattricia Boss, CPhT [place]  Office: [phone] Fax: [phone] Email: [email or web address]

## 2023-05-28 ENCOUNTER — Other Ambulatory Visit: Payer: Self-pay

## 2023-05-30 DIAGNOSIS — Z7182 Exercise counseling: Secondary | ICD-10-CM | POA: Diagnosis not present

## 2023-05-30 DIAGNOSIS — Z713 Dietary counseling and surveillance: Secondary | ICD-10-CM | POA: Diagnosis not present

## 2023-05-30 DIAGNOSIS — E1169 Type 2 diabetes mellitus with other specified complication: Secondary | ICD-10-CM | POA: Diagnosis not present

## 2023-05-30 DIAGNOSIS — Z7984 Long term (current) use of oral hypoglycemic drugs: Secondary | ICD-10-CM | POA: Diagnosis not present

## 2023-06-05 ENCOUNTER — Other Ambulatory Visit (HOSPITAL_COMMUNITY): Payer: Self-pay

## 2023-06-10 ENCOUNTER — Other Ambulatory Visit: Payer: Self-pay

## 2023-06-10 ENCOUNTER — Other Ambulatory Visit (HOSPITAL_COMMUNITY): Payer: Self-pay

## 2023-06-10 NOTE — Progress Notes (Signed)
 Specialty Pharmacy Refill Coordination Note  Wesley Ho is a 68 y.o. male contacted today regarding refills of specialty medication(s) Emtricitabine-Tenofovir DF (TRUVADA)   Patient requested Delivery   Delivery date: 06/14/23   Verified address: [address]  [name]   Medication will be filled on 06/13/23.

## 2023-06-13 ENCOUNTER — Other Ambulatory Visit: Payer: Self-pay

## 2023-06-13 DIAGNOSIS — E1169 Type 2 diabetes mellitus with other specified complication: Secondary | ICD-10-CM | POA: Diagnosis not present

## 2023-06-19 ENCOUNTER — Other Ambulatory Visit (HOSPITAL_COMMUNITY): Payer: Self-pay

## 2023-06-19 ENCOUNTER — Other Ambulatory Visit: Payer: Self-pay | Admitting: "Endocrinology

## 2023-06-19 ENCOUNTER — Other Ambulatory Visit: Payer: Self-pay

## 2023-06-19 MED ORDER — CHLORTHALIDONE 25 MG PO TABS
25.0000 mg | ORAL_TABLET | Freq: Every morning | ORAL | 2 refills | Status: DC
  Filled 2023-06-20 – 2023-06-25 (×2): qty 30, 30d supply, fill #0
  Filled 2023-07-12 – 2023-07-18 (×2): qty 30, 30d supply, fill #1
  Filled 2023-08-06 – 2023-09-05 (×2): qty 30, 30d supply, fill #2

## 2023-06-19 MED ORDER — DULOXETINE HCL 30 MG PO CPEP
30.0000 mg | ORAL_CAPSULE | Freq: Two times a day (BID) | ORAL | 2 refills | Status: DC
Start: 2023-06-19 — End: 2023-09-23
  Filled 2023-06-20 – 2023-06-25 (×2): qty 60, 30d supply, fill #0
  Filled 2023-07-12 – 2023-07-18 (×2): qty 60, 30d supply, fill #1
  Filled 2023-08-06 – 2023-09-05 (×2): qty 60, 30d supply, fill #2

## 2023-06-19 MED ORDER — ATORVASTATIN CALCIUM 80 MG PO TABS
80.0000 mg | ORAL_TABLET | Freq: Every day | ORAL | 2 refills | Status: DC
  Filled 2023-06-20 – 2023-06-25 (×2): qty 30, 30d supply, fill #0
  Filled 2023-07-12 – 2023-07-18 (×2): qty 30, 30d supply, fill #1
  Filled 2023-08-06 – 2023-09-05 (×2): qty 30, 30d supply, fill #2

## 2023-06-19 MED ORDER — BUSPIRONE HCL 15 MG PO TABS
15.0000 mg | ORAL_TABLET | Freq: Three times a day (TID) | ORAL | 2 refills | Status: DC
  Filled 2023-06-20 – 2023-06-25 (×2): qty 90, 30d supply, fill #0
  Filled 2023-07-12 – 2023-07-18 (×2): qty 90, 30d supply, fill #1
  Filled 2023-08-06 – 2023-09-05 (×2): qty 90, 30d supply, fill #2

## 2023-06-19 MED ORDER — MONTELUKAST SODIUM 10 MG PO TABS
10.0000 mg | ORAL_TABLET | Freq: Every morning | ORAL | 2 refills | Status: DC
  Filled 2023-06-20 – 2023-06-25 (×2): qty 30, 30d supply, fill #0
  Filled 2023-07-12 – 2023-07-18 (×2): qty 30, 30d supply, fill #1
  Filled 2023-08-06 – 2023-09-05 (×2): qty 30, 30d supply, fill #2

## 2023-06-19 MED ORDER — PROPRANOLOL HCL 60 MG PO TABS
60.0000 mg | ORAL_TABLET | Freq: Two times a day (BID) | ORAL | 2 refills | Status: DC
Start: 2023-06-19 — End: 2023-09-23
  Filled 2023-06-20 – 2023-06-25 (×2): qty 60, 30d supply, fill #0
  Filled 2023-07-12 – 2023-07-18 (×2): qty 60, 30d supply, fill #1
  Filled 2023-08-06 – 2023-09-05 (×2): qty 60, 30d supply, fill #2

## 2023-06-19 MED ORDER — OMEGA-3-ACID ETHYL ESTERS 1 G PO CAPS
2.0000 g | ORAL_CAPSULE | Freq: Every morning | ORAL | 2 refills | Status: DC
  Filled 2023-06-20 – 2023-06-25 (×2): qty 60, 30d supply, fill #0
  Filled 2023-07-12 – 2023-07-18 (×2): qty 60, 30d supply, fill #1
  Filled 2023-08-06 – 2023-09-05 (×2): qty 60, 30d supply, fill #2

## 2023-06-19 MED ORDER — METFORMIN HCL 1000 MG PO TABS
1000.0000 mg | ORAL_TABLET | Freq: Two times a day (BID) | ORAL | 1 refills | Status: DC
  Filled 2023-06-19: qty 180, 90d supply, fill #0
  Filled 2023-06-20 – 2023-06-25 (×2): qty 60, 30d supply, fill #0
  Filled 2023-07-12 – 2023-07-18 (×2): qty 60, 30d supply, fill #1
  Filled 2023-08-06 – 2023-09-05 (×2): qty 60, 30d supply, fill #2
  Filled 2023-10-08: qty 60, 30d supply, fill #3
  Filled 2023-11-07 – 2023-11-12 (×2): qty 60, 30d supply, fill #4
  Filled 2023-12-10: qty 60, 30d supply, fill #5

## 2023-06-20 ENCOUNTER — Other Ambulatory Visit: Payer: Self-pay

## 2023-06-20 ENCOUNTER — Other Ambulatory Visit (HOSPITAL_COMMUNITY): Payer: Self-pay

## 2023-06-21 ENCOUNTER — Other Ambulatory Visit: Payer: Self-pay

## 2023-06-24 ENCOUNTER — Other Ambulatory Visit (HOSPITAL_COMMUNITY): Payer: Self-pay

## 2023-06-24 ENCOUNTER — Other Ambulatory Visit: Payer: Self-pay

## 2023-06-25 ENCOUNTER — Other Ambulatory Visit: Payer: Self-pay

## 2023-06-26 ENCOUNTER — Other Ambulatory Visit: Payer: Self-pay

## 2023-06-27 DIAGNOSIS — E119 Type 2 diabetes mellitus without complications: Secondary | ICD-10-CM | POA: Diagnosis not present

## 2023-07-04 LAB — BASIC METABOLIC PANEL WITH GFR
BUN: 12 (ref 4–21)
Creatinine: 0.9 (ref 0.6–1.3)

## 2023-07-04 LAB — LIPID PANEL
Cholesterol: 178 (ref 0–200)
HDL: 35 (ref 35–70)
LDL Cholesterol: 118
Triglycerides: 139 (ref 40–160)

## 2023-07-04 LAB — HEMOGLOBIN A1C: Hemoglobin A1C: 8.2

## 2023-07-08 LAB — LAB REPORT - SCANNED
A1c: 8.2
Albumin, Urine POC: 19.7
Chlamydia, Swab/Urine, PCR: NEGATIVE
Creatinine, POC: 288.7 mg/dL
EGFR (Non-African Amer.): 94
HM HIV Screening: NEGATIVE
Microalb Creat Ratio: 7

## 2023-07-09 ENCOUNTER — Other Ambulatory Visit (HOSPITAL_COMMUNITY): Payer: Self-pay

## 2023-07-10 ENCOUNTER — Other Ambulatory Visit: Payer: Self-pay

## 2023-07-11 DIAGNOSIS — E669 Obesity, unspecified: Secondary | ICD-10-CM | POA: Diagnosis not present

## 2023-07-11 DIAGNOSIS — E782 Mixed hyperlipidemia: Secondary | ICD-10-CM | POA: Diagnosis not present

## 2023-07-11 DIAGNOSIS — Z2981 Encounter for HIV pre-exposure prophylaxis: Secondary | ICD-10-CM | POA: Diagnosis not present

## 2023-07-11 DIAGNOSIS — E875 Hyperkalemia: Secondary | ICD-10-CM | POA: Diagnosis not present

## 2023-07-11 DIAGNOSIS — R600 Localized edema: Secondary | ICD-10-CM | POA: Diagnosis not present

## 2023-07-11 DIAGNOSIS — K7030 Alcoholic cirrhosis of liver without ascites: Secondary | ICD-10-CM | POA: Diagnosis not present

## 2023-07-11 DIAGNOSIS — E1110 Type 2 diabetes mellitus with ketoacidosis without coma: Secondary | ICD-10-CM | POA: Diagnosis not present

## 2023-07-11 DIAGNOSIS — E1140 Type 2 diabetes mellitus with diabetic neuropathy, unspecified: Secondary | ICD-10-CM | POA: Diagnosis not present

## 2023-07-11 DIAGNOSIS — I700 Atherosclerosis of aorta: Secondary | ICD-10-CM | POA: Diagnosis not present

## 2023-07-11 DIAGNOSIS — J439 Emphysema, unspecified: Secondary | ICD-10-CM | POA: Diagnosis not present

## 2023-07-11 DIAGNOSIS — I10 Essential (primary) hypertension: Secondary | ICD-10-CM | POA: Diagnosis not present

## 2023-07-11 DIAGNOSIS — K222 Esophageal obstruction: Secondary | ICD-10-CM | POA: Diagnosis not present

## 2023-07-12 ENCOUNTER — Encounter: Payer: Self-pay | Admitting: Family Medicine

## 2023-07-12 ENCOUNTER — Other Ambulatory Visit: Payer: Self-pay

## 2023-07-12 NOTE — Progress Notes (Signed)
 Specialty Pharmacy Refill Coordination Note  Wesley Ho is a 68 y.o. male contacted today regarding refills of specialty medication(s) Emtricitabine -Tenofovir  DF (TRUVADA )   Patient requested Delivery   Delivery date: 07/16/23   Verified address: [address]  [place] [address]   Medication will be filled on 07/15/23.

## 2023-07-12 NOTE — Progress Notes (Signed)
 Clinical Intervention Note  Clinical Intervention Notes: Patient reported starting tadalafil and Rybelsus . No DDIs identified with Truvada    Clinical Intervention Outcomes: Prevention of an adverse drug event   Advertising account planner

## 2023-07-15 ENCOUNTER — Other Ambulatory Visit (HOSPITAL_COMMUNITY): Payer: Self-pay | Admitting: Neurosurgery

## 2023-07-15 ENCOUNTER — Other Ambulatory Visit: Payer: Self-pay

## 2023-07-15 ENCOUNTER — Other Ambulatory Visit (HOSPITAL_COMMUNITY): Payer: Self-pay

## 2023-07-15 DIAGNOSIS — E1140 Type 2 diabetes mellitus with diabetic neuropathy, unspecified: Secondary | ICD-10-CM | POA: Diagnosis not present

## 2023-07-15 DIAGNOSIS — G894 Chronic pain syndrome: Secondary | ICD-10-CM

## 2023-07-15 MED ORDER — GABAPENTIN 300 MG PO CAPS
ORAL_CAPSULE | ORAL | 0 refills | Status: DC
  Filled 2023-09-23: qty 150, 30d supply, fill #0

## 2023-07-18 ENCOUNTER — Other Ambulatory Visit: Payer: Self-pay

## 2023-07-26 ENCOUNTER — Ambulatory Visit (HOSPITAL_COMMUNITY)
Admission: RE | Admit: 2023-07-26 | Discharge: 2023-07-26 | Disposition: A | Discharge status: Home / Self Care | Source: Ambulatory Visit | Attending: Neurosurgery | Admitting: Neurosurgery

## 2023-07-26 ENCOUNTER — Other Ambulatory Visit (HOSPITAL_COMMUNITY): Payer: Self-pay

## 2023-07-26 DIAGNOSIS — M47814 Spondylosis without myelopathy or radiculopathy, thoracic region: Secondary | ICD-10-CM | POA: Diagnosis not present

## 2023-07-26 DIAGNOSIS — G894 Chronic pain syndrome: Secondary | ICD-10-CM

## 2023-07-26 DIAGNOSIS — M40204 Unspecified kyphosis, thoracic region: Secondary | ICD-10-CM | POA: Diagnosis not present

## 2023-07-26 DIAGNOSIS — Z794 Long term (current) use of insulin: Secondary | ICD-10-CM | POA: Diagnosis not present

## 2023-07-26 DIAGNOSIS — E1165 Type 2 diabetes mellitus with hyperglycemia: Secondary | ICD-10-CM | POA: Diagnosis not present

## 2023-07-30 ENCOUNTER — Other Ambulatory Visit: Payer: Self-pay

## 2023-08-01 ENCOUNTER — Encounter: Payer: PPO | Attending: Internal Medicine | Admitting: Nutrition

## 2023-08-01 ENCOUNTER — Encounter: Payer: Self-pay | Admitting: "Endocrinology

## 2023-08-01 ENCOUNTER — Ambulatory Visit: Payer: Medicare HMO | Admitting: "Endocrinology

## 2023-08-01 VITALS — Ht 68.0 in | Wt 233.0 lb

## 2023-08-01 VITALS — BP 116/64 | HR 76 | Ht 68.0 in | Wt 233.6 lb

## 2023-08-01 DIAGNOSIS — E1165 Type 2 diabetes mellitus with hyperglycemia: Secondary | ICD-10-CM | POA: Diagnosis not present

## 2023-08-01 DIAGNOSIS — Z6834 Body mass index (BMI) 34.0-34.9, adult: Secondary | ICD-10-CM | POA: Diagnosis not present

## 2023-08-01 DIAGNOSIS — E782 Mixed hyperlipidemia: Secondary | ICD-10-CM | POA: Diagnosis not present

## 2023-08-01 DIAGNOSIS — I10 Essential (primary) hypertension: Secondary | ICD-10-CM | POA: Insufficient documentation

## 2023-08-01 DIAGNOSIS — E66811 Obesity, class 1: Secondary | ICD-10-CM

## 2023-08-01 DIAGNOSIS — Z794 Long term (current) use of insulin: Secondary | ICD-10-CM | POA: Diagnosis not present

## 2023-08-01 DIAGNOSIS — E66812 Obesity, class 2: Secondary | ICD-10-CM | POA: Diagnosis not present

## 2023-08-01 DIAGNOSIS — E6609 Other obesity due to excess calories: Secondary | ICD-10-CM | POA: Diagnosis not present

## 2023-08-01 MED ORDER — LANTUS SOLOSTAR 100 UNIT/ML ~~LOC~~ SOPN: 50.0000 [IU] | PEN_INJECTOR | Freq: Every day | SUBCUTANEOUS | 1 refills | Status: DC

## 2023-08-01 MED ORDER — RYBELSUS 14 MG PO TABS: 14.0000 mg | ORAL_TABLET | Freq: Every day | ORAL | 1 refills | Status: AC

## 2023-08-01 NOTE — Patient Instructions (Signed)

## 2023-08-01 NOTE — Patient Instructions (Addendum)
 Goals  Eat breakfast of yogurt and fruit for breakfast Work on downloading [employer] app and order groceries on line CHeck out [employer] for food delivery. Cut out chocolate milk and replace milk Eat breakfast at 8 am, lunch 12-2 and D) 5-7 pm Don't skip meals Need to eat 3 meals per day to prevent hypoglycemia and cravings for sweets.

## 2023-08-01 NOTE — Progress Notes (Signed)
 08/01/2023, 10:29 AM  Endocrinology follow-up note   Subjective:    Patient ID: Wesley Ho, male    DOB: 28-Jul-1955.  Wesley Ho is being seen in follow-up after he was seen in consultation for management of currently uncontrolled symptomatic diabetes requested by  Omie Bickers, MD.   Past Medical History:  Diagnosis Date   Anxiety    Arthritis    Bronchitis    Chronic diarrhea    Chronic edema of lower extremity 09/28/2010   COPD (chronic obstructive pulmonary disease) (HCC)    Diabetes mellitus without complication (HCC)    Diabetes mellitus, type II (HCC)    DVT (deep venous thrombosis) (HCC) 09/28/2010   Emphysema of lung (HCC)    Fatigue    H/O Thrombocytopenia 09/28/2010   Hypertension    Left radial fracture    Multiple lung nodules 09/28/2010   Neuropathy     Past Surgical History:  Procedure Laterality Date   BIOPSY  05/29/2022   Procedure: BIOPSY;  Surgeon: Urban Garden, MD;  Location: [facility];  Service: Gastroenterology;;   CATARACT EXTRACTION W/PHACO Right 01/17/2016   Procedure: CATARACT EXTRACTION PHACO AND INTRAOCULAR LENS PLACEMENT (IOC);  Surgeon: Albert Huff, MD;  Location: [facility];  Service: Ophthalmology;  Laterality: Right;  CDE: 4.34   CATARACT EXTRACTION W/PHACO Left 01/31/2016   Procedure: CATARACT EXTRACTION PHACO AND INTRAOCULAR LENS PLACEMENT (IOC);  Surgeon: Albert Huff, MD;  Location: [facility];  Service: Ophthalmology;  Laterality: Left;  CDE: 5.69   CHOLECYSTECTOMY     ESOPHAGEAL DILATION N/A 05/29/2022   Procedure: ESOPHAGEAL DILATION;  Surgeon: Urban Garden, MD;  Location: [facility];  Service: Gastroenterology;  Laterality: N/A;   ESOPHAGOGASTRODUODENOSCOPY (EGD) WITH PROPOFOL  N/A 05/29/2022   Procedure: ESOPHAGOGASTRODUODENOSCOPY (EGD) WITH PROPOFOL ;  Surgeon: Urban Garden, MD;  Location: [facility];  Service: Gastroenterology;  Laterality: N/A;  10:30 am, asa 3   MOUTH SURGERY      Social History   Socioeconomic History   Marital status: Divorced    Spouse name: Not on file   Number of children: 2   Years of education: Not on file   Highest education level: Not on file  Occupational History   Occupation: Retired  Tobacco Use   Smoking status: Former    Current packs/day: 0.00    Average packs/day: 1 pack/day for 40.0 years (40.0 ttl pk-yrs)    Types: Cigarettes    Start date: 01/11/1973    Quit date: 01/11/2013    Years since quitting: 10.5   Smokeless tobacco: Never  Vaping Use   Vaping status: Never Used  Substance and Sexual Activity   Alcohol use: No    Comment: stopped drinking 2021   Drug use: No   Sexual activity: Not Currently    Birth control/protection: None  Other Topics Concern   Not on file  Social History Narrative   Not on file   Social Drivers of Health   Financial Resource Strain: Not on file  Food Insecurity: No Food Insecurity (04/03/2022)   Hunger Vital Sign  Worried About Programme researcher, broadcasting/film/video in the Last Year: Never true    Ran Out of Food in the Last Year: Never true  Transportation Needs: No Transportation Needs (04/03/2022)   PRAPARE - Administrator, Civil Service (Medical): No    Lack of Transportation (Non-Medical): No  Physical Activity: Not on file  Stress: Not on file  Social Connections: Not on file    Family History  Problem Relation Age of Onset   Diabetes Mother    Clotting disorder Father    Alcohol abuse Maternal Uncle    Lung cancer Maternal Grandfather    Emphysema Paternal Grandfather     Outpatient Encounter Medications as of 08/01/2023  Medication Sig   Semaglutide  (RYBELSUS ) 14 MG TABS Take 1 tablet (14 mg total) by mouth daily.   acetaminophen  (TYLENOL ) 500 MG tablet Take 500-1,000 mg by mouth every 6 (six) hours as needed for mild pain.   albuterol  (VENTOLIN  HFA) 108 (90 Base) MCG/ACT  inhaler Inhale 1 puff into the lungs by mouth every 4 - 6 hours as needed   ALPRAZolam  (XANAX ) 1 MG tablet Take 1 mg by mouth every 6 (six) hours as needed for anxiety.   ALPRAZolam  (XANAX ) 1 MG tablet Take 1 tablet (1 mg total) by mouth every 6 (six) hours as needed.   ALPRAZolam  (XANAX ) 1 MG tablet Take 1 tablet (1 mg total) by mouth every 6 (six) hours as needed for anxiety   ALPRAZolam  (XANAX ) 1 MG tablet Take 1 tablet (1 mg total) by mouth every 6 (six) hours as needed for anxiety.   atorvastatin  (LIPITOR) 80 MG tablet Take 1 tablet (80 mg total) by mouth daily.   Blood Glucose Monitoring Suppl DEVI 1 each by Does not apply route in the morning, at noon, and at bedtime. May substitute to any manufacturer covered by patient's insurance.   Budeson-Glycopyrrol-Formoterol  (BREZTRI  AEROSPHERE) 160-9-4.8 MCG/ACT AERO Inhale 2 puffs into the lungs 2 (two) times daily.   busPIRone  (BUSPAR ) 15 MG tablet Take 1 tablet (15 mg total) by mouth 3 (three) times daily. (Patient taking differently: Take 15 mg by mouth 2 (two) times daily.)   busPIRone  (BUSPAR ) 15 MG tablet Take 1 tablet (15 mg total) by mouth 3 (three) times daily.   carboxymethylcellulose (REFRESH TEARS) 0.5 % SOLN Place 1 drop into both eyes 2 (two) times daily as needed (dry eyes).   chlorthalidone  (HYGROTON ) 25 MG tablet Take 25 mg by mouth every morning.   chlorthalidone  (HYGROTON ) 25 MG tablet Take 1 tablet (25 mg total) by mouth every morning.   Continuous Glucose Sensor (FREESTYLE LIBRE 3 SENSOR) MISC Place 1 sensor on the skin every 14 days. Use to check glucose continuously   Continuous Glucose Sensor (FREESTYLE LIBRE 3 SENSOR) MISC Use as directed every 14 (fourteen) days.   Cyanocobalamin  2500 MCG TABS Take 2,500 mcg by mouth daily.   doxycycline  (VIBRA -TABS) 100 MG tablet Take 2 tablets (200 mg total) by mouth daily as needed.   doxycycline  (VIBRA -TABS) 100 MG tablet Take 2 tablets (200 mg total) by mouth as needed.   DULoxetine   (CYMBALTA ) 30 MG capsule Take 30 mg by mouth 2 (two) times daily.   DULoxetine  (CYMBALTA ) 30 MG capsule Take 1 capsule (30 mg total) by mouth 2 (two) times daily.   emtricitabine -tenofovir  (TRUVADA ) 200-300 MG tablet Take 1 tablet every day by oral route for 90 days.   finasteride  (PROSCAR ) 5 MG tablet Take 5 mg by mouth daily.   finasteride  (  PROSCAR ) 5 MG tablet Take 1 tablet (5 mg total) by mouth every morning.   furosemide  (LASIX ) 20 MG tablet Take 20 mg by mouth daily.   furosemide  (LASIX ) 20 MG tablet Take 1 tablet (20 mg total) by mouth daily as needed for swelling.   gabapentin  (NEURONTIN ) 300 MG capsule Take 2 capsules (600 mg total) in the morning, 1 capsule (300 mg total) daily at noon, 2 capsules (600 mg total) at bedtime.   insulin  glargine (LANTUS  SOLOSTAR) 100 UNIT/ML Solostar Pen Inject 50 Units into the skin at bedtime.   Insulin  Pen Needle (INSUPEN PEN NEEDLES) 32G X 4 MM MISC Use to inject insulin  daily   lactulose  (CHRONULAC ) 10 GM/15ML solution Take 30 mLs (20 g total) by mouth 2 (two) times daily.   levocetirizine (XYZAL ) 5 MG tablet Take 5 mg by mouth every morning.   levocetirizine (XYZAL ) 5 MG tablet Take 1 tablet (5 mg total) by mouth every morning.   lidocaine -prilocaine (EMLA) cream Apply 1 Application topically daily as needed (pain).   metFORMIN  (GLUCOPHAGE ) 1000 MG tablet Take 1 tablet (1,000 mg total) by mouth 2 (two) times daily with a meal.   montelukast  (SINGULAIR ) 10 MG tablet Take 10 mg by mouth daily.   montelukast  (SINGULAIR ) 10 MG tablet Take 1 tablet (10 mg total) by mouth every morning.   omega-3 acid ethyl esters (LOVAZA ) 1 g capsule Take 2 g by mouth daily.   omega-3 acid ethyl esters (LOVAZA ) 1 g capsule Take 2 capsules (2 g total) by mouth every morning.   omeprazole  (PRILOSEC) 40 MG capsule Take 1 capsule (40 mg total) by mouth 2 (two) times daily.   potassium chloride  SA (KLOR-CON  M) 20 MEQ tablet Take 1 tablet (20 mEq total) by mouth daily for 5  days. (Patient not taking: Reported on 05/23/2022)   pregabalin  (LYRICA ) 200 MG capsule Take 200 mg by mouth 2 (two) times daily.   propranolol  (INDERAL ) 60 MG tablet Take 60 mg by mouth 2 (two) times daily.   propranolol  (INDERAL ) 60 MG tablet Take 1 tablet (60 mg total) by mouth 2 (two) times daily.   sucralfate  (CARAFATE ) 1 GM/10ML suspension Take 10 mLs (1 g total) by mouth 4 (four) times daily -  with meals and at bedtime. (Patient not taking: Reported on 08/13/2022)   [DISCONTINUED] insulin  glargine (LANTUS  SOLOSTAR) 100 UNIT/ML Solostar Pen Inject 60 Units into the skin at bedtime.   [DISCONTINUED] Semaglutide  (RYBELSUS ) 7 MG TABS Take 1 tablet (7 mg total) by mouth daily.   No facility-administered encounter medications on file as of 08/01/2023.    ALLERGIES: No Known Allergies  VACCINATION STATUS: Immunization History  Administered Date(s) Administered   Fluad Quad(high Dose 65+) 12/21/2019   Influenza-Unspecified 11/03/2017   Moderna Covid-19 Fall Seasonal Vaccine 49yrs & older 04/01/2023   Tdap 09/02/2019    Diabetes He presents for his follow-up diabetic visit. He has type 2 diabetes mellitus. Onset time: Patient reports that he was not aware of having diabetes and did he went to the hospital with severe hyperglycemia leading to diabetes ketoacidosis recently.  He did have A1c of 6.1% consistent with prediabetes in 2016. His disease course has been improving. There are no hypoglycemic associated symptoms. Pertinent negatives for hypoglycemia include no confusion, headaches, pallor or seizures. Pertinent negatives for diabetes include no chest pain, no fatigue, no polydipsia, no polyphagia, no polyuria and no weakness. There are no hypoglycemic complications. Symptoms are improving. Risk factors for coronary artery disease include diabetes mellitus,  dyslipidemia, hypertension, male sex, tobacco exposure, sedentary lifestyle, family history and obesity. Current diabetic treatments: He  is on Lantus , metformin  and Rybelsus . His weight is fluctuating minimally. He is following a generally unhealthy diet. When asked about meal planning, he reported none. He has not had a previous visit with a dietitian. He never participates in exercise. His home blood glucose trend is decreasing steadily. His breakfast blood glucose range is generally 110-130 mg/dl. His lunch blood glucose range is generally 110-130 mg/dl. His dinner blood glucose range is generally 110-130 mg/dl. His bedtime blood glucose range is generally 110-130 mg/dl. His overall blood glucose range is 110-130 mg/dl. (He uses his CGM on his phone.  His most recent 14 days average blood glucose is 124 mg/dl, showing 64% time range, 7% level 1 hyperglycemia, 1% level 1 hypoglycemia.  His previsit A1c is 8.2%, however his recent weeks are tighter.  Admittedly, he is late for breakfast and drops blood glucose more mornings than not.  ) An ACE inhibitor/angiotensin II receptor blocker is being taken.  Hypertension This is a chronic problem. The current episode started more than 1 year ago. The problem is controlled. Pertinent negatives include no chest pain, headaches, neck pain, palpitations or shortness of breath. Risk factors for coronary artery disease include obesity, male gender, dyslipidemia, diabetes mellitus, smoking/tobacco exposure and sedentary lifestyle. Past treatments include beta blockers and diuretics. Compliance problems: He has a documented history of medical noncompliance.   Hyperlipidemia This is a chronic problem. Exacerbating diseases include diabetes and obesity. Pertinent negatives include no chest pain, myalgias or shortness of breath. Current antihyperlipidemic treatment includes statins. Risk factors for coronary artery disease include dyslipidemia, diabetes mellitus, hypertension, male sex, a sedentary lifestyle, family history and obesity.     Objective:       08/01/2023   10:17 AM 08/01/2023    9:53 AM  04/02/2023   10:33 AM  Vitals with BMI  Height 5\' 8"  5\' 8"  5\' 8"   Weight 233 lbs 233 lbs 10 oz 234 lbs  BMI 35.44 35.53 35.59  Systolic  116   Diastolic  64   Pulse  76     BP 116/64   Pulse 76   Ht 5\' 8"  (1.727 m)   Wt 233 lb 9.6 oz (106 kg)   BMI 35.52 kg/m   Wt Readings from Last 3 Encounters:  08/01/23 233 lb 9.6 oz (106 kg)  04/02/23 234 lb (106.1 kg)  04/02/23 234 lb (106.1 kg)      CMP ( most recent) CMP     Component Value Date/Time   NA 138 11/09/2022 0000   K 5.1 11/09/2022 0000   CL 95 (A) 11/09/2022 0000   CO2 29 (A) 11/09/2022 0000   GLUCOSE 226 (H) 05/07/2022 1339   BUN 12 07/04/2023 0000   CREATININE 0.9 07/04/2023 0000   CREATININE 0.84 05/07/2022 1339   CALCIUM  9.6 03/20/2023 1528   PROT 8.0 05/07/2022 1339   ALBUMIN 4.2 11/09/2022 0000   AST 20 11/09/2022 0000   ALT 16 11/09/2022 0000   ALKPHOS 85 11/09/2022 0000   BILITOT 0.5 05/07/2022 1339   GFRNONAA 94 07/08/2023 0831   GFRAA >60 09/02/2019 1656     Diabetic Labs (most recent): Lab Results  Component Value Date   HGBA1C 8.2 07/04/2023   HGBA1C 7.4 11/09/2022   HGBA1C 8.4 (A) 08/20/2022     Lab Results  Component Value Date   TSH 0.904 10/30/2016   TSH 2.280 07/07/2014  Lipid Panel     Component Value Date/Time   CHOL 178 07/04/2023 0000   TRIG 139 07/04/2023 0000   HDL 35 07/04/2023 0000   LDLCALC 118 07/04/2023 0000    Assessment & Plan:   1. Type 2 diabetes mellitus with hyperglycemia, with long-term current use of insulin  (HCC)  - URIYAH RASKA has currently uncontrolled symptomatic type 2 DM since  68 years of age.  He uses his CGM on his phone.  His most recent 14 days average blood glucose is 124 mg/dl, showing 91% time range, 7% level 1 hyperglycemia, 1% level 1 hypoglycemia.  His previsit A1c is 8.2%, however his recent weeks are tighter.  Admittedly, he is late for breakfast and drops blood glucose more mornings than not.  Recent labs reviewed. - I had a  long discussion with him about the possible risk factors and  the pathology behind its diabetes and its complications. -his diabetes is complicated by obesity/sedentary life and he remains at a high risk for more acute and chronic complications which include CAD, CVA, CKD, retinopathy, and neuropathy. These are all discussed in detail with him.  - I discussed all available options of managing his diabetes including de-escalation of medications. I have counseled him on Food as Medicine by adopting a Whole Food , Plant Predominant  ( WFPP) nutrition as recommended by [employer] of Lifestyle Medicine. Patient is encouraged to switch to  unprocessed or minimally processed  complex starch, adequate protein intake (mainly plant source), minimal liquid fat, plenty of fruits, and vegetables. -  he is advised to stick to a routine mealtimes to eat 3 complete meals a day and snack only when necessary ( to snack only to correct hypoglycemia BG <70 day time or <100 at night).    - he acknowledges that there is a room for improvement in his food and drink choices. - Further Specific Suggestion is made for him to avoid simple carbohydrates  from his diet including Cakes, Sweet Desserts, Ice Cream, Soda (diet and regular), Sweet Tea, Candies, Chips, Cookies, Store Bought Juices, Alcohol ,  Artificial Sweeteners,  Coffee Creamer, and "Sugar-free" Products. This will help patient to have more stable blood glucose profile and potentially avoid unintended weight gain.   - he will be scheduled with Penny Crumpton, RDN, CDE for individualized diabetes education.  - I have approached him with the following individualized plan to manage  his diabetes and patient agrees:   -He is responding to his current regimen of multiple medications.  In the interest of avoiding hypoglycemia, he is advised to lower Lantus  to 50 units nightly.  He may benefit from a higher dose of Rybelsus , after finishing his Current supplies of  Rybelsus  7 mg, this medication will be increased to 40 mg p.o. daily at breakfast.   -He will not be considered for prandial insulin  for now.  He is encouraged to eat breakfast on time. - he  advised to continue metformin  1000 mg p.o. twice daily.  He is advised to continue to use his CGM continuously.  And he is encouraged to call clinic for hypoglycemia under 70 or hyperglycemia above 200 mg/day.  - Specific targets for  A1c;  LDL, HDL,  and Triglycerides were discussed with the patient.  2) Blood Pressure /Hypertension:  His blood pressure is controlled to target.  he is advised to continue his current medications including chlorthalidone  25 mg p.o. daily, Lasix  as needed.   3) Lipids/Hyperlipidemia: His previsit labs show  lipid panel still not controlled with LDL at 118.    He reports he is more consistent taking his statin.  He is advised to continue atorvastatin  80 mg p.o. nightly.    Side effects and precautions discussed with him.  4)  Weight/Diet:  Body mass index is 35.52 kg/m.  -   clearly complicating his diabetes care.   he is  a candidate for weight loss. I discussed with him the fact that loss of 5 - 10% of his  current body weight will have the most impact on his diabetes management.  The above detailed  [facility] recommendations for nutrition, exercise, sleep, social life, avoidance of risky substances, the need for restorative sleep   information will also detailed on discharge instructions.  5) Chronic Care/Health Maintenance:  -he  is on Statin medications and  is encouraged to initiate and continue to follow up with Ophthalmology, Dentist,  Podiatrist at least yearly or according to recommendations, and advised to   stay away from smoking. I have recommended yearly flu vaccine and pneumonia vaccine at least every 5 years; moderate intensity exercise for up to 150 minutes weekly; and  sleep for 7- 9 hours a day.  - he is  advised to maintain close follow up with Omie Bickers,  MD for primary care needs, as well as his other providers for optimal and coordinated care.   I spent  40  minutes in the care of the patient today including review of labs from CMP, Lipids, Thyroid  Function, Hematology (current and previous including abstractions from other facilities); face-to-face time discussing  his blood glucose readings/logs, discussing hypoglycemia and hyperglycemia episodes and symptoms, medications doses, his options of short and long term treatment based on the latest standards of care / guidelines;  discussion about incorporating lifestyle medicine;  and documenting the encounter. Risk reduction counseling performed per USPSTF guidelines to reduce  obesity and cardiovascular risk factors.     Please refer to Patient Instructions for Blood Glucose Monitoring and Insulin /Medications Dosing Guide"  in media tab for additional information. Please  also refer to " Patient Self Inventory" in the Media  tab for reviewed elements of pertinent patient history.  Wesley Ho participated in the discussions, expressed understanding, and voiced agreement with the above plans.  All questions were answered to his satisfaction. he is encouraged to contact clinic should he have any questions or concerns prior to his return visit.     Follow up plan: - Return in about 4 months (around 12/02/2023) for Bring Meter/CGM Device/Logs- A1c in Office.  Wesley Orf, MD [facility] [address] [place], [place] [address] Phone: [phone]  Fax: [phone]    08/01/2023, 10:29 AM  This note was partially dictated with voice recognition software. Similar sounding words can be transcribed inadequately or may not  be corrected upon review.

## 2023-08-01 NOTE — Progress Notes (Signed)
 Medical Nutrition Therapy  Appointment Start time:  1017 Appointment End time:  1049 Primary concerns today: DM Type 2  Referral diagnosis: E11.8 Preferred learning style: Visual  Learning readiness: Ready   NUTRITION ASSESSMENT Follow up DM  Saw Dr. Monte Antonio. A1C 6.8%, down from 8.2%. Has had some low blood sugars based on symptoms. Dr. Monte Antonio to reduce Lantus  today to 50 units per day. Tends to skip breakfast or eat later-- closer to 10-12 noon and glucose drops.Doesn't get hungry at times and doesn't eat but then eats processed or junk food when he does eat at times. Cut out [employer] and now drinking 2% chocolate milk. Willing to try to change to just milk for less sugar. Still struggles with craving and eating sweets. Trying to make better choices when eating out. Has cut back on PG's. Lost 1 lb. He is now taking his Rybelsus  now at 7 mg. Metformin  1000 mg BID. CMG 92% TIR, 7% hypoglycemia. Not exercising due to deconditioning and feet issues.  Wt Readings from Last 3 Encounters:  08/01/23 233 lb (105.7 kg)  08/01/23 233 lb 9.6 oz (106 kg)  04/02/23 234 lb (106.1 kg)   Ht Readings from Last 3 Encounters:  08/01/23 5\' 8"  (1.727 m)  08/01/23 5\' 8"  (1.727 m)  04/02/23 5\' 8"  (1.727 m)   Body mass index is 35.43 kg/m. @BMIFA @ Facility age limit for growth %iles is 20 years. Facility age limit for growth %iles is 20 years.   Clinical Medical Hx: DM Type 2, Obesity, Cirrhosis of liver, GERD, HTN, Hyperlipidemia, depression and anxiety Medications: Rybelsus ,  60 Lantus  down from 70,  Labs:        Latest Ref Rng & Units 03/20/2023    3:28 PM 11/09/2022   12:00 AM 05/07/2022    1:39 PM  CMP  Glucose 65 - 99 mg/dL   102   BUN 4 - 21  11     20    Creatinine 0.6 - 1.3  0.9     0.84   Sodium 137 - 147  138     137   Potassium 3.5 - 5.1 mEq/L  5.1     4.5   Chloride 99 - 108  95     94   CO2 13 - 22  29     32   Calcium   9.6     9.9     9.8   Total Protein 6.1 - 8.1 g/dL   8.0    Total Bilirubin 0.2 - 1.2 mg/dL   0.5   Alkaline Phos 25 - 125  85       AST 14 - 40  20     22   ALT 10 - 40 U/L  16     25      This result is from an external source.    Notable Signs/Symptoms: Chronic fatigue, neuropathy in feet, increased thirst, blurry vision  Lifestyle & Dietary Hx Lives by himself  Estimated daily fluid intake: 60 oz Supplements: See chart Sleep: a lot Stress / self-care: his health Current average weekly physical activity: ADL due to neuropathy  24-Hr Dietary Recall Eats 1 meal per day and snacks on candy or chocolate milk Drinks propels. Eats at [facility] for brunch most days. Doesn't cook.   Estimated Energy Needs Calories: 1600 Carbohydrate: 180g Protein: 120g Fat: 44g   NUTRITION DIAGNOSIS  NB-1.1 Food and nutrition-related knowledge deficit As related to Excessive carb, fat and salt intake.  As  evidenced by DM Type 2, A1C 13.2%, Obesity.   NUTRITION INTERVENTION  Nutrition education (E-1) on the following topics:  Nutrition and Diabetes education provided on My Plate, CHO counting, meal planning, portion sizes, timing of meals, avoiding snacks between meals unless having a low blood sugar, target ranges for A1C and blood sugars, signs/symptoms and treatment of hyper/hypoglycemia, monitoring blood sugars, taking medications as prescribed, benefits of exercising 30 minutes per day and prevention of complications of DM.  Lifestyle Medicine  - Whole Food, Plant Predominant Nutrition is highly recommended: Eat Plenty of vegetables, Mushrooms, fruits, Legumes, Whole Grains, Nuts, seeds in lieu of processed meats, processed snacks/pastries red meat, poultry, eggs.    -It is better to avoid simple carbohydrates including: Cakes, Sweet Desserts, Ice Cream, Soda (diet and regular), Sweet Tea, Candies, Chips, Cookies, Store Bought Juices, Alcohol in Excess of  1-2 drinks a day, Lemonade,  Artificial Sweeteners, Doughnuts, Coffee Creamers, "Sugar-free"  Products, etc, etc.  This is not a complete list.....  Exercise: If you are able: 30 -60 minutes a day ,4 days a week, or 150 minutes a week.  The longer the better.  Combine stretch, strength, and aerobic activities.  If you were told in the past that you have high risk for cardiovascular diseases, you may seek evaluation by your heart doctor prior to initiating moderate to intense exercise programs.   Handouts Provided Include     Learning Style & Readiness for Change Teaching method utilized: Visual & Auditory  Demonstrated degree of understanding via: Teach Back  Barriers to learning/adherence to lifestyle change: None  Goals Established by Pt Goals  Eat breakfast of yogurt and fruit for breakfast Work on downloading [employer] app and order groceries on line CHeck out [employer] for food delivery. Cut out chocolate milk and replace milk Eat breakfast at 8 am, lunch 12-2 and D) 5-7 pm Don't skip meals Need to eat 3 meals per day to prevent hypoglycemia and cravings for sweets.   MONITORING & EVALUATION Dietary intake, weekly physical activity, and blood sugars in 3 month.  Next Steps  Patient is to work on consistent meal planning and whole plant based foods.Wesley Ho

## 2023-08-02 ENCOUNTER — Other Ambulatory Visit (HOSPITAL_COMMUNITY): Payer: Self-pay

## 2023-08-05 DIAGNOSIS — B351 Tinea unguium: Secondary | ICD-10-CM | POA: Diagnosis not present

## 2023-08-05 DIAGNOSIS — M79672 Pain in left foot: Secondary | ICD-10-CM | POA: Diagnosis not present

## 2023-08-05 DIAGNOSIS — E1142 Type 2 diabetes mellitus with diabetic polyneuropathy: Secondary | ICD-10-CM | POA: Diagnosis not present

## 2023-08-05 DIAGNOSIS — M79671 Pain in right foot: Secondary | ICD-10-CM | POA: Diagnosis not present

## 2023-08-06 ENCOUNTER — Other Ambulatory Visit: Payer: Self-pay

## 2023-08-06 DIAGNOSIS — E119 Type 2 diabetes mellitus without complications: Secondary | ICD-10-CM | POA: Diagnosis not present

## 2023-08-06 DIAGNOSIS — H52223 Regular astigmatism, bilateral: Secondary | ICD-10-CM | POA: Diagnosis not present

## 2023-08-06 DIAGNOSIS — H5203 Hypermetropia, bilateral: Secondary | ICD-10-CM | POA: Diagnosis not present

## 2023-08-06 DIAGNOSIS — Z961 Presence of intraocular lens: Secondary | ICD-10-CM | POA: Diagnosis not present

## 2023-08-06 DIAGNOSIS — H524 Presbyopia: Secondary | ICD-10-CM | POA: Diagnosis not present

## 2023-08-06 NOTE — Progress Notes (Signed)
 Specialty Pharmacy Refill Coordination Note  Wesley Ho is a 68 y.o. male contacted today regarding refills of specialty medication(s) Emtricitabine -Tenofovir  DF (TRUVADA )   Patient requested Delivery   Delivery date: 08/13/23   Verified address: [address]  [place] [id number]   Medication will be filled on 06.09.25.

## 2023-08-09 DIAGNOSIS — Z7689 Persons encountering health services in other specified circumstances: Secondary | ICD-10-CM | POA: Diagnosis not present

## 2023-08-12 ENCOUNTER — Other Ambulatory Visit: Payer: Self-pay

## 2023-08-15 ENCOUNTER — Other Ambulatory Visit: Payer: Self-pay

## 2023-08-15 ENCOUNTER — Other Ambulatory Visit (HOSPITAL_COMMUNITY): Payer: Self-pay

## 2023-08-19 ENCOUNTER — Other Ambulatory Visit (HOSPITAL_COMMUNITY): Payer: Self-pay

## 2023-08-22 ENCOUNTER — Other Ambulatory Visit (HOSPITAL_COMMUNITY): Payer: Self-pay

## 2023-08-22 MED ORDER — FINASTERIDE 5 MG PO TABS
5.0000 mg | ORAL_TABLET | Freq: Every morning | ORAL | 2 refills | Status: DC
  Filled 2023-08-27: qty 30, 30d supply, fill #0
  Filled 2023-09-23: qty 30, 30d supply, fill #1
  Filled 2023-10-15 – 2023-10-16 (×2): qty 30, 30d supply, fill #2

## 2023-08-23 DIAGNOSIS — E119 Type 2 diabetes mellitus without complications: Secondary | ICD-10-CM | POA: Diagnosis not present

## 2023-08-27 ENCOUNTER — Other Ambulatory Visit: Payer: Self-pay

## 2023-08-27 ENCOUNTER — Other Ambulatory Visit (HOSPITAL_COMMUNITY): Payer: Self-pay

## 2023-08-27 DIAGNOSIS — Z794 Long term (current) use of insulin: Secondary | ICD-10-CM | POA: Diagnosis not present

## 2023-08-27 DIAGNOSIS — Z7984 Long term (current) use of oral hypoglycemic drugs: Secondary | ICD-10-CM | POA: Diagnosis not present

## 2023-08-27 DIAGNOSIS — E1100 Type 2 diabetes mellitus with hyperosmolarity without nonketotic hyperglycemic-hyperosmolar coma (NKHHC): Secondary | ICD-10-CM | POA: Diagnosis not present

## 2023-09-04 ENCOUNTER — Other Ambulatory Visit: Payer: Self-pay

## 2023-09-04 ENCOUNTER — Other Ambulatory Visit (HOSPITAL_COMMUNITY): Payer: Self-pay

## 2023-09-04 NOTE — Progress Notes (Signed)
 Specialty Pharmacy Refill Coordination Note  Spoke with   [name], [name]    Wesley Ho is a 69 y.o. male contacted today regarding refills of specialty medication(s) Emtricitabine -Tenofovir  DF (TRUVADA )  Patient requested: Delivery   Delivery date: 09/10/23   Verified address: [address]  [place] [address]  Medication will be filled on 09/09/23.

## 2023-09-05 ENCOUNTER — Other Ambulatory Visit: Payer: Self-pay

## 2023-09-10 DIAGNOSIS — Z7985 Long-term (current) use of injectable non-insulin antidiabetic drugs: Secondary | ICD-10-CM | POA: Diagnosis not present

## 2023-09-10 DIAGNOSIS — E1100 Type 2 diabetes mellitus with hyperosmolarity without nonketotic hyperglycemic-hyperosmolar coma (NKHHC): Secondary | ICD-10-CM | POA: Diagnosis not present

## 2023-09-11 ENCOUNTER — Telehealth: Payer: Self-pay | Admitting: "Endocrinology

## 2023-09-11 NOTE — Telephone Encounter (Signed)
 FYI

## 2023-09-11 NOTE — Telephone Encounter (Signed)
 Patient is upset that all of a sudden he is being charged for his CGM. He said he is not authorizing it anymore for Dr Lenis to run his reports. I said that was fine then he should prick his finger and he said he is not doing that either. He asked to speak directly with Dr Lenis. Patient was advised Dr Lenis is seeing patients and a message would be given to him. He was also advised multiple times that Dr Lenis charges to interpret the CGM. Patient said he is not paying the bill

## 2023-09-12 ENCOUNTER — Other Ambulatory Visit: Payer: Self-pay

## 2023-09-16 ENCOUNTER — Other Ambulatory Visit (HOSPITAL_COMMUNITY): Payer: Self-pay

## 2023-09-16 ENCOUNTER — Other Ambulatory Visit: Payer: Self-pay

## 2023-09-16 MED ORDER — ALPRAZOLAM 1 MG PO TABS
1.0000 mg | ORAL_TABLET | Freq: Four times a day (QID) | ORAL | 3 refills | Status: DC | PRN
  Filled 2023-09-16: qty 90, 23d supply, fill #0
  Filled 2023-10-18: qty 90, 23d supply, fill #1
  Filled 2023-11-11: qty 90, 23d supply, fill #2
  Filled 2023-12-12: qty 90, 23d supply, fill #3

## 2023-09-23 ENCOUNTER — Other Ambulatory Visit (HOSPITAL_COMMUNITY): Payer: Self-pay

## 2023-09-23 ENCOUNTER — Other Ambulatory Visit: Payer: Self-pay

## 2023-09-23 MED ORDER — MONTELUKAST SODIUM 10 MG PO TABS
10.0000 mg | ORAL_TABLET | Freq: Every morning | ORAL | 2 refills | Status: DC
  Filled 2023-09-23 – 2023-10-08 (×2): qty 30, 30d supply, fill #0
  Filled 2023-11-07 – 2023-11-12 (×2): qty 30, 30d supply, fill #1
  Filled 2023-12-10 – 2023-12-12 (×2): qty 30, 30d supply, fill #2

## 2023-09-23 MED ORDER — OMEGA-3-ACID ETHYL ESTERS 1 G PO CAPS
2.0000 g | ORAL_CAPSULE | Freq: Every morning | ORAL | 2 refills | Status: DC
  Filled 2023-10-08: qty 60, 30d supply, fill #0
  Filled 2023-11-07 – 2023-11-12 (×2): qty 60, 30d supply, fill #1
  Filled 2023-12-10 – 2023-12-12 (×2): qty 60, 30d supply, fill #2

## 2023-09-23 MED ORDER — CHLORTHALIDONE 25 MG PO TABS
25.0000 mg | ORAL_TABLET | Freq: Every morning | ORAL | 2 refills | Status: DC
  Filled 2023-10-08: qty 30, 30d supply, fill #0
  Filled 2023-11-07 – 2023-11-12 (×2): qty 30, 30d supply, fill #1
  Filled 2023-12-10 – 2023-12-12 (×2): qty 30, 30d supply, fill #2

## 2023-09-23 MED ORDER — DULOXETINE HCL 30 MG PO CPEP
30.0000 mg | ORAL_CAPSULE | Freq: Two times a day (BID) | ORAL | 2 refills | Status: DC
  Filled 2023-10-08: qty 60, 30d supply, fill #0
  Filled 2023-11-07 – 2023-11-12 (×2): qty 60, 30d supply, fill #1
  Filled 2023-12-10 – 2023-12-12 (×2): qty 60, 30d supply, fill #2

## 2023-09-23 MED ORDER — ATORVASTATIN CALCIUM 80 MG PO TABS
80.0000 mg | ORAL_TABLET | Freq: Every day | ORAL | 2 refills | Status: DC
  Filled 2023-09-23 – 2023-10-08 (×2): qty 30, 30d supply, fill #0
  Filled 2023-11-07 – 2023-11-12 (×2): qty 30, 30d supply, fill #1
  Filled 2023-12-10 – 2023-12-12 (×2): qty 30, 30d supply, fill #2

## 2023-09-23 MED ORDER — BUSPIRONE HCL 15 MG PO TABS
15.0000 mg | ORAL_TABLET | Freq: Three times a day (TID) | ORAL | 2 refills | Status: DC
  Filled 2023-10-08: qty 90, 30d supply, fill #0
  Filled 2023-11-07 – 2023-11-12 (×2): qty 90, 30d supply, fill #1
  Filled 2023-12-10 – 2023-12-12 (×2): qty 90, 30d supply, fill #2

## 2023-09-23 MED ORDER — PROPRANOLOL HCL 60 MG PO TABS
60.0000 mg | ORAL_TABLET | Freq: Two times a day (BID) | ORAL | 2 refills | Status: DC
  Filled 2023-10-08: qty 60, 30d supply, fill #0
  Filled 2023-11-07 – 2023-11-12 (×2): qty 60, 30d supply, fill #1
  Filled 2023-12-10 – 2023-12-12 (×2): qty 60, 30d supply, fill #2

## 2023-09-24 ENCOUNTER — Other Ambulatory Visit: Payer: Self-pay

## 2023-09-24 DIAGNOSIS — E1100 Type 2 diabetes mellitus with hyperosmolarity without nonketotic hyperglycemic-hyperosmolar coma (NKHHC): Secondary | ICD-10-CM | POA: Diagnosis not present

## 2023-09-26 ENCOUNTER — Other Ambulatory Visit: Payer: Self-pay

## 2023-10-07 ENCOUNTER — Other Ambulatory Visit: Payer: Self-pay

## 2023-10-07 ENCOUNTER — Other Ambulatory Visit (HOSPITAL_COMMUNITY): Payer: Self-pay

## 2023-10-07 NOTE — Progress Notes (Signed)
 Specialty Pharmacy Refill Coordination Note  Wesley Ho is a 68 y.o. male contacted today regarding refills of specialty medication(s) Emtricitabine -Tenofovir  DF (TRUVADA )   Patient requested Delivery   Delivery date: 10/11/23   Verified address: [address]  [place] [address]   Medication will be filled on 08.07.25.

## 2023-10-08 ENCOUNTER — Other Ambulatory Visit (HOSPITAL_COMMUNITY): Payer: Self-pay

## 2023-10-08 ENCOUNTER — Other Ambulatory Visit (HOSPITAL_BASED_OUTPATIENT_CLINIC_OR_DEPARTMENT_OTHER): Payer: Self-pay

## 2023-10-08 ENCOUNTER — Other Ambulatory Visit: Payer: Self-pay

## 2023-10-08 DIAGNOSIS — E1100 Type 2 diabetes mellitus with hyperosmolarity without nonketotic hyperglycemic-hyperosmolar coma (NKHHC): Secondary | ICD-10-CM | POA: Diagnosis not present

## 2023-10-09 ENCOUNTER — Other Ambulatory Visit: Payer: Self-pay

## 2023-10-10 ENCOUNTER — Other Ambulatory Visit: Payer: Self-pay

## 2023-10-14 DIAGNOSIS — B351 Tinea unguium: Secondary | ICD-10-CM | POA: Diagnosis not present

## 2023-10-14 DIAGNOSIS — E1142 Type 2 diabetes mellitus with diabetic polyneuropathy: Secondary | ICD-10-CM | POA: Diagnosis not present

## 2023-10-14 DIAGNOSIS — M79671 Pain in right foot: Secondary | ICD-10-CM | POA: Diagnosis not present

## 2023-10-14 DIAGNOSIS — M79672 Pain in left foot: Secondary | ICD-10-CM | POA: Diagnosis not present

## 2023-10-15 ENCOUNTER — Other Ambulatory Visit: Payer: Self-pay

## 2023-10-15 ENCOUNTER — Other Ambulatory Visit (HOSPITAL_COMMUNITY): Payer: Self-pay

## 2023-10-17 ENCOUNTER — Other Ambulatory Visit (HOSPITAL_COMMUNITY): Payer: Self-pay | Admitting: Family Medicine

## 2023-10-17 ENCOUNTER — Other Ambulatory Visit (HOSPITAL_COMMUNITY): Payer: Self-pay

## 2023-10-17 ENCOUNTER — Other Ambulatory Visit: Payer: Self-pay

## 2023-10-17 DIAGNOSIS — E1140 Type 2 diabetes mellitus with diabetic neuropathy, unspecified: Secondary | ICD-10-CM | POA: Diagnosis not present

## 2023-10-17 DIAGNOSIS — R911 Solitary pulmonary nodule: Secondary | ICD-10-CM

## 2023-10-17 MED ORDER — PREGABALIN 150 MG PO CAPS
150.0000 mg | ORAL_CAPSULE | Freq: Three times a day (TID) | ORAL | 2 refills | Status: AC
  Filled 2023-10-17: qty 90, 30d supply, fill #0

## 2023-10-18 ENCOUNTER — Other Ambulatory Visit (HOSPITAL_COMMUNITY): Payer: Self-pay

## 2023-10-18 ENCOUNTER — Other Ambulatory Visit: Payer: Self-pay

## 2023-10-18 MED ORDER — FREESTYLE LIBRE 3 SENSOR MISC
3 refills | Status: AC
  Filled 2023-12-03: qty 6, 84d supply, fill #0

## 2023-10-18 MED ORDER — FREESTYLE LIBRE 3 SENSOR MISC
11 refills | Status: DC
  Filled 2023-10-18: qty 2, 28d supply, fill #0
  Filled 2023-11-05 – 2023-11-08 (×2): qty 2, 28d supply, fill #1

## 2023-10-18 MED ORDER — FREESTYLE LIBRE 3 SENSOR MISC: 11 refills | Status: DC

## 2023-10-21 ENCOUNTER — Other Ambulatory Visit (HOSPITAL_COMMUNITY): Payer: Self-pay

## 2023-10-21 ENCOUNTER — Other Ambulatory Visit: Payer: Self-pay

## 2023-10-23 ENCOUNTER — Ambulatory Visit (HOSPITAL_COMMUNITY)
Admission: RE | Admit: 2023-10-23 | Discharge: 2023-10-23 | Disposition: A | Discharge status: Home / Self Care | Source: Ambulatory Visit | Attending: Family Medicine | Admitting: Family Medicine

## 2023-10-23 DIAGNOSIS — R918 Other nonspecific abnormal finding of lung field: Secondary | ICD-10-CM | POA: Diagnosis not present

## 2023-10-23 DIAGNOSIS — Z9289 Personal history of other medical treatment: Secondary | ICD-10-CM | POA: Diagnosis not present

## 2023-10-23 DIAGNOSIS — J439 Emphysema, unspecified: Secondary | ICD-10-CM | POA: Diagnosis not present

## 2023-10-23 DIAGNOSIS — J40 Bronchitis, not specified as acute or chronic: Secondary | ICD-10-CM | POA: Diagnosis not present

## 2023-10-23 DIAGNOSIS — R911 Solitary pulmonary nodule: Secondary | ICD-10-CM | POA: Insufficient documentation

## 2023-10-28 ENCOUNTER — Other Ambulatory Visit (HOSPITAL_COMMUNITY): Payer: Self-pay

## 2023-10-28 ENCOUNTER — Other Ambulatory Visit: Payer: Self-pay

## 2023-10-28 MED ORDER — FINASTERIDE 5 MG PO TABS
5.0000 mg | ORAL_TABLET | Freq: Every morning | ORAL | 2 refills | Status: DC
  Filled 2023-11-20: qty 30, 30d supply, fill #0
  Filled 2023-12-10 – 2023-12-13 (×2): qty 30, 30d supply, fill #1
  Filled 2024-01-13: qty 30, 30d supply, fill #2

## 2023-10-29 ENCOUNTER — Other Ambulatory Visit (HOSPITAL_COMMUNITY): Payer: Self-pay

## 2023-10-29 ENCOUNTER — Other Ambulatory Visit: Payer: Self-pay

## 2023-10-31 ENCOUNTER — Other Ambulatory Visit (HOSPITAL_COMMUNITY): Payer: Self-pay

## 2023-10-31 ENCOUNTER — Other Ambulatory Visit: Payer: Self-pay

## 2023-10-31 MED ORDER — LEVOCETIRIZINE DIHYDROCHLORIDE 5 MG PO TABS
5.0000 mg | ORAL_TABLET | Freq: Every morning | ORAL | 12 refills | Status: AC
  Filled 2023-10-31 – 2023-11-12 (×3): qty 30, 30d supply, fill #0
  Filled 2023-12-10 – 2023-12-12 (×2): qty 30, 30d supply, fill #1
  Filled 2024-01-13: qty 30, 30d supply, fill #2
  Filled 2024-02-07: qty 30, 30d supply, fill #3
  Filled 2024-03-12: qty 30, 30d supply, fill #4
  Filled 2024-04-10 (×2): qty 30, 30d supply, fill #5

## 2023-11-01 ENCOUNTER — Other Ambulatory Visit: Payer: Self-pay

## 2023-11-05 ENCOUNTER — Other Ambulatory Visit (HOSPITAL_COMMUNITY): Payer: Self-pay

## 2023-11-05 ENCOUNTER — Other Ambulatory Visit: Payer: Self-pay

## 2023-11-05 NOTE — Progress Notes (Signed)
 Specialty Pharmacy Refill Coordination Note  Wesley Ho is a 68 y.o. male contacted today regarding refills of specialty medication(s) Emtricitabine -Tenofovir  DF (TRUVADA )   Patient requested Delivery   Delivery date: 11/12/23   Verified address: [address]  [place] [address]   Medication will be filled on 09.08.25.

## 2023-11-06 DIAGNOSIS — Z4681 Encounter for fitting and adjustment of insulin pump: Secondary | ICD-10-CM | POA: Diagnosis not present

## 2023-11-06 DIAGNOSIS — Z794 Long term (current) use of insulin: Secondary | ICD-10-CM | POA: Diagnosis not present

## 2023-11-07 ENCOUNTER — Other Ambulatory Visit (HOSPITAL_COMMUNITY): Payer: Self-pay

## 2023-11-07 ENCOUNTER — Other Ambulatory Visit: Payer: Self-pay

## 2023-11-08 ENCOUNTER — Other Ambulatory Visit (HOSPITAL_COMMUNITY): Payer: Self-pay

## 2023-11-08 ENCOUNTER — Other Ambulatory Visit: Payer: Self-pay

## 2023-11-11 ENCOUNTER — Other Ambulatory Visit (HOSPITAL_COMMUNITY): Payer: Self-pay

## 2023-11-11 ENCOUNTER — Other Ambulatory Visit: Payer: Self-pay

## 2023-11-12 ENCOUNTER — Other Ambulatory Visit: Payer: Self-pay

## 2023-11-13 ENCOUNTER — Other Ambulatory Visit: Payer: Self-pay

## 2023-11-13 LAB — LAB REPORT - SCANNED
A1c: 6.7
Albumin, Urine POC: 14
Albumin/Creatinine Ratio, Urine, POC: 5
Calcium: 9.1
Chlamydia, Swab/Urine, PCR: NEGATIVE
Creatinine, POC: 272.4 mg/dL
EGFR: 90
HM HIV Screening: NEGATIVE

## 2023-11-14 ENCOUNTER — Other Ambulatory Visit: Payer: Self-pay

## 2023-11-14 ENCOUNTER — Other Ambulatory Visit (HOSPITAL_COMMUNITY): Payer: Self-pay

## 2023-11-14 DIAGNOSIS — N529 Male erectile dysfunction, unspecified: Secondary | ICD-10-CM | POA: Diagnosis not present

## 2023-11-14 DIAGNOSIS — I10 Essential (primary) hypertension: Secondary | ICD-10-CM | POA: Diagnosis not present

## 2023-11-14 DIAGNOSIS — K7030 Alcoholic cirrhosis of liver without ascites: Secondary | ICD-10-CM | POA: Diagnosis not present

## 2023-11-14 DIAGNOSIS — E875 Hyperkalemia: Secondary | ICD-10-CM | POA: Diagnosis not present

## 2023-11-14 DIAGNOSIS — J439 Emphysema, unspecified: Secondary | ICD-10-CM | POA: Diagnosis not present

## 2023-11-14 DIAGNOSIS — K222 Esophageal obstruction: Secondary | ICD-10-CM | POA: Diagnosis not present

## 2023-11-14 DIAGNOSIS — Z2981 Encounter for HIV pre-exposure prophylaxis: Secondary | ICD-10-CM | POA: Diagnosis not present

## 2023-11-14 DIAGNOSIS — Z23 Encounter for immunization: Secondary | ICD-10-CM | POA: Diagnosis not present

## 2023-11-14 DIAGNOSIS — E782 Mixed hyperlipidemia: Secondary | ICD-10-CM | POA: Diagnosis not present

## 2023-11-14 DIAGNOSIS — E1310 Other specified diabetes mellitus with ketoacidosis without coma: Secondary | ICD-10-CM | POA: Diagnosis not present

## 2023-11-14 DIAGNOSIS — E1340 Other specified diabetes mellitus with diabetic neuropathy, unspecified: Secondary | ICD-10-CM | POA: Diagnosis not present

## 2023-11-14 DIAGNOSIS — E1100 Type 2 diabetes mellitus with hyperosmolarity without nonketotic hyperglycemic-hyperosmolar coma (NKHHC): Secondary | ICD-10-CM | POA: Diagnosis not present

## 2023-11-14 MED ORDER — TADALAFIL 20 MG PO TABS
20.0000 mg | ORAL_TABLET | Freq: Every day | ORAL | 6 refills | Status: AC
  Filled 2023-11-14 – 2023-11-15 (×2): qty 30, 30d supply, fill #0
  Filled 2023-12-10 – 2023-12-12 (×2): qty 30, 30d supply, fill #1
  Filled 2023-12-23 – 2024-01-13 (×2): qty 30, 30d supply, fill #2
  Filled 2024-02-07: qty 30, 30d supply, fill #3
  Filled 2024-03-12: qty 30, 30d supply, fill #4
  Filled 2024-04-10 (×2): qty 30, 30d supply, fill #5

## 2023-11-15 ENCOUNTER — Other Ambulatory Visit: Payer: Self-pay

## 2023-11-15 ENCOUNTER — Other Ambulatory Visit (HOSPITAL_COMMUNITY): Payer: Self-pay

## 2023-11-15 ENCOUNTER — Other Ambulatory Visit (HOSPITAL_BASED_OUTPATIENT_CLINIC_OR_DEPARTMENT_OTHER): Payer: Self-pay

## 2023-11-19 DIAGNOSIS — E1165 Type 2 diabetes mellitus with hyperglycemia: Secondary | ICD-10-CM | POA: Diagnosis not present

## 2023-11-20 ENCOUNTER — Other Ambulatory Visit: Payer: Self-pay

## 2023-11-20 ENCOUNTER — Other Ambulatory Visit (HOSPITAL_COMMUNITY): Payer: Self-pay

## 2023-12-03 ENCOUNTER — Other Ambulatory Visit (HOSPITAL_COMMUNITY): Payer: Self-pay

## 2023-12-03 ENCOUNTER — Other Ambulatory Visit: Payer: Self-pay

## 2023-12-03 DIAGNOSIS — E1100 Type 2 diabetes mellitus with hyperosmolarity without nonketotic hyperglycemic-hyperosmolar coma (NKHHC): Secondary | ICD-10-CM | POA: Diagnosis not present

## 2023-12-03 NOTE — Progress Notes (Signed)
 Specialty Pharmacy Refill Coordination Note  Wesley Ho is a 68 y.o. male contacted today regarding refills of specialty medication(s) Emtricitabine -Tenofovir  DF (TRUVADA )   Patient requested Delivery   Delivery date: 12/06/23   Verified address: [address]  [place] [address]   Medication will be filled on 12/05/23.

## 2023-12-04 ENCOUNTER — Other Ambulatory Visit: Payer: Self-pay

## 2023-12-09 ENCOUNTER — Ambulatory Visit: Admitting: "Endocrinology

## 2023-12-09 ENCOUNTER — Ambulatory Visit: Admitting: Nutrition

## 2023-12-10 ENCOUNTER — Other Ambulatory Visit: Payer: Self-pay

## 2023-12-10 ENCOUNTER — Other Ambulatory Visit (HOSPITAL_COMMUNITY): Payer: Self-pay

## 2023-12-10 DIAGNOSIS — E1165 Type 2 diabetes mellitus with hyperglycemia: Secondary | ICD-10-CM

## 2023-12-11 ENCOUNTER — Ambulatory Visit: Admitting: "Endocrinology

## 2023-12-11 ENCOUNTER — Encounter: Payer: Self-pay | Admitting: "Endocrinology

## 2023-12-11 ENCOUNTER — Other Ambulatory Visit: Payer: Self-pay

## 2023-12-11 ENCOUNTER — Encounter: Attending: "Endocrinology | Admitting: Nutrition

## 2023-12-11 VITALS — BP 98/60 | HR 56 | Ht 68.0 in | Wt 226.6 lb

## 2023-12-11 DIAGNOSIS — E66812 Obesity, class 2: Secondary | ICD-10-CM | POA: Insufficient documentation

## 2023-12-11 DIAGNOSIS — Z6834 Body mass index (BMI) 34.0-34.9, adult: Secondary | ICD-10-CM

## 2023-12-11 DIAGNOSIS — Z794 Long term (current) use of insulin: Secondary | ICD-10-CM | POA: Insufficient documentation

## 2023-12-11 DIAGNOSIS — E1165 Type 2 diabetes mellitus with hyperglycemia: Secondary | ICD-10-CM | POA: Insufficient documentation

## 2023-12-11 DIAGNOSIS — E66811 Obesity, class 1: Secondary | ICD-10-CM | POA: Diagnosis not present

## 2023-12-11 DIAGNOSIS — E782 Mixed hyperlipidemia: Secondary | ICD-10-CM | POA: Diagnosis not present

## 2023-12-11 DIAGNOSIS — E6609 Other obesity due to excess calories: Secondary | ICD-10-CM

## 2023-12-11 DIAGNOSIS — I10 Essential (primary) hypertension: Secondary | ICD-10-CM

## 2023-12-11 MED ORDER — LANTUS SOLOSTAR 100 UNIT/ML ~~LOC~~ SOPN
40.0000 [IU] | PEN_INJECTOR | Freq: Every day | SUBCUTANEOUS | 1 refills | Status: AC
Start: 2023-12-11 — End: ?

## 2023-12-11 NOTE — Patient Instructions (Signed)

## 2023-12-11 NOTE — Progress Notes (Signed)
 12/11/2023, 10:39 AM  Endocrinology follow-up note   Subjective:    Patient ID: Wesley Ho, male    DOB: Jul 31, 1955.  Wesley Ho is being seen in follow-up after he was seen in consultation for management of currently uncontrolled symptomatic diabetes requested by  Shona Norleen PEDLAR, MD.   Past Medical History:  Diagnosis Date   Anxiety    Arthritis    Bronchitis    Chronic diarrhea    Chronic edema of lower extremity 09/28/2010   COPD (chronic obstructive pulmonary disease) (HCC)    Diabetes mellitus without complication (HCC)    Diabetes mellitus, type II (HCC)    DVT (deep venous thrombosis) (HCC) 09/28/2010   Emphysema of lung (HCC)    Fatigue    H/O Thrombocytopenia 09/28/2010   Hypertension    Left radial fracture    Multiple lung nodules 09/28/2010   Neuropathy     Past Surgical History:  Procedure Laterality Date   BIOPSY  05/29/2022   Procedure: BIOPSY;  Surgeon: Eartha Angelia Sieving, MD;  Location: [facility];  Service: Gastroenterology;;   CATARACT EXTRACTION W/PHACO Right 01/17/2016   Procedure: CATARACT EXTRACTION PHACO AND INTRAOCULAR LENS PLACEMENT (IOC);  Surgeon: Oneil Platts, MD;  Location: [facility];  Service: Ophthalmology;  Laterality: Right;  CDE: 4.34   CATARACT EXTRACTION W/PHACO Left 01/31/2016   Procedure: CATARACT EXTRACTION PHACO AND INTRAOCULAR LENS PLACEMENT (IOC);  Surgeon: Oneil Platts, MD;  Location: [facility];  Service: Ophthalmology;  Laterality: Left;  CDE: 5.69   CHOLECYSTECTOMY     ESOPHAGEAL DILATION N/A 05/29/2022   Procedure: ESOPHAGEAL DILATION;  Surgeon: Eartha Angelia Sieving, MD;  Location: [facility];  Service: Gastroenterology;  Laterality: N/A;   ESOPHAGOGASTRODUODENOSCOPY (EGD) WITH PROPOFOL  N/A 05/29/2022   Procedure: ESOPHAGOGASTRODUODENOSCOPY (EGD) WITH PROPOFOL ;  Surgeon: Eartha Angelia Sieving, MD;  Location: [facility];  Service: Gastroenterology;  Laterality: N/A;  10:30 am, asa 3   MOUTH SURGERY      Social History   Socioeconomic History   Marital status: Divorced    Spouse name: Not on file   Number of children: 2   Years of education: Not on file   Highest education level: Not on file  Occupational History   Occupation: Retired  Tobacco Use   Smoking status: Former    Current packs/day: 0.00    Average packs/day: 1 pack/day for 40.0 years (40.0 ttl pk-yrs)    Types: Cigarettes    Start date: 01/11/1973    Quit date: 01/11/2013    Years since quitting: 10.9   Smokeless tobacco: Never  Vaping Use   Vaping status: Never Used  Substance and Sexual Activity   Alcohol use: No    Comment: stopped drinking 2021   Drug use: No   Sexual activity: Not Currently    Birth control/protection: None  Other Topics Concern   Not on file  Social History Narrative   Not on file   Social Drivers of Health   Financial Resource Strain: Not on file  Food Insecurity: No Food Insecurity (04/03/2022)   Hunger Vital Sign  Worried About Programme researcher, broadcasting/film/video in the Last Year: Never true    Ran Out of Food in the Last Year: Never true  Transportation Needs: No Transportation Needs (04/03/2022)   PRAPARE - Administrator, Civil Service (Medical): No    Lack of Transportation (Non-Medical): No  Physical Activity: Not on file  Stress: Not on file  Social Connections: Not on file    Family History  Problem Relation Age of Onset   Diabetes Mother    Clotting disorder Father    Alcohol abuse Maternal Uncle    Lung cancer Maternal Grandfather    Emphysema Paternal Grandfather     Outpatient Encounter Medications as of 12/11/2023  Medication Sig   acetaminophen  (TYLENOL ) 500 MG tablet Take 500-1,000 mg by mouth every 6 (six) hours as needed for mild pain.   albuterol  (VENTOLIN  HFA) 108 (90 Base) MCG/ACT inhaler Inhale 1 puff into the lungs by mouth every 4 - 6 hours as needed    ALPRAZolam  (XANAX ) 1 MG tablet Take 1 mg by mouth every 6 (six) hours as needed for anxiety.   ALPRAZolam  (XANAX ) 1 MG tablet Take 1 tablet (1 mg total) by mouth every 6 (six) hours as needed.   ALPRAZolam  (XANAX ) 1 MG tablet Take 1 tablet (1 mg total) by mouth every 6 (six) hours as needed for anxiety   ALPRAZolam  (XANAX ) 1 MG tablet Take 1 tablet (1 mg total) by mouth every 6 (six) hours as needed for anxiety   atorvastatin  (LIPITOR) 80 MG tablet Take 1 tablet (80 mg total) by mouth daily.   Blood Glucose Monitoring Suppl DEVI 1 each by Does not apply route in the morning, at noon, and at bedtime. May substitute to any manufacturer covered by patient's insurance.   Budeson-Glycopyrrol-Formoterol  (BREZTRI  AEROSPHERE) 160-9-4.8 MCG/ACT AERO Inhale 2 puffs into the lungs 2 (two) times daily.   busPIRone  (BUSPAR ) 15 MG tablet Take 1 tablet (15 mg total) by mouth 3 (three) times daily. (Patient taking differently: Take 15 mg by mouth 2 (two) times daily.)   busPIRone  (BUSPAR ) 15 MG tablet Take 1 tablet (15 mg total) by mouth 3 (three) times daily.   carboxymethylcellulose (REFRESH TEARS) 0.5 % SOLN Place 1 drop into both eyes 2 (two) times daily as needed (dry eyes).   chlorthalidone  (HYGROTON ) 25 MG tablet Take 25 mg by mouth every morning.   chlorthalidone  (HYGROTON ) 25 MG tablet Take 1 tablet (25 mg total) by mouth every morning.   Continuous Glucose Sensor (FREESTYLE LIBRE 3 SENSOR) MISC Place 1 sensor on the skin every 14 days. Use to check glucose continuously   Continuous Glucose Sensor (FREESTYLE LIBRE 3 SENSOR) MISC Use to check blood sugar continuously. Change as directed every 14 days.   Continuous Glucose Sensor (FREESTYLE LIBRE 3 SENSOR) MISC Use as directed every 14 (fourteen) days.   Continuous Glucose Sensor (FREESTYLE LIBRE 3 SENSOR) MISC Use every 14 days as directed.   Cyanocobalamin  2500 MCG TABS Take 2,500 mcg by mouth daily.   doxycycline  (VIBRA -TABS) 100 MG tablet Take 2 tablets  (200 mg total) by mouth daily as needed.   doxycycline  (VIBRA -TABS) 100 MG tablet Take 2 tablets (200 mg total) by mouth as needed.   DULoxetine  (CYMBALTA ) 30 MG capsule Take 30 mg by mouth 2 (two) times daily.   DULoxetine  (CYMBALTA ) 30 MG capsule Take 1 capsule (30 mg total) by mouth 2 (two) times daily.   emtricitabine -tenofovir  (TRUVADA ) 200-300 MG tablet Take 1 tablet every day  by oral route   finasteride  (PROSCAR ) 5 MG tablet Take 5 mg by mouth daily.   finasteride  (PROSCAR ) 5 MG tablet Take 1 tablet (5 mg total) by mouth every morning.   furosemide  (LASIX ) 20 MG tablet Take 20 mg by mouth daily.   furosemide  (LASIX ) 20 MG tablet Take 1 tablet (20 mg total) by mouth daily as needed for swelling.   gabapentin  (NEURONTIN ) 300 MG capsule Take 1 capsule (300 mg total) by mouth at bedtime for 5 days, THEN 1 capsule (300 mg total) 2 (two) times daily for 5 days, THEN 1 capsule (300 mg total) 3 (three) times daily.   insulin  glargine (LANTUS  SOLOSTAR) 100 UNIT/ML Solostar Pen Inject 40 Units into the skin at bedtime.   Insulin  Pen Needle (INSUPEN PEN NEEDLES) 32G X 4 MM MISC Use to inject insulin  daily   lactulose  (CHRONULAC ) 10 GM/15ML solution Take 30 mLs (20 g total) by mouth 2 (two) times daily.   levocetirizine (XYZAL ) 5 MG tablet Take 5 mg by mouth every morning.   levocetirizine (XYZAL ) 5 MG tablet Take 1 tablet (5 mg total) by mouth every morning.   lidocaine -prilocaine (EMLA) cream Apply 1 Application topically daily as needed (pain).   montelukast  (SINGULAIR ) 10 MG tablet Take 10 mg by mouth daily.   montelukast  (SINGULAIR ) 10 MG tablet Take 1 tablet (10 mg total) by mouth every morning.   omega-3 acid ethyl esters (LOVAZA ) 1 g capsule Take 2 g by mouth daily.   omega-3 acid ethyl esters (LOVAZA ) 1 g capsule Take 2 capsules (2 g total) by mouth every morning.   omeprazole  (PRILOSEC) 40 MG capsule Take 1 capsule (40 mg total) by mouth 2 (two) times daily.   potassium chloride  SA  (KLOR-CON  M) 20 MEQ tablet Take 1 tablet (20 mEq total) by mouth daily for 5 days. (Patient not taking: Reported on 05/23/2022)   pregabalin  (LYRICA ) 150 MG capsule Take 1 capsule (150 mg total) by mouth 3 (three) times daily.   pregabalin  (LYRICA ) 200 MG capsule Take 200 mg by mouth 2 (two) times daily.   propranolol  (INDERAL ) 60 MG tablet Take 60 mg by mouth 2 (two) times daily.   propranolol  (INDERAL ) 60 MG tablet Take 1 tablet (60 mg total) by mouth 2 (two) times daily.   Semaglutide  (RYBELSUS ) 14 MG TABS Take 1 tablet (14 mg total) by mouth daily.   sucralfate  (CARAFATE ) 1 GM/10ML suspension Take 10 mLs (1 g total) by mouth 4 (four) times daily -  with meals and at bedtime. (Patient not taking: Reported on 08/13/2022)   tadalafil  (CIALIS ) 20 MG tablet Take 1 tablet (20 mg total) by mouth daily.   [DISCONTINUED] insulin  glargine (LANTUS  SOLOSTAR) 100 UNIT/ML Solostar Pen Inject 50 Units into the skin at bedtime.   [DISCONTINUED] metFORMIN  (GLUCOPHAGE ) 1000 MG tablet Take 1 tablet (1,000 mg total) by mouth 2 (two) times daily with a meal. (Patient not taking: Reported on 12/11/2023)   No facility-administered encounter medications on file as of 12/11/2023.    ALLERGIES: No Known Allergies  VACCINATION STATUS: Immunization History  Administered Date(s) Administered   Fluad Quad(high Dose 65+) 12/21/2019   Influenza-Unspecified 11/03/2017   Moderna Covid-19 Fall Seasonal Vaccine 64yrs & older 04/01/2023   Tdap 09/02/2019    Diabetes He presents for his follow-up diabetic visit. He has type 2 diabetes mellitus. Onset time: Patient reports that he was not aware of having diabetes and did he went to the hospital with severe hyperglycemia leading to diabetes ketoacidosis  recently.  He did have A1c of 6.1% consistent with prediabetes in 2016. His disease course has been improving. There are no hypoglycemic associated symptoms. Pertinent negatives for hypoglycemia include no confusion, headaches,  pallor or seizures. Pertinent negatives for diabetes include no chest pain, no fatigue, no polydipsia, no polyphagia, no polyuria and no weakness. There are no hypoglycemic complications. Symptoms are improving. Risk factors for coronary artery disease include diabetes mellitus, dyslipidemia, hypertension, male sex, tobacco exposure, sedentary lifestyle, family history and obesity. Current diabetic treatments: He is on Lantus , metformin  and Rybelsus . His weight is decreasing steadily. He is following a generally unhealthy diet. When asked about meal planning, he reported none. He has not had a previous visit with a dietitian. He never participates in exercise. His home blood glucose trend is fluctuating minimally. His breakfast blood glucose range is generally 110-130 mg/dl. His lunch blood glucose range is generally 110-130 mg/dl. His dinner blood glucose range is generally 110-130 mg/dl. His bedtime blood glucose range is generally 110-130 mg/dl. His overall blood glucose range is 110-130 mg/dl. (He uses his CGM on his phone.  His most recent 14 days average blood glucose 119 mg per DL with glucose variability of 27.8%.  His AGP report shows 90% time in range, 5% level 1 hyperglycemia, 4% hypoglycemia.  His recent A1c was 6.7%, generally improving.  Admittedly, he is late for breakfast and drops blood glucose more mornings than not.  ) An ACE inhibitor/angiotensin II receptor blocker is being taken.  Hypertension This is a chronic problem. The current episode started more than 1 year ago. The problem is controlled. Pertinent negatives include no chest pain, headaches, neck pain, palpitations or shortness of breath. Risk factors for coronary artery disease include obesity, male gender, dyslipidemia, diabetes mellitus, smoking/tobacco exposure and sedentary lifestyle. Past treatments include beta blockers and diuretics. Compliance problems: He has a documented history of medical noncompliance.    Hyperlipidemia This is a chronic problem. Exacerbating diseases include diabetes and obesity. Pertinent negatives include no chest pain, myalgias or shortness of breath. Current antihyperlipidemic treatment includes statins. Risk factors for coronary artery disease include dyslipidemia, diabetes mellitus, hypertension, male sex, a sedentary lifestyle, family history and obesity.     Objective:       12/11/2023    9:53 AM 08/01/2023   10:17 AM 08/01/2023    9:53 AM  Vitals with BMI  Height 5' 8 5' 8 5' 8  Weight 226 lbs 10 oz 233 lbs 233 lbs 10 oz  BMI 34.46 35.44 35.53  Systolic 98  116  Diastolic 60  64  Pulse 56  76    BP 98/60   Pulse (!) 56   Ht 5' 8 (1.727 m)   Wt 226 lb 9.6 oz (102.8 kg)   BMI 34.45 kg/m   Wt Readings from Last 3 Encounters:  12/11/23 226 lb 9.6 oz (102.8 kg)  08/01/23 233 lb (105.7 kg)  08/01/23 233 lb 9.6 oz (106 kg)      CMP ( most recent) CMP     Component Value Date/Time   NA 138 11/09/2022 0000   K 5.1 11/09/2022 0000   CL 95 (A) 11/09/2022 0000   CO2 29 (A) 11/09/2022 0000   GLUCOSE 226 (H) 05/07/2022 1339   BUN 12 07/04/2023 0000   CREATININE 0.9 07/04/2023 0000   CREATININE 0.84 05/07/2022 1339   CALCIUM  9.6 03/20/2023 1528   PROT 8.0 05/07/2022 1339   ALBUMIN 4.2 11/09/2022 0000   AST 20 11/09/2022  0000   ALT 16 11/09/2022 0000   ALKPHOS 85 11/09/2022 0000   BILITOT 0.5 05/07/2022 1339   GFRNONAA 94 07/08/2023 0831   GFRAA >60 09/02/2019 1656     Diabetic Labs (most recent): Lab Results  Component Value Date   HGBA1C 8.2 07/04/2023   HGBA1C 7.4 11/09/2022   HGBA1C 8.4 (A) 08/20/2022     Lab Results  Component Value Date   TSH 0.904 10/30/2016   TSH 2.280 07/07/2014    Lipid Panel     Component Value Date/Time   CHOL 178 07/04/2023 0000   TRIG 139 07/04/2023 0000   HDL 35 07/04/2023 0000   LDLCALC 118 07/04/2023 0000    Assessment & Plan:   1. Type 2 diabetes mellitus with hyperglycemia, with  long-term current use of insulin  (HCC)  - GLORIA LAMBERTSON has currently uncontrolled symptomatic type 2 DM since  68 years of age.  He uses his CGM on his phone.  His most recent 14 days average blood glucose 119 mg per DL with glucose variability of 27.8%.  His AGP report shows 90% time in range, 5% level 1 hyperglycemia, 4% hypoglycemia.  His recent A1c was 6.7%, generally improving.  Admittedly, he is late for breakfast and drops blood glucose more mornings than not.  Recent labs reviewed. - I had a long discussion with him about the possible risk factors and  the pathology behind its diabetes and its complications. -his diabetes is complicated by obesity/sedentary life and he remains at a high risk for more acute and chronic complications which include CAD, CVA, CKD, retinopathy, and neuropathy. These are all discussed in detail with him.  - I discussed all available options of managing his diabetes including de-escalation of medications. I have counseled him on Food as Medicine by adopting a Whole Food , Plant Predominant  ( WFPP) nutrition as recommended by [employer] of Lifestyle Medicine. Patient is encouraged to switch to  unprocessed or minimally processed  complex starch, adequate protein intake (mainly plant source), minimal liquid fat, plenty of fruits, and vegetables. -  he is advised to stick to a routine mealtimes to eat 3 complete meals a day and snack only when necessary ( to snack only to correct hypoglycemia BG <70 day time or <100 at night).    - he acknowledges that there is a room for improvement in his food and drink choices. - Further Specific Suggestion is made for him to avoid simple carbohydrates  from his diet including Cakes, Sweet Desserts, Ice Cream, Soda (diet and regular), Sweet Tea, Candies, Chips, Cookies, Store Bought Juices, Alcohol ,  Artificial Sweeteners,  Coffee Creamer, and Sugar-free Products. This will help patient to have more stable blood glucose  profile and potentially avoid unintended weight gain.  - he will be scheduled with Penny Crumpton, RDN, CDE for individualized diabetes education.  - I have approached him with the following individualized plan to manage  his diabetes and patient agrees:   -He is responding to his current regimen of multiple medications.  In the interest of avoiding inadvertent hypoglycemia, he is advised to lower his Lantus  to 40 units nightly.  He is tolerating the higher dose of Rybelsus , advised to continue Rybelsus  14 mg p.o. once daily at breakfast.  He took himself off of metformin , can stay off of it for now.  There is no contraindication of using metformin  if necessary on subsequent visits. -He will not be considered for prandial insulin  for now.  He  is encouraged to eat breakfast on time.  He is advised to continue to use his CGM continuously.  And he is encouraged to call clinic for hypoglycemia under 70 or hyperglycemia above 200 mg/day.  - Specific targets for  A1c;  LDL, HDL,  and Triglycerides were discussed with the patient.  2) Blood Pressure /Hypertension:  His blood pressure is tightly controlled .he is only blood pressure medication is chlorthalidone  which is entered twice, he may be duplicating.  He is advised to take  chlorthalidone  25 mg p.o. daily, Lasix  as needed.  His next labs will include a.m. cortisol.  3) Lipids/Hyperlipidemia: His previsit labs show lipid panel still not controlled with LDL at 118.  He reports he is more consistent taking his statin.  He is advised to continue atorvastatin  80 mg p.o. nightly.  Side effects and precautions discussed with him.  4)  Weight/Diet:  Body mass index is 34.45 kg/m.  -   clearly complicating his diabetes care.   he is  a candidate for weight loss. I discussed with him the fact that loss of 5 - 10% of his  current body weight will have the most impact on his diabetes management.  The above detailed  ACLM recommendations for nutrition,  exercise, sleep, social life, avoidance of risky substances, the need for restorative sleep   information will also detailed on discharge instructions.  5) Chronic Care/Health Maintenance:  -he  is on Statin medications and  is encouraged to initiate and continue to follow up with Ophthalmology, Dentist,  Podiatrist at least yearly or according to recommendations, and advised to   stay away from smoking. I have recommended yearly flu vaccine and pneumonia vaccine at least every 5 years; moderate intensity exercise for up to 150 minutes weekly; and  sleep for 7- 9 hours a day.  Diabetes Foot Exam: Poor hygiene, diminished MF sensation, diminished DP and PT arterial pulses following with podiatry  - he is  advised to maintain close follow up with Shona Norleen PEDLAR, MD for primary care needs, as well as his other providers for optimal and coordinated care.   I spent  41  minutes in the care of the patient today including review of labs from CMP, Lipids, Thyroid  Function, Hematology (current and previous including abstractions from other facilities); face-to-face time discussing  his blood glucose readings/logs, discussing hypoglycemia and hyperglycemia episodes and symptoms, medications doses, his options of short and long term treatment based on the latest standards of care / guidelines;  discussion about incorporating lifestyle medicine;  and documenting the encounter. Risk reduction counseling performed per USPSTF guidelines to reduce  obesity and cardiovascular risk factors.     Please refer to Patient Instructions for Blood Glucose Monitoring and Insulin /Medications Dosing Guide  in media tab for additional information. Please  also refer to  Patient Self Inventory in the Media  tab for reviewed elements of pertinent patient history.  Wesley Ho participated in the discussions, expressed understanding, and voiced agreement with the above plans.  All questions were answered to his satisfaction. he  is encouraged to contact clinic should he have any questions or concerns prior to his return visit.   Follow up plan: - Return in about 4 months (around 04/12/2024) for Bring Meter/CGM Device/Logs- A1c in Office.  Ranny Earl, MD [facility] [address] [place], [place] [address] Phone: [phone]  Fax: [phone]    12/11/2023, 10:39 AM  This note was partially dictated  with voice recognition software. Similar sounding words can be transcribed inadequately or may not  be corrected upon review.

## 2023-12-11 NOTE — Progress Notes (Signed)
 Medical Nutrition Therapy  Appointment Start time:  1040 Appointment End time:  1100  Primary concerns today: DM Type 2  Referral diagnosis: E11.8 Preferred learning style: Visual  Learning readiness: Ready   NUTRITION ASSESSMENT Follow up DM  Saw Dr. Lenis. Has been eating more vegetable plates from [employer]. Drinking less chocolate milk but still drinking milk. Dexcom shows avg BS is 119 mg/dl, TIR 09%J, 5% low blood sugars. A1C reported to be 6.7%. He tends to get up late and eat a late breakfast. He notes his blood sugar drop in am . On Rybelsus  7 mg. Cravings for sugar is less but still eats out since he doesn't cook at home.  Working on making better food choices. Got a  haircut.  Trying to use less xanax . Going to cut back to 40 unis of Lantus  at night.  Lost 7 lbs.  Wt Readings from Last 3 Encounters:  12/11/23 226 lb 9.6 oz (102.8 kg)  08/01/23 233 lb (105.7 kg)  08/01/23 233 lb 9.6 oz (106 kg)   Ht Readings from Last 3 Encounters:  12/11/23 5' 8 (1.727 m)  08/01/23 5' 8 (1.727 m)  08/01/23 5' 8 (1.727 m)   There is no height or weight on file to calculate BMI. @BMIFA @ Facility age limit for growth %iles is 20 years. Facility age limit for growth %iles is 20 years.   Clinical Medical Hx: DM Type 2, Obesity, Cirrhosis of liver, GERD, HTN, Hyperlipidemia, depression and anxiety Medications: Rybelsus ,  60 Lantus  down from 70,  Labs:        Latest Ref Rng & Units 07/04/2023   12:00 AM 03/20/2023    3:28 PM 11/09/2022   12:00 AM  CMP  BUN 4 - 21 12      11       Creatinine 0.6 - 1.3 0.9      0.9      Sodium 137 - 147   138      Potassium 3.5 - 5.1 mEq/L   5.1      Chloride 99 - 108   95      CO2 13 - 22   29      Calcium    9.6     9.9      Alkaline Phos 25 - 125   85      AST 14 - 40   20      ALT 10 - 40 U/L   16         This result is from an external source.    Notable Signs/Symptoms: Chronic fatigue, neuropathy in feet, increased thirst, blurry  vision  Lifestyle & Dietary Hx Lives by himself  Estimated daily fluid intake: 60 oz Supplements: See chart Sleep: a lot Stress / self-care: his health Current average weekly physical activity: ADL due to neuropathy  24-Hr Dietary Recall Eats 1 meal per day and snacks on candy or chocolate milk Drinks propels. Eats at [facility] for brunch most days. Doesn't cook.   Estimated Energy Needs Calories: 1600 Carbohydrate: 180g Protein: 120g Fat: 44g   NUTRITION DIAGNOSIS  NB-1.1 Food and nutrition-related knowledge deficit As related to Excessive carb, fat and salt intake.  As evidenced by DM Type 2, A1C 13.2%, Obesity.   NUTRITION INTERVENTION  Nutrition education (E-1) on the following topics:  Nutrition and Diabetes education provided on My Plate, CHO counting, meal planning, portion sizes, timing of meals, avoiding snacks between meals unless having a low blood sugar, target ranges  for A1C and blood sugars, signs/symptoms and treatment of hyper/hypoglycemia, monitoring blood sugars, taking medications as prescribed, benefits of exercising 30 minutes per day and prevention of complications of DM.  Lifestyle Medicine  - Whole Food, Plant Predominant Nutrition is highly recommended: Eat Plenty of vegetables, Mushrooms, fruits, Legumes, Whole Grains, Nuts, seeds in lieu of processed meats, processed snacks/pastries red meat, poultry, eggs.    -It is better to avoid simple carbohydrates including: Cakes, Sweet Desserts, Ice Cream, Soda (diet and regular), Sweet Tea, Candies, Chips, Cookies, Store Bought Juices, Alcohol in Excess of  1-2 drinks a day, Lemonade,  Artificial Sweeteners, Doughnuts, Coffee Creamers, Sugar-free Products, etc, etc.  This is not a complete list.....  Exercise: If you are able: 30 -60 minutes a day ,4 days a week, or 150 minutes a week.  The longer the better.  Combine stretch, strength, and aerobic activities.  If you were told in the past that you have high  risk for cardiovascular diseases, you may seek evaluation by your heart doctor prior to initiating moderate to intense exercise programs.   Handouts Provided Include     Learning Style & Readiness for Change Teaching method utilized: Visual & Auditory  Demonstrated degree of understanding via: Teach Back  Barriers to learning/adherence to lifestyle change: None  Goals Established by Pt Goals  Eat breakfast of yogurt and fruit for breakfast Work on downloading [employer] app and order groceries on line [employer] out [employer] for food delivery. Cut out chocolate milk and replace milk Eat breakfast at 8 am, lunch 12-2 and D) 5-7 pm Don't skip meals Need to eat 3 meals per day to prevent hypoglycemia and cravings for sweets.   MONITORING & EVALUATION Dietary intake, weekly physical activity, and blood sugars in 3 month.  Next Steps  Patient is to work on consistent meal planning and whole plant based foods.SABRA

## 2023-12-12 ENCOUNTER — Other Ambulatory Visit: Payer: Self-pay

## 2023-12-12 ENCOUNTER — Other Ambulatory Visit (HOSPITAL_COMMUNITY): Payer: Self-pay

## 2023-12-13 DIAGNOSIS — E1165 Type 2 diabetes mellitus with hyperglycemia: Secondary | ICD-10-CM | POA: Diagnosis not present

## 2023-12-13 DIAGNOSIS — Z794 Long term (current) use of insulin: Secondary | ICD-10-CM | POA: Diagnosis not present

## 2023-12-16 ENCOUNTER — Other Ambulatory Visit: Payer: Self-pay

## 2023-12-18 ENCOUNTER — Encounter (INDEPENDENT_AMBULATORY_CARE_PROVIDER_SITE_OTHER): Payer: Self-pay | Admitting: Gastroenterology

## 2023-12-18 ENCOUNTER — Other Ambulatory Visit (HOSPITAL_COMMUNITY): Payer: Self-pay

## 2023-12-23 ENCOUNTER — Other Ambulatory Visit (HOSPITAL_COMMUNITY): Payer: Self-pay

## 2023-12-23 ENCOUNTER — Other Ambulatory Visit: Payer: Self-pay

## 2023-12-23 ENCOUNTER — Encounter: Payer: Self-pay | Admitting: Nutrition

## 2023-12-23 NOTE — Patient Instructions (Addendum)
 Keep eating more vegetables Don't skip meals Eat breakfast of yogurt and fruit for breakfast Work on downloading [employer] app and order groceries on line CHeck out [employer] for food delivery. Cut out chocolate milk and replace milk Eat breakfast at 8 am, lunch 12-2 and D) 5-7 pm Don't skip meals Need to eat 3 meals per day to prevent hypoglycemia and cravings for sweets.

## 2023-12-24 ENCOUNTER — Other Ambulatory Visit: Payer: Self-pay

## 2023-12-26 ENCOUNTER — Other Ambulatory Visit (HOSPITAL_COMMUNITY): Payer: Self-pay

## 2023-12-26 ENCOUNTER — Other Ambulatory Visit: Payer: Self-pay

## 2023-12-26 NOTE — Progress Notes (Signed)
 Specialty Pharmacy Refill Coordination Note  Wesley Ho is a 68 y.o. male contacted today regarding refills of specialty medication(s) Emtricitabine -Tenofovir  DF (TRUVADA )   Patient requested Delivery   Delivery date: 01/08/24   Verified address: [address]  [place] [address]   Medication will be filled on 01/07/24.

## 2023-12-26 NOTE — Progress Notes (Signed)
 Specialty Pharmacy Ongoing Clinical Assessment Note  Wesley Ho is a 68 y.o. male who is being followed by the specialty pharmacy service for RxSp HIV PrEP   Patient's specialty medication(s) reviewed today: Emtricitabine -Tenofovir  DF (TRUVADA )   Missed doses in the last 4 weeks: 0   Patient/Caregiver did not have any additional questions or concerns.   Therapeutic benefit summary: Patient is achieving benefit   Adverse events/side effects summary: No adverse events/side effects   Patient's therapy is appropriate to: Continue    Goals Addressed             This Visit's Progress    Achieve Undetectable HIV Viral Load < 20   On track    Patient is on track. Patient will maintain adherence and be evaluated at upcoming provider appointment to assess progress.  Most recent HIV screening remained negative on 11/13/23.         Follow up: 12 months  Silvano LOISE Dolly Specialty Pharmacist

## 2023-12-27 ENCOUNTER — Other Ambulatory Visit: Payer: Self-pay

## 2023-12-27 DIAGNOSIS — Z794 Long term (current) use of insulin: Secondary | ICD-10-CM | POA: Diagnosis not present

## 2023-12-27 DIAGNOSIS — E1165 Type 2 diabetes mellitus with hyperglycemia: Secondary | ICD-10-CM | POA: Diagnosis not present

## 2023-12-31 ENCOUNTER — Other Ambulatory Visit: Payer: Self-pay

## 2024-01-07 ENCOUNTER — Other Ambulatory Visit: Payer: Self-pay

## 2024-01-08 ENCOUNTER — Other Ambulatory Visit: Payer: Self-pay

## 2024-01-08 ENCOUNTER — Other Ambulatory Visit (HOSPITAL_COMMUNITY): Payer: Self-pay

## 2024-01-10 DIAGNOSIS — E1165 Type 2 diabetes mellitus with hyperglycemia: Secondary | ICD-10-CM | POA: Diagnosis not present

## 2024-01-10 DIAGNOSIS — Z794 Long term (current) use of insulin: Secondary | ICD-10-CM | POA: Diagnosis not present

## 2024-01-13 ENCOUNTER — Other Ambulatory Visit (HOSPITAL_COMMUNITY): Payer: Self-pay

## 2024-01-13 ENCOUNTER — Other Ambulatory Visit: Payer: Self-pay

## 2024-01-13 DIAGNOSIS — E1142 Type 2 diabetes mellitus with diabetic polyneuropathy: Secondary | ICD-10-CM | POA: Diagnosis not present

## 2024-01-13 DIAGNOSIS — B351 Tinea unguium: Secondary | ICD-10-CM | POA: Diagnosis not present

## 2024-01-13 DIAGNOSIS — M79674 Pain in right toe(s): Secondary | ICD-10-CM | POA: Diagnosis not present

## 2024-01-13 DIAGNOSIS — M79675 Pain in left toe(s): Secondary | ICD-10-CM | POA: Diagnosis not present

## 2024-01-13 MED ORDER — ATORVASTATIN CALCIUM 80 MG PO TABS
80.0000 mg | ORAL_TABLET | Freq: Every day | ORAL | 2 refills | Status: DC
  Filled 2024-01-13: qty 30, 30d supply, fill #0
  Filled 2024-02-07: qty 30, 30d supply, fill #1
  Filled 2024-03-12: qty 30, 30d supply, fill #2

## 2024-01-13 MED ORDER — OMEGA-3-ACID ETHYL ESTERS 1 G PO CAPS
2.0000 g | ORAL_CAPSULE | Freq: Every morning | ORAL | 2 refills | Status: DC
  Filled 2024-01-13: qty 60, 30d supply, fill #0
  Filled 2024-02-07: qty 60, 30d supply, fill #1
  Filled 2024-03-12: qty 60, 30d supply, fill #2

## 2024-01-13 MED ORDER — BUSPIRONE HCL 15 MG PO TABS
15.0000 mg | ORAL_TABLET | Freq: Three times a day (TID) | ORAL | 2 refills | Status: DC
  Filled 2024-01-13: qty 90, 30d supply, fill #0
  Filled 2024-02-07: qty 90, 30d supply, fill #1
  Filled 2024-03-12: qty 90, 30d supply, fill #2

## 2024-01-13 MED ORDER — DULOXETINE HCL 30 MG PO CPEP
30.0000 mg | ORAL_CAPSULE | Freq: Two times a day (BID) | ORAL | 2 refills | Status: DC
  Filled 2024-01-13: qty 60, 30d supply, fill #0
  Filled 2024-02-07: qty 60, 30d supply, fill #1
  Filled 2024-03-12: qty 60, 30d supply, fill #2

## 2024-01-13 MED ORDER — PROPRANOLOL HCL 60 MG PO TABS
60.0000 mg | ORAL_TABLET | Freq: Two times a day (BID) | ORAL | 2 refills | Status: DC
  Filled 2024-01-13: qty 60, 30d supply, fill #0
  Filled 2024-02-07: qty 60, 30d supply, fill #1
  Filled 2024-03-12: qty 60, 30d supply, fill #2

## 2024-01-13 MED ORDER — CHLORTHALIDONE 25 MG PO TABS
25.0000 mg | ORAL_TABLET | Freq: Every morning | ORAL | 1 refills | Status: AC
  Filled 2024-01-13: qty 30, 30d supply, fill #0
  Filled 2024-02-07: qty 30, 30d supply, fill #1
  Filled 2024-03-12: qty 30, 30d supply, fill #2
  Filled 2024-04-10: qty 90, 90d supply, fill #3
  Filled 2024-04-10: qty 30, 30d supply, fill #3

## 2024-01-13 MED ORDER — MONTELUKAST SODIUM 10 MG PO TABS
10.0000 mg | ORAL_TABLET | Freq: Every morning | ORAL | 2 refills | Status: DC
  Filled 2024-01-13: qty 30, 30d supply, fill #0
  Filled 2024-02-07: qty 30, 30d supply, fill #1
  Filled 2024-03-12: qty 30, 30d supply, fill #2

## 2024-01-14 ENCOUNTER — Other Ambulatory Visit: Payer: Self-pay

## 2024-01-15 ENCOUNTER — Other Ambulatory Visit: Payer: Self-pay

## 2024-01-16 ENCOUNTER — Other Ambulatory Visit (HOSPITAL_COMMUNITY): Payer: Self-pay

## 2024-01-16 ENCOUNTER — Other Ambulatory Visit: Payer: Self-pay

## 2024-01-17 ENCOUNTER — Other Ambulatory Visit: Payer: Self-pay

## 2024-01-17 ENCOUNTER — Other Ambulatory Visit (HOSPITAL_COMMUNITY): Payer: Self-pay

## 2024-01-17 ENCOUNTER — Other Ambulatory Visit (HOSPITAL_BASED_OUTPATIENT_CLINIC_OR_DEPARTMENT_OTHER): Payer: Self-pay

## 2024-01-17 MED ORDER — ALPRAZOLAM 1 MG PO TABS
1.0000 mg | ORAL_TABLET | Freq: Four times a day (QID) | ORAL | 0 refills | Status: DC | PRN
  Filled 2024-01-17: qty 90, 23d supply, fill #0

## 2024-01-21 ENCOUNTER — Other Ambulatory Visit: Payer: Self-pay | Admitting: "Endocrinology

## 2024-01-21 ENCOUNTER — Other Ambulatory Visit: Payer: Self-pay

## 2024-01-21 DIAGNOSIS — Z794 Long term (current) use of insulin: Secondary | ICD-10-CM

## 2024-01-22 ENCOUNTER — Other Ambulatory Visit: Payer: Self-pay

## 2024-01-22 ENCOUNTER — Other Ambulatory Visit (HOSPITAL_COMMUNITY): Payer: Self-pay

## 2024-01-22 MED ORDER — EMBECTA PEN NEEDLE NANO 2 GEN 32G X 4 MM MISC
2 refills | Status: AC
  Filled 2024-01-22: qty 100, 100d supply, fill #0

## 2024-01-23 ENCOUNTER — Other Ambulatory Visit: Payer: Self-pay

## 2024-01-24 ENCOUNTER — Other Ambulatory Visit: Payer: Self-pay

## 2024-01-24 ENCOUNTER — Other Ambulatory Visit (HOSPITAL_COMMUNITY): Payer: Self-pay

## 2024-01-24 DIAGNOSIS — E1165 Type 2 diabetes mellitus with hyperglycemia: Secondary | ICD-10-CM | POA: Diagnosis not present

## 2024-01-24 DIAGNOSIS — Z794 Long term (current) use of insulin: Secondary | ICD-10-CM | POA: Diagnosis not present

## 2024-01-24 MED ORDER — FINASTERIDE 5 MG PO TABS
5.0000 mg | ORAL_TABLET | Freq: Every morning | ORAL | 2 refills | Status: AC
  Filled 2024-02-07 – 2024-02-12 (×2): qty 30, 30d supply, fill #0
  Filled 2024-03-12: qty 30, 30d supply, fill #1
  Filled 2024-04-10: qty 30, 30d supply, fill #2

## 2024-01-27 ENCOUNTER — Other Ambulatory Visit (HOSPITAL_COMMUNITY): Payer: Self-pay

## 2024-01-27 ENCOUNTER — Other Ambulatory Visit: Payer: Self-pay

## 2024-01-28 ENCOUNTER — Other Ambulatory Visit: Payer: Self-pay

## 2024-01-29 ENCOUNTER — Other Ambulatory Visit: Payer: Self-pay

## 2024-01-31 ENCOUNTER — Other Ambulatory Visit (HOSPITAL_COMMUNITY): Payer: Self-pay

## 2024-02-03 ENCOUNTER — Telehealth (HOSPITAL_COMMUNITY): Payer: Self-pay | Admitting: Pharmacy Technician

## 2024-02-03 ENCOUNTER — Other Ambulatory Visit: Payer: Self-pay | Admitting: Pharmacy Technician

## 2024-02-03 ENCOUNTER — Other Ambulatory Visit (HOSPITAL_COMMUNITY): Payer: Self-pay

## 2024-02-03 ENCOUNTER — Other Ambulatory Visit: Payer: Self-pay

## 2024-02-03 DIAGNOSIS — E1165 Type 2 diabetes mellitus with hyperglycemia: Secondary | ICD-10-CM

## 2024-02-03 MED ORDER — PEN NEEDLES 32G X 4 MM MISC
2 refills | Status: AC
  Filled 2024-02-03: qty 100, 100d supply, fill #0

## 2024-02-03 NOTE — Progress Notes (Signed)
 Specialty Pharmacy Refill Coordination Note  Wesley Ho is a 68 y.o. male contacted today regarding refills of specialty medication(s) Emtricitabine -Tenofovir  DF (TRUVADA )   Patient requested Delivery   Delivery date: 02/05/24   Verified address: Patient address [address]  [place]   Medication will be filled on: 02/04/24

## 2024-02-03 NOTE — Telephone Encounter (Signed)
 Tried to return pt's call, left a message requesting pt to return call to the office. Rx for generic pen needles 32g x 4mm sent to [facility].

## 2024-02-04 ENCOUNTER — Other Ambulatory Visit (HOSPITAL_COMMUNITY): Payer: Self-pay

## 2024-02-04 ENCOUNTER — Telehealth (HOSPITAL_COMMUNITY): Payer: Self-pay

## 2024-02-04 ENCOUNTER — Telehealth: Payer: Self-pay | Admitting: "Endocrinology

## 2024-02-04 ENCOUNTER — Telehealth: Payer: Self-pay

## 2024-02-04 ENCOUNTER — Other Ambulatory Visit: Payer: Self-pay

## 2024-02-04 NOTE — Telephone Encounter (Signed)
 Pharmacy Patient Advocate Encounter   Received notification from Pt Calls Messages that prior authorization for Embecta Pen needles is required/requested.   Insurance verification completed.   The patient is insured through [employer].   Per test claim: PA required and submitted KEY/EOC/Request #: [id number] due to [facility] is not able to process this request through ePA, please contact the plan at [phone]   I called the number and they told me a PA needed to be submitted by phone and gave me another number to call ([phone]). I called this number to initiate the PA. They told me we would receive a response of either a determination or request for additional information within 72 hrs.  Case ID#: [id number]

## 2024-02-04 NOTE — Telephone Encounter (Signed)
 Pt is stating that he needs a PA for pen needles and is completely out

## 2024-02-05 NOTE — Telephone Encounter (Signed)
 Spoke with Medicare Part D representative, answered questions needed to continue process of prior auth. She stated we would receive a fax regarding determination of authorization.

## 2024-02-05 NOTE — Telephone Encounter (Signed)
 Approval sent to pts chart under media

## 2024-02-06 NOTE — Telephone Encounter (Signed)
 Received fax requesting additional info. Form has been faxed back with chart notes.

## 2024-02-07 ENCOUNTER — Other Ambulatory Visit: Payer: Self-pay

## 2024-02-07 ENCOUNTER — Other Ambulatory Visit (HOSPITAL_COMMUNITY): Payer: Self-pay

## 2024-02-11 ENCOUNTER — Other Ambulatory Visit: Payer: Self-pay

## 2024-02-12 ENCOUNTER — Other Ambulatory Visit (HOSPITAL_COMMUNITY): Payer: Self-pay

## 2024-02-12 ENCOUNTER — Other Ambulatory Visit: Payer: Self-pay

## 2024-02-21 DIAGNOSIS — Z794 Long term (current) use of insulin: Secondary | ICD-10-CM | POA: Diagnosis not present

## 2024-02-21 DIAGNOSIS — E1165 Type 2 diabetes mellitus with hyperglycemia: Secondary | ICD-10-CM | POA: Diagnosis not present

## 2024-02-25 ENCOUNTER — Other Ambulatory Visit: Payer: Self-pay

## 2024-02-28 ENCOUNTER — Other Ambulatory Visit: Payer: Self-pay

## 2024-02-28 ENCOUNTER — Other Ambulatory Visit (HOSPITAL_COMMUNITY): Payer: Self-pay

## 2024-02-28 NOTE — Progress Notes (Signed)
 Specialty Pharmacy Refill Coordination Note  SAHITH NURSE is a 68 y.o. male contacted today regarding refills of specialty medication(s) Emtricitabine -Tenofovir  DF (TRUVADA )   Patient requested Delivery   Delivery date: 03/04/24   Verified address: Patient address [address]  [place]   Medication will be filled on: 03/03/24 This fill date is pending response to refill request from provider. Patient is aware and if they have not received fill by intended date they must follow up with pharmacy.

## 2024-03-01 ENCOUNTER — Other Ambulatory Visit (HOSPITAL_COMMUNITY): Payer: Self-pay

## 2024-03-01 MED ORDER — ALPRAZOLAM 1 MG PO TABS
1.0000 mg | ORAL_TABLET | Freq: Four times a day (QID) | ORAL | 0 refills | Status: DC | PRN
  Filled 2024-03-01: qty 90, 23d supply, fill #0

## 2024-03-02 ENCOUNTER — Other Ambulatory Visit: Payer: Self-pay

## 2024-03-02 MED ORDER — EMTRICITABINE-TENOFOVIR DF 200-300 MG PO TABS
1.0000 | ORAL_TABLET | Freq: Every day | ORAL | 3 refills | Status: AC
  Filled 2024-03-02: qty 30, 30d supply, fill #0
  Filled 2024-03-23 (×2): qty 30, 30d supply, fill #1

## 2024-03-03 ENCOUNTER — Other Ambulatory Visit: Payer: Self-pay

## 2024-03-04 ENCOUNTER — Other Ambulatory Visit (HOSPITAL_COMMUNITY): Payer: Self-pay

## 2024-03-04 MED ORDER — FREESTYLE LIBRE 3 SENSOR MISC
11 refills | Status: AC
  Filled 2024-03-04: qty 6, 84d supply, fill #0

## 2024-03-12 ENCOUNTER — Other Ambulatory Visit: Payer: Self-pay

## 2024-03-13 ENCOUNTER — Other Ambulatory Visit: Payer: Self-pay

## 2024-03-16 ENCOUNTER — Other Ambulatory Visit: Payer: Self-pay

## 2024-03-16 ENCOUNTER — Other Ambulatory Visit (HOSPITAL_COMMUNITY): Payer: Self-pay

## 2024-03-18 ENCOUNTER — Other Ambulatory Visit (HOSPITAL_COMMUNITY): Payer: Self-pay

## 2024-03-18 ENCOUNTER — Other Ambulatory Visit: Payer: Self-pay

## 2024-03-23 ENCOUNTER — Other Ambulatory Visit: Payer: Self-pay

## 2024-03-23 ENCOUNTER — Other Ambulatory Visit (HOSPITAL_COMMUNITY): Payer: Self-pay

## 2024-03-23 NOTE — Progress Notes (Signed)
 Specialty Pharmacy Refill Coordination Note  Wesley Ho is a 69 y.o. male contacted today regarding refills of specialty medication(s) Emtricitabine -Tenofovir  DF (TRUVADA )   Patient requested Delivery   Delivery date: 03/30/24   Verified address: [address]  [place]   Medication will be filled on: 03/27/24

## 2024-03-24 ENCOUNTER — Other Ambulatory Visit: Payer: Self-pay

## 2024-03-25 ENCOUNTER — Other Ambulatory Visit: Payer: Self-pay

## 2024-03-25 ENCOUNTER — Other Ambulatory Visit (HOSPITAL_COMMUNITY): Payer: Self-pay

## 2024-03-25 MED ORDER — ALPRAZOLAM 1 MG PO TABS
1.0000 mg | ORAL_TABLET | Freq: Four times a day (QID) | ORAL | 1 refills | Status: AC | PRN
  Filled 2024-03-25: qty 30, 8d supply, fill #0

## 2024-03-27 ENCOUNTER — Other Ambulatory Visit: Payer: Self-pay

## 2024-04-01 ENCOUNTER — Other Ambulatory Visit: Payer: Self-pay

## 2024-04-10 ENCOUNTER — Other Ambulatory Visit (HOSPITAL_COMMUNITY): Payer: Self-pay

## 2024-04-10 ENCOUNTER — Other Ambulatory Visit: Payer: Self-pay

## 2024-04-10 MED ORDER — MONTELUKAST SODIUM 10 MG PO TABS
10.0000 mg | ORAL_TABLET | Freq: Every morning | ORAL | 2 refills | Status: AC
  Filled 2024-04-10: qty 90, 90d supply, fill #0
  Filled 2024-04-10: qty 30, 30d supply, fill #0

## 2024-04-10 MED ORDER — BUSPIRONE HCL 15 MG PO TABS
15.0000 mg | ORAL_TABLET | Freq: Three times a day (TID) | ORAL | 2 refills | Status: AC
  Filled 2024-04-10 (×2): qty 90, 30d supply, fill #0

## 2024-04-10 MED ORDER — PROPRANOLOL HCL 60 MG PO TABS
60.0000 mg | ORAL_TABLET | Freq: Two times a day (BID) | ORAL | 2 refills | Status: AC
  Filled 2024-04-10: qty 180, 90d supply, fill #0
  Filled 2024-04-10: qty 60, 30d supply, fill #0

## 2024-04-10 MED ORDER — DULOXETINE HCL 30 MG PO CPEP
30.0000 mg | ORAL_CAPSULE | Freq: Two times a day (BID) | ORAL | 2 refills | Status: AC
  Filled 2024-04-10: qty 180, 90d supply, fill #0
  Filled 2024-04-10: qty 60, 30d supply, fill #0

## 2024-04-10 MED ORDER — OMEGA-3-ACID ETHYL ESTERS 1 G PO CAPS
2.0000 g | ORAL_CAPSULE | Freq: Every morning | ORAL | 2 refills | Status: AC
  Filled 2024-04-10: qty 180, 90d supply, fill #0
  Filled 2024-04-10: qty 60, 30d supply, fill #0

## 2024-04-10 MED ORDER — ATORVASTATIN CALCIUM 80 MG PO TABS
80.0000 mg | ORAL_TABLET | Freq: Every day | ORAL | 2 refills | Status: AC
  Filled 2024-04-10: qty 90, 90d supply, fill #0
  Filled 2024-04-10: qty 30, 30d supply, fill #0

## 2024-04-13 ENCOUNTER — Encounter: Admitting: Nutrition

## 2024-04-13 ENCOUNTER — Ambulatory Visit: Admitting: "Endocrinology

## 2024-05-07 ENCOUNTER — Ambulatory Visit: Admitting: "Endocrinology
